# Patient Record
Sex: Male | Born: 1960
Health system: Southern US, Community
[De-identification: ages and names within clinical notes are randomized; demographics above are authoritative.]

## PROBLEM LIST (undated history)

## (undated) DIAGNOSIS — M160 Bilateral primary osteoarthritis of hip: Secondary | ICD-10-CM

## (undated) DIAGNOSIS — D709 Neutropenia, unspecified: Secondary | ICD-10-CM

## (undated) DIAGNOSIS — T7840XA Allergy, unspecified, initial encounter: Secondary | ICD-10-CM

## (undated) DIAGNOSIS — M7061 Trochanteric bursitis, right hip: Secondary | ICD-10-CM

## (undated) DIAGNOSIS — K219 Gastro-esophageal reflux disease without esophagitis: Secondary | ICD-10-CM

## (undated) DIAGNOSIS — M722 Plantar fascial fibromatosis: Secondary | ICD-10-CM

## (undated) DIAGNOSIS — G43909 Migraine, unspecified, not intractable, without status migrainosus: Secondary | ICD-10-CM

## (undated) DIAGNOSIS — M87052 Idiopathic aseptic necrosis of left femur: Secondary | ICD-10-CM

## (undated) DIAGNOSIS — R569 Unspecified convulsions: Secondary | ICD-10-CM

## (undated) DIAGNOSIS — M87051 Idiopathic aseptic necrosis of right femur: Secondary | ICD-10-CM

## (undated) DIAGNOSIS — I639 Cerebral infarction, unspecified: Secondary | ICD-10-CM

## (undated) HISTORY — PX: HERNIA REPAIR: SHX51

## (undated) HISTORY — DX: Plantar fascial fibromatosis: M72.2

## (undated) HISTORY — DX: Unspecified convulsions: R56.9

## (undated) HISTORY — DX: Trochanteric bursitis, right hip: M70.61

## (undated) HISTORY — DX: Idiopathic aseptic necrosis of right femur: M87.052

## (undated) HISTORY — DX: Idiopathic aseptic necrosis of left femur: M87.052

## (undated) HISTORY — DX: Bilateral primary osteoarthritis of hip: M16.0

## (undated) HISTORY — DX: Idiopathic aseptic necrosis of left femur: M87.051

## (undated) HISTORY — DX: Neutropenia, unspecified: D70.9

## (undated) HISTORY — DX: Allergy, unspecified, initial encounter: T78.40XA

## (undated) HISTORY — PX: HIP SURGERY: SHX245

---

## 1998-09-11 ENCOUNTER — Encounter: Admission: RE | Admit: 1998-09-11 | Discharge: 1998-11-22 | Payer: Self-pay

## 1998-12-18 ENCOUNTER — Encounter: Admission: RE | Admit: 1998-12-18 | Discharge: 1999-01-17 | Payer: Self-pay

## 2000-02-18 ENCOUNTER — Encounter: Payer: Self-pay | Admitting: Emergency Medicine

## 2000-02-18 ENCOUNTER — Emergency Department (HOSPITAL_COMMUNITY): Admission: EM | Admit: 2000-02-18 | Discharge: 2000-02-18 | Payer: Self-pay | Admitting: Emergency Medicine

## 2000-03-03 ENCOUNTER — Encounter: Admission: RE | Admit: 2000-03-03 | Discharge: 2000-04-30 | Payer: Self-pay | Admitting: Specialist

## 2006-02-11 ENCOUNTER — Emergency Department (HOSPITAL_COMMUNITY): Admission: EM | Admit: 2006-02-11 | Discharge: 2006-02-11 | Payer: Self-pay | Admitting: Family Medicine

## 2006-02-11 ENCOUNTER — Ambulatory Visit (HOSPITAL_COMMUNITY): Admission: RE | Admit: 2006-02-11 | Discharge: 2006-02-11 | Payer: Self-pay | Admitting: Family Medicine

## 2006-02-11 IMAGING — CR DG ABDOMEN 1V
1 series · 1 of 1 positions shown · non-contrast
Comparison: Report of pelvic film from [DATE].
 ABDOMEN - 1 VIEW:

CLINICAL DATA: Muscle strain and abdominal pain for 8 weeks.

[t abdomen supine]
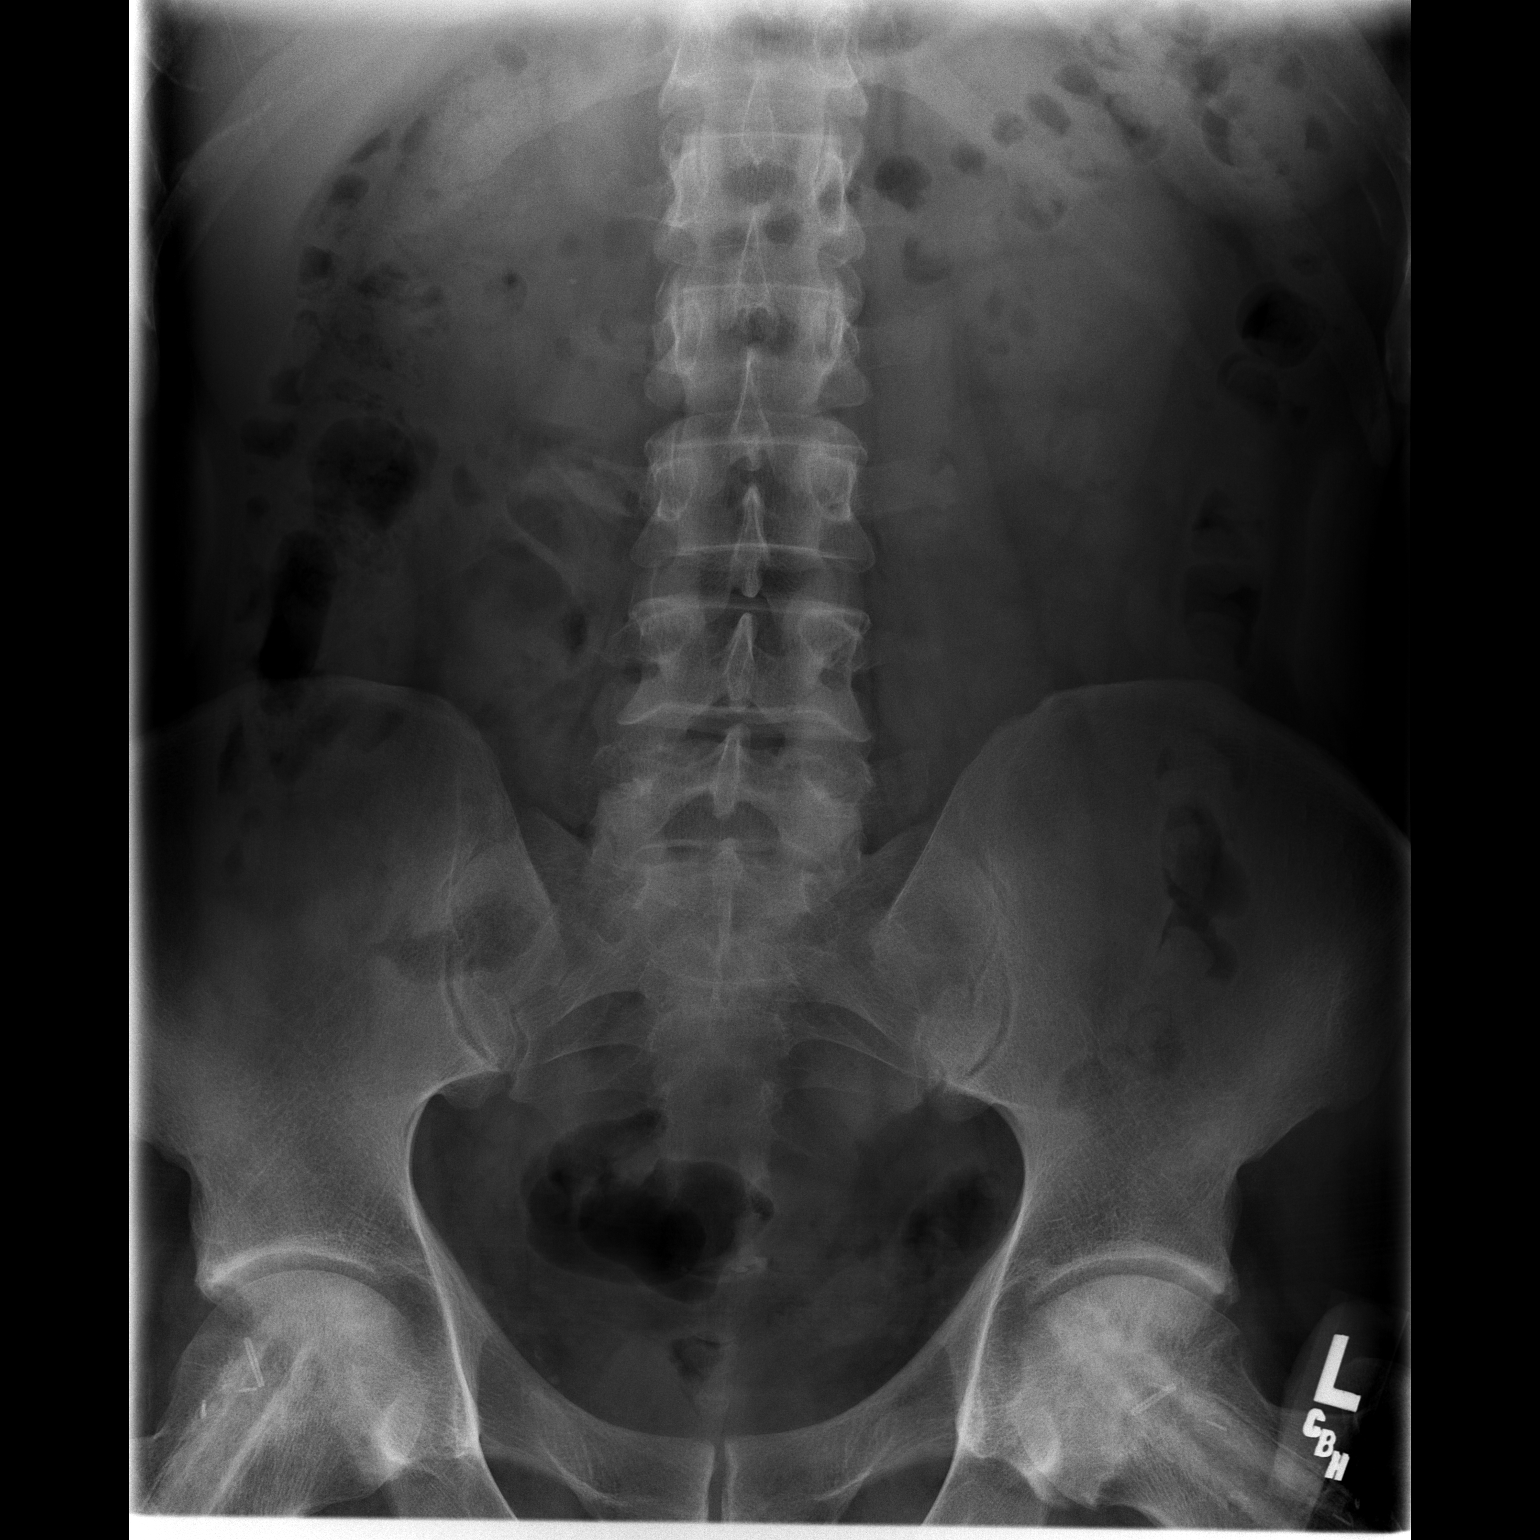

[1 of 1 positions shown; findings below may reference images not displayed]

FINDINGS: Prior surgical changes in the femoral heads bilaterally.  There is increased density in the femoral heads, greater on the left than right, suspicious for avascular necrosis.  No significant collapse identified.  Remainder of osseous structures intact.  Faint calcific density projects over the interpolar right kidney medially, adjacent to the second lumbar interspace.  No other calcific densities over the kidneys or expected course of the ureters.   Bowel gas pattern is within normal limits.
IMPRESSION: 1.  No acute findings.
 2.  Possible punctate stone over the interpolar right kidney.  
 3.  Post surgical changes in the hips with findings suspicious for left greater than right avascular necrosis.

## 2006-02-15 ENCOUNTER — Emergency Department (HOSPITAL_COMMUNITY): Admission: EM | Admit: 2006-02-15 | Discharge: 2006-02-15 | Payer: Self-pay | Admitting: Emergency Medicine

## 2006-03-05 ENCOUNTER — Encounter: Admission: RE | Admit: 2006-03-05 | Discharge: 2006-03-05 | Payer: Self-pay | Admitting: General Surgery

## 2006-03-05 IMAGING — CT CT PELVIS W/ CM
3 of 5 series · 14 of 32 positions shown, 19 images · IV contrast (REDICAT/H2O & OMNIPAQUE [ID])
Comparison: None.

CLINICAL DATA: Abdominal swelling for 12 weeks. 
ABDOMEN CT WITH CONTRAST:
TECHNIQUE: Multidetector CT imaging of the abdomen was performed following the standard protocol during bolus administration of intravenous contrast.
Contrast:  100 cc Omnipaque 300.  Oral contrast was given.
TECHNIQUE: Multidetector CT imaging of the pelvis was performed following the standard protocol during bolus administration of intravenous contrast.

[Series 2: — · axial · 0.70mm/px · z∈[-398,-63]mm · 7 of 91 slices shown, 12 images (1 of 2)]
[im 12/91  soft-tissue]
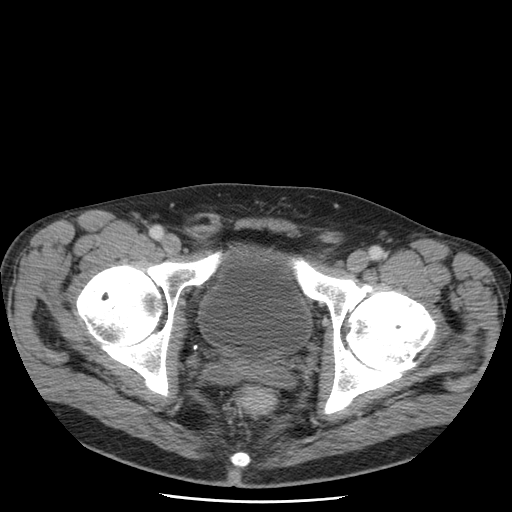
[im 12/91  bone]
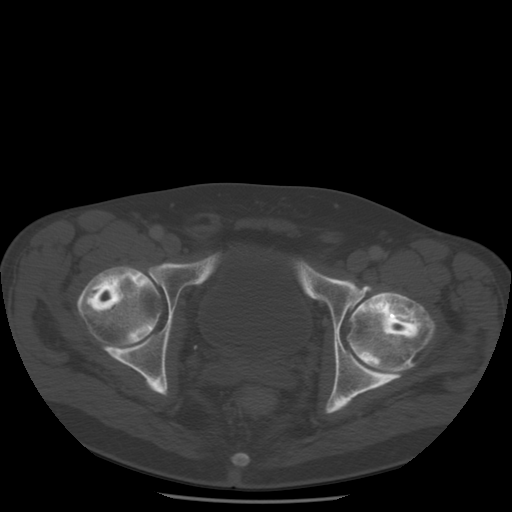
[im 23/91  soft-tissue]
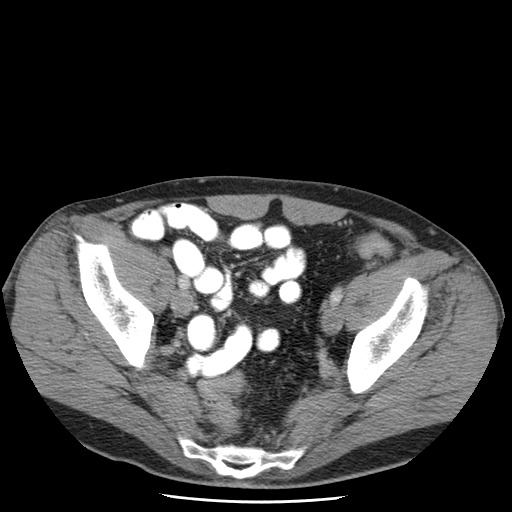
[im 34/91  soft-tissue]
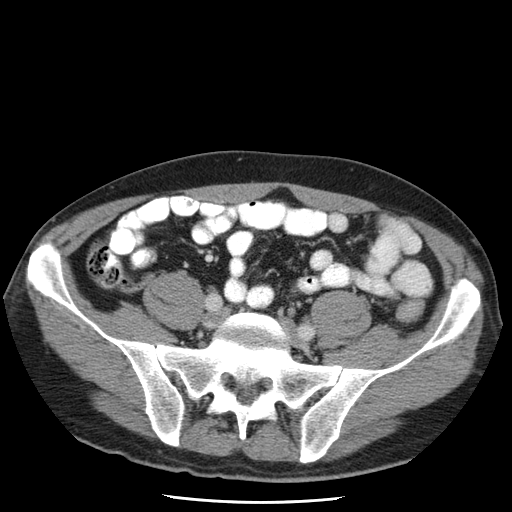
[im 46/91  soft-tissue]
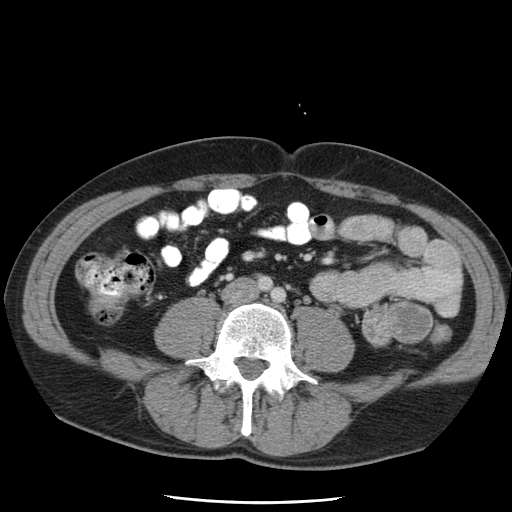
[im 46/91  lung]
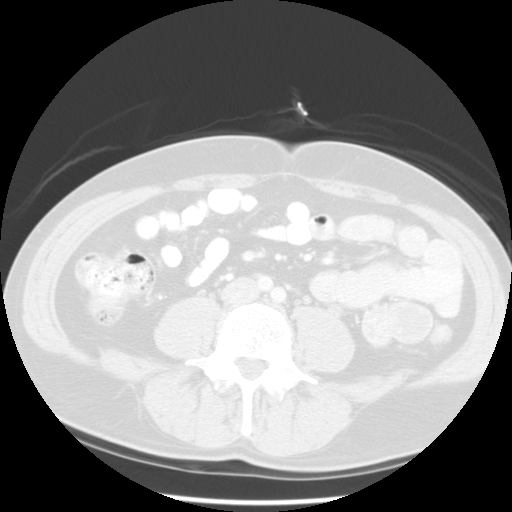
[im 57/91  soft-tissue]
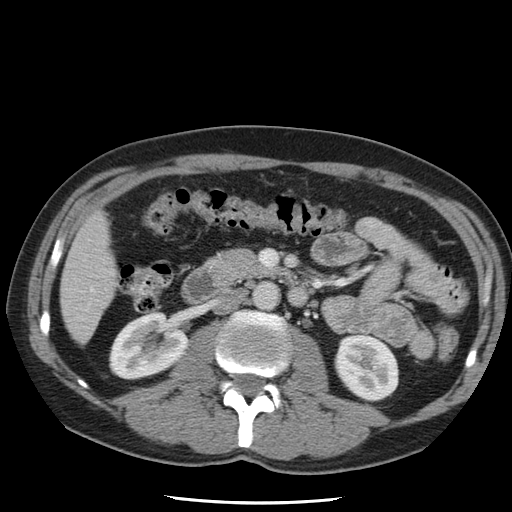
[im 57/91  lung]
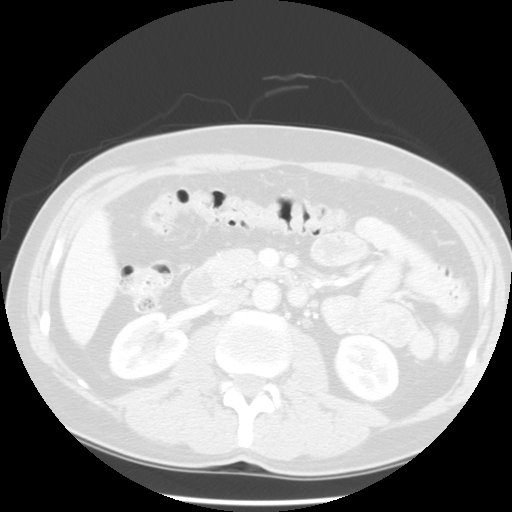
[im 68/91  soft-tissue]
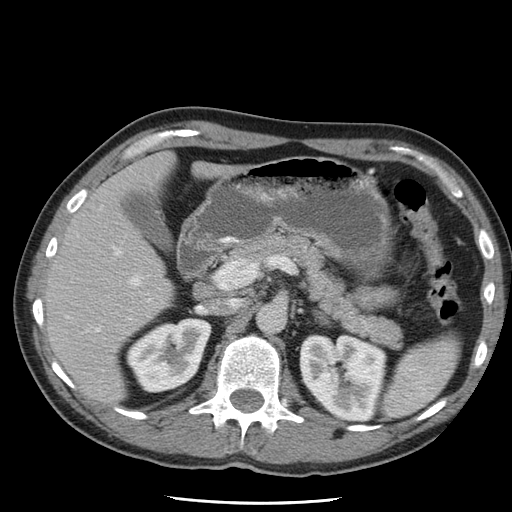
[im 68/91  lung]
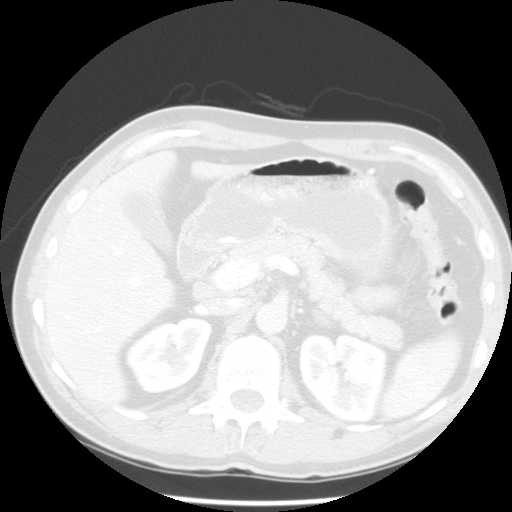
[im 79/91  soft-tissue]
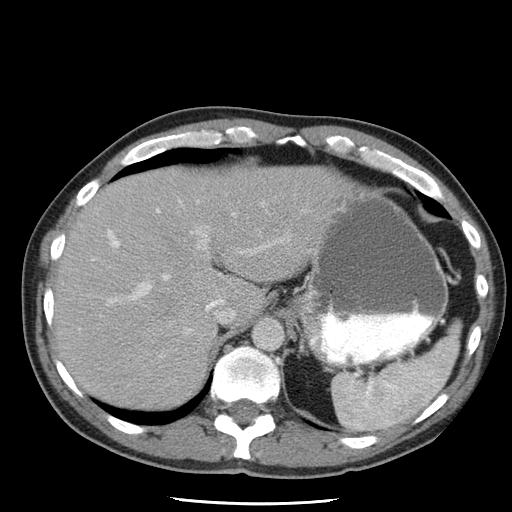
[im 79/91  lung]
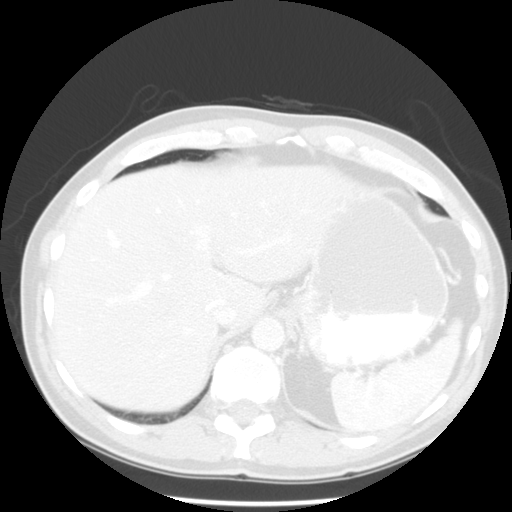

[Series 4: — · axial · 0.70mm/px · z∈[-150,-75]mm · 2 of 46 slices shown (2 of 2)]
[im 16/46  soft-tissue]
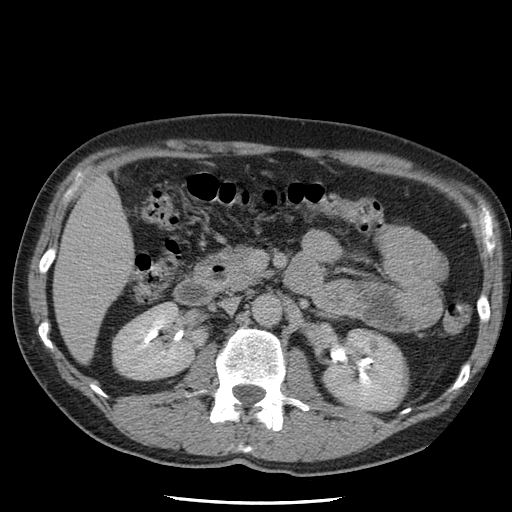
[im 31/46  soft-tissue]
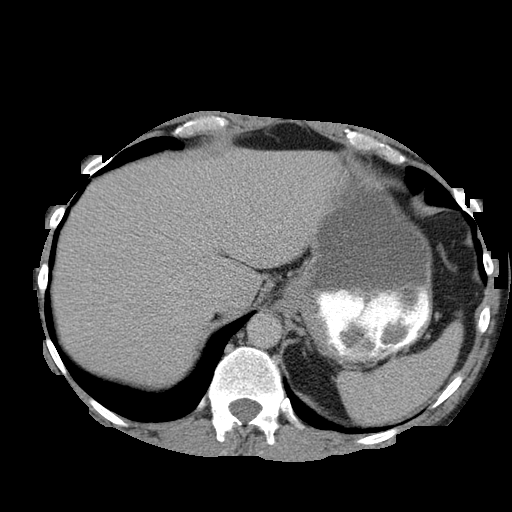

[Series 193: reformatted · sagittal · 0.70mm/px · 5 of 128 slices shown]
[im 12/128  soft-tissue]
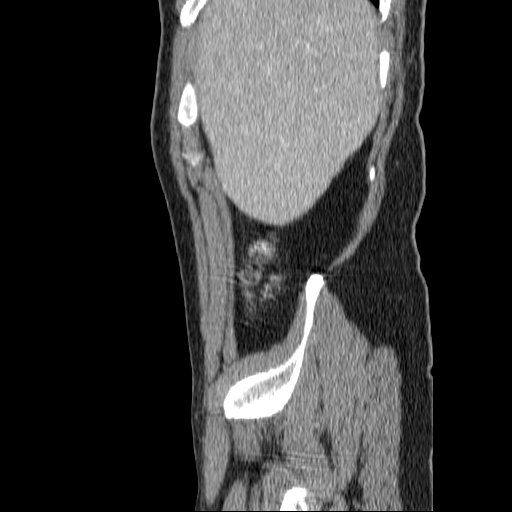
[im 24/128  soft-tissue]
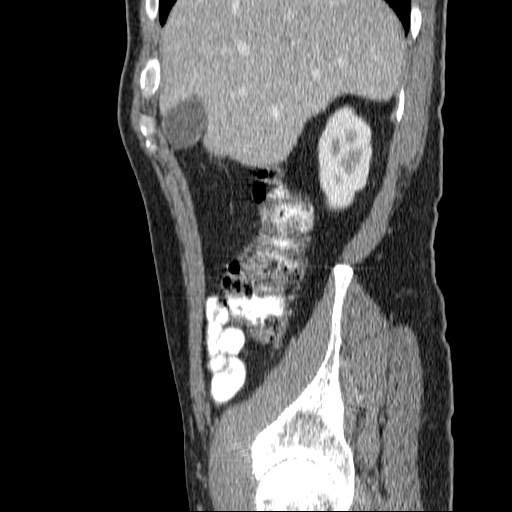
[im 47/128  soft-tissue]
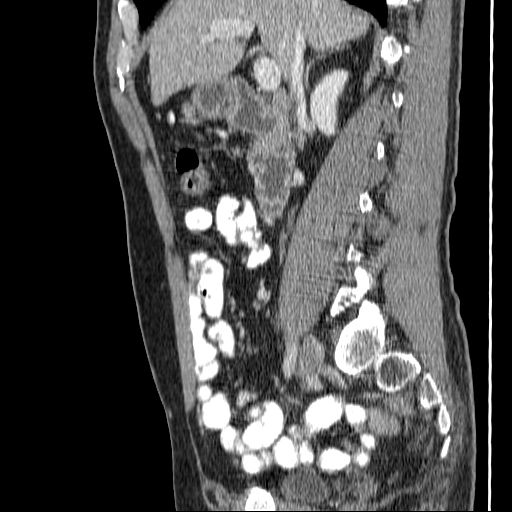
[im 58/128  soft-tissue]
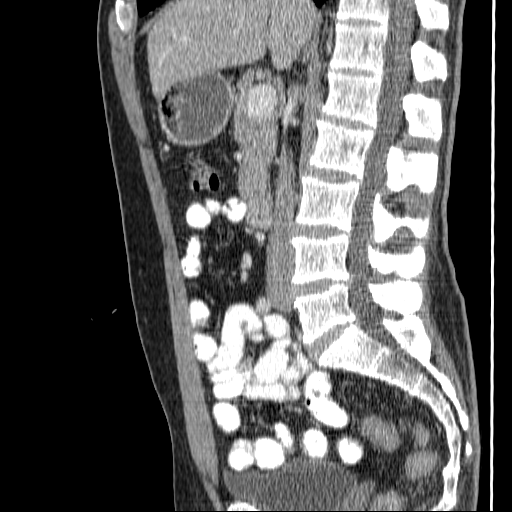
[im 70/128  soft-tissue]
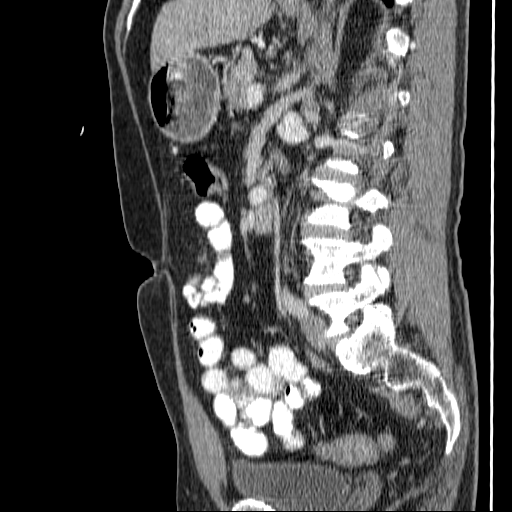

[14 of 32 positions shown; findings below may reference images not displayed]

FINDINGS: The lung bases are clear.  There is no pleural effusion.  The liver appears normal.  The spleen is normal in size without focal abnormality.  The gallbladder, pancreas, adrenal glands, and kidneys appear normal.  No ascites or adenopathy is demonstrated.  The colon is decompressed without evidence of wall thickening and surrounding inflammatory change.  There is no small bowel abnormality.
IMPRESSION: 1.  No explanation for abdominal distention is seen.  There is no bowel dilatation or ascites.  No masses are identified. 
2.  No acute findings. 
PELVIS CT WITH CONTRAST:
FINDINGS: There is no pelvic mass, fluid collection, or inflammatory process.  The prostate gland is mildly enlarged.  There has been apparent free fibular vascular grafting of both femoral heads for osteonecrosis.  No acute osseous findings are seen.
IMPRESSION: No pelvic mass, fluid collection, or inflammatory process.

## 2006-04-09 ENCOUNTER — Ambulatory Visit (HOSPITAL_BASED_OUTPATIENT_CLINIC_OR_DEPARTMENT_OTHER): Admission: RE | Admit: 2006-04-09 | Discharge: 2006-04-09 | Payer: Self-pay | Admitting: General Surgery

## 2008-05-08 ENCOUNTER — Emergency Department (HOSPITAL_COMMUNITY): Admission: EM | Admit: 2008-05-08 | Discharge: 2008-05-08 | Payer: Self-pay | Admitting: Emergency Medicine

## 2008-05-08 IMAGING — CR DG HAND COMPLETE 3+V*L*
3 series · 3 of 3 positions shown · non-contrast
Comparison: None.

CLINICAL DATA: 47-year-old male foreign body palm of the hand.

LEFT HAND - COMPLETE 3+ VIEW

[x hand pa left]
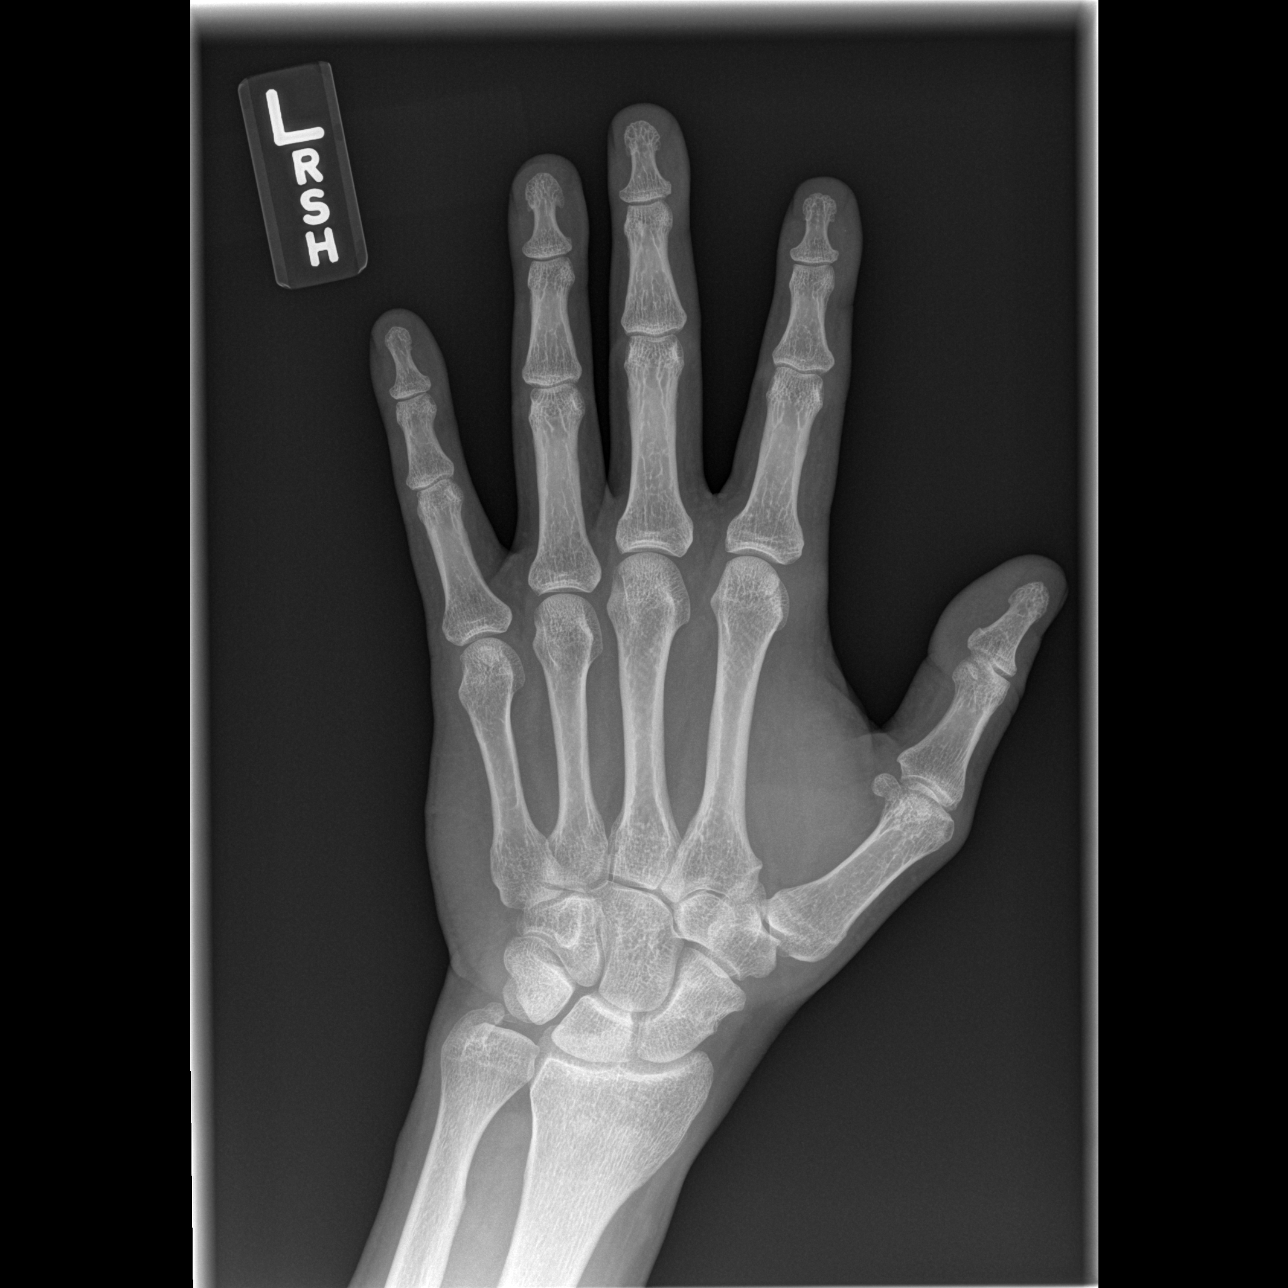

[x hand oblique left]
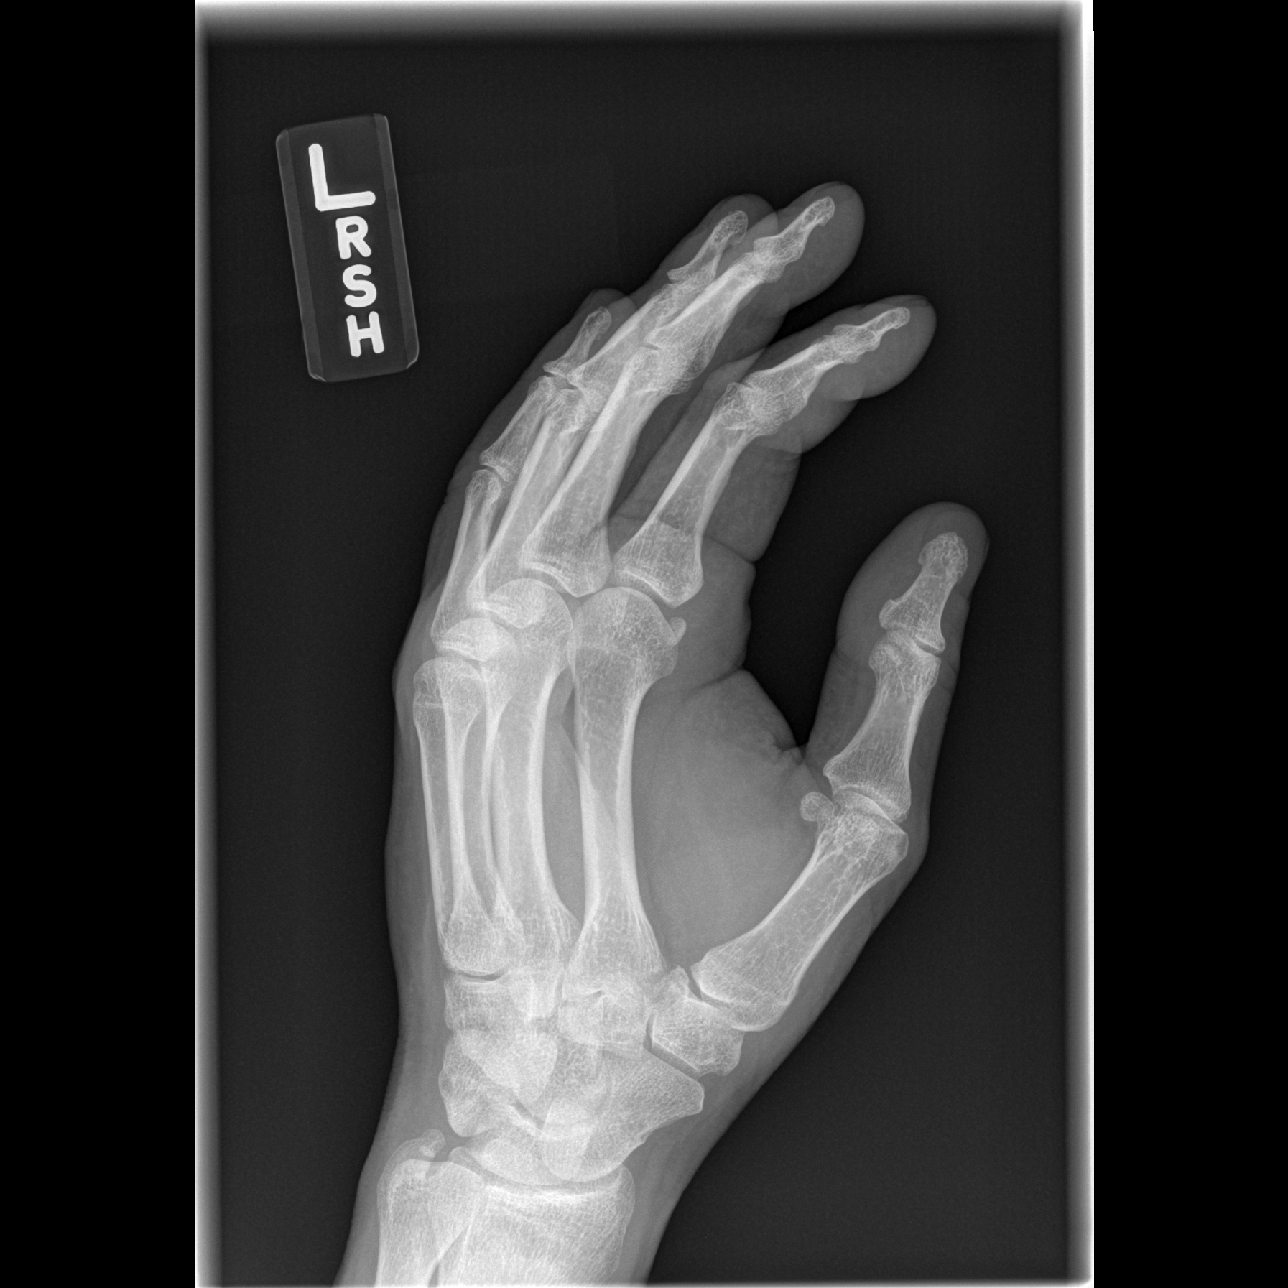

[x hand lat left]
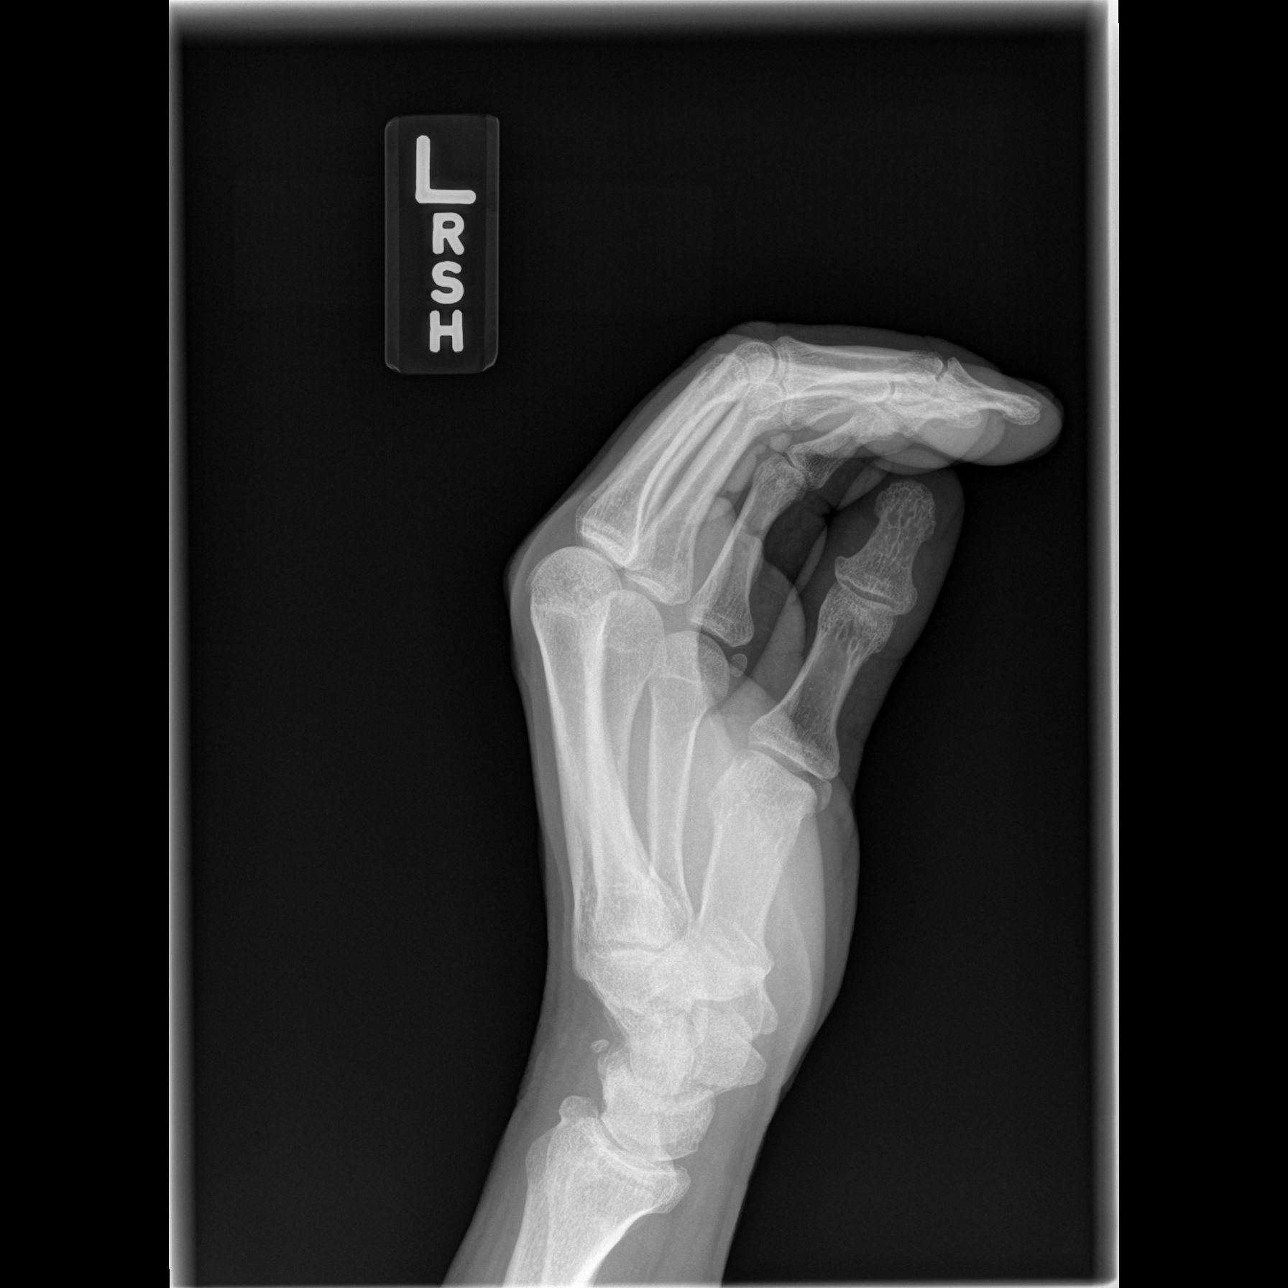

[3 of 3 positions shown; findings below may reference images not displayed]

FINDINGS: Normal alignment without fracture.  No significant
arthritic change.  No definite radiodense foreign body identified
by plain radiography.
IMPRESSION: No acute finding or definite radiodense foreign body by plain
radiography

## 2010-08-26 ENCOUNTER — Encounter: Admission: RE | Admit: 2010-08-26 | Discharge: 2010-08-26 | Payer: Self-pay | Admitting: Emergency Medicine

## 2010-08-26 IMAGING — CT CT TEMPORAL BONES W/O CM
2 of 5 series · 15 of 30 positions shown, 17 images · non-contrast
Comparison: None.

CLINICAL DATA: 49-year-old male with right hearing loss, drainage,
and pain.  Question cholesteatoma, mastoiditis.

CT TEMPORAL BONES WITHOUT CONTRAST
TECHNIQUE: Axial and coronal plane CT imaging of the petrous
temporal bones was performed with thin-collimation image
reconstruction.  No intravenous contrast was administered.
Multiplanar CT image reconstructions were also generated.

[Series 3: ax mag right · axial · 0.19mm/px · z∈[-7,+42]mm · 8 of 203 slices shown, 10 images]
[im 23/203  brain]
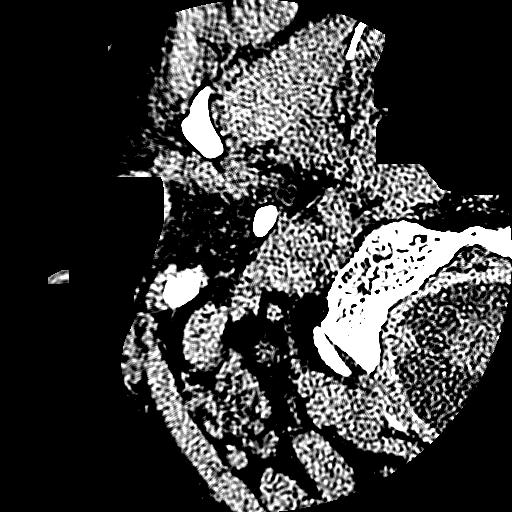
[im 23/203  bone]
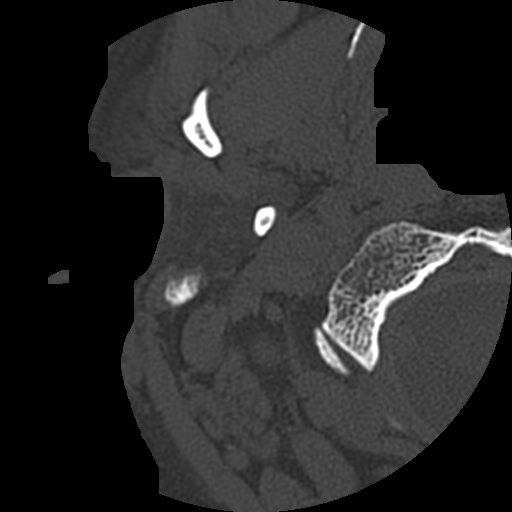
[im 45/203  bone]
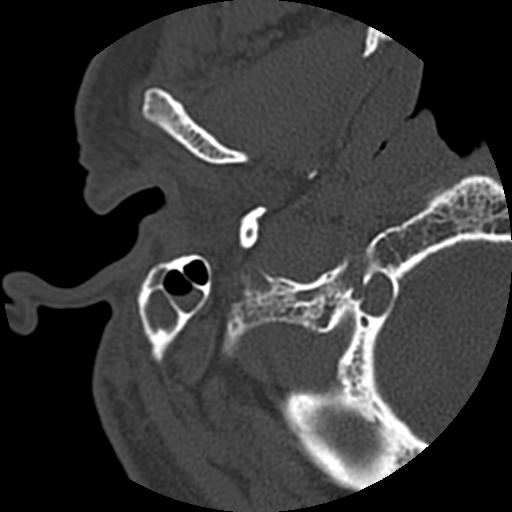
[im 68/203  bone]
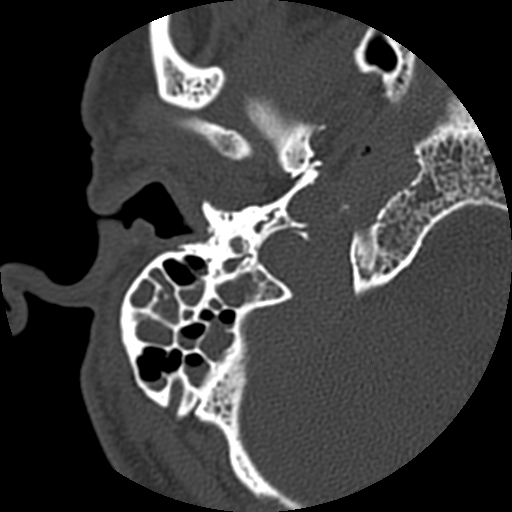
[im 90/203  bone]
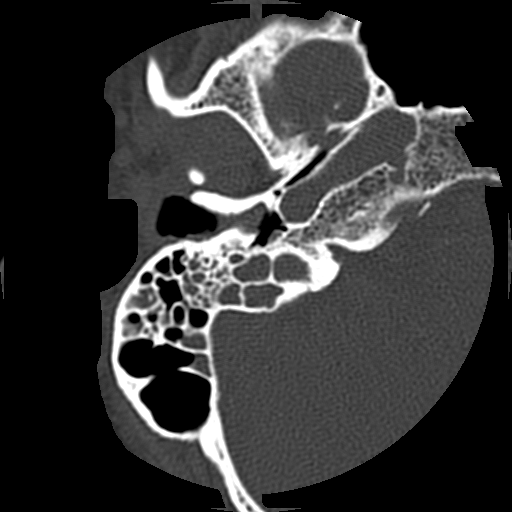
[im 113/203  brain]
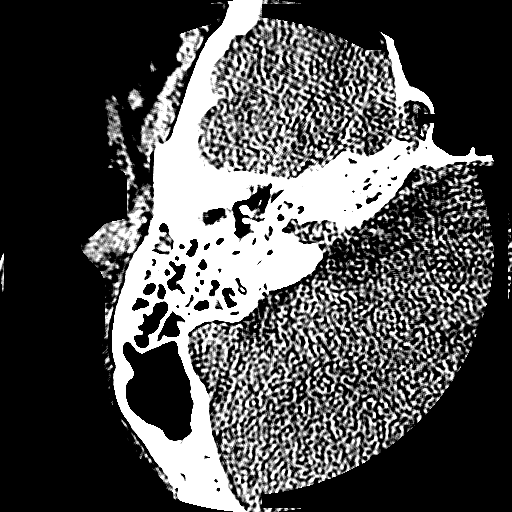
[im 113/203  bone]
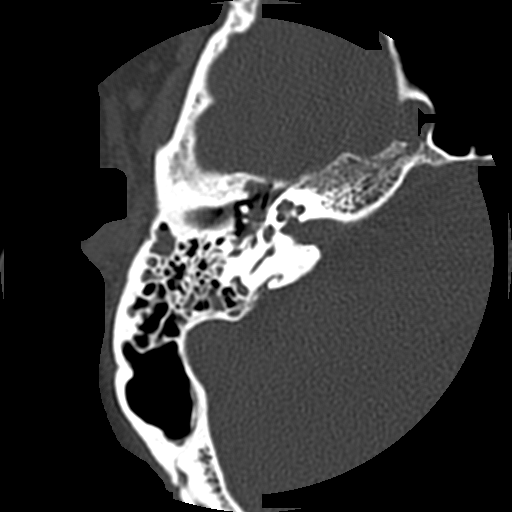
[im 135/203  bone]
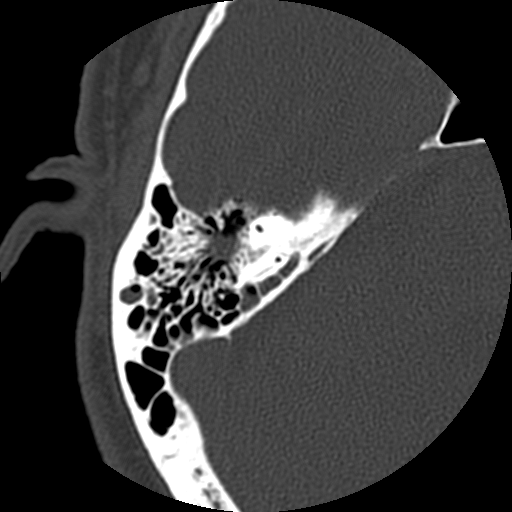
[im 158/203  bone]
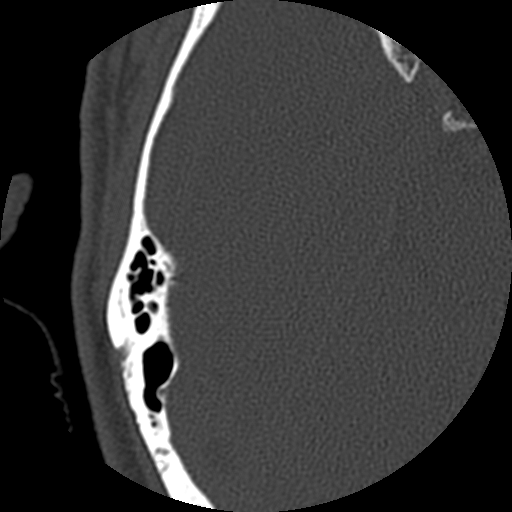
[im 180/203  bone]
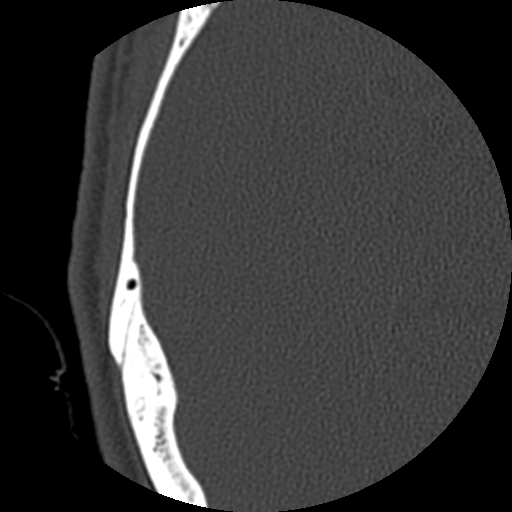

[Series 4: ax mag left · axial · 0.19mm/px · z∈[-6,+42]mm · 7 of 203 slices shown]
[im 26/203  bone]
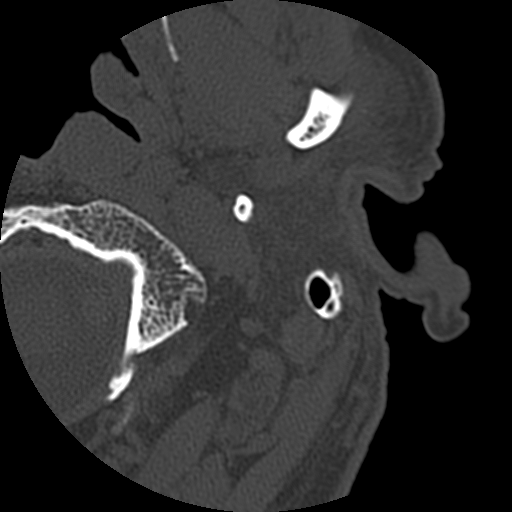
[im 51/203  bone]
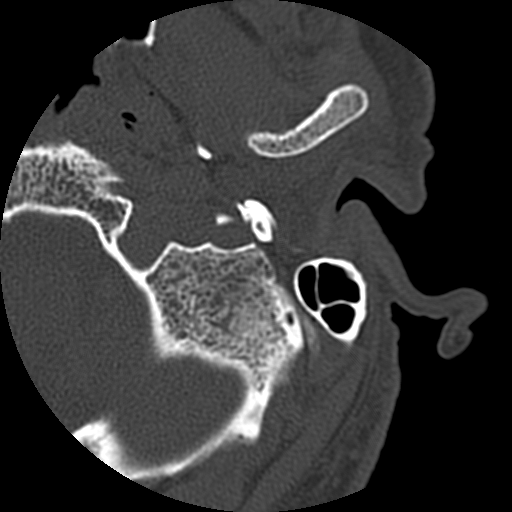
[im 76/203  bone]
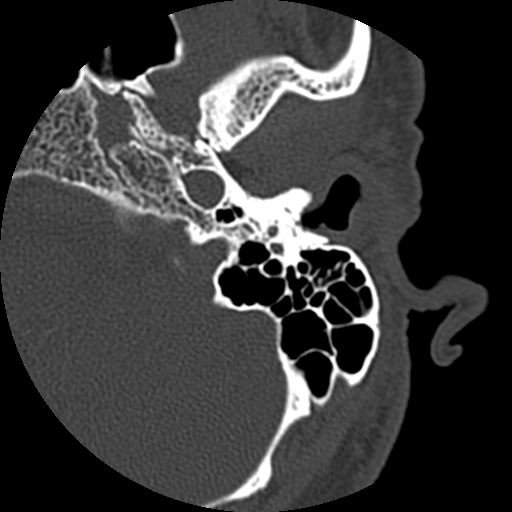
[im 102/203  bone]
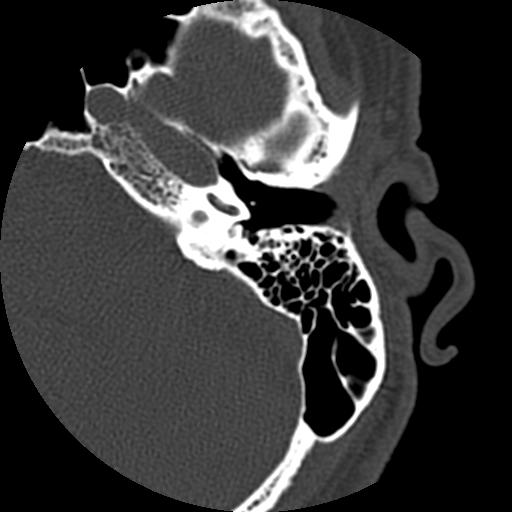
[im 127/203  bone]
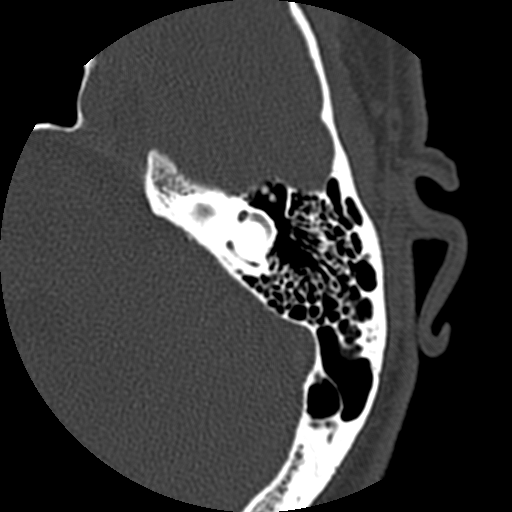
[im 152/203  bone]
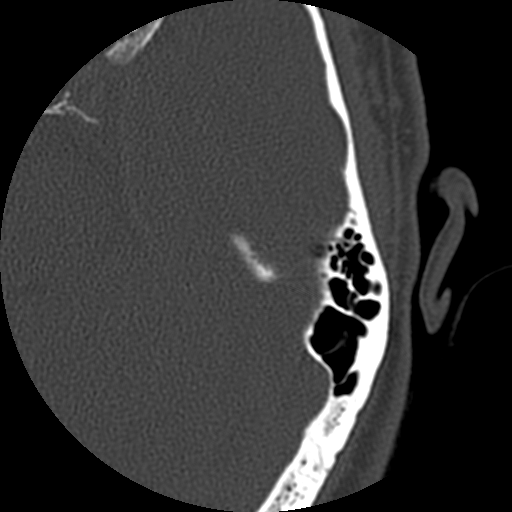
[im 177/203  bone]
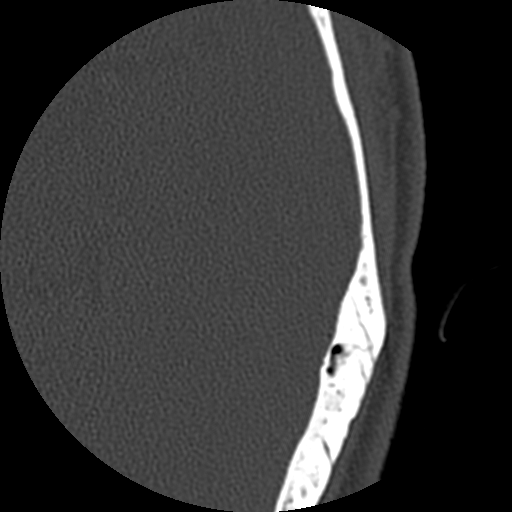

[15 of 30 positions shown; findings below may reference images not displayed]

FINDINGS: Incidental arachnoid granulation associated with the
right transverse sinus.  Visualized noncontrast brain parenchyma
within normal limits.  Visualized deep soft tissue spaces of the
face and orbits are within normal limits.  Visualized paranasal
sinuses are clear.

Left temporal bone:  Left external auditory canal, tympanic
membrane, tympanic cavity, and mastoid air cells are within normal
limits.  The ossicles appear intact, but there is an unusual
lucency at the head of the malleus (series 4 image 122).  The left
internal auditory canal appears bulbous, but similar in
configuration to the opposite side.  The course of the left seventh
nerve, cochlea, vestibule, semicircular canals and vestibular
aqueduct are within normal limits.

Right temporal bone:  There is opacification of the central aspect
of the right external auditory canal.  The right tympanic membrane
is obscured but may be bulging and word.  There is extensive
opacification of the right epitympanum and fluid or soft tissue
tracking medially along the ossicles.  The ossicles appear remain
intact and normally located.  The hypotympanum shows a better
degree of pneumatization.  There is opacification of the aditus ad
antrum and scattered opacification throughout the right mastoids.
Some mastoid air fluid levels are present.  No bony resorption is
identified.  The right sigmoid plate is intact.  The right IAC is
bulbous similar appearance to the left.  The right cochlea,
vestibule, semicircular canals, and vestibular aqueduct are within
normal limits.
IMPRESSION: 1.  Subtotal opacification of the central right external auditory
canal and superior aspect of the right tympanic cavity, obscuring
the right tympanic membrane and involving the right ossicles.
2.  Scattered opacification and fluid levels throughout the right
mastoids.
3.  The findings in #1 and #2 are nonspecific but could reflect
cholesteatoma with associated inflammatory changes or inflammatory
changes alone. Recommend ENT follow-up.
4.  Somewhat bulbous appearance of the bilateral IACs.
5.  Unusual lucency in the head of the left malleus, but otherwise
normal left ossicles and tympanic cavity.

## 2011-04-04 NOTE — Op Note (Signed)
NAMETOSHIRO, HANKEN                ACCOUNT NO.:  [id number]   MEDICAL RECORD NO.:  [id number]          PATIENT TYPE:  AMB   LOCATION:  [facility]                         FACILITY:  [facility]   PHYSICIAN:  Anselm Pancoast. Weatherly, M.D.DATE OF BIRTH:  [birth year]   DATE OF PROCEDURE:  04/09/2006  DATE OF DISCHARGE:                                 OPERATIVE REPORT   PREOPERATIVE DIAGNOSIS:  Right inguinal hernia, probably indirect.   POSTOPERATIVE DIAGNOSIS:  Right inguinal hernia, indirect, with kind of a  weak floor.   OPERATION:  Right inguinal herniorrhaphy with mesh reinforcement.   HISTORY:  Ezekeil Bethel is a 50 year old male with past problems with  arthritis of his hips and he works as a Horticulturist, commercial, walks with a cane  and started having pain in the right groin.  Noticed a bulge.  I saw him  after he had seen in an urgent care and could not see any evidence or feel  any evidence of a hernia at that time.  He had had a CT that showed no  weakness in the inguinal floor area and they had questioned whether or not  he had a kidney stone.  I suggested that we reevaluate him in approximately  4-5 weeks if he is not having any definite further symptoms.  He saw his  regular medical doctor and had no blood in his urine and then he developed a  bulge that would definitely protrude intermittently and I saw him back in  the office this week and could definitely feel an indirect hernia.  It was  fairly easy to reduce and I recommended we repair it with anesthesia at this  time.  The patient is here.  His CBC and EKG and electrolytes were normal.   The patient's groin area was prepped after being clipped with clippers and  previously had been marked.  He had been given a gram of Kefzol and then the  area was prepped with Betadine solution.  The inguinal area was infiltrated  with a mixture of 0.5% plain Xylocaine and 0.25 Marcaine with adrenaline and  then the ilioinguinal nerve area was  infiltrated with about 10 mL of the  same solution with a blunted 22 gauge needle.  A small incision was made.  Sharp dissection through the skin and thin layer of subcutaneous tissue with  two superficial veins were identified, separated or clamped, divided and  ligated with 4-0 Vicryl.  The external oblique aponeurosis was opened  through the external ring and the cord structures were elevated on a  Penrose.  You could see whether there was a little bulge coming down with  the inguinal cord structures.  The floor itself is fairly weak but not  really an obvious symptomatic right indirect inguinal hernia.  I separated  the cremaster and the fibers from the cord structures, identified the hernia  sac, separated it from the cord structures and took it up high, opened the  sac and then a high sac ligation with 0 Surgilon and a second tie just  distally.  Care was taken that  the vessels were not included and then the  sac removed and the high sac ligation retracted up nicely.  The floor itself  I elected to chose this with kind of a modified __________ type starting at  symphysis pubis, reinforcing the shelving edge of the inguinal ligament and  the conjoined tendon area, reinforcing the internal ring and then going  back, suturing the ends together with 2-0 Prolene.  I then took a piece of  mesh.  I made a sail slit laterally and reinforced the floor, sutured it to  the inguinal canal inferior with a running 2-0 Prolene.  The two tails were  sutured together distal to the internal ring area and then the superior flap  was sutured down with interrupted sutures of either 2-0 Prolene or some 2-0  Vicryl.  The ilioinguinal nerve, there appeared to be two, a very small one  with the cord structures that we tried to separate and then a second one  that is quite high.  The mesh is lying just a little bit on top of this  superiorly but I don't think it is causing any irritation to and I was  making  sure that any place we were close to the nerve that the little  tacking suture was a Vicryl instead of a Prolene.  The external oblique was  then closed over the cord structures with a 3-0 running Vicryl.  The mesh  was lying flat, not on excessive tension and I used about 10 mL of the  Marcaine and Xylocaine solution, kind of anesthetizing the floor.  Next,  Scarpa's fascia was closed with some interrupted 3-0 Vicryl, 4-0 Dexon  subcuticular and benzoin and Steri-Strips on the skin.  Testicles were in  normal position.  There was minimal  bleeding and a sterile occlusive dressing was applied after benzoin and  Steri-Strips.  The patient will be released after a short stay and if he can  do light duty at work, he can go back in about three weeks.  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           ______________________________  Anselm Pancoast. Zachery Dakins, M.D.     WJW/MEDQ  D:  04/09/2006  T:  04/09/2006  Job:  [id number]   cc:   Markham Jordan L. Effie Shy, M.D.  Fax: [phone]

## 2012-02-12 DIAGNOSIS — M87052 Idiopathic aseptic necrosis of left femur: Secondary | ICD-10-CM | POA: Insufficient documentation

## 2012-02-12 DIAGNOSIS — M8700 Idiopathic aseptic necrosis of unspecified bone: Secondary | ICD-10-CM | POA: Insufficient documentation

## 2015-11-11 ENCOUNTER — Emergency Department (HOSPITAL_COMMUNITY)
Admission: EM | Admit: 2015-11-11 | Discharge: 2015-11-11 | Disposition: A | Discharge status: Home / Self Care | Payer: Medicare Other | Attending: Emergency Medicine | Admitting: Emergency Medicine

## 2015-11-11 ENCOUNTER — Encounter (HOSPITAL_COMMUNITY): Payer: Self-pay | Admitting: *Deleted

## 2015-11-11 DIAGNOSIS — K219 Gastro-esophageal reflux disease without esophagitis: Secondary | ICD-10-CM | POA: Diagnosis not present

## 2015-11-11 DIAGNOSIS — R42 Dizziness and giddiness: Secondary | ICD-10-CM | POA: Diagnosis present

## 2015-11-11 DIAGNOSIS — Z8679 Personal history of other diseases of the circulatory system: Secondary | ICD-10-CM | POA: Insufficient documentation

## 2015-11-11 DIAGNOSIS — Z79899 Other long term (current) drug therapy: Secondary | ICD-10-CM | POA: Insufficient documentation

## 2015-11-11 HISTORY — DX: Gastro-esophageal reflux disease without esophagitis: K21.9

## 2015-11-11 HISTORY — DX: Migraine, unspecified, not intractable, without status migrainosus: G43.909

## 2015-11-11 LAB — CBC WITH DIFFERENTIAL/PLATELET
Basophils Absolute: 0 10*3/uL (ref 0.0–0.1)
Basophils Relative: 1 %
Eosinophils Absolute: 0 10*3/uL (ref 0.0–0.7)
Eosinophils Relative: 1 %
HCT: 44.7 % (ref 39.0–52.0)
Hemoglobin: 14.9 g/dL (ref 13.0–17.0)
Lymphocytes Relative: 33 %
Lymphs Abs: 1.3 10*3/uL (ref 0.7–4.0)
MCH: 28.4 pg (ref 26.0–34.0)
MCHC: 33.3 g/dL (ref 30.0–36.0)
MCV: 85.3 fL (ref 78.0–100.0)
Monocytes Absolute: 0.5 10*3/uL (ref 0.1–1.0)
Monocytes Relative: 13 %
Neutro Abs: 2.1 10*3/uL (ref 1.7–7.7)
Neutrophils Relative %: 52 %
Platelets: 189 10*3/uL (ref 150–400)
RBC: 5.24 MIL/uL (ref 4.22–5.81)
RDW: 12.8 % (ref 11.5–15.5)
WBC: 3.9 10*3/uL — ABNORMAL LOW (ref 4.0–10.5)

## 2015-11-11 LAB — BASIC METABOLIC PANEL
Anion gap: 4 — ABNORMAL LOW (ref 5–15)
BUN: 11 mg/dL (ref 6–20)
CO2: 31 mmol/L (ref 22–32)
Calcium: 9.7 mg/dL (ref 8.9–10.3)
Chloride: 105 mmol/L (ref 101–111)
Creatinine, Ser: 0.92 mg/dL (ref 0.61–1.24)
GFR calc Af Amer: >60 mL/min (ref >60)
GFR calc non Af Amer: >60 mL/min (ref >60)
Glucose, Bld: 108 mg/dL — ABNORMAL HIGH (ref 65–99)
Potassium: 4.8 mmol/L (ref 3.5–5.1)
Sodium: 140 mmol/L (ref 135–145)

## 2015-11-11 LAB — CBG MONITORING, ED: Glucose-Capillary: 86 mg/dL (ref 65–99)

## 2015-11-11 MED ORDER — MECLIZINE HCL 25 MG PO TABS
25.0000 mg | ORAL_TABLET | Freq: Once | ORAL | Status: AC
Start: 2015-11-11 — End: 2015-11-11
  Administered 2015-11-11: 25 mg via ORAL
  Filled 2015-11-11: qty 1

## 2015-11-11 MED ORDER — MECLIZINE HCL 12.5 MG PO TABS: 12.5000 mg | ORAL_TABLET | Freq: Three times a day (TID) | ORAL | Status: DC | PRN

## 2015-11-11 NOTE — ED Notes (Signed)
Pt verbalized understanding of d/c instructions, prescriptions, and follow-up care. No further questions/concerns, VSS, ambulatory w/ steady gait (refused wheelchair) 

## 2015-11-11 NOTE — ED Provider Notes (Signed)
CSN: [id number]     Arrival date & time 11/11/15  1039 History   First MD Initiated Contact with Patient 11/11/15 1221     Chief Complaint  Patient presents with  . Dizziness     (Consider location/radiation/quality/duration/timing/severity/associated sxs/prior Treatment) HPI.... Sense of room spinning after bending over earlier this morning. No gross neurological deficits. No history of high blood pressure or diabetes. His wife game him dramamine which seemed to help. Note fever, sweats, chills, neuro deficits.  Past Medical History  Diagnosis Date  . GERD (gastroesophageal reflux disease)   . Migraine    Past Surgical History  Procedure Laterality Date  . Hip surgery     No family history on file. Social History  Substance Use Topics  . Smoking status: Never Smoker   . Smokeless tobacco: None  . Alcohol Use: No    Review of Systems  All other systems reviewed and are negative.     Allergies  Review of patient's allergies indicates no known allergies.  Home Medications   Prior to Admission medications   Medication Sig Start Date End Date Taking? Authorizing Provider  ibuprofen (ADVIL,MOTRIN) 100 MG tablet Take 100 mg by mouth every 6 (six) hours as needed for pain or fever.   Yes Historical Provider, MD  ranitidine (ZANTAC) 150 MG tablet Take 150 mg by mouth daily.   Yes Historical Provider, MD  meclizine (ANTIVERT) 12.5 MG tablet Take 1 tablet (12.5 mg total) by mouth 3 (three) times daily as needed for dizziness. 11/11/15   Nat Christen, MD   BP 113/78 mmHg  Pulse 68  Temp(Src) 97.4 F (36.3 C) (Oral)  Resp 15  Ht 6' 3.5" (1.918 m)  Wt 230 lb (104.327 kg)  BMI 28.36 kg/m2  SpO2 96% Physical Exam  Constitutional: He is oriented to person, place, and time. He appears well-developed and well-nourished.  HENT:  Head: Normocephalic and atraumatic.  Eyes: Conjunctivae and EOM are normal. Pupils are equal, round, and reactive to light.  Neck: Normal range of  motion. Neck supple.  Cardiovascular: Normal rate and regular rhythm.   Pulmonary/Chest: Effort normal and breath sounds normal.  Abdominal: Soft. Bowel sounds are normal.  Musculoskeletal: Normal range of motion.  Neurological: He is alert and oriented to person, place, and time.  Skin: Skin is warm and dry.  Psychiatric: He has a normal mood and affect. His behavior is normal.  Nursing note and vitals reviewed.   ED Course  Procedures (including critical care time) Labs Review Labs Reviewed  BASIC METABOLIC PANEL - Abnormal; Notable for the following:    Glucose, Bld 108 (*)    Anion gap 4 (*)    All other components within normal limits  CBC WITH DIFFERENTIAL/PLATELET - Abnormal; Notable for the following:    WBC 3.9 (*)    All other components within normal limits  CBG MONITORING, ED    Imaging Review No results found. I have personally reviewed and evaluated these images and lab results as part of my medical decision-making.   EKG Interpretation None      MDM   Final diagnoses:  Vertigo    No gross neuro deficits. Meclizine seemed to help. Screening labs normal. Discharge medications meclizine 25 mg    Nat Christen, MD 11/11/15 [phone]

## 2015-11-11 NOTE — ED Notes (Signed)
Pt sat up in bed this am, leaned forward and the room began spinning.  States has migraines that make him dizzy, but this is more intense and he does not have a headache.  Took a dramamine and symptoms are some better.

## 2015-11-11 NOTE — Discharge Instructions (Signed)
Benign Positional Vertigo °Vertigo is the feeling that you or your surroundings are moving when they are not. Benign positional vertigo is the most common form of vertigo. The cause of this condition is not serious (is benign). This condition is triggered by certain movements and positions (is positional). This condition can be dangerous if it occurs while you are doing something that could endanger you or others, such as driving.  °CAUSES °In many cases, the cause of this condition is not known. It may be caused by a disturbance in an area of the inner ear that helps your brain to sense movement and balance. This disturbance can be caused by a viral infection (labyrinthitis), head injury, or repetitive motion. °RISK FACTORS °This condition is more likely to develop in: °· Women. °· People who are 50 years of age or older. °SYMPTOMS °Symptoms of this condition usually happen when you move your head or your eyes in different directions. Symptoms may start suddenly, and they usually last for less than a minute. Symptoms may include: °· Loss of balance and falling. °· Feeling like you are spinning or moving. °· Feeling like your surroundings are spinning or moving. °· Nausea and vomiting. °· Blurred vision. °· Dizziness. °· Involuntary eye movement (nystagmus). °Symptoms can be mild and cause only slight annoyance, or they can be severe and interfere with daily life. Episodes of benign positional vertigo may return (recur) over time, and they may be triggered by certain movements. Symptoms may improve over time. °DIAGNOSIS °This condition is usually diagnosed by medical history and a physical exam of the head, neck, and ears. You may be referred to a health care provider who specializes in ear, nose, and throat (ENT) problems (otolaryngologist) or a provider who specializes in disorders of the nervous system (neurologist). You may have additional testing, including: °· MRI. °· A CT scan. °· Eye movement tests. Your  health care provider may ask you to change positions quickly while he or she watches you for symptoms of benign positional vertigo, such as nystagmus. Eye movement may be tested with an electronystagmogram (ENG), caloric stimulation, the Dix-Hallpike test, or the roll test. °· An electroencephalogram (EEG). This records electrical activity in your brain. °· Hearing tests. °TREATMENT °Usually, your health care provider will treat this by moving your head in specific positions to adjust your inner ear back to normal. Surgery may be needed in severe cases, but this is rare. In some cases, benign positional vertigo may resolve on its own in 2-4 weeks. °HOME CARE INSTRUCTIONS °Safety °· Move slowly. Avoid sudden body or head movements. °· Avoid driving. °· Avoid operating heavy machinery. °· Avoid doing any tasks that would be dangerous to you or others if a vertigo episode would occur. °· If you have trouble walking or keeping your balance, try using a cane for stability. If you feel dizzy or unstable, sit down right away. °· Return to your normal activities as told by your health care provider. Ask your health care provider what activities are safe for you. °General Instructions °· Take over-the-counter and prescription medicines only as told by your health care provider. °· Avoid certain positions or movements as told by your health care provider. °· Drink enough fluid to keep your urine clear or pale yellow. °· Keep all follow-up visits as told by your health care provider. This is important. °SEEK MEDICAL CARE IF: °· You have a fever. °· Your condition gets worse or you develop new symptoms. °· Your family or friends   notice any behavioral changes.  Your nausea or vomiting gets worse.  You have numbness or a "pins and needles" sensation. SEEK IMMEDIATE MEDICAL CARE IF:  You have difficulty speaking or moving.  You are always dizzy.  You faint.  You develop severe headaches.  You have weakness in your  legs or arms.  You have changes in your hearing or vision.  You develop a stiff neck.  You develop sensitivity to light.   This information is not intended to replace advice given to you by your health care provider. Make sure you discuss any questions you have with your health care provider.   Document Released: 08/11/2006 Document Revised: 07/25/2015 Document Reviewed: 02/26/2015 Elsevier Interactive Patient Education 2016 [employer]  Description for dizziness medication. Rest. Labs were normal.

## 2017-01-23 ENCOUNTER — Ambulatory Visit (INDEPENDENT_AMBULATORY_CARE_PROVIDER_SITE_OTHER): Payer: Medicare Other | Admitting: Physician Assistant

## 2017-01-23 VITALS — BP 128/80 | HR 91 | Temp 98.0°F | Resp 18 | Ht 75.5 in | Wt 211.0 lb

## 2017-01-23 DIAGNOSIS — H6122 Impacted cerumen, left ear: Secondary | ICD-10-CM | POA: Diagnosis not present

## 2017-01-23 DIAGNOSIS — J069 Acute upper respiratory infection, unspecified: Secondary | ICD-10-CM

## 2017-01-23 DIAGNOSIS — R42 Dizziness and giddiness: Secondary | ICD-10-CM

## 2017-01-23 NOTE — Patient Instructions (Addendum)
Make sure you are drinking 64oz water a day. Follow up with your ENT to get your ear cleaned out. Discontinue the Mucniex-Dm. Follow up with [place] if you aren't feeling better. You can also send [place] a message on my chat with the link below.   [email or web address]   IF you received an x-ray today, you will receive an invoice from [facility]. Please contact [facility] at [phone] with questions or concerns regarding your invoice.   IF you received labwork today, you will receive an invoice from [facility]. Please contact [facility] at [phone] with questions or concerns regarding your invoice.   Our billing staff will not be able to assist you with questions regarding bills from these companies.  You will be contacted with the lab results as soon as they are available. The fastest way to get your results is to activate your My Chart account. Instructions are located on the last page of this paperwork. If you have not heard from [place] regarding the results in 2 weeks, please contact this office.

## 2017-01-23 NOTE — Progress Notes (Signed)
Patient ID: Ralph Simpson, male     DOB: [birth year], 58 y.o.    MRN: [id number]  PCP: No primary care provider on file.  Chief Complaint  Patient presents with  . Dizziness    x5 days  . Cough    x5 days been taking OTC medication if no relief    Subjective:   Presents for evaluation of cough and dizziness for the last week. Thursday (01/15/2017) he developed a cough, chills and began to feel unwell. Sunday (01/18/2017) he visited, [facility]. The prescribed him an inhaler, cough medicine and something else (he can't remember) and was told he had a virus. Today his cough is better and he no longer feels chills.he "probabaly" had a fever but didn't have a thermometer at the start of his illiness. He has since bought one but not measured a fever. He does state he is dizzy and feeling "light headed" This lightheaded feeling has been there for a while and he's just feeling "not well" He has had a syncope episode 20 years ago but doesn't remember much from the event.   Denies nausea, vomiting or diarrhea.  Review of Systems   Prior to Admission medications   Medication Sig Start Date End Date Taking? Authorizing Provider  dextromethorphan-guaiFENesin (MUCINEX DM) 30-600 MG 12hr tablet Take 1 tablet by mouth 2 (two) times daily.   Yes Historical Provider, MD  ranitidine (ZANTAC) 150 MG tablet Take 150 mg by mouth daily.    Historical Provider, MD     No Known Allergies   There are no active problems to display for this patient.    History reviewed. No pertinent family history.   Social History   Social History  . Marital status: Single    Spouse name: N/A  . Number of children: N/A  . Years of education: N/A   Occupational History  . Not on file.   Social History Main Topics  . Smoking status: Never Smoker  . Smokeless tobacco: Never Used  . Alcohol use No  . Drug use: No  . Sexual activity: Not on file   Other Topics Concern  . Not on  file   Social History Narrative  . No narrative on file         Objective:  Physical Exam  Constitutional: He is oriented to person, place, and time. He appears well-developed and well-nourished.  Blood pressure 128/80, pulse 91, temperature 98 F (36.7 C), temperature source Oral, resp. rate 18, height 6' 3.5" (1.918 m), weight 211 lb (95.7 kg), SpO2 96 %.  HENT:  Head: Normocephalic.  Right Ear: External ear normal.  Left Ear: External ear normal.  Ears:  Nose: Rhinorrhea present.  Mouth/Throat: Uvula is midline, oropharynx is clear and moist and mucous membranes are normal.  Eyes: Pupils are equal, round, and reactive to light.  Neck: Normal range of motion.  Cardiovascular: Normal rate, regular rhythm and normal pulses.  Exam reveals distant heart sounds. Exam reveals no gallop and no friction rub.   Pulmonary/Chest: Effort normal and breath sounds normal.  Neurological: He is alert and oriented to person, place, and time.  Skin: Skin is warm and dry.  Psychiatric: He has a normal mood and affect. His behavior is normal. Judgment and thought content normal.       Assessment & Plan:  1. Influenza with respiratory manifestation Supportive care and follow up  2. Dizzy ENT follow-up to have the cerumen removed

## 2017-01-29 NOTE — Progress Notes (Signed)
Ralph Simpson  MRN: [id number] DOB: 11/05/1961  PCP: No primary care provider on file.  Chief Complaint  Patient presents with  . Dizziness    x5 days  . Cough    x5 days been taking OTC medication if no relief    Subjective:  Pt presents to clinic for cold symptoms with additional dizziness and dry cough.  He started with symptoms a week aho and was seen on 3/4 at [facility] in [place] - he was told he had a virus and given cough medication an albuterol inhaler and atrovent nasal spray.  The cough is improved but he still does not feel back to normal.  The chills have resolved he is unsure but he still has a subjective fever.  He has had vertigo in the past and this is not that - but he feels unsteady esp with change in positions.  He has had decrease fluid and food intake since the illness started - his urine is darker in color.  He is still more tried that normal.     Review of Systems  Constitutional: Positive for fatigue and fever (subbjective). Negative for chills.  HENT: Positive for congestion. Negative for postnasal drip.   Respiratory: Positive for cough (dry). Negative for shortness of breath and wheezing.   Gastrointestinal: Negative.  Negative for abdominal pain, diarrhea, nausea and vomiting.  Musculoskeletal: Positive for myalgias.  Neurological: Positive for light-headedness. Negative for dizziness (no vertigo) and weakness.    Patient Active Problem List   Diagnosis Date Noted  . Avascular necrosis of bones of both hips ([place]) 02/12/2012    Current Outpatient Prescriptions on File Prior to Visit  Medication Sig Dispense Refill  . ranitidine (ZANTAC) 150 MG tablet Take 150 mg by mouth daily.     No current facility-administered medications on file prior to visit.     No Known Allergies  Pt patients past, family and social history were reviewed and updated.   Objective:  BP 128/80   Pulse 91   Temp 98 F (36.7 C) (Oral)   Resp 18   Ht 6' 3.5" (1.918  m)   Wt 211 lb (95.7 kg)   SpO2 96%   BMI 26.03 kg/m   Physical Exam  Constitutional: He is oriented to person, place, and time and well-developed, well-nourished, and in no distress.  HENT:  Head: Normocephalic and atraumatic.  Right Ear: Hearing, tympanic membrane, external ear and ear canal normal.  Left Ear: Hearing, tympanic membrane and external ear normal. A foreign body (cerumen impaction) is present.  Nose: Mucosal edema (pale) present.  Mouth/Throat: Uvula is midline, oropharynx is clear and moist and mucous membranes are normal.  Eyes: Conjunctivae and EOM are normal. Right eye exhibits no nystagmus. Left eye exhibits no nystagmus.  Neck: Normal range of motion.  Cardiovascular: Normal rate, regular rhythm and normal heart sounds.   Pulmonary/Chest: Effort normal and breath sounds normal. He has no wheezes.  Lymphadenopathy:       Head (right side): No tonsillar adenopathy present.       Head (left side): No tonsillar adenopathy present.    He has no cervical adenopathy.       Right: No supraclavicular adenopathy present.       Left: No supraclavicular adenopathy present.  Neurological: He is alert and oriented to person, place, and time.  Walks with a cane.  Skin: Skin is warm and dry.  Psychiatric: Mood, memory, affect and judgment normal.  Assessment and Plan :  URI with cough and congestion - continue symptomatic care - likely the patient had the flu and is still in the recovery window for that - he will work on increased oral hydration and continue the medication given at his appt 4 days ago.  Expect his energy to continue to improve as the days past.  Dizzy - likely multifactorial - decrease oral hydration due to URI, cerumen impaction - pt to watch and wait and RTC if symptoms worsen.  Impacted cerumen of left ear - pt has had before and sees an ENT for suction removal.  Windell Hummingbird PA-C  [facility] 01/29/2017 11:34 AM

## 2017-06-22 DIAGNOSIS — H6123 Impacted cerumen, bilateral: Secondary | ICD-10-CM | POA: Insufficient documentation

## 2018-01-06 DIAGNOSIS — M7061 Trochanteric bursitis, right hip: Secondary | ICD-10-CM | POA: Insufficient documentation

## 2020-01-27 ENCOUNTER — Ambulatory Visit (INDEPENDENT_AMBULATORY_CARE_PROVIDER_SITE_OTHER): Payer: Medicare Other | Admitting: Internal Medicine

## 2020-01-27 ENCOUNTER — Encounter: Payer: Self-pay | Admitting: Internal Medicine

## 2020-01-27 ENCOUNTER — Other Ambulatory Visit: Payer: Self-pay

## 2020-01-27 VITALS — BP 124/76 | HR 78 | Temp 98.2°F | Wt 185.0 lb

## 2020-01-27 DIAGNOSIS — M8700 Idiopathic aseptic necrosis of unspecified bone: Secondary | ICD-10-CM

## 2020-01-27 DIAGNOSIS — G43C1 Periodic headache syndromes in child or adult, intractable: Secondary | ICD-10-CM

## 2020-01-27 DIAGNOSIS — R7303 Prediabetes: Secondary | ICD-10-CM

## 2020-01-27 DIAGNOSIS — K219 Gastro-esophageal reflux disease without esophagitis: Secondary | ICD-10-CM

## 2020-01-27 DIAGNOSIS — G43909 Migraine, unspecified, not intractable, without status migrainosus: Secondary | ICD-10-CM | POA: Insufficient documentation

## 2020-01-27 LAB — POCT GLYCOSYLATED HEMOGLOBIN (HGB A1C): Hemoglobin A1C: 5.7 % — AB (ref 4.0–5.6)

## 2020-01-27 NOTE — Patient Instructions (Signed)

## 2020-01-27 NOTE — Progress Notes (Signed)
HPI  Pt presents to the clinic today to establish care and for management of the conditions listed below. He has not had a PCP in many years.  AVN, Bilateral Hips: s/p bone grafting. He follows with [facility]. Currently on disability due to this.  GERD: He is not sure what triggers this. He takes Copywriter, advertising with good relief. He has never had an upper endoscopy.  Migraines: These occur during the change of the seasons. He takes Excedrin Migraine and lays in a dark room with some relief.  Prediabetes: He checks his sugars intermittently. He is not taking any oral hypoglycemic agents at this time. He would like this rechecked today.  Flu: never Tetanus: 01/2020 Shingrix: never Covid: never PSA Screening: never Colon Screening: never Vision Screening: as needed Dentist: biannually  Past Medical History:  Diagnosis Date  . Allergy   . GERD (gastroesophageal reflux disease)   . Migraine     Current Outpatient Medications  Medication Sig Dispense Refill  . Misc Natural Products (BLOOD SUGAR BALANCE) TABS Take by mouth.    . Multiple Vitamins-Minerals (VITEYES AREDS FORMULA/LUTEIN PO) Take by mouth.    Marland Kitchen OVER THE COUNTER MEDICATION Reishi Mushroom    . PANCREATIN PO Take by mouth.     No current facility-administered medications for this visit.    No Known Allergies  Family History  Problem Relation Age of Onset  . Arthritis Mother   . Stroke Mother   . Diabetes Father   . Heart disease Father   . Hyperlipidemia Father   . Hypertension Father   . Kidney disease Maternal Grandfather     Social History   Socioeconomic History  . Marital status: Single    Spouse name: Not on file  . Number of children: Not on file  . Years of education: Not on file  . Highest education level: Not on file  Occupational History  . Not on file  Tobacco Use  . Smoking status: Never Smoker  . Smokeless tobacco: Never Used  Substance and Sexual Activity  . Alcohol use: Yes   Comment: occasional  . Drug use: No  . Sexual activity: Not on file  Other Topics Concern  . Not on file  Social History Narrative  . Not on file   Social Determinants of Health   Financial Resource Strain:   . Difficulty of Paying Living Expenses:   Food Insecurity:   . Worried About Charity fundraiser in the Last Year:   . Arboriculturist in the Last Year:   Transportation Needs:   . Film/video editor (Medical):   Marland Kitchen Lack of Transportation (Non-Medical):   Physical Activity:   . Days of Exercise per Week:   . Minutes of Exercise per Session:   Stress:   . Feeling of Stress :   Social Connections:   . Frequency of Communication with Friends and Family:   . Frequency of Social Gatherings with Friends and Family:   . Attends Religious Services:   . Active Member of Clubs or Organizations:   . Attends Archivist Meetings:   Marland Kitchen Marital Status:   Intimate Partner Violence:   . Fear of Current or Ex-Partner:   . Emotionally Abused:   Marland Kitchen Physically Abused:   . Sexually Abused:     ROS:  Constitutional: Pt reports intermittent headaches, weight loss. Denies fever, malaise, fatigue.  HEENT: Denies eye pain, eye redness, ear pain, ringing in the ears, wax buildup, runny  nose, nasal congestion, bloody nose, or sore throat. Respiratory: Denies difficulty breathing, shortness of breath, cough or sputum production.   Cardiovascular: Denies chest pain, chest tightness, palpitations or swelling in the hands or feet.  Gastrointestinal: Pt reports intermittent reflux. Denies abdominal pain, bloating, constipation, diarrhea or blood in the stool.  GU: Denies frequency, urgency, pain with urination, blood in urine, odor or discharge. Musculoskeletal: Pt reports bilateral hip pain, difficulty with gait. Denies difficulty with gait, muscle pain or joint swelling.  Skin: Denies redness, rashes, lesions or ulcercations.  Neurological: Denies dizziness, difficulty with memory,  difficulty with speech or problems with balance and coordination.  Psych: Denies anxiety, depression, SI/HI.  No other specific complaints in a complete review of systems (except as listed in HPI above).  PE:  BP 124/76   Pulse 78   Temp 98.2 F (36.8 C) (Temporal)   Wt 185 lb (83.9 kg)   SpO2 98%   BMI 22.82 kg/m   Wt Readings from Last 3 Encounters:  01/23/17 211 lb (95.7 kg)  11/11/15 230 lb (104.3 kg)    General: Appears his stated age, well developed, well nourished in NAD. HEENT: Head: normal shape and size; Eyes: sclera white, no icterus, conjunctiva pink, PERRLA and EOMs intact;  Cardiovascular: Normal rate and rhythm. S1,S2 noted.  No murmur, rubs or gallops noted.  Pulmonary/Chest: Normal effort and positive vesicular breath sounds. No respiratory distress. No wheezes, rales or ronchi noted.  Abdomen: Soft and nontender. Normal bowel sounds. Musculoskeletal: Gait slow and steady with use of cane. Neurological: Alert and oriented. Psychiatric: Mood and affect normal. Behavior is normal. Judgment and thought content normal.     BMET    Component Value Date/Time   NA 140 11/11/2015 1340   K 4.8 11/11/2015 1340   CL 105 11/11/2015 1340   CO2 31 11/11/2015 1340   GLUCOSE 108 (H) 11/11/2015 1340   BUN 11 11/11/2015 1340   CREATININE 0.92 11/11/2015 1340   CALCIUM 9.7 11/11/2015 1340   GFRNONAA >60 11/11/2015 1340   GFRAA >60 11/11/2015 1340    Lipid Panel  No results found for: CHOL, TRIG, HDL, CHOLHDL, VLDL, LDLCALC  CBC    Component Value Date/Time   WBC 3.9 (L) 11/11/2015 1340   RBC 5.24 11/11/2015 1340   HGB 14.9 11/11/2015 1340   HCT 44.7 11/11/2015 1340   PLT 189 11/11/2015 1340   MCV 85.3 11/11/2015 1340   MCH 28.4 11/11/2015 1340   MCHC 33.3 11/11/2015 1340   RDW 12.8 11/11/2015 1340   LYMPHSABS 1.3 11/11/2015 1340   MONOABS 0.5 11/11/2015 1340   EOSABS 0.0 11/11/2015 1340   BASOSABS 0.0 11/11/2015 1340    Hgb A1C No results found for:  HGBA1C   Assessment and Plan:

## 2020-01-27 NOTE — Assessment & Plan Note (Signed)
Follows with [facility] Considering surgery

## 2020-01-27 NOTE — Assessment & Plan Note (Signed)
POCT A1C today

## 2020-01-27 NOTE — Assessment & Plan Note (Signed)
Continue Excedrin Migraine Will monitor

## 2020-01-27 NOTE — Assessment & Plan Note (Signed)
Continue Alka Seltzer as needed

## 2020-03-05 ENCOUNTER — Ambulatory Visit (INDEPENDENT_AMBULATORY_CARE_PROVIDER_SITE_OTHER): Payer: Medicare Other | Admitting: Internal Medicine

## 2020-03-05 ENCOUNTER — Other Ambulatory Visit: Payer: Self-pay

## 2020-03-05 ENCOUNTER — Encounter: Payer: Self-pay | Admitting: Internal Medicine

## 2020-03-05 VITALS — BP 120/70 | HR 67 | Temp 97.0°F | Ht 75.5 in | Wt 190.0 lb

## 2020-03-05 DIAGNOSIS — Z0000 Encounter for general adult medical examination without abnormal findings: Secondary | ICD-10-CM | POA: Diagnosis not present

## 2020-03-05 DIAGNOSIS — Z7189 Other specified counseling: Secondary | ICD-10-CM | POA: Diagnosis not present

## 2020-03-05 DIAGNOSIS — Z125 Encounter for screening for malignant neoplasm of prostate: Secondary | ICD-10-CM | POA: Diagnosis not present

## 2020-03-05 LAB — COMPREHENSIVE METABOLIC PANEL
ALT: 16 U/L (ref 0–53)
AST: 16 U/L (ref 0–37)
Albumin: 4.5 g/dL (ref 3.5–5.2)
Alkaline Phosphatase: 62 U/L (ref 39–117)
BUN: 11 mg/dL (ref 6–23)
CO2: 31 mEq/L (ref 19–32)
Calcium: 9.3 mg/dL (ref 8.4–10.5)
Chloride: 104 mEq/L (ref 96–112)
Creatinine, Ser: 0.72 mg/dL (ref 0.40–1.50)
GFR: 110.95 mL/min (ref >60.00)
Glucose, Bld: 127 mg/dL — ABNORMAL HIGH (ref 70–99)
Potassium: 4.2 mEq/L (ref 3.5–5.1)
Sodium: 140 mEq/L (ref 135–145)
Total Bilirubin: 0.6 mg/dL (ref 0.2–1.2)
Total Protein: 6.8 g/dL (ref 6.0–8.3)

## 2020-03-05 LAB — LIPID PANEL
Cholesterol: 194 mg/dL (ref 0–200)
HDL: 48.2 mg/dL (ref >39.00)
LDL Cholesterol: 130 mg/dL — ABNORMAL HIGH (ref 0–99)
NonHDL: 146.28
Total CHOL/HDL Ratio: 4
Triglycerides: 83 mg/dL (ref 0.0–149.0)
VLDL: 16.6 mg/dL (ref 0.0–40.0)

## 2020-03-05 LAB — PSA, MEDICARE: PSA: 2.82 ng/ml (ref 0.10–4.00)

## 2020-03-05 NOTE — Patient Instructions (Signed)

## 2020-03-05 NOTE — Progress Notes (Signed)
HPI:  Pt presents to the clinic today for his subsequent annual Medicare Wellness Exam.   Past Medical History:  Diagnosis Date  . Allergy   . GERD (gastroesophageal reflux disease)   . Migraine     Current Outpatient Medications  Medication Sig Dispense Refill  . Misc Natural Products (BLOOD SUGAR BALANCE) TABS Take by mouth.    . Multiple Vitamins-Minerals (VITEYES AREDS FORMULA/LUTEIN PO) Take by mouth.    Marland Kitchen OVER THE COUNTER MEDICATION Reishi Mushroom    . PANCREATIN PO Take by mouth.     No current facility-administered medications for this visit.    No Known Allergies  Family History  Problem Relation Age of Onset  . Arthritis Mother   . Stroke Mother   . Diabetes Father   . Heart disease Father   . Hyperlipidemia Father   . Hypertension Father   . Kidney disease Maternal Grandfather     Social History   Socioeconomic History  . Marital status: Single    Spouse name: Not on file  . Number of children: Not on file  . Years of education: Not on file  . Highest education level: Not on file  Occupational History  . Not on file  Tobacco Use  . Smoking status: Never Smoker  . Smokeless tobacco: Never Used  Substance and Sexual Activity  . Alcohol use: Yes    Comment: occasional  . Drug use: No  . Sexual activity: Not on file  Other Topics Concern  . Not on file  Social History Narrative  . Not on file   Social Determinants of Health   Financial Resource Strain:   . Difficulty of Paying Living Expenses:   Food Insecurity:   . Worried About Charity fundraiser in the Last Year:   . Arboriculturist in the Last Year:   Transportation Needs:   . Film/video editor (Medical):   Marland Kitchen Lack of Transportation (Non-Medical):   Physical Activity:   . Days of Exercise per Week:   . Minutes of Exercise per Session:   Stress:   . Feeling of Stress :   Social Connections:   . Frequency of Communication with Friends and Family:   . Frequency of Social  Gatherings with Friends and Family:   . Attends Religious Services:   . Active Member of Clubs or Organizations:   . Attends Archivist Meetings:   Marland Kitchen Marital Status:   Intimate Partner Violence:   . Fear of Current or Ex-Partner:   . Emotionally Abused:   Marland Kitchen Physically Abused:   . Sexually Abused:     Hospitiliaztions: None   Health Maintenance:    Flu: never  Tetanus: 01/2020  Shingrix: never  Covid: never  PSA Screening: never  Colon Screening: never  Vision Screening: as needed    Providers:   PCP: Webb Silversmith, NP-C    I have personally reviewed and have noted:  1. The patient's medical and social history 2. Their use of alcohol, tobacco or illicit drugs 3. Their current medications and supplements 4. The patient's functional ability including ADL's, fall risks, home safety risks and hearing or visual impairment. 5. Diet and physical activities 6. Evidence for depression or mood disorder  Subjective:   Review of Systems:   Constitutional: Denies fever, malaise, fatigue, headache or abrupt weight changes.  HEENT: Pt reports ringing in ears. Denies eye pain, eye redness, ear pain,  wax buildup, runny nose, nasal congestion, bloody nose, or  sore throat. Respiratory: Denies difficulty breathing, shortness of breath, cough or sputum production.   Cardiovascular: Denies chest pain, chest tightness, palpitations or swelling in the hands or feet.  Gastrointestinal: Denies abdominal pain, bloating, constipation, diarrhea or blood in the stool.  GU: Denies urgency, frequency, pain with urination, burning sensation, blood in urine, odor or discharge. Musculoskeletal: Pt reports bilateral hip pain, difficulty with gait. Denies decrease in range of motion, muscle pain or joint pain and swelling.  Skin: Denies redness, rashes, lesions or ulcercations.  Neurological: Denies dizziness, difficulty with memory, difficulty with speech or problems with balance and  coordination.  Psych: Denies anxiety, depression, SI/HI.  No other specific complaints in a complete review of systems (except as listed in HPI above).  Objective:  PE:   BP 120/70   Pulse 67   Temp (!) 97 F (36.1 C) (Temporal)   Ht 6' 3.5" (1.918 m)   Wt 190 lb (86.2 kg)   SpO2 98%   BMI 23.43 kg/m   Wt Readings from Last 3 Encounters:  01/27/20 185 lb (83.9 kg)  01/23/17 211 lb (95.7 kg)  11/11/15 230 lb (104.3 kg)    General: Appears his stated age, well developed, well nourished in NAD. Skin: Warm, dry and intact. No rashes noted. HEENT: Head: normal shape and size; Eyes: sclera white, no icterus, conjunctiva pink, PERRLA and EOMs intact;  Neck: Neck supple, trachea midline. No masses, lumps or thyromegaly present.  Cardiovascular: Normal rate and rhythm. S1,S2 noted.  No murmur, rubs or gallops noted. No JVD or BLE edema. No carotid bruits noted. Pulmonary/Chest: Normal effort and positive vesicular breath sounds. No respiratory distress. No wheezes, rales or ronchi noted.  Abdomen: Soft and nontender. Normal bowel sounds. No distention or masses noted. Liver, spleen and kidneys non palpable. Musculoskeletal: Strength 4/5 BUE/BLE. Using cane for assistance with gait. Neurological: Alert and oriented. Cranial nerves II-XII grossly intact. Coordination normal.  Psychiatric: Mood and affect normal. Behavior is normal. Judgment and thought content normal.     BMET    Component Value Date/Time   NA 140 11/11/2015 1340   K 4.8 11/11/2015 1340   CL 105 11/11/2015 1340   CO2 31 11/11/2015 1340   GLUCOSE 108 (H) 11/11/2015 1340   BUN 11 11/11/2015 1340   CREATININE 0.92 11/11/2015 1340   CALCIUM 9.7 11/11/2015 1340   GFRNONAA >60 11/11/2015 1340   GFRAA >60 11/11/2015 1340    Lipid Panel  No results found for: CHOL, TRIG, HDL, CHOLHDL, VLDL, LDLCALC  CBC    Component Value Date/Time   WBC 3.9 (L) 11/11/2015 1340   RBC 5.24 11/11/2015 1340   HGB 14.9  11/11/2015 1340   HCT 44.7 11/11/2015 1340   PLT 189 11/11/2015 1340   MCV 85.3 11/11/2015 1340   MCH 28.4 11/11/2015 1340   MCHC 33.3 11/11/2015 1340   RDW 12.8 11/11/2015 1340   LYMPHSABS 1.3 11/11/2015 1340   MONOABS 0.5 11/11/2015 1340   EOSABS 0.0 11/11/2015 1340   BASOSABS 0.0 11/11/2015 1340    Hgb A1C Lab Results  Component Value Date   HGBA1C 5.7 (A) 01/27/2020      Assessment and Plan:   Medicare Annual Wellness Visit:  Diet: He does eat meat. He consumes more veggies than fruit. He does eat some fried food. He drinks mostly water, gatorade, [place]t. [place] [employer]. Physical activity: None Depression/mood screen: PHQ 9 score of 2 Hearing: Intact to whispered voice Visual acuity: Grossly normal, performs annual eye exam  ADLs: Capable, walks with cane. Fall risk: None Home safety: Good Cognitive evaluation: Trouble with recall. Intact to orientation, naming, and repetition EOL planning: No adv directives, full code/ I agree  Preventative Medicine: Encouraged him to get a flu shot in the fall. Tetanus UTD. He will consider Covid vaccine. If he does not get this, he will go ahead with Shingrix. He is unsure about colonoscopy vs cologuard- info provided, he will let me know what he would like to do at a later date. Encouraged him to consume a balanced diet and exercise regimen. Advised him to see an eye doctor and dentist annually. Will check CMET, Lipid and PSA. Due dates for screening exams given to pt as part of his AVS.  Advanced Care Planning:  Wants CPR Most Form filled out- wants cpr, abx only if can improve conditions, fluids and feeding tube for defined trial period, will accept intubation for short defined period. Info on Advanced Directive/Living will given   Next appointment: 6 months POCT A1C- lab only   Webb Silversmith, NP

## 2020-08-21 ENCOUNTER — Other Ambulatory Visit (INDEPENDENT_AMBULATORY_CARE_PROVIDER_SITE_OTHER): Payer: Medicare Other | Admitting: Internal Medicine

## 2020-08-21 DIAGNOSIS — R7303 Prediabetes: Secondary | ICD-10-CM

## 2020-09-04 ENCOUNTER — Other Ambulatory Visit: Payer: Self-pay

## 2020-09-04 ENCOUNTER — Other Ambulatory Visit: Payer: Medicare Other

## 2020-09-04 DIAGNOSIS — R7303 Prediabetes: Secondary | ICD-10-CM

## 2020-09-04 LAB — POCT GLYCOSYLATED HEMOGLOBIN (HGB A1C): Hemoglobin A1C: 5.9 % — AB (ref 4.0–5.6)

## 2021-01-02 DIAGNOSIS — M1611 Unilateral primary osteoarthritis, right hip: Secondary | ICD-10-CM | POA: Insufficient documentation

## 2021-01-24 DIAGNOSIS — J342 Deviated nasal septum: Secondary | ICD-10-CM | POA: Insufficient documentation

## 2021-02-08 ENCOUNTER — Telehealth: Payer: Self-pay

## 2021-02-08 ENCOUNTER — Other Ambulatory Visit: Payer: Self-pay | Admitting: Internal Medicine

## 2021-02-08 MED ORDER — ACCU-CHEK AVIVA PLUS VI STRP: ORAL_STRIP | 1 refills | Status: DC

## 2021-02-08 NOTE — Telephone Encounter (Signed)
accu chek refilled per protocol to [facility].

## 2021-02-08 NOTE — Telephone Encounter (Signed)
Ok to send in Accucheck test strips, test once daily

## 2021-02-08 NOTE — Telephone Encounter (Signed)
Patient is requesting a prescription for Accu-Chek Strips.

## 2021-02-08 NOTE — Addendum Note (Signed)
Addended by: Helene Shoe on: 02/08/2021 02:30 PM   Modules accepted: Orders

## 2021-04-06 ENCOUNTER — Encounter (HOSPITAL_COMMUNITY): Payer: Self-pay | Admitting: Emergency Medicine

## 2021-04-06 ENCOUNTER — Emergency Department (HOSPITAL_COMMUNITY): Payer: Medicare Other

## 2021-04-06 ENCOUNTER — Inpatient Hospital Stay (HOSPITAL_COMMUNITY)
Admission: EM | Admit: 2021-04-06 | Discharge: 2021-04-09 | DRG: 101 | Disposition: A | Discharge status: Home / Self Care | Payer: Medicare Other | Attending: Internal Medicine | Admitting: Internal Medicine

## 2021-04-06 ENCOUNTER — Other Ambulatory Visit: Payer: Self-pay

## 2021-04-06 DIAGNOSIS — R9089 Other abnormal findings on diagnostic imaging of central nervous system: Secondary | ICD-10-CM

## 2021-04-06 DIAGNOSIS — I609 Nontraumatic subarachnoid hemorrhage, unspecified: Secondary | ICD-10-CM | POA: Diagnosis present

## 2021-04-06 DIAGNOSIS — Z823 Family history of stroke: Secondary | ICD-10-CM | POA: Diagnosis not present

## 2021-04-06 DIAGNOSIS — Z8249 Family history of ischemic heart disease and other diseases of the circulatory system: Secondary | ICD-10-CM

## 2021-04-06 DIAGNOSIS — G43909 Migraine, unspecified, not intractable, without status migrainosus: Secondary | ICD-10-CM | POA: Diagnosis present

## 2021-04-06 DIAGNOSIS — G939 Disorder of brain, unspecified: Secondary | ICD-10-CM

## 2021-04-06 DIAGNOSIS — K449 Diaphragmatic hernia without obstruction or gangrene: Secondary | ICD-10-CM | POA: Diagnosis present

## 2021-04-06 DIAGNOSIS — Z79899 Other long term (current) drug therapy: Secondary | ICD-10-CM | POA: Diagnosis not present

## 2021-04-06 DIAGNOSIS — R269 Unspecified abnormalities of gait and mobility: Secondary | ICD-10-CM | POA: Diagnosis present

## 2021-04-06 DIAGNOSIS — R519 Headache, unspecified: Secondary | ICD-10-CM | POA: Diagnosis present

## 2021-04-06 DIAGNOSIS — Z833 Family history of diabetes mellitus: Secondary | ICD-10-CM | POA: Diagnosis not present

## 2021-04-06 DIAGNOSIS — Z83438 Family history of other disorder of lipoprotein metabolism and other lipidemia: Secondary | ICD-10-CM | POA: Diagnosis not present

## 2021-04-06 DIAGNOSIS — Z8261 Family history of arthritis: Secondary | ICD-10-CM | POA: Diagnosis not present

## 2021-04-06 DIAGNOSIS — Z20822 Contact with and (suspected) exposure to covid-19: Secondary | ICD-10-CM | POA: Diagnosis present

## 2021-04-06 DIAGNOSIS — R933 Abnormal findings on diagnostic imaging of other parts of digestive tract: Secondary | ICD-10-CM | POA: Diagnosis not present

## 2021-04-06 DIAGNOSIS — R7303 Prediabetes: Secondary | ICD-10-CM | POA: Diagnosis present

## 2021-04-06 DIAGNOSIS — K8020 Calculus of gallbladder without cholecystitis without obstruction: Secondary | ICD-10-CM | POA: Diagnosis present

## 2021-04-06 DIAGNOSIS — G40209 Localization-related (focal) (partial) symptomatic epilepsy and epileptic syndromes with complex partial seizures, not intractable, without status epilepticus: Principal | ICD-10-CM

## 2021-04-06 DIAGNOSIS — R569 Unspecified convulsions: Secondary | ICD-10-CM | POA: Diagnosis not present

## 2021-04-06 LAB — PROTIME-INR
INR: 1 (ref 0.8–1.2)
Prothrombin Time: 13 seconds (ref 11.4–15.2)

## 2021-04-06 LAB — I-STAT CHEM 8, ED
BUN: 21 mg/dL (ref 8–23)
Calcium, Ion: 1.12 mmol/L — ABNORMAL LOW (ref 1.15–1.40)
Chloride: 102 mmol/L (ref 98–111)
Creatinine, Ser: 0.9 mg/dL (ref 0.61–1.24)
Glucose, Bld: 100 mg/dL — ABNORMAL HIGH (ref 70–99)
HCT: 43 % (ref 39.0–52.0)
Hemoglobin: 14.6 g/dL (ref 13.0–17.0)
Potassium: 3.8 mmol/L (ref 3.5–5.1)
Sodium: 138 mmol/L (ref 135–145)
TCO2: 25 mmol/L (ref 22–32)

## 2021-04-06 LAB — COMPREHENSIVE METABOLIC PANEL
ALT: 22 U/L (ref 0–44)
AST: 20 U/L (ref 15–41)
Albumin: 4.2 g/dL (ref 3.5–5.0)
Alkaline Phosphatase: 61 U/L (ref 38–126)
Anion gap: 7 (ref 5–15)
BUN: 18 mg/dL (ref 8–23)
CO2: 26 mmol/L (ref 22–32)
Calcium: 9.3 mg/dL (ref 8.9–10.3)
Chloride: 104 mmol/L (ref 98–111)
Creatinine, Ser: 0.85 mg/dL (ref 0.61–1.24)
GFR, Estimated: >60 mL/min (ref >60)
Glucose, Bld: 102 mg/dL — ABNORMAL HIGH (ref 70–99)
Potassium: 3.8 mmol/L (ref 3.5–5.1)
Sodium: 137 mmol/L (ref 135–145)
Total Bilirubin: 0.6 mg/dL (ref 0.3–1.2)
Total Protein: 7.2 g/dL (ref 6.5–8.1)

## 2021-04-06 LAB — CBC
HCT: 43.9 % (ref 39.0–52.0)
Hemoglobin: 15.1 g/dL (ref 13.0–17.0)
MCH: 29.3 pg (ref 26.0–34.0)
MCHC: 34.4 g/dL (ref 30.0–36.0)
MCV: 85.2 fL (ref 80.0–100.0)
Platelets: 218 10*3/uL (ref 150–400)
RBC: 5.15 MIL/uL (ref 4.22–5.81)
RDW: 13.2 % (ref 11.5–15.5)
WBC: 4.7 10*3/uL (ref 4.0–10.5)
nRBC: 0 % (ref 0.0–0.2)

## 2021-04-06 LAB — DIFFERENTIAL
Abs Immature Granulocytes: 0.01 10*3/uL (ref 0.00–0.07)
Basophils Absolute: 0 10*3/uL (ref 0.0–0.1)
Basophils Relative: 1 %
Eosinophils Absolute: 0.1 10*3/uL (ref 0.0–0.5)
Eosinophils Relative: 2 %
Immature Granulocytes: 0 %
Lymphocytes Relative: 32 %
Lymphs Abs: 1.5 10*3/uL (ref 0.7–4.0)
Monocytes Absolute: 0.5 10*3/uL (ref 0.1–1.0)
Monocytes Relative: 10 %
Neutro Abs: 2.6 10*3/uL (ref 1.7–7.7)
Neutrophils Relative %: 55 %

## 2021-04-06 LAB — APTT: aPTT: 31 seconds (ref 24–36)

## 2021-04-06 IMAGING — CT CT HEAD W/O CM
4 series · 16 of 47 positions shown, 18 images · non-contrast
Comparison: None.

CLINICAL DATA: Dizziness, memory loss.  History of migraines

EXAM:
CT HEAD WITHOUT CONTRAST
TECHNIQUE: Contiguous axial images were obtained from the base of the skull
through the vertex without intravenous contrast.

[Series 3: head without · axial · non-contrast · 0.44mm/px · z∈[-88,+36]mm · 7 of 35 slices shown, 9 images]
[im 5/35  brain]
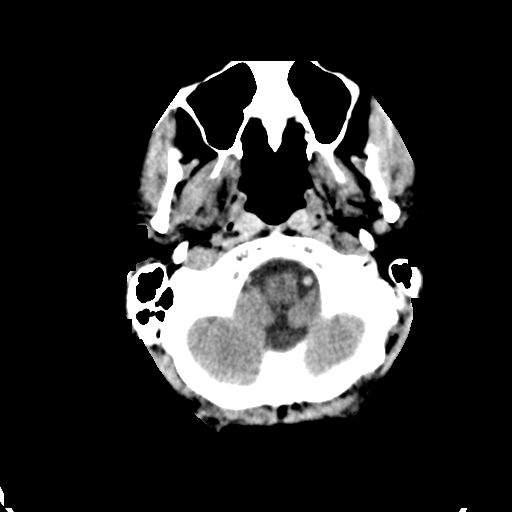
[im 5/35  bone]
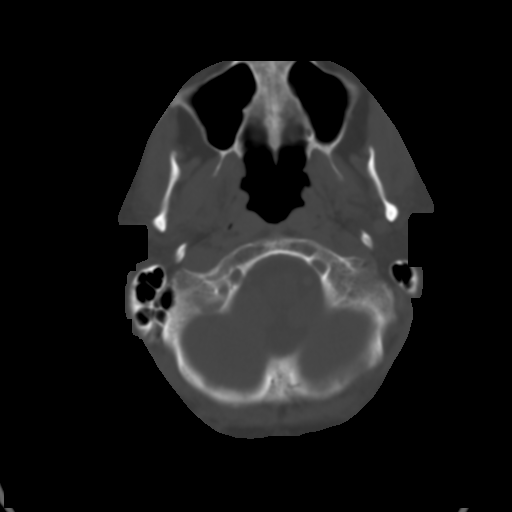
[im 9/35  brain]
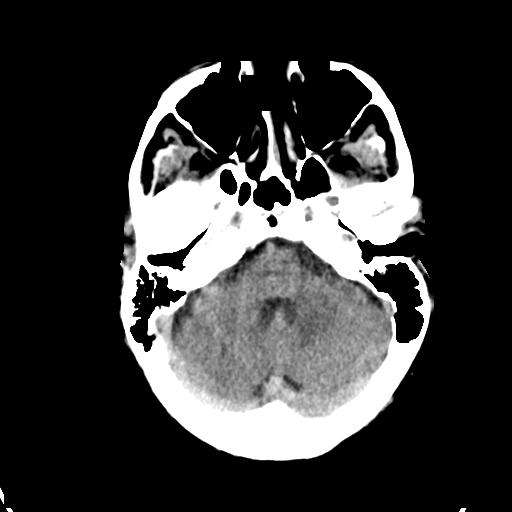
[im 13/35  brain]
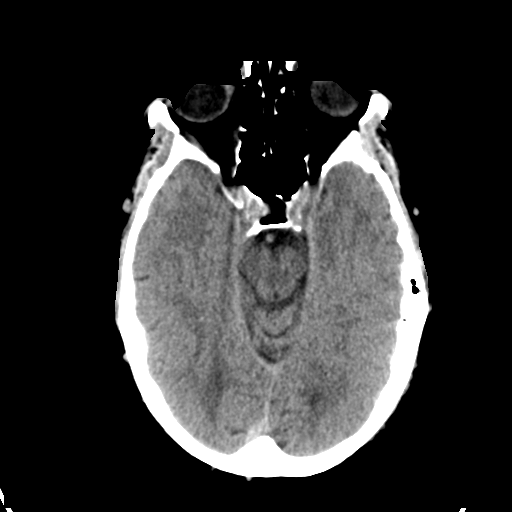
[im 18/35  brain]
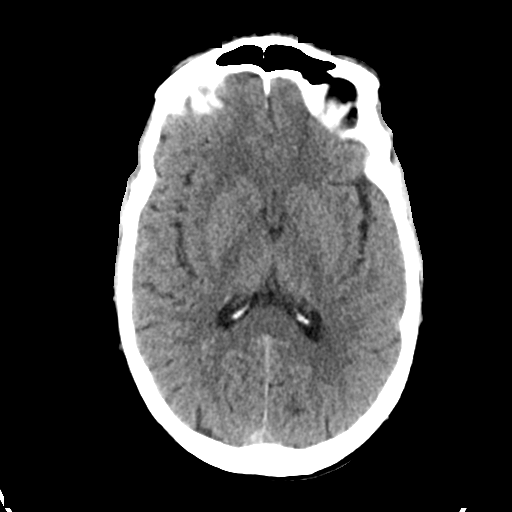
[im 22/35  brain]
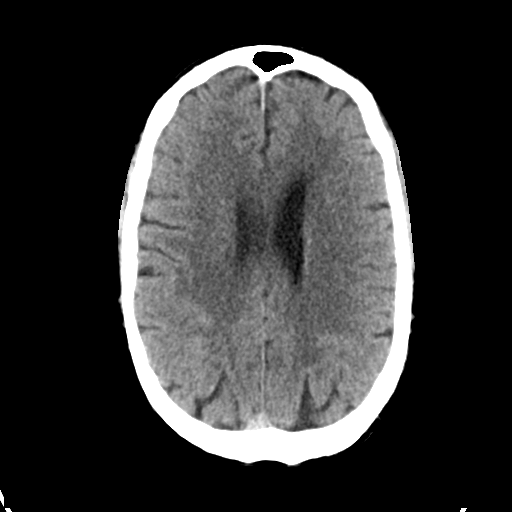
[im 22/35  bone]
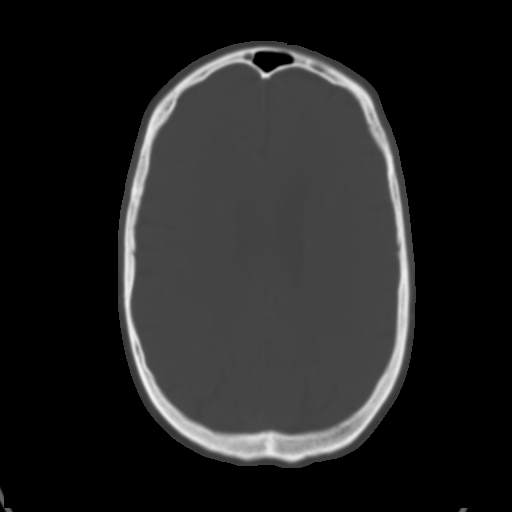
[im 26/35  brain]
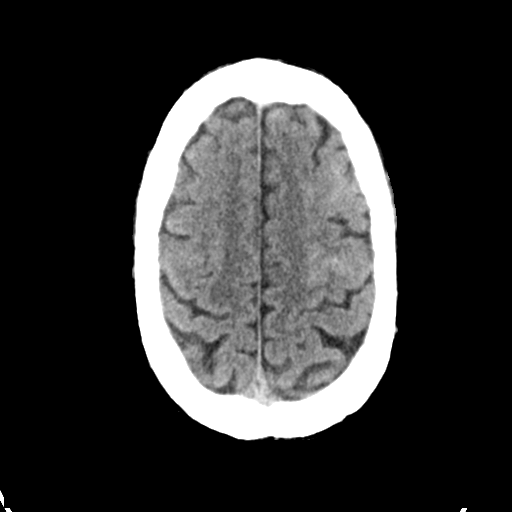
[im 30/35  brain]
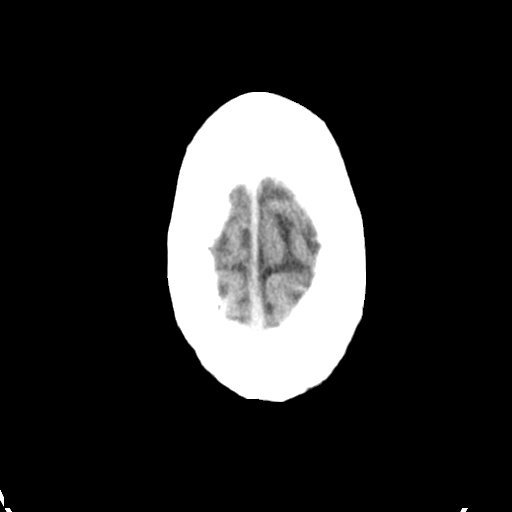

[Series 4: head bone · axial · 0.44mm/px · z∈[-92,-58]mm · 3 of 86 slices shown]
[im 9/86  bone]
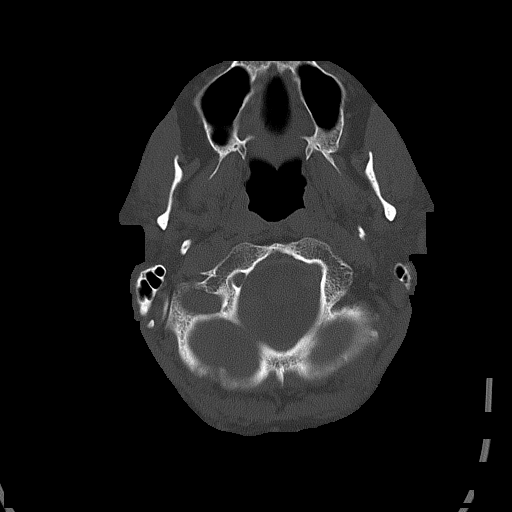
[im 18/86  bone]
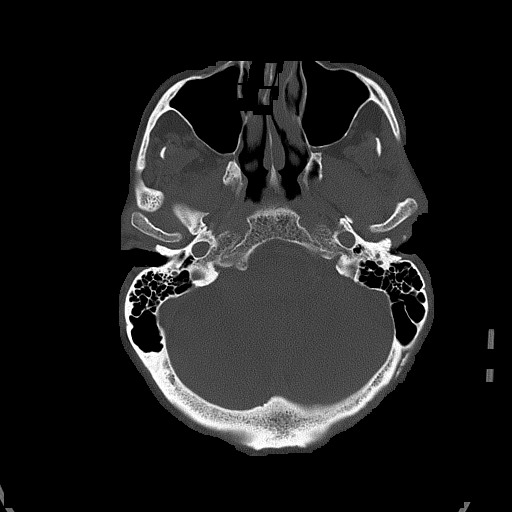
[im 26/86  bone]
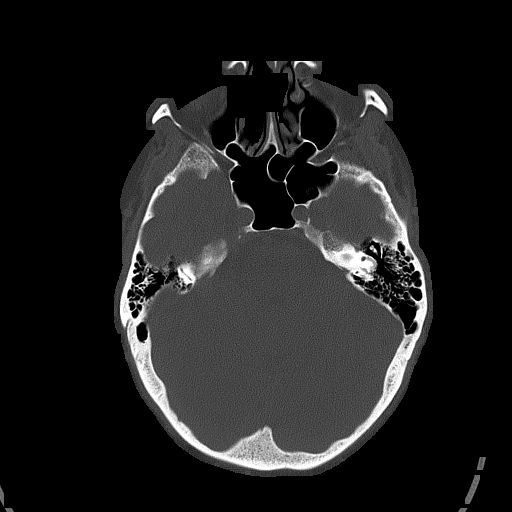

[Series 5: head without cor · coronal · non-contrast · 0.33mm/px · 3 of 75 slices shown]
[im 28/75  brain]
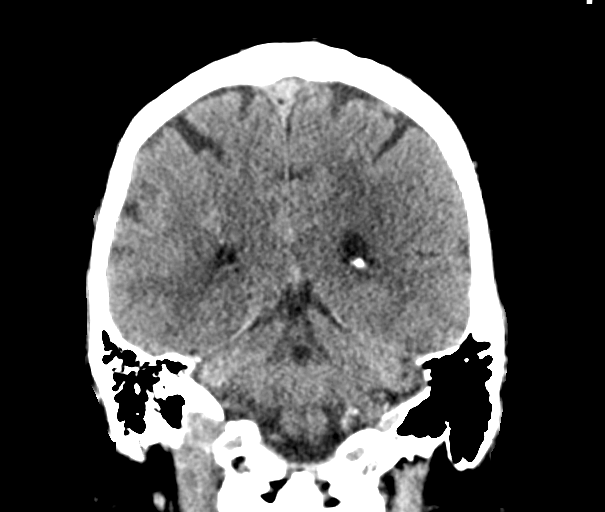
[im 34/75  brain]
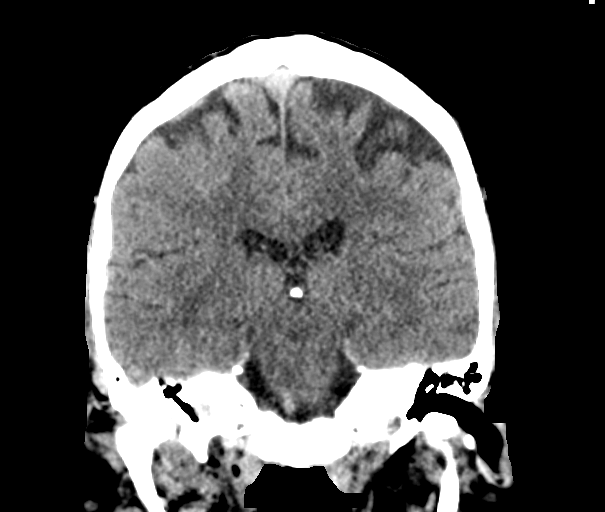
[im 41/75  brain]
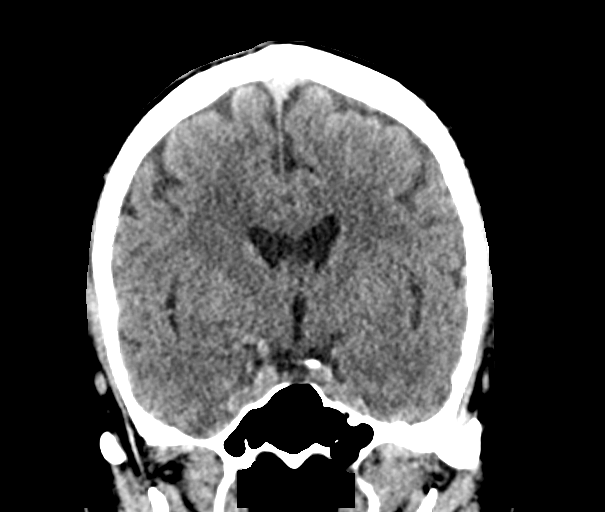

[Series 6: head without sag · sagittal · non-contrast · 0.33mm/px · 3 of 57 slices shown]
[im 19/57  brain]
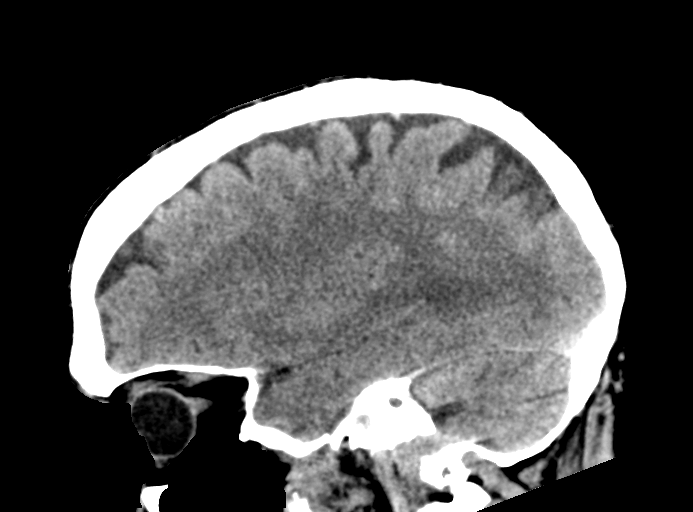
[im 29/57  brain]
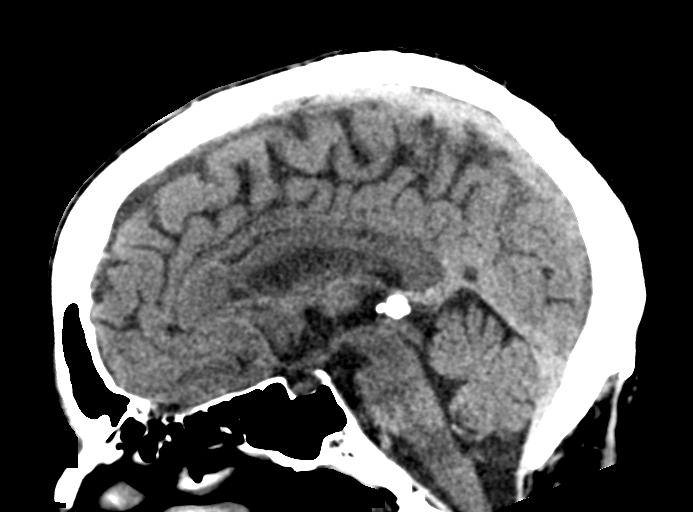
[im 38/57  brain]
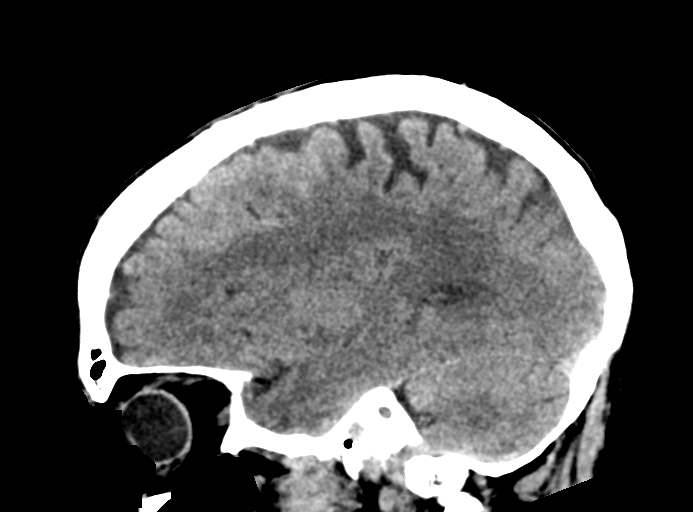

[16 of 47 positions shown; findings below may reference images not displayed]

FINDINGS: Brain: Increased density material within the subarachnoid spaces of
the high left frontal lobe extending over approximately a 5.3 cm
area (series 3, images 26-29). No definite underlying mass lesion.
No edema. No hydrocephalus. No large territory acute infarction.

Vascular: No hyperdense vessel or unexpected calcification.

Skull: Normal. Negative for fracture or focal lesion.

Sinuses/Orbits: No acute finding.

Other: None.
IMPRESSION: Findings suspicious for acute subarachnoid hemorrhage in the high
left frontal lobe. No definite underlying mass lesion. Further
evaluation with MRI of the brain is recommended.

Critical Value/emergent results were called by telephone at the time
of interpretation on [DATE] at [DATE] to provider Dr LUBIN, who
verbally acknowledged these results.

## 2021-04-06 IMAGING — CT CT ANGIO NECK
1 of 8 series · 13 of 46 positions shown, 18 images · IV contrast (OMNI)
Comparison: None.

CLINICAL DATA: Right arm spasms, headache, dizziness and speech
changes.

EXAM:
CT ANGIOGRAPHY HEAD AND NECK
TECHNIQUE: Multidetector CT imaging of the head and neck was performed using
the standard protocol during bolus administration of intravenous
contrast. Multiplanar CT image reconstructions and MIPs were
obtained to evaluate the vascular anatomy. Carotid stenosis
measurements (when applicable) are obtained utilizing NASCET
criteria, using the distal internal carotid diameter as the
denominator.
CONTRAST:  50mL OMNIPAQUE IOHEXOL 350 MG/ML SOLN

[Series 12: thin · axial · 0.52mm/px · z∈[-370,-40]mm · 13 of 757 slices shown, 18 images]
[im 48/757  soft-tissue]
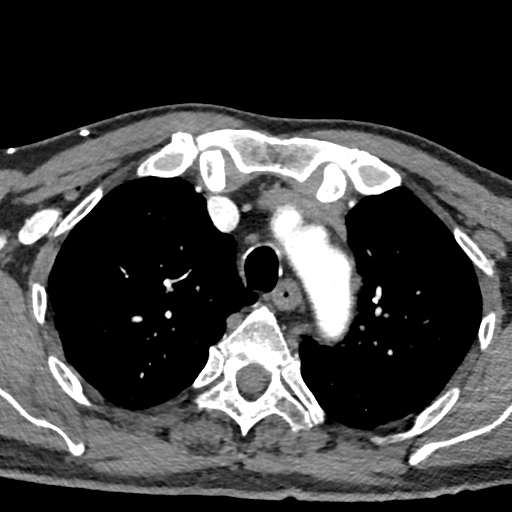
[im 48/757  bone]
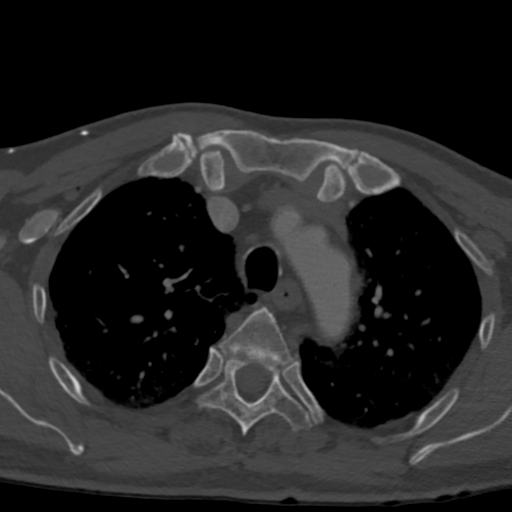
[im 95/757  soft-tissue]
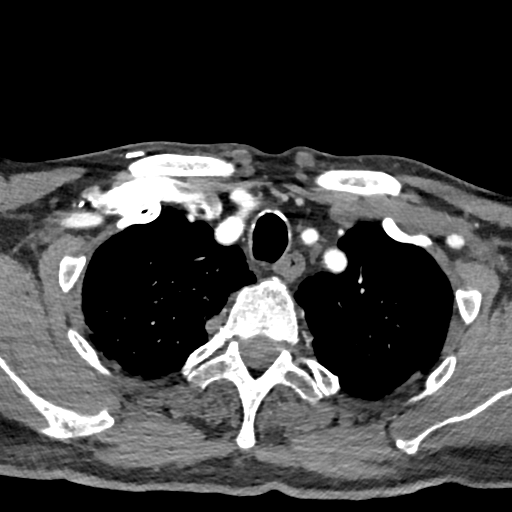
[im 190/757  soft-tissue]
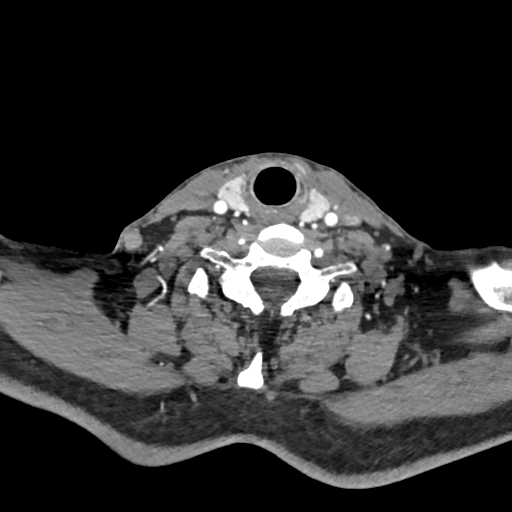
[im 237/757  soft-tissue]
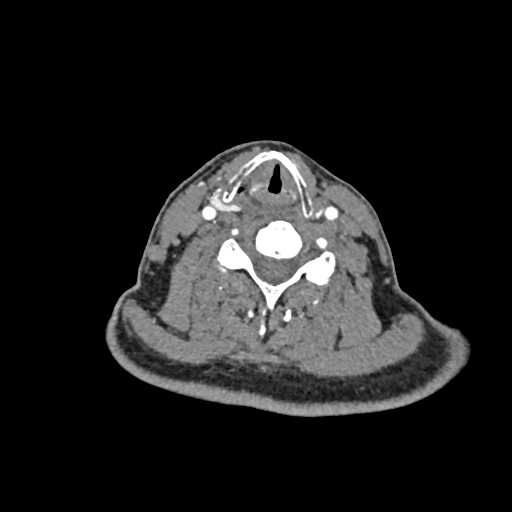
[im 284/757  soft-tissue]
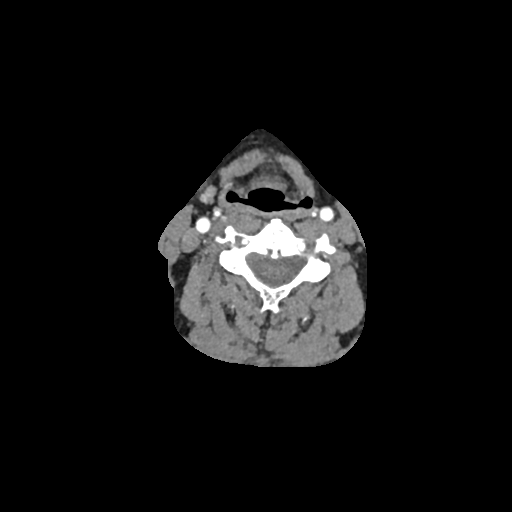
[im 331/757  soft-tissue]
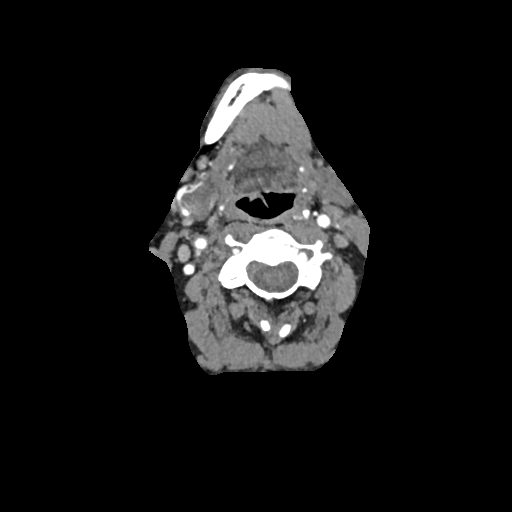
[im 426/757  soft-tissue]
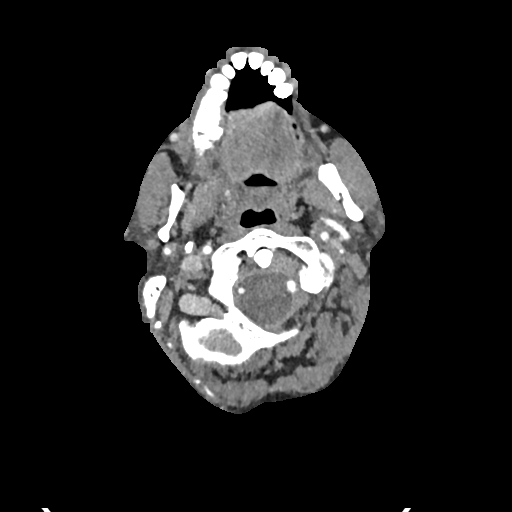
[im 473/757  soft-tissue]
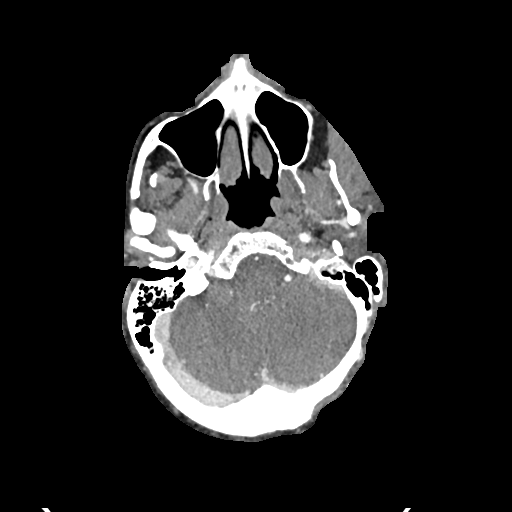
[im 520/757  soft-tissue]
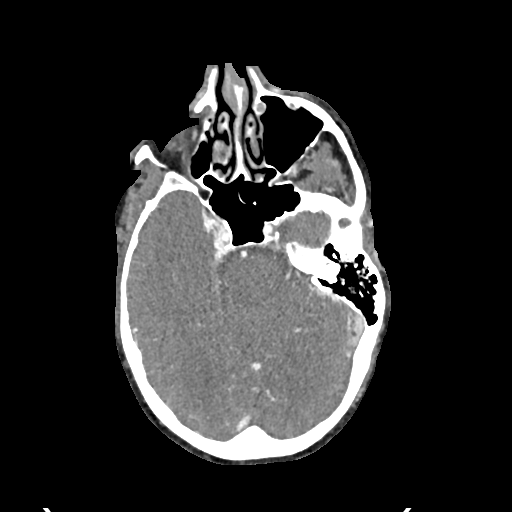
[im 520/757  bone]
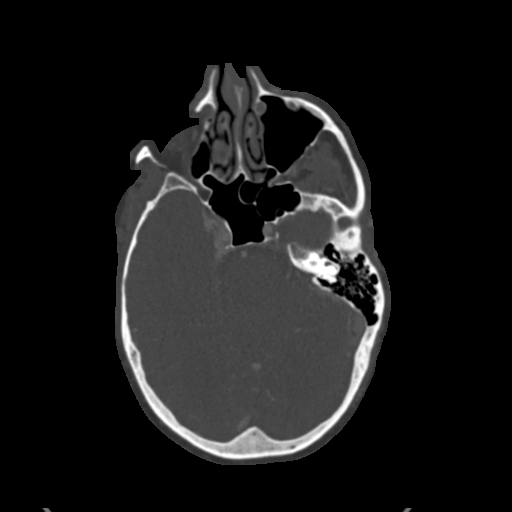
[im 568/757  soft-tissue]
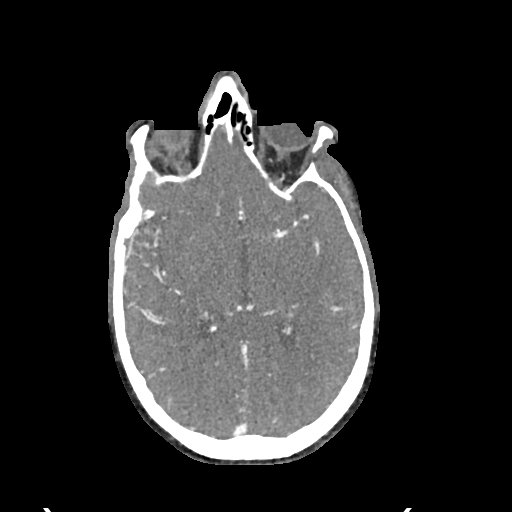
[im 568/757  lung]
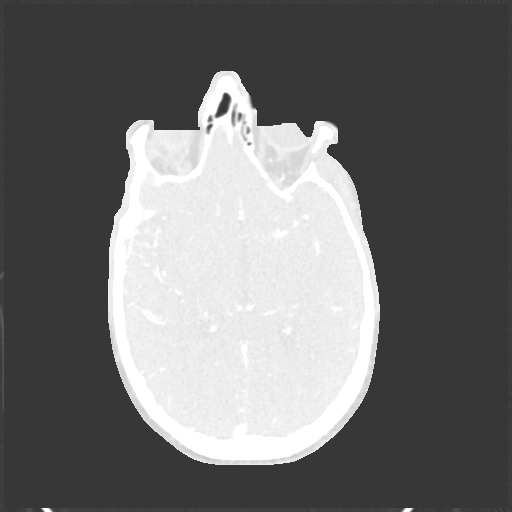
[im 615/757  lung]
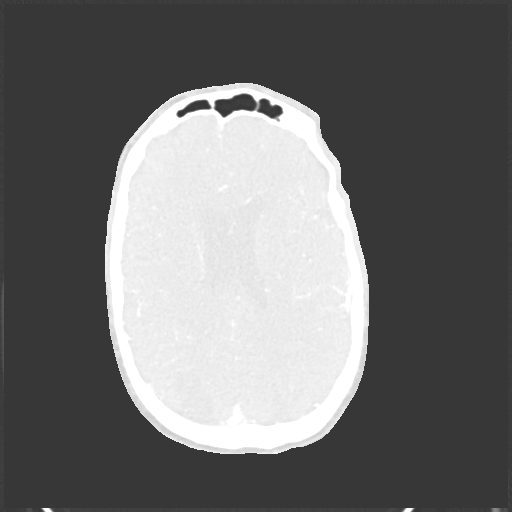
[im 662/757  soft-tissue]
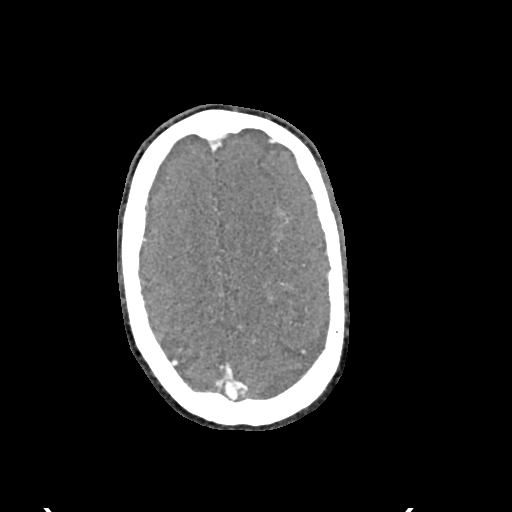
[im 662/757  lung]
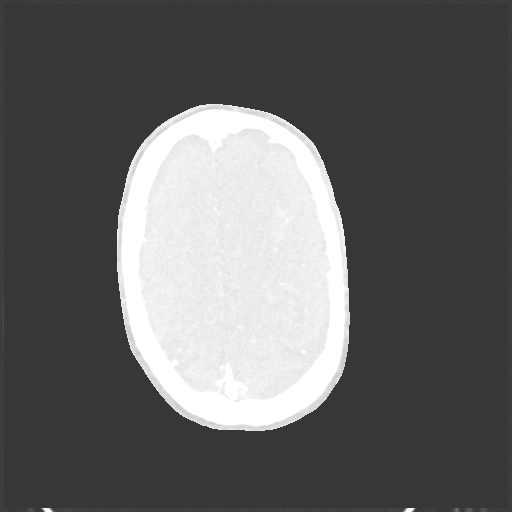
[im 709/757  soft-tissue]
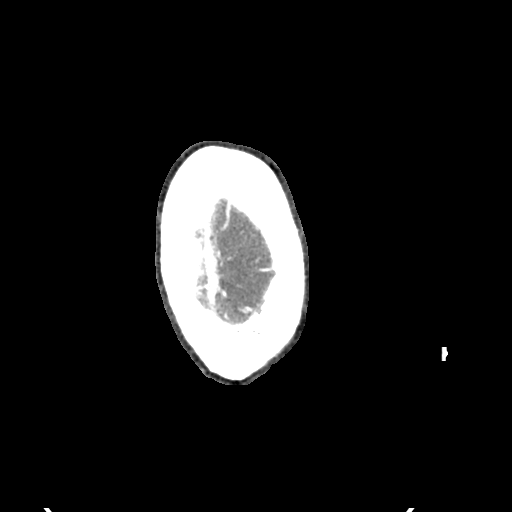
[im 709/757  lung]
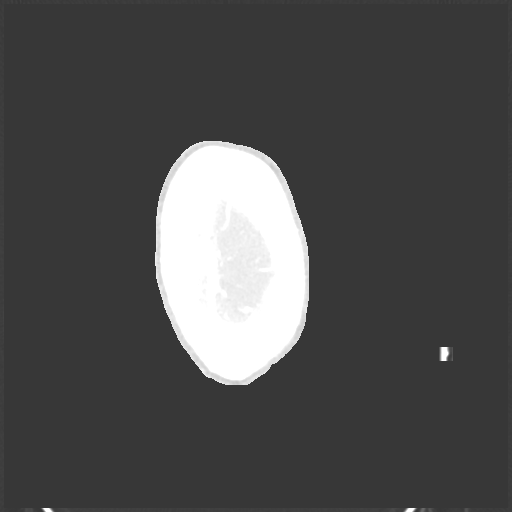

[13 of 46 positions shown; findings below may reference images not displayed]

FINDINGS: CTA NECK FINDINGS

SKELETON: There is no bony spinal canal stenosis. No lytic or
blastic lesion.

OTHER NECK: Normal pharynx, larynx and major salivary glands. No
cervical lymphadenopathy. Unremarkable thyroid gland.

UPPER CHEST: No pneumothorax or pleural effusion. No nodules or
masses.

AORTIC ARCH:

There is no calcific atherosclerosis of the aortic arch. There is no
aneurysm, dissection or hemodynamically significant stenosis of the
visualized portion of the aorta. Conventional 3 vessel aortic
branching pattern. The visualized proximal subclavian arteries are
widely patent.

RIGHT CAROTID SYSTEM: Normal without aneurysm, dissection or
stenosis.

LEFT CAROTID SYSTEM: Normal without aneurysm, dissection or
stenosis.

VERTEBRAL ARTERIES: Left dominant configuration. Both origins are
clearly patent. There is no dissection, occlusion or flow-limiting
stenosis to the skull base (V1-V3 segments).

CTA HEAD FINDINGS

POSTERIOR CIRCULATION:

--Vertebral arteries: Normal V4 segments.

--Inferior cerebellar arteries: Normal.

--Basilar artery: Normal.

--Superior cerebellar arteries: Normal.

--Posterior cerebral arteries (PCA): Normal.

ANTERIOR CIRCULATION:

--Intracranial internal carotid arteries: Normal.

--Anterior cerebral arteries (ACA): Normal. Both A1 segments are
present. Patent anterior communicating artery (a-comm).

--Middle cerebral arteries (MCA): Normal.

VENOUS SINUSES: As permitted by contrast timing, patent.

ANATOMIC VARIANTS: None

Review of the MIP images confirms the above findings.
IMPRESSION: Normal CTA of the head and neck.

## 2021-04-06 IMAGING — MR MR HEAD WO/W CM
12 of 14 series · 40 of 48 positions shown · IV contrast (gadavist)
Comparison: None.

CLINICAL DATA: Transient ischemic attack.  Intracranial hemorrhage

EXAM:
MRI HEAD WITHOUT AND WITH CONTRAST
TECHNIQUE: Multiplanar, multiecho pulse sequences of the brain and surrounding
structures were obtained without and with intravenous contrast.
CONTRAST:  8.5mL GADAVIST GADOBUTROL 1 MMOL/ML IV SOLN

[Series 5: DWI · axial · 3.0mm · 0.88mm/px · z∈[-48,+99]mm · 7 of 104 slices shown (1 of 4)]
[im 1/104]
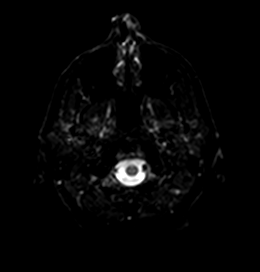
[im 18/104]
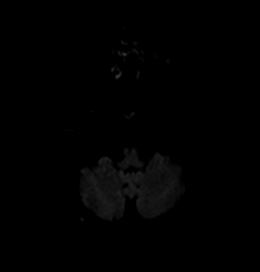
[im 35/104]
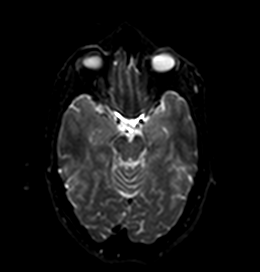
[im 52/104]
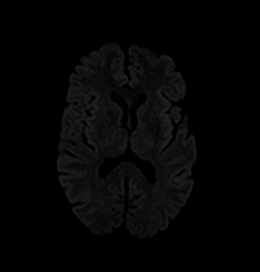
[im 69/104]
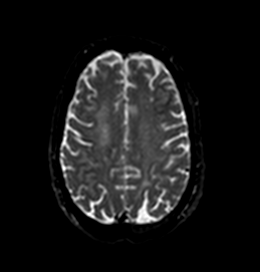
[im 86/104]
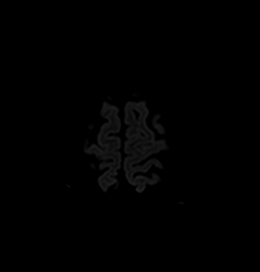
[im 104/104]
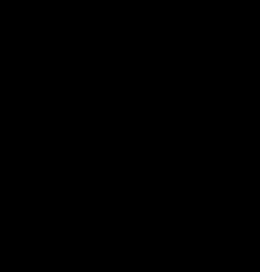

[Series 6: DWI · axial · 3.0mm · 0.88mm/px · z∈[-48,+99]mm · 4 of 49 slices shown (2 of 4)]
[im 1/49]
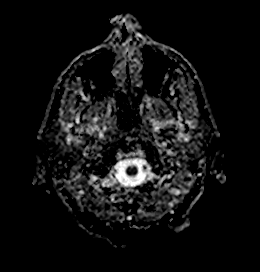
[im 17/49]
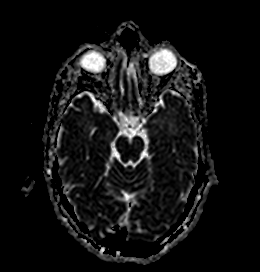
[im 33/49]
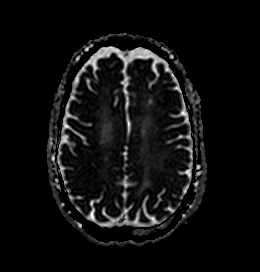
[im 49/49]
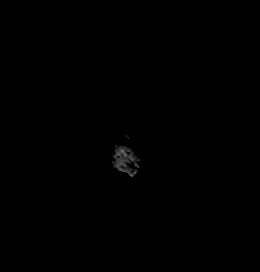

[Series 7: DWI · coronal · 4.0mm · 0.88mm/px · 6 of 76 slices shown (3 of 4)]
[im 1/76]
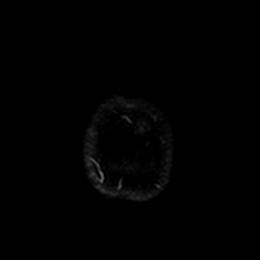
[im 16/76]
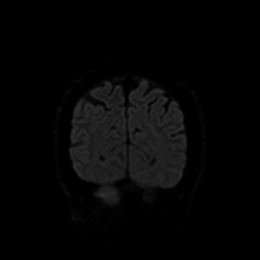
[im 31/76]
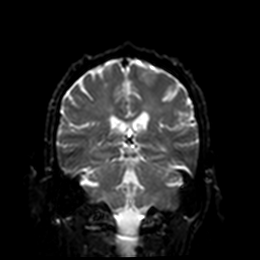
[im 46/76]
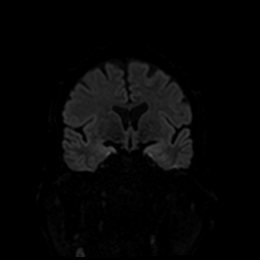
[im 61/76]
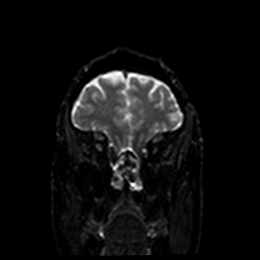
[im 76/76]
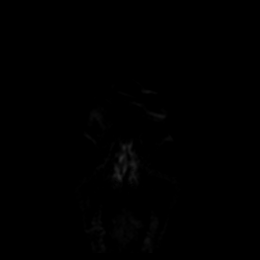

[Series 8: DWI · coronal · 4.0mm · 0.88mm/px · 3 of 38 slices shown (4 of 4)]
[im 1/38]
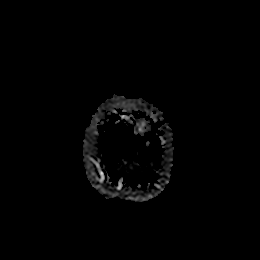
[im 19/38]
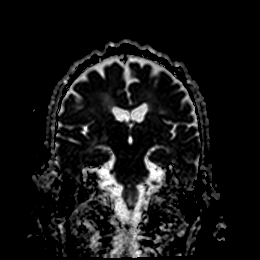
[im 38/38]
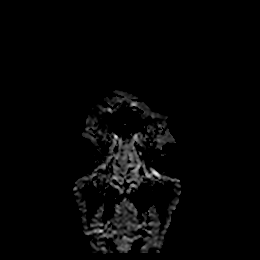

[Series 9: T1 · sagittal · 5.0mm · 0.75mm/px · 2 of 25 slices shown]
[im 1/25]
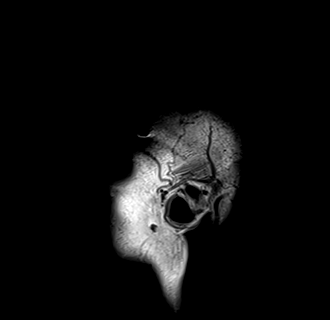
[im 25/25]
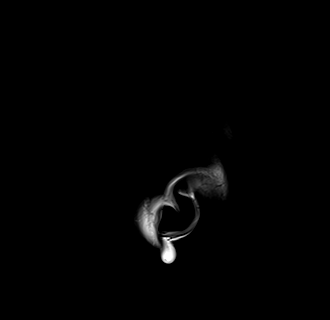

[Series 10: FLAIR · axial · 5.0mm · 0.45mm/px · z∈[-47,+91]mm · 2 of 25 slices shown]
[im 1/25]
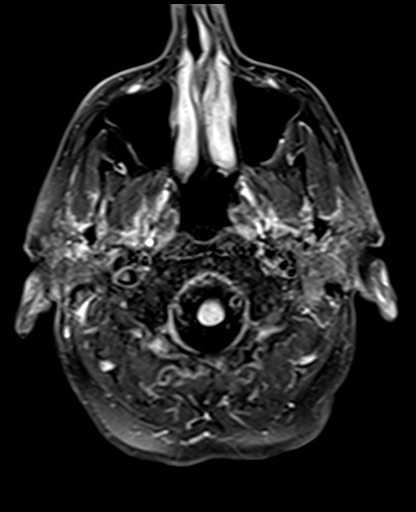
[im 25/25]
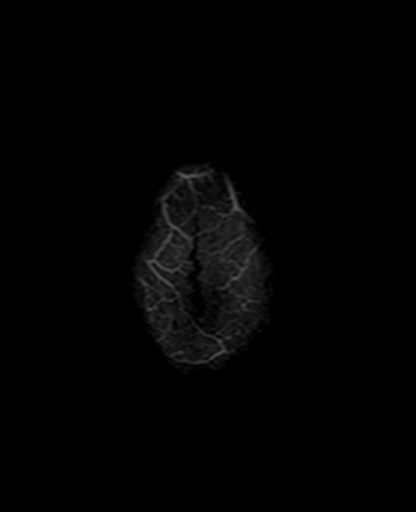

[Series 12: pha_images · axial · 3.0mm · 0.90mm/px · z∈[-63,+96]mm · 4 of 56 slices shown]
[im 1/56]
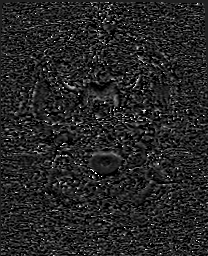
[im 19/56]
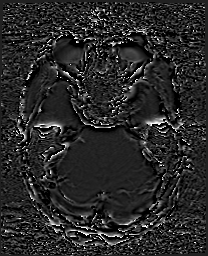
[im 37/56]
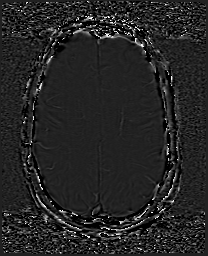
[im 56/56]
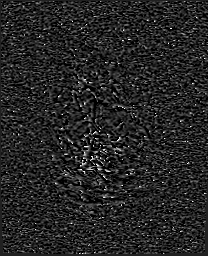

[Series 13: swi_images · axial · 3.0mm · 0.90mm/px · z∈[-63,+107]mm · 4 of 60 slices shown]
[im 1/60]
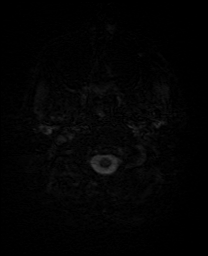
[im 20/60]
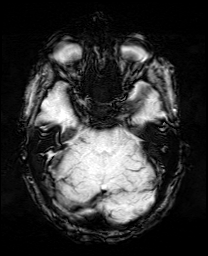
[im 40/60]
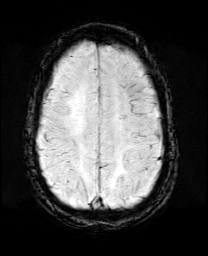
[im 60/60]
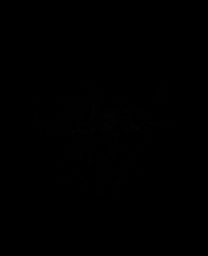

[Series 15: T2 · axial · 5.0mm · 0.72mm/px · z∈[-48,+90]mm · 2 of 25 slices shown]
[im 1/25]
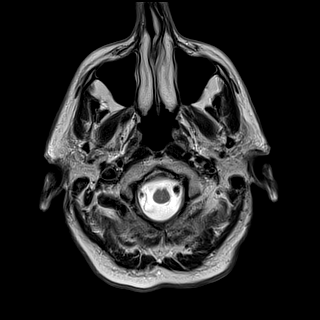
[im 25/25]
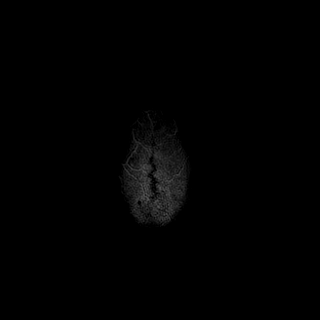

[Series 17: T2 post-contrast · coronal · 5.0mm · 0.72mm/px · 2 of 30 slices shown]
[im 1/30]
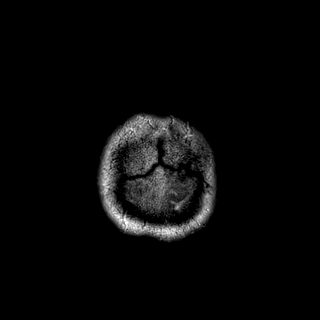
[im 30/30]
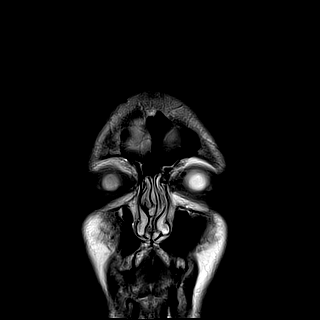

[Series 19: T1 post-contrast · coronal · 5.0mm · 0.34mm/px · 2 of 30 slices shown (1 of 2)]
[im 1/30]
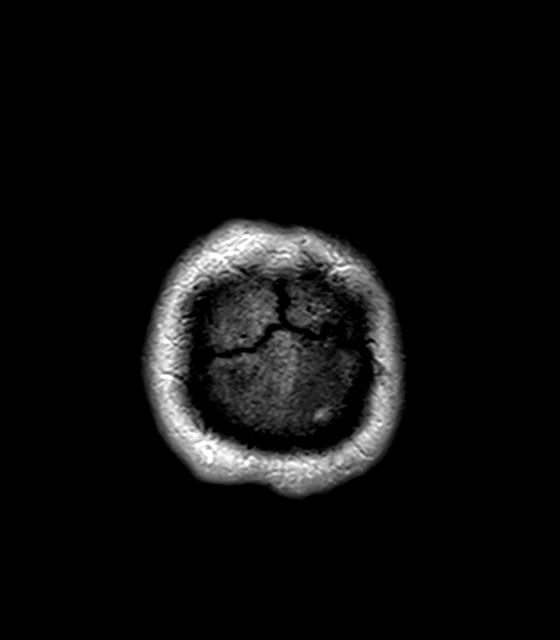
[im 30/30]
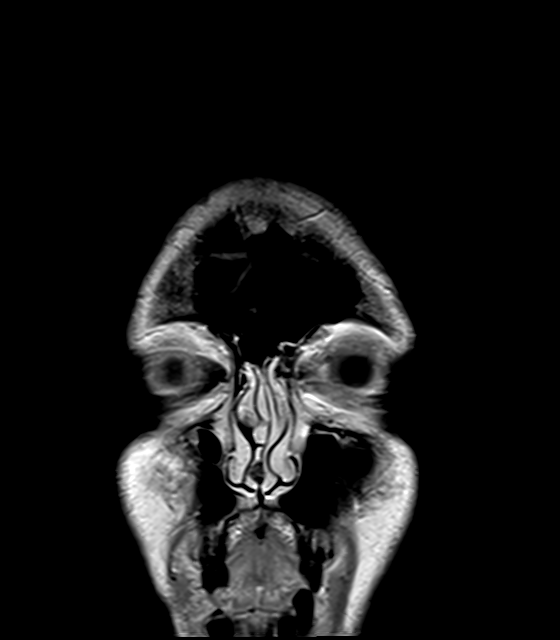

[Series 20: T1 post-contrast · sagittal · 5.0mm · 0.75mm/px · 2 of 25 slices shown (2 of 2)]
[im 1/25]
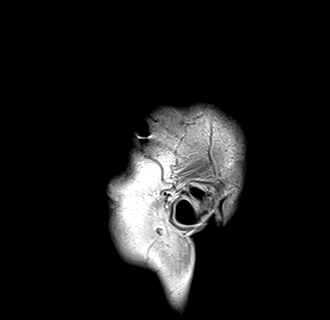
[im 25/25]
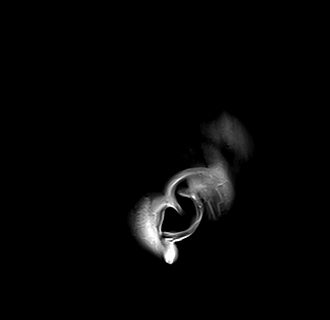

[40 of 48 positions shown; findings below may reference images not displayed]

FINDINGS: Brain: There is no acute infarct. There is hyperintensity on the
FLAIR sequence at the anterior left frontal lobe and anterior right
parietal lobe. These areas show mild contrast enhancement,
particularly the left frontal areas. No midline shift or other mass
effect. There is multifocal periventricular white matter
hyperintensity, most often a result of chronic microvascular
ischemia.

Vascular: Major flow voids are preserved.

Skull and upper cervical spine: Normal calvarium and skull base.
Visualized upper cervical spine and soft tissues are normal.

Sinuses/Orbits:No paranasal sinus fluid levels or advanced mucosal
thickening. No mastoid or middle ear effusion. Normal orbits.
IMPRESSION: 1. Areas of subarachnoid/pial contrast enhancement over both
convexities, left more than right. Reactive enhancement in the
setting of subarachnoid hemorrhage is favored. Other possibilities
include meningitis and subacute ischemia.
2. Findings of chronic microvascular ischemia.

## 2021-04-06 MED ORDER — GADOBUTROL 1 MMOL/ML IV SOLN
8.5000 mL | Freq: Once | INTRAVENOUS | Status: AC | PRN
  Administered 2021-04-06: 8.5 mL via INTRAVENOUS

## 2021-04-06 MED ORDER — LEVETIRACETAM IN NACL 500 MG/100ML IV SOLN
500.0000 mg | Freq: Once | INTRAVENOUS | Status: DC
  Filled 2021-04-06: qty 100

## 2021-04-06 MED ORDER — IOHEXOL 350 MG/ML SOLN
50.0000 mL | Freq: Once | INTRAVENOUS | Status: AC | PRN
  Administered 2021-04-06: 50 mL via INTRAVENOUS

## 2021-04-06 MED ORDER — SODIUM CHLORIDE 0.9% FLUSH: 3.0000 mL | Freq: Once | INTRAVENOUS | Status: DC

## 2021-04-06 MED ORDER — SODIUM CHLORIDE 0.9 % IV BOLUS
1000.0000 mL | Freq: Once | INTRAVENOUS | Status: AC
  Administered 2021-04-06: 1000 mL via INTRAVENOUS

## 2021-04-06 MED ORDER — LIDOCAINE HCL 2 % IJ SOLN
10.0000 mL | Freq: Once | INTRAMUSCULAR | Status: AC
  Administered 2021-04-06: 200 mg
  Filled 2021-04-06: qty 20

## 2021-04-06 MED ORDER — LEVETIRACETAM IN NACL 500 MG/100ML IV SOLN
500.0000 mg | Freq: Two times a day (BID) | INTRAVENOUS | Status: DC
  Administered 2021-04-06 – 2021-04-07 (×2): 500 mg via INTRAVENOUS
  Filled 2021-04-06 (×3): qty 100

## 2021-04-06 MED ORDER — DIPHENHYDRAMINE HCL 50 MG/ML IJ SOLN: 50.0000 mg | Freq: Once | INTRAMUSCULAR | Status: DC

## 2021-04-06 MED ORDER — METOCLOPRAMIDE HCL 5 MG/ML IJ SOLN: 10.0000 mg | Freq: Once | INTRAMUSCULAR | Status: DC

## 2021-04-06 MED ORDER — DEXAMETHASONE SODIUM PHOSPHATE 4 MG/ML IJ SOLN: 4.0000 mg | Freq: Once | INTRAMUSCULAR | Status: DC

## 2021-04-06 MED ORDER — MORPHINE SULFATE (PF) 4 MG/ML IV SOLN
4.0000 mg | Freq: Once | INTRAVENOUS | Status: AC
  Administered 2021-04-06: 4 mg via INTRAVENOUS
  Filled 2021-04-06: qty 1

## 2021-04-06 MED ORDER — ENOXAPARIN SODIUM 40 MG/0.4ML IJ SOSY
40.0000 mg | PREFILLED_SYRINGE | Freq: Every day | INTRAMUSCULAR | Status: DC
  Administered 2021-04-07 – 2021-04-08 (×3): 40 mg via SUBCUTANEOUS
  Filled 2021-04-06 (×3): qty 0.4

## 2021-04-06 NOTE — ED Triage Notes (Signed)
Pt c/o migraine that started 2hrs pta. A&O x 4, moves all extremities well. Also c/o dizziness x 2 days. Hx migraines.

## 2021-04-06 NOTE — Consult Note (Addendum)
Neurology Consultation Reason for Consult: Atypical CNS imaging Requesting Physician: Shirlyn Goltz  CC: "Uncontrollable punching"  History is obtained from: Patient, partner at bedside and chart review  HPI: Ralph Simpson is a 60 y.o. male with a past medical history significant for prediabetes, migraine headaches, avascular necrosis of bilateral hips (unknown etiology per patient) s/p free vascularized fibular grafting (1999).  He reports he has a longstanding history of migraine headaches since age 56.  About a year ago his headaches tended to be approximately once a month and have always been associated with visual disturbances (inability to see), inability to speak with retained ability to understand speech, as well as headache.  In the last 8 months he has started to have more frequent headaches (now currently twice a week).  He is also been having stiffening of the right upper extremity that he thought were muscle spasms.  However today he had a 5-minute episode of the right upper extremity uncontrollably punching and he was unable to stop this movement.  It was associated with his same migraine symptoms.  He denies any positional component to his headache and it is not worse any particular time of day.  He additionally has had some nonspecific dizziness for the past 2 days with mild improvement with Dramamine.  He notes that the dizziness is worse when he bends over.  Otherwise he denies any diplopia or blurred vision on lateral gaze.  He has not had any exotic travel, having gone only to [facility] recently.  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  ROS: All other review of systems was negative except as noted in the HPI.   Past Medical History:  Diagnosis Date  . Allergy   . GERD (gastroesophageal reflux disease)   . Migraine   He denies any personal history of autoimmune disease   Past Surgical History:  Procedure  Laterality Date  . HERNIA REPAIR    . HIP SURGERY     Current Outpatient Medications  Medication Instructions  . glucose blood (ACCU-CHEK AVIVA PLUS) test strip BLOOD SUGAR ONCE DAILY OR AS DIRECTED  . Misc Natural Products (BLOOD SUGAR BALANCE) TABS Oral  . Multiple Vitamins-Minerals (VITEYES AREDS FORMULA/LUTEIN PO) Oral  . OVER THE COUNTER MEDICATION Reishi Mushroom   . PANCREATIN PO Oral     Family History  Problem Relation Age of Onset  . Arthritis Mother   . Stroke Mother   . Diabetes Father   . Heart disease Father   . Hyperlipidemia Father   . Hypertension Father   . Kidney disease Maternal Grandfather   He denies any known family history of autoimmune diseases such as lupus or sarcoidosis.   Social History:  reports that he has never smoked. He has never used smokeless tobacco. He reports current alcohol use. He reports that he does not use drugs.  Exam: Current vital signs: BP 132/89 (BP Location: Right Arm)   Pulse 84   Temp 98.2 F (36.8 C) (Oral)   Resp 18   SpO2 98%  Vital signs in last 24 hours: Temp:  [98.2 F (36.8 C)] 98.2 F (36.8 C) (05/21 1714) Pulse Rate:  [84] 84 (05/21 1714) Resp:  [18] 18 (05/21 1714) BP: (132)/(89) 132/89 (05/21 1714) SpO2:  [98 %] 98 % (05/21 1714)   Physical Exam  Constitutional: Appears well-developed and well-nourished.  Psych: Affect appropriate to situation, calm and cooperative Eyes: No scleral  injection HENT: No oropharyngeal obstruction.  MSK: no joint deformities.  Cardiovascular: Normal rate and regular rhythm.  Respiratory: Effort normal, non-labored breathing GI: Soft.  No distension. There is no tenderness.  Skin: Warm dry and, scattered bug bites on bilateral lower extremities he attributes to mosquitoes  Neuro: Mental Status: Patient is awake, alert, oriented to person, place, month, year, and situation. Patient is able to give a clear and coherent history. No signs of aphasia or neglect  --occasional mild word finding difficulties in casual speech, but on formal testing he performs well.  He does have some mild issues with attention (names days of the week forwards instead of backwards, does not sequence multistep commands correctly) Cranial Nerves: II: Visual Fields are full. Pupils are equal, round, and reactive to light. 3 -> 1  III,IV, VI: EOMI without ptosis or diploplia.  V: Facial sensation is symmetric to temperature VII: Facial movement is symmetric.  VIII: hearing is intact to voice, bilaterally hard of hearing, chronic X: Uvula elevates symmetrically XI: Shoulder shrug is symmetric. XII: tongue is midline without atrophy or fasciculations.  Motor: Tone is normal. Bulk is normal. 5/5 strength was present throughout the upper extremities.  The lower extremities are significantly pain limited and 4+ throughout Sensory: Sensation is symmetric to light touch and temperature in the arms and legs.  No length dependent loss of temperature.  He does have reduced sensation of vibration in the left foot Deep Tendon Reflexes: 2+ and symmetric in the biceps and patellae.  Plantars: Toes are downgoing bilaterally.  Cerebellar: FNF and HKS are intact bilaterally  I have reviewed labs in epic and the results pertinent to this consultation are: CMP notable for mildly elevated glucose at 102, creatinine normal at 0.85 CBC within normal limits, PT/PTT/INR all within normal limits  I have reviewed the images obtained: Head CT personally reviewed, there is hypodensity in the high left frontal lobe concerning for potential SAH versus inflammatory/infectious process CTA personally reviewed, redemonstrated left frontal hypodensity without evidence of underlying vascular malformations or aneurysm MRI brain with leptomeningeal enhancement  Impression: Although patient presented with an acute event today, the history he provides this is a subacute progression with arm stiffening  starting first 8 months ago.  Although he has had migraine headaches since childhood, I think these events are triggering additional migraine headaches but these events are not primarily related to migraines.  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  Additionally the description of his right arm shaking activity is highly concerning for focal seizure.  Recommendations: - LP w/ opening pressure, cell counts tube 1&4, protein, glucose, OCB, IgG index, VZV IgG, HSV 1/2 PCR, VDRL, Fungal cultures, cryptococcal Ag, bacterial culture, cytology - CT chest/ab/pelvis for malignancy screening, w/o contrast given current shortage - Keppra 500 mg BID - Routine EEG  Lesleigh Noe MD-PhD [facility] [phone] Available 7 PM to 7 AM, outside of these hours please call Neurologist on call as listed on Amion.

## 2021-04-06 NOTE — Consult Note (Signed)
Neurosurgery Consultation  Reason for Consult: [facility] Referring Physician: Darl Householder  CC: Severe HA  HPI: This is a 60 y.o. man that presents with severe headache and RUE tonic clonic movement. The headache is better today, but was severe at onset, felt consistent with one of his migraines, not WHOL. The clonic movements were restricted to the RUE, said he was "shadow boxing" with that arm involuntarily. They were short duration, paroxysmal, no clear trigger, and have resolved since starting AEDs. No new weakness, numbness, or parasthesias, no recent change in bowel or bladder function. No recent use of anti-platelet or anti-coagulant medications.   ROS: A 14 point ROS was performed and is negative except as noted in the HPI.   PMHx:  Past Medical History:  Diagnosis Date  . Allergy   . GERD (gastroesophageal reflux disease)   . Migraine    FamHx:  Family History  Problem Relation Age of Onset  . Arthritis Mother   . Stroke Mother   . Diabetes Father   . Heart disease Father   . Hyperlipidemia Father   . Hypertension Father   . Kidney disease Maternal Grandfather    SocHx:  reports that he has never smoked. He has never used smokeless tobacco. He reports current alcohol use. He reports that he does not use drugs.  Exam: Vital signs in last 24 hours: Temp:  [97.5 F (36.4 C)-98.5 F (36.9 C)] 97.5 F (36.4 C) (05/22 1531) Pulse Rate:  [60-83] 68 (05/22 1531) Resp:  [11-20] 17 (05/22 1531) BP: (94-136)/(55-84) 107/68 (05/22 1531) SpO2:  [93 %-98 %] 94 % (05/22 1531) General: Awake, alert, cooperative, lying in bed in NAD Head: Normocephalic and atruamatic HEENT: Neck supple Pulmonary: breathing room air comfortably, no evidence of increased work of breathing Cardiac: RRR Abdomen: S NT ND Extremities: Warm and well perfused x4 Neuro: AOx3, PERRL, EOMI, FS Strength 5/5 x4, SILTx4, no drift   Assessment and Plan: 60 y.o. man w/ headache and focal RUE clonic movements. [facility]  personally reviewed, which shows left frontal convexity SAH, pt strongly denies h/o trauma. More concerning for RCVS, CVST, vasculitis, etc. CTA negative for vascular malformation / aneurysm.  -no acute neurosurgical intervention indicated at this time, likely nonsurgical pathology, defer further workup to neurology -please call with any concerns or questions  Judith Part, MD 04/07/21 5:17 PM [facility]

## 2021-04-06 NOTE — ED Provider Notes (Addendum)
[facility] Provider Note   CSN: [id number] Arrival date & time: 04/06/21  1711     History Chief Complaint  Patient presents with  . Migraine    [facility] is a 60 y.o. male history of migraines here presenting with worsening headaches and right-sided tremors.  Patient states that he woke up at 2 AM with sudden onset of headache.  He states that this morning he noticed tremors in the right leg.  He also has dizziness for the last 2 days.  Patient has history of migraines and took 2 Excedrin with no relief so came here for evaluation.  The history is provided by the patient.       Past Medical History:  Diagnosis Date  . Allergy   . GERD (gastroesophageal reflux disease)   . Migraine     Patient Active Problem List   Diagnosis Date Noted  . Localization-related focal epilepsy with complex partial seizures ([place]) 04/06/2021  . Headache 04/06/2021  . 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 GERD (gastroesophageal reflux disease) 01/27/2020  . Migraines 01/27/2020  . Prediabetes 01/27/2020  . AVN (avascular necrosis of bone) ([place]) 02/12/2012    Past Surgical History:  Procedure Laterality Date  . HERNIA REPAIR    . HIP SURGERY         Family History  Problem Relation Age of Onset  . Arthritis Mother   . Stroke Mother   . Diabetes Father   . Heart disease Father   . Hyperlipidemia Father   . Hypertension Father   . Kidney disease Maternal Grandfather     Social History   Tobacco Use  . Smoking status: Never Smoker  . Smokeless tobacco: Never Used  Substance Use Topics  . Alcohol use: Yes    Comment: occasional  . Drug use: No    Home Medications Prior to Admission medications   Medication Sig Start Date End Date Taking? Authorizing Provider  aspirin-acetaminophen-caffeine (EXCEDRIN MIGRAINE) [id number] MG tablet Take 2 tablets by mouth every 6 (six) hours as needed for headache or migraine.    Yes [provider]  glucose blood (ACCU-CHEK AVIVA PLUS) test strip BLOOD SUGAR ONCE DAILY OR AS DIRECTED 02/08/21  Yes Baity, Coralie Keens, NP  Multiple Vitamins-Minerals (PRESERVISION AREDS PO) Take 1 tablet by mouth in the morning and at bedtime.   Yes [provider]  OVER THE COUNTER MEDICATION Take 1 capsule by mouth with breakfast, with lunch, and with evening meal. Reishi Mushroom   Yes [provider]  OVER THE COUNTER MEDICATION Take 2 capsules by mouth with breakfast, with lunch, and with evening meal. Natural Glucocil- total blood sugar   Yes [provider]  OVER THE COUNTER MEDICATION Take 1 tablet by mouth daily. bosweilla   Yes [provider]    Allergies    Patient has no known allergies.  Review of Systems   Review of Systems  Neurological: Positive for headaches.  All other systems reviewed and are negative.   Physical Exam Updated Vital Signs BP 118/75   Pulse 78   Temp 98.2 F (36.8 C) (Oral)   Resp 14   SpO2 94%   Physical Exam Vitals and nursing note reviewed.  Constitutional:      Appearance: Normal appearance.     Comments: Uncomfortable   HENT:     Head: Normocephalic.     Nose: Nose normal.     Mouth/Throat:  Mouth: Mucous membranes are dry.  Eyes:     Extraocular Movements: Extraocular movements intact.     Pupils: Pupils are equal, round, and reactive to light.  Cardiovascular:     Rate and Rhythm: Normal rate and regular rhythm.     Pulses: Normal pulses.     Heart sounds: Normal heart sounds.  Abdominal:     General: Abdomen is flat.     Palpations: Abdomen is soft.  Musculoskeletal:        General: Normal range of motion.     Cervical back: Normal range of motion and neck supple.  Skin:    General: Skin is warm.     Capillary Refill: Capillary refill takes less than 2 seconds.  Neurological:     Mental Status: He is alert.     Comments: Patient strength is 4 out of 5 on the right arm and  leg.  Patient is unable to stand due to dizziness and baseline hip pain.  Patient has normal reflexes throughout.  Psychiatric:        Mood and Affect: Mood normal.        Behavior: Behavior normal.     ED Results / Procedures / Treatments   Labs (all labs ordered are listed, but only abnormal results are displayed) Labs Reviewed  COMPREHENSIVE METABOLIC PANEL - Abnormal; Notable for the following components:      Result Value   Glucose, Bld 102 (*)    All other components within normal limits  I-STAT CHEM 8, ED - Abnormal; Notable for the following components:   Glucose, Bld 100 (*)    Calcium, Ion 1.12 (*)    All other components within normal limits  CSF CULTURE W GRAM STAIN  CULTURE, FUNGUS WITHOUT SMEAR  PROTIME-INR  APTT  CBC  DIFFERENTIAL  CSF CELL COUNT WITH DIFFERENTIAL  CSF CELL COUNT WITH DIFFERENTIAL  PROTEIN AND GLUCOSE, CSF  HIV ANTIBODY (ROUTINE TESTING W REFLEX)  VDRL, CSF  OLIGOCLONAL BANDS, CSF + SERM  DRAW EXTRA CLOT TUBE  CRYPTOCOCCAL ANTIGEN, CSF  HSV 1/2 PCR, CSF  RPR  MISC [facility] TEST (SEND OUT)  IGG CSF INDEX  DRAW EXTRA CLOT TUBE  BASIC METABOLIC PANEL  CBG MONITORING, ED  CYTOLOGY - NON PAP    EKG EKG Interpretation  Date/Time:  Saturday Apr 06 2021 17:18:24 EDT Ventricular Rate:  83 PR Interval:  162 QRS Duration: 88 QT Interval:  358 QTC Calculation: 420 R Axis:   20 Text Interpretation: Normal sinus rhythm Normal ECG No significant change since last tracing Confirmed by Wandra Arthurs [phone]) on 04/06/2021 6:04:53 PM   Radiology CT HEAD WO CONTRAST  Result Date: 04/06/2021 CLINICAL DATA:  Dizziness, memory loss.  History of migraines EXAM: CT HEAD WITHOUT CONTRAST TECHNIQUE: Contiguous axial images were obtained from the base of the skull through the vertex without intravenous contrast. COMPARISON:  None. FINDINGS: Brain: Increased density material within the subarachnoid spaces of the high left frontal lobe extending over  approximately a 5.3 cm area (series 3, images 26-29). No definite underlying mass lesion. No edema. No hydrocephalus. No large territory acute infarction. Vascular: No hyperdense vessel or unexpected calcification. Skull: Normal. Negative for fracture or focal lesion. Sinuses/Orbits: No acute finding. Other: None. IMPRESSION: Findings suspicious for acute subarachnoid hemorrhage in the high left frontal lobe. No definite underlying mass lesion. Further evaluation with MRI of the brain is recommended. Critical Value/emergent results were called by telephone at the time of interpretation on 04/06/2021 at  6:13 pm to provider Dr Darl Householder, who verbally acknowledged these results. Electronically Signed   By: Davina Poke D.O.   On: 04/06/2021 18:14    Procedures .Lumbar Puncture  Date/Time: 04/06/2021 11:46 PM Performed by: Drenda Freeze, MD Authorized by: Drenda Freeze, MD   Consent:    Consent obtained:  Verbal   Consent given by:  Patient   Risks discussed:  Bleeding and infection   Alternatives discussed:  No treatment Universal protocol:    Procedure explained and questions answered to patient or proxy's satisfaction: yes     Relevant documents present and verified: yes     Test results available: yes     Imaging studies available: yes     Required blood products, implants, devices, and special equipment available: yes     Patient identity confirmed:  Verbally with patient Pre-procedure details:    Procedure purpose:  Diagnostic   Preparation: Patient was prepped and draped in usual sterile fashion   Anesthesia:    Anesthesia method:  None Procedure details:    Lumbar space:  L3-L4 interspace   Patient position:  L lateral decubitus   Needle gauge:  22   Needle type:  Diamond point   Needle length (in):  3.5   Ultrasound guidance: no     Number of attempts:  1   Opening pressure (cm H2O):  11   Fluid appearance:  Clear   Tubes of fluid:  4   Total volume (ml):   24 Post-procedure details:    Puncture site:  Adhesive bandage applied   Procedure completion:  Tolerated    CRITICAL CARE Performed by: Wandra Arthurs   Total critical care time:30 minutes  Critical care time was exclusive of separately billable procedures and treating other patients.  Critical care was necessary to treat or prevent imminent or life-threatening deterioration.  Critical care was time spent personally by me on the following activities: development of treatment plan with patient and/or surrogate as well as nursing, discussions with consultants, evaluation of patient's response to treatment, examination of patient, obtaining history from patient or surrogate, ordering and performing treatments and interventions, ordering and review of laboratory studies, ordering and review of radiographic studies, pulse oximetry and re-evaluation of patient's condition.    Medications Ordered in ED Medications  sodium chloride flush (NS) 0.9 % injection 3 mL (3 mLs Intravenous Not Given 04/06/21 1812)  levETIRAcetam (KEPPRA) IVPB 500 mg/100 mL premix (500 mg Intravenous New Bag/Given 04/06/21 2252)  enoxaparin (LOVENOX) injection 40 mg (has no administration in time range)  sodium chloride 0.9 % bolus 1,000 mL (1,000 mLs Intravenous New Bag/Given 04/06/21 1833)  morphine 4 MG/ML injection 4 mg (4 mg Intravenous Given 04/06/21 1835)  iohexol (OMNIPAQUE) 350 MG/ML injection 50 mL (50 mLs Intravenous Contrast Given 04/06/21 1915)  gadobutrol (GADAVIST) 1 MMOL/ML injection 8.5 mL (8.5 mLs Intravenous Contrast Given 04/06/21 2008)  lidocaine (XYLOCAINE) 2 % (with pres) injection 200 mg (200 mg Other Given by Other 04/06/21 2254)    ED Course  I have reviewed the triage vital signs and the nursing notes.  Pertinent labs & imaging results that were available during my care of the patient were reviewed by me and considered in my medical decision making (see chart for details).    MDM  Rules/Calculators/A&P                         [facility] is a 60 y.o.  male here presenting with headache.  Patient has sudden onset of headache started at 2 AM. Concern for possible subarachnoid versus migraines. Plan to get CT head and give migraine cocktail   6:30 pm CT showed possible subarachnoid in the left parietal area.  I talked to Dr. Venetia Constable from neurosurgery.  He states that he will see the patient and recommend neurology evaluation as well.  He also recommended CTA head and neck.  I discussed case with Dr. Malen Gauze from neurology.  He recommended MRI with and without contrast.   11:47 PM CTA was unremarkable.  MRI showed multiple subarachnoid enhancement.  Consideration was for subacute ischemia versus meningitis.  Patient has no meningeal signs.  Patient was seen by Dr. Curly Shores from neurology.  She actually gets more of a history of intermittent left-sided tremors and was concerned for partial seizures.  She is also concerned for possible fungal infection of the brain versus herpes.  She is not concerned for bacterial meningitis.  She requests CSF studies with fungal cultures and HSV cultures.  She states that neurology will follow along.  She does recommend admission for further evaluation. I was able to perform LP and obtained 24 cc of clear CSF. Hospitalist to admit    Final Clinical Impression(s) / ED Diagnoses Final diagnoses:  Subarachnoid bleed [facility])    Rx / DC Orders ED Discharge Orders    None       Drenda Freeze, MD 04/06/21 2309    Drenda Freeze, MD 04/06/21 [phone]

## 2021-04-06 NOTE — ED Notes (Signed)
Pt in CT at this time.

## 2021-04-06 NOTE — ED Notes (Signed)
CT to bring pt to H20 when finished

## 2021-04-06 NOTE — ED Notes (Signed)
Pt back from CT

## 2021-04-07 ENCOUNTER — Inpatient Hospital Stay (HOSPITAL_COMMUNITY): Payer: Medicare Other

## 2021-04-07 DIAGNOSIS — R9089 Other abnormal findings on diagnostic imaging of central nervous system: Secondary | ICD-10-CM | POA: Diagnosis not present

## 2021-04-07 DIAGNOSIS — R519 Headache, unspecified: Secondary | ICD-10-CM | POA: Diagnosis not present

## 2021-04-07 LAB — CSF CELL COUNT WITH DIFFERENTIAL
RBC Count, CSF: 203 /mm3 — ABNORMAL HIGH
RBC Count, CSF: 2225 /mm3 — ABNORMAL HIGH
Tube #: 1
Tube #: 4
WBC, CSF: 2 /mm3 (ref 0–5)
WBC, CSF: 2 /mm3 (ref 0–5)

## 2021-04-07 LAB — BASIC METABOLIC PANEL
Anion gap: 6 (ref 5–15)
BUN: 14 mg/dL (ref 8–23)
CO2: 25 mmol/L (ref 22–32)
Calcium: 8.7 mg/dL — ABNORMAL LOW (ref 8.9–10.3)
Chloride: 106 mmol/L (ref 98–111)
Creatinine, Ser: 0.86 mg/dL (ref 0.61–1.24)
GFR, Estimated: >60 mL/min (ref >60)
Glucose, Bld: 124 mg/dL — ABNORMAL HIGH (ref 70–99)
Potassium: 3.6 mmol/L (ref 3.5–5.1)
Sodium: 137 mmol/L (ref 135–145)

## 2021-04-07 LAB — CRYPTOCOCCAL ANTIGEN, CSF: Crypto Ag: NEGATIVE

## 2021-04-07 LAB — PROTEIN AND GLUCOSE, CSF
Glucose, CSF: 64 mg/dL (ref 40–70)
Total  Protein, CSF: 51 mg/dL — ABNORMAL HIGH (ref 15–45)

## 2021-04-07 LAB — MRSA PCR SCREENING: MRSA by PCR: NEGATIVE

## 2021-04-07 LAB — RPR: RPR Ser Ql: NONREACTIVE

## 2021-04-07 LAB — HIV ANTIBODY (ROUTINE TESTING W REFLEX): HIV Screen 4th Generation wRfx: NONREACTIVE

## 2021-04-07 LAB — SARS CORONAVIRUS 2 (TAT 6-24 HRS): SARS Coronavirus 2: NEGATIVE

## 2021-04-07 IMAGING — CT CT CHEST-ABD-PELV W/O CM
2 of 4 series · 13 of 36 positions shown, 15 images · non-contrast
Comparison: Remote abdominopelvic CT [DATE]

CLINICAL DATA: Cancer of unknown primary, staging

Brain lesion.
EXAM:
CT CHEST, ABDOMEN AND PELVIS WITHOUT CONTRAST
TECHNIQUE: Multidetector CT imaging of the chest, abdomen and pelvis was
performed following the standard protocol without IV contrast.

[Series 3: cap wo 5.0 i31f 2 · axial · 0.93mm/px · z∈[+1086,+1726]mm · 10 of 158 slices shown, 12 images]
[im 15/158  mediastinal]
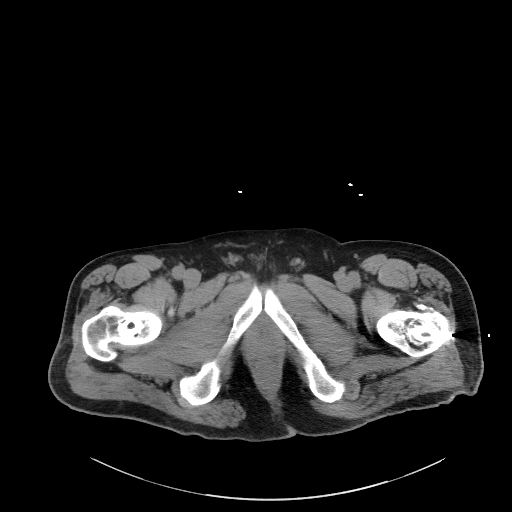
[im 15/158  bone]
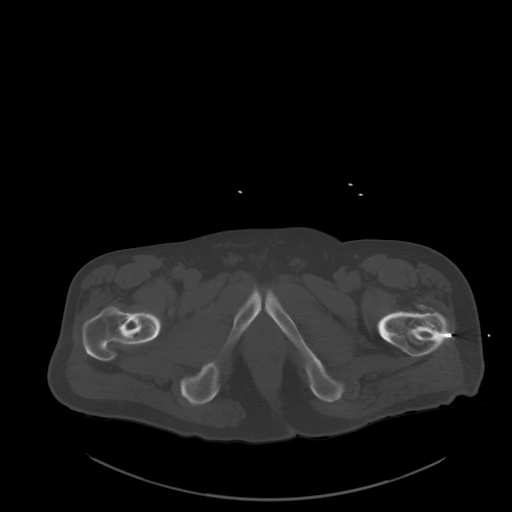
[im 29/158  mediastinal]
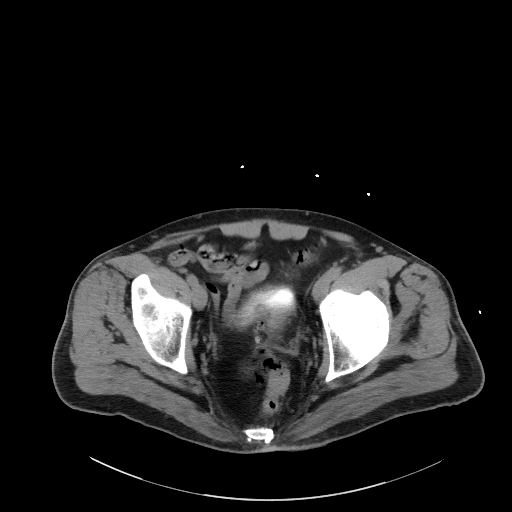
[im 43/158  mediastinal]
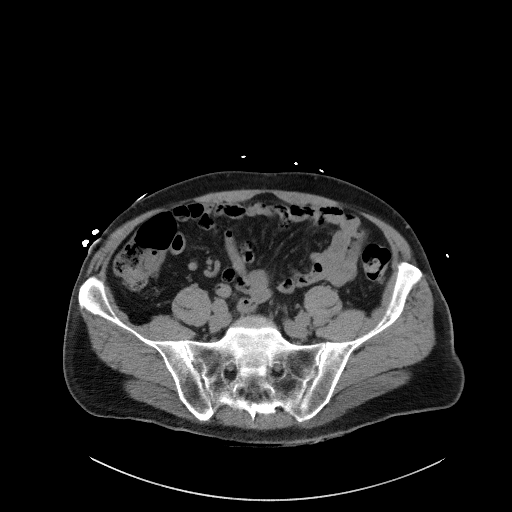
[im 58/158  mediastinal]
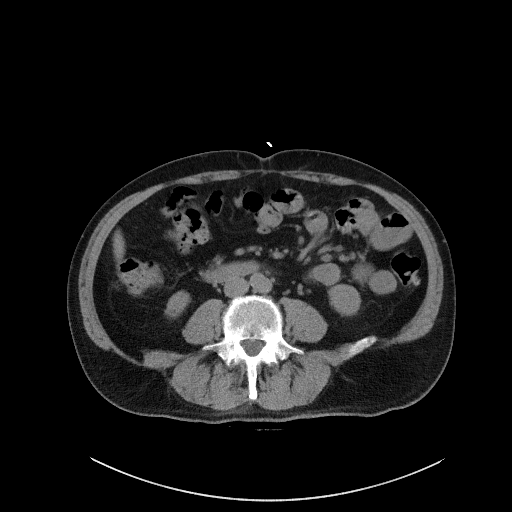
[im 72/158  mediastinal]
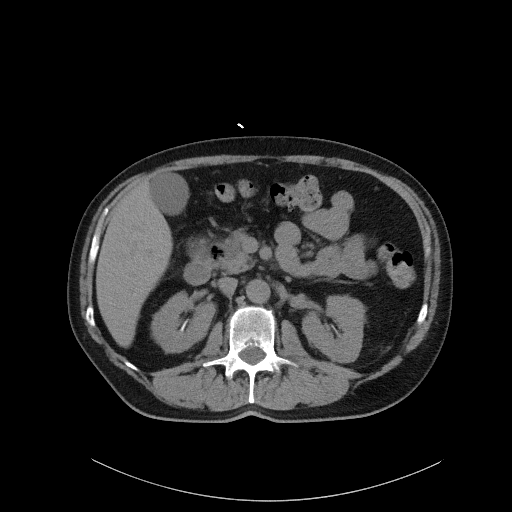
[im 86/158  mediastinal]
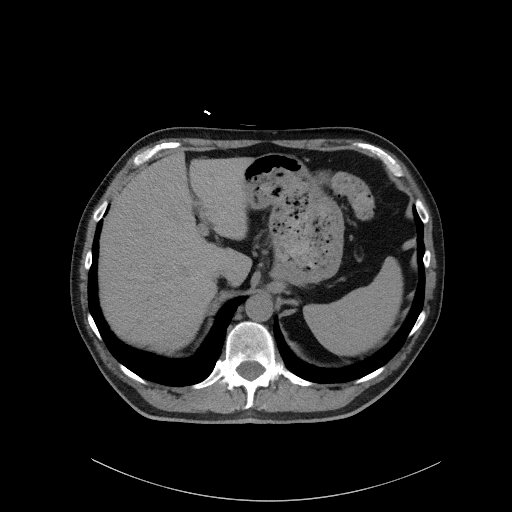
[im 100/158  mediastinal]
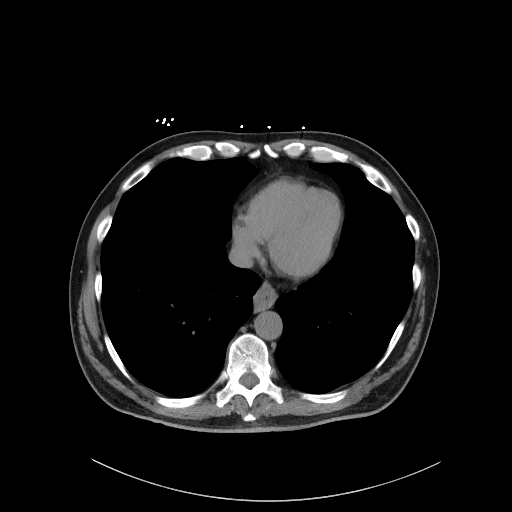
[im 115/158  mediastinal]
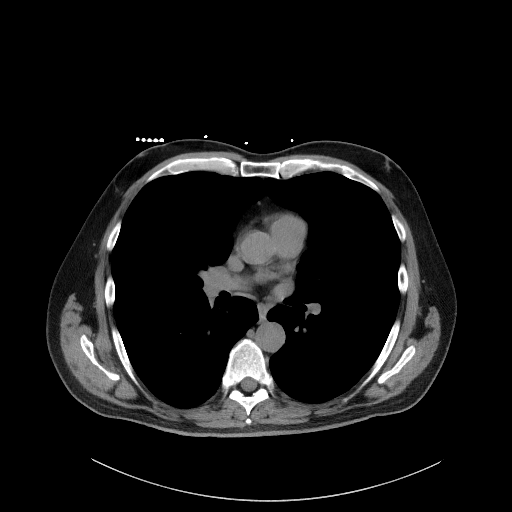
[im 129/158  mediastinal]
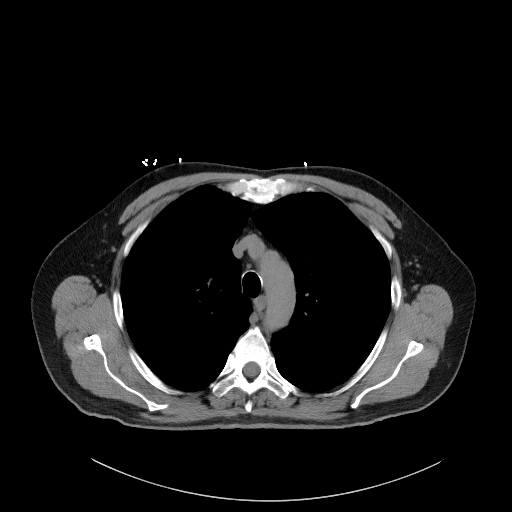
[im 129/158  bone]
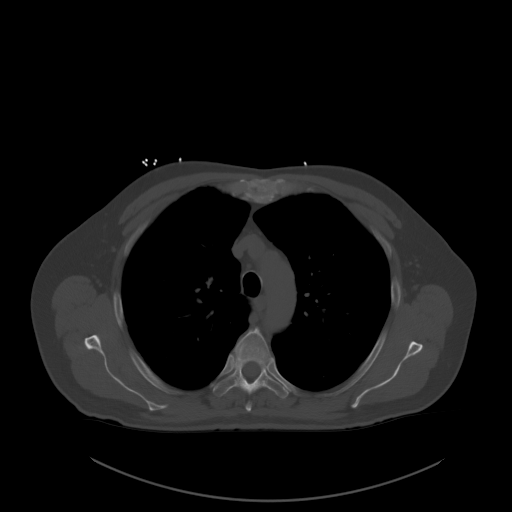
[im 143/158  mediastinal]
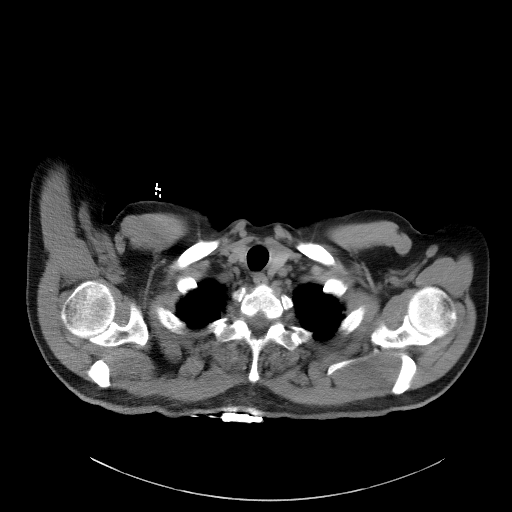

[Series 6: coronal · coronal · 0.85mm/px · 3 of 142 slices shown]
[im 29/142  mediastinal]
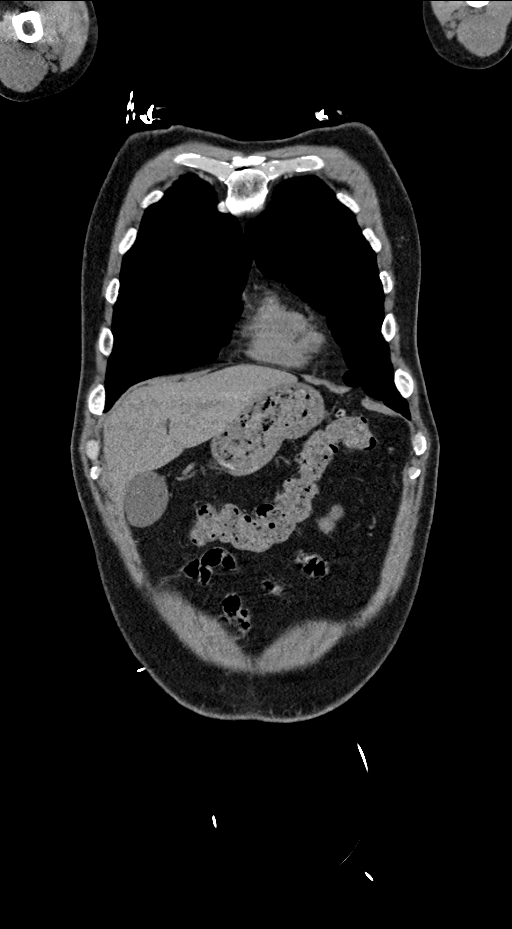
[im 57/142  mediastinal]
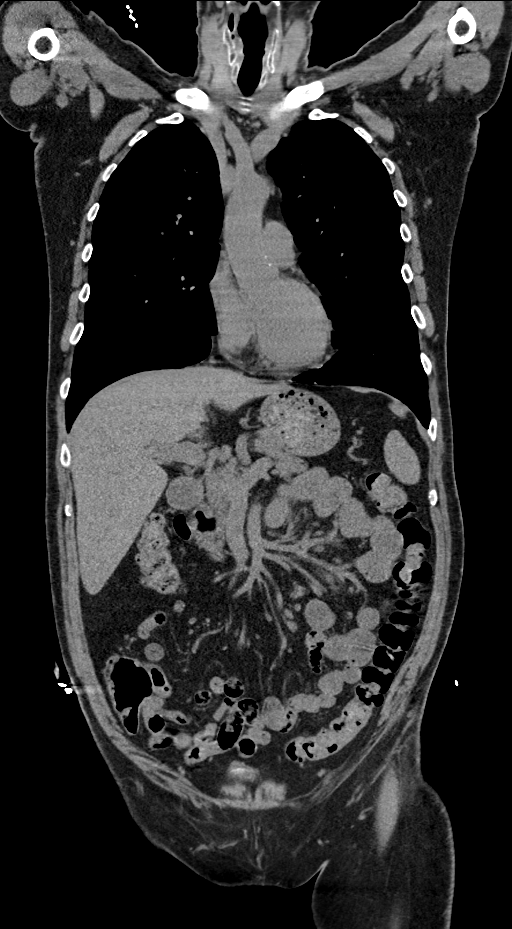
[im 85/142  mediastinal]
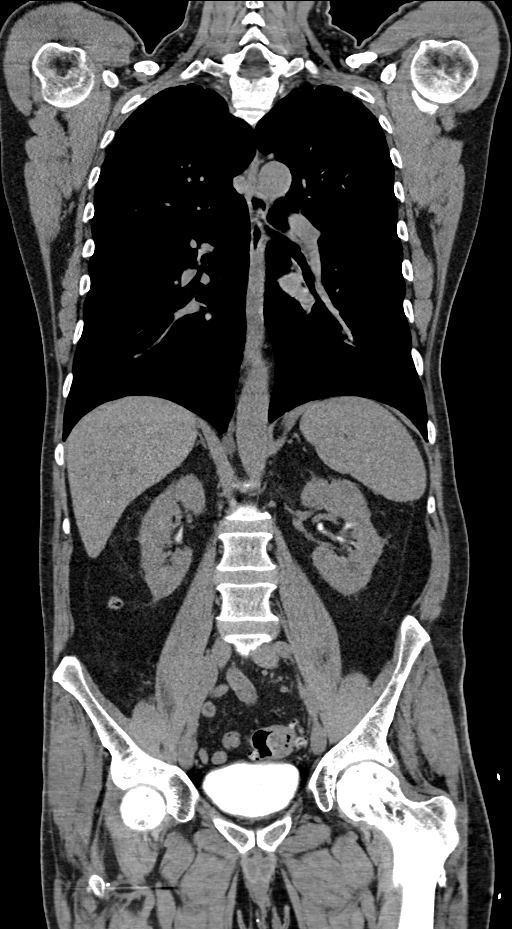

[13 of 36 positions shown; findings below may reference images not displayed]

FINDINGS: CT CHEST FINDINGS

Cardiovascular: Mild aortic atherosclerosis without aneurysm. Normal
heart size. No pericardial effusion.

Mediastinum/Nodes: Moderate-sized hiatal hernia with irregular wall
thickening of the herniated stomach. No proximal esophageal
dilatation. No visualized thyroid nodule. No enlarged mediastinal
lymph nodes. Limited hilar assessment on this noncontrast exam.

Lungs/Pleura: Mild biapical pleuroparenchymal scarring. No acute or
focal airspace disease. Occasional areas of fissural thickening of
the left inter lobar fissure without discrete pulmonary nodule.
There is no pulmonary mass. Trachea and central bronchi are patent.
No pleural fluid.

Musculoskeletal: No focal bone lesion or acute osseous abnormality.
No chest wall soft tissue abnormality.

CT ABDOMEN PELVIS FINDINGS

Hepatobiliary: Assessment for liver lesions is limited on this
noncontrast exam. Allowing for this, no focal hepatic abnormality is
seen. Small gallstone within the gallbladder without pericholecystic
inflammation or biliary dilatation.

Pancreas: No ductal dilatation or inflammation. No evidence of
pancreatic mass on this noncontrast exam.

Spleen: Normal in size without focal abnormality.

Adrenals/Urinary Tract: Normal adrenal glands. There is excreted IV
contrast in both renal collecting systems from prior head and neck
CTA. 3.8 cm simple cyst in the upper left kidney. 2.8 cm simple cyst
in the anterior mid left kidney. No evidence of solid renal lesion.
Excreted IV contrast in the urinary bladder, no bladder wall
thickening.

Stomach/Bowel: Lack of enteric contrast limits detailed assessment.
Small to moderate hiatal hernia with wall thickening of the
herniated stomach. Ingested material within the stomach. There is a
duodenal diverticulum without acute inflammation. No small bowel
obstruction or inflammation. No obvious small bowel lesion. Normal
appendix. Multifocal colonic diverticulosis. No evidence of
diverticulitis. Moderate colonic stool burden. No obvious colonic
mass on this unenhanced exam. There is mild mural hypertrophy of the
sigmoid.

Vascular/Lymphatic: Aortic and branch atherosclerosis. No aortic
aneurysm. Small periportal and retroperitoneal lymph nodes are not
enlarged by size criteria.

Reproductive: Enlarged prostate gland spanning 5.8 cm.

Other: No ascites. No free air. No evidence of omental disease. Tiny
fat containing umbilical hernia. No subcutaneous soft tissue
abnormality.

Musculoskeletal: Postsurgical change in both proximal femoral with
ghost tracks from decompression for femoral head avascular necrosis.
K-wires remain in place. No focal bone lesion.
IMPRESSION: 1. Moderate-sized hiatal hernia with irregular wall thickening of
the herniated stomach. Endoscopy recommended to exclude neoplasm.
2. No other evidence of primary malignancy or metastatic disease in
the chest, abdomen, or pelvis on this noncontrast exam.
3. Colonic diverticulosis without diverticulitis.
4. Enlarged prostate gland.
5. Cholelithiasis without gallbladder inflammation.

Aortic Atherosclerosis ([QE]-[QE]).

## 2021-04-07 MED ORDER — PROCHLORPERAZINE EDISYLATE 10 MG/2ML IJ SOLN: 10.0000 mg | Freq: Four times a day (QID) | INTRAMUSCULAR | Status: DC | PRN

## 2021-04-07 MED ORDER — IBUPROFEN 200 MG PO TABS
400.0000 mg | ORAL_TABLET | Freq: Once | ORAL | Status: AC
  Administered 2021-04-07: 400 mg via ORAL
  Filled 2021-04-07: qty 2

## 2021-04-07 MED ORDER — LEVETIRACETAM 500 MG PO TABS
500.0000 mg | ORAL_TABLET | Freq: Two times a day (BID) | ORAL | Status: AC
  Administered 2021-04-07 – 2021-04-09 (×3): 500 mg via ORAL
  Filled 2021-04-07 (×3): qty 1

## 2021-04-07 NOTE — ED Notes (Signed)
Pt ambulated self to bathroom

## 2021-04-07 NOTE — ED Notes (Signed)
RN attempted to call report x1 

## 2021-04-07 NOTE — H&P (Addendum)
History and Physical    Ralph Simpson IOE:[id number] DOB: 1961-08-03 DOA: 04/06/2021  PCP: Pcp, No  Patient coming from: Home, significant other at bedside  I have personally briefly reviewed patient's old medical records in [place]  Chief Complaint: headache, right UE jerking  HPI: Ralph Simpson is a 60 y.o. male with medical history significant for migraine, lateral avascular necrosis of the hips s/p free vascularized fibular grafting, prediabetes and GERD who presents with worsening headache and right upper extremity jerking. Exam patient has history of longstanding migraines since childhood.  He states that about 2 AM this morning he awoke with severe frontal throbbing headache that is somewhat worse than usual.  Also has been dizzy for the past 2 days.  Notes his headache has been more frequent in the past few months.  Usually takes Excedrin or sometimes Motrin.  His migraine comes with vision changes of lightning and flashing lights.  He also is unable to speak during his migraine headaches but able to understand speech.  For the past 8 months he has also noted involuntary jerking of his right hand.  Today he had another episode for about 15 minutes prompting him to present to the ED.  He does not take any prescription medication.  States he only recently went to [employer] for a wedding.  He went [place] hunting about 2 weeks ago and had several bug bites but denies any new rashes afterwards.   ED Course: He was afebrile normotensive.  CBC and CMP unremarkable.CT Head initially showed concerns for acute subarachnoid hemorrhage in the high left frontal lobe.  CTA head and neck was recommended by neurosurgery and redemonstrated left frontal hypodensity without evidence of vascular malformation or aneurysm.  MRI brain with leptomeningeal enhancement.  Neurology Dr.Bhagat evaluated patient at bedside and was concerned for infectious/inflammatory process including occult malignancy,  fungal infection.  LP with fluid studies was performed by ED physician Dr. Darl Householder.  He was started on Keppra 500 BID and routine EEG was ordered for concerns of focal seizures with new involuntary right UE jerkings.  Review of Systems:  Constitutional: No Weight Change, No Fever ENT/Mouth: No sore throat, No Rhinorrhea Eyes: No Eye Pain,+ Vision Changes Cardiovascular: No Chest Pain, no SOB Respiratory: No Cough, No Sputum Gastrointestinal: + Nausea, No Vomiting, No Diarrhea, No Constipation, No Pain Genitourinary: no Urinary Incontinence, No Urgency, No Flank Pain Musculoskeletal: No Arthralgias, No Myalgias Skin: No Skin Lesions, No Pruritus, Neuro: no Weakness, No Numbness,  No Loss of Consciousness, No Syncope Psych: No Anxiety/Panic, No Depression, no decrease appetite Heme/Lymph: No Bruising, No Bleeding  Past Medical History:  Diagnosis Date  . Allergy   . GERD (gastroesophageal reflux disease)   . Migraine     Past Surgical History:  Procedure Laterality Date  . HERNIA REPAIR    . HIP SURGERY       reports that he has never smoked. He has never used smokeless tobacco. He reports current alcohol use. He reports that he does not use drugs. Social History  No Known Allergies  Family History  Problem Relation Age of Onset  . Arthritis Mother   . Stroke Mother   . Diabetes Father   . Heart disease Father   . Hyperlipidemia Father   . Hypertension Father   . Kidney disease Maternal Grandfather      Prior to Admission medications   Medication Sig Start Date End Date Taking? Authorizing Provider  aspirin-acetaminophen-caffeine (EXCEDRIN MIGRAINE) [phone] MG tablet  Take 2 tablets by mouth every 6 (six) hours as needed for headache or migraine.   Yes [provider]  glucose blood (ACCU-CHEK AVIVA PLUS) test strip BLOOD SUGAR ONCE DAILY OR AS DIRECTED 02/08/21  Yes Baity, Coralie Keens, NP  Multiple Vitamins-Minerals (PRESERVISION AREDS PO) Take 1 tablet by mouth in  the morning and at bedtime.   Yes [provider]  OVER THE COUNTER MEDICATION Take 1 capsule by mouth with breakfast, with lunch, and with evening meal. Reishi Mushroom   Yes [provider]  OVER THE COUNTER MEDICATION Take 2 capsules by mouth with breakfast, with lunch, and with evening meal. Natural Glucocil- total blood sugar   Yes [provider]  OVER THE COUNTER MEDICATION Take 1 tablet by mouth daily. bosweilla   Yes [provider]    Physical Exam: Vitals:   04/06/21 1714 04/06/21 2035 04/06/21 2230 04/06/21 2245  BP: 132/89 136/84 119/80 118/75  Pulse: 84 75 83 78  Resp: 18 14 19 14   Temp: 98.2 F (36.8 C)     TempSrc: Oral     SpO2: 98% 98% 96% 94%    Constitutional: NAD, calm, comfortable, male with long white beard sitting upright in bed eating a sandwich Vitals:   04/06/21 1714 04/06/21 2035 04/06/21 2230 04/06/21 2245  BP: 132/89 136/84 119/80 118/75  Pulse: 84 75 83 78  Resp: 18 14 19 14   Temp: 98.2 F (36.8 C)     TempSrc: Oral     SpO2: 98% 98% 96% 94%   Eyes: PERRL, lids and conjunctivae normal ENMT: Mucous membranes are moist.  Neck: normal, supple Respiratory: clear to auscultation bilaterally, no wheezing, no crackles. Normal respiratory effort. No accessory muscle use.  Cardiovascular: Regular rate and rhythm, no murmurs / rubs / gallops. No extremity edema.   Abdomen: no tenderness, no masses palpated.  Bowel sounds positive.  Musculoskeletal: no clubbing / cyanosis. No joint deformity upper and lower extremities. Good ROM, no contractures. Normal muscle tone.  Skin: no rashes, lesions, ulcers. No induration Neurologic: CN 2-12 grossly intact. Sensation intact,  Strength 5/5 in all 4.  Intact heel-to-shin.  Intact finger-nose. Psychiatric: Normal judgment and insight. Alert and oriented x 3. Normal mood.     Labs on Admission: I have personally reviewed following labs and imaging studies  CBC: Recent Labs  Lab  04/06/21 1810 04/06/21 1829  WBC 4.7  --   NEUTROABS 2.6  --   HGB 15.1 14.6  HCT 43.9 43.0  MCV 85.2  --   PLT 218  --    Basic Metabolic Panel: Recent Labs  Lab 04/06/21 1810 04/06/21 1829  NA 137 138  K 3.8 3.8  CL 104 102  CO2 26  --   GLUCOSE 102* 100*  BUN 18 21  CREATININE 0.85 0.90  CALCIUM 9.3  --    GFR: CrCl cannot be calculated (Unknown ideal weight.). Liver Function Tests: Recent Labs  Lab 04/06/21 1810  AST 20  ALT 22  ALKPHOS 61  BILITOT 0.6  PROT 7.2  ALBUMIN 4.2   No results for input(s): LIPASE, AMYLASE in the last 168 hours. No results for input(s): AMMONIA in the last 168 hours. Coagulation Profile: Recent Labs  Lab 04/06/21 1810  INR 1.0   Cardiac Enzymes: No results for input(s): CKTOTAL, CKMB, CKMBINDEX, TROPONINI in the last 168 hours. BNP (last 3 results) No results for input(s): PROBNP in the last 8760 hours. HbA1C: No results for input(s): HGBA1C in  the last 72 hours. CBG: No results for input(s): GLUCAP in the last 168 hours. Lipid Profile: No results for input(s): CHOL, HDL, LDLCALC, TRIG, CHOLHDL, LDLDIRECT in the last 72 hours. Thyroid Function Tests: No results for input(s): TSH, T4TOTAL, FREET4, T3FREE, THYROIDAB in the last 72 hours. Anemia Panel: No results for input(s): VITAMINB12, FOLATE, FERRITIN, TIBC, IRON, RETICCTPCT in the last 72 hours. Urine analysis: No results found for: COLORURINE, APPEARANCEUR, LABSPEC, [place], GLUCOSEU, [place], BILIRUBINUR, KETONESUR, PROTEINUR, UROBILINOGEN, NITRITE, LEUKOCYTESUR  Radiological Exams on Admission: CT HEAD WO CONTRAST  Result Date: 04/06/2021 CLINICAL DATA:  Dizziness, memory loss.  History of migraines EXAM: CT HEAD WITHOUT CONTRAST TECHNIQUE: Contiguous axial images were obtained from the base of the skull through the vertex without intravenous contrast. COMPARISON:  None. FINDINGS: Brain: Increased density material within the subarachnoid spaces of the high left  frontal lobe extending over approximately a 5.3 cm area (series 3, images 26-29). No definite underlying mass lesion. No edema. No hydrocephalus. No large territory acute infarction. Vascular: No hyperdense vessel or unexpected calcification. Skull: Normal. Negative for fracture or focal lesion. Sinuses/Orbits: No acute finding. Other: None. IMPRESSION: Findings suspicious for acute subarachnoid hemorrhage in the high left frontal lobe. No definite underlying mass lesion. Further evaluation with MRI of the brain is recommended. Critical Value/emergent results were called by telephone at the time of interpretation on 04/06/2021 at 6:13 pm to provider Dr Darl Householder, who verbally acknowledged these results. Electronically Signed   By: Davina Poke D.O.   On: 04/06/2021 18:14      Assessment/Plan  Headache with hx of Migraine -pt had CT head, CTA head and neck with findings of left frontal hypodensity without any vascular malformation.  MRI brain with leptomeningeal enhancement. -Neurology has evaluated patient and is concerned about infectious/inflammatory process including occult malignancy and fungal infection -LP has been obtainedw/ opening pressure, cell counts tube 1&4, protein, glucose, OCB, IgG index, VZV IgG, HSV 1/2 PCR, VDRL, Fungal cultures, cryptococcal Ag, bacterial culture, cytology - CT chest/ab/pelvis ordered for maligancy screening  Complex partial seizure -Right upper extremity involuntary rhythmic movements concerning for focal seizure -Keppra 500 mg BID has been started by neurology -Routine EEG pending  DVT prophylaxis:.Lovenox Code Status: Full Family Communication: Plan discussed with patient at bedside  disposition Plan: Home with at least 2 midnight stays  Consults called:  Admission status: inpatient  Level of care: Progressive  Status is: Inpatient  Remains inpatient appropriate because:Inpatient level of care appropriate due to severity of illness   Dispo: The  patient is from: Home              Anticipated d/c is to: Home              Patient currently is not medically stable to d/c.   Difficult to place patient No         Orene Desanctis DO [facility]   If 7PM-7AM, please contact night-coverage [email or web address]   04/07/2021, 12:16 AM

## 2021-04-07 NOTE — Progress Notes (Signed)
PROGRESS NOTE    Ralph Simpson  [id number] DOB: July 24, 1961 DOA: 04/06/2021 PCP: Pcp, No    Brief Narrative:  60 year old gentleman with history of migraine headache, avascular necrosis of the bilateral hips status post fibular grafting, ambulatory dysfunction, prediabetes and GERD presented to the ER with worsening headache and right upper extremity jerking.  Patient has longstanding history of migraines but no history of seizures.  At the emergency room he was afebrile.  Normotensive.  Unremarkable chemistry.  CT head was initially showing concern about acute subarachnoid hemorrhage in the high left frontal lobe.  CTA of the head and neck demonstrated left frontal hypodensity without evidence of vascular malformation or aneurysm.  MRI brain with leptomeningeal enhancement.  Started on Keppra, seen by neurology and admitted for neurological work-up.   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  Probably responsible for focal seizure.  Remains on Keppra 500 mg twice a day, will change to oral today. Multiple investigations in process Spinal tap and lumbar puncture, normal examination so far. DD includes neurosarcoid, rheumatoid meningitis, lupus cerebritis and other autoimmune meningitis or cerebritis Less likely stroke or metastatic disease.  Plan: Multiple serological test including lupus panel, rheumatoid factor, anti-SSA and SSB, serum and CSF ACE levels, viral studies, fungal culture pending. EEG, scheduled for today. CT scan of the chest abdomen pelvis essentially normal, with some irregular wall thickening of the herniated stomach, mostly consistent with history of GERD.  If no other diagnostic yield, will discuss with  gastroenterology for upper GI endoscopy to rule out malignancy.  Patient never had any screening colonoscopy, however no radiological evidence of any abnormal colon.  Focal seizures: As above.  Changed to oral Keppra.  Start mobilizing.  Patient has remained stable.  Discontinued telemetry monitor and continuous pulse ox to facilitate mobility.  All-time fall precautions and seizure precautions. Case discussed with neurology team.   DVT prophylaxis: enoxaparin (LOVENOX) injection 40 mg Start: 04/06/21 2330   Code Status: Full code Family Communication: Friend at the bedside Disposition Plan: Status is: Inpatient  Remains inpatient appropriate because:Inpatient level of care appropriate due to severity of illness   Dispo: The patient is from: Home              Anticipated d/c is to: Home              Patient currently is not medically stable to d/c.   Difficult to place patient No         Consultants:   Neurology  Procedures:   EEG, pending  Antimicrobials:   None   Subjective: Patient seen and examined.  His good friend was at the bedside.  Patient has some headache which is chronic otherwise denies any overnight events.  Denies any nausea or vomiting.  No more jerking movements of the right hand. Wants to walk around with his cane. Discussed in detail about findings on the scan and further testing planned.  Objective: Vitals:   04/07/21 0520 04/07/21 0703 04/07/21 0718 04/07/21 1220  BP: 100/64 111/67 100/68 101/69  Pulse: 69 63 60 68  Resp: 20 11 14 13   Temp: 97.7 F (36.5 C) 97.9 F (36.6 C) 98 F (36.7 C) 98 F (36.7 C)  TempSrc: Oral Oral Oral Oral  SpO2: 96% 94% 96% 94%    Intake/Output Summary (Last 24 hours) at 04/07/2021 1323 Last data filed at 04/06/2021 2356 Gross per 24 hour  Intake 1099 ml  Output --  Net 1099 ml   There were no vitals filed for this visit.  Examination:  General exam: Appears calm and comfortable  He was able to  walk around with his cane and go to the bathroom. Respiratory system: Clear to auscultation. Respiratory effort normal. Cardiovascular system: S1 & S2 heard, RRR. No JVD, murmurs, rubs, gallops or clicks. No pedal edema. Gastrointestinal system: Abdomen is nondistended, soft and nontender. No organomegaly or masses felt. Normal bowel sounds heard. Central nervous system: Alert and oriented. No focal neurological deficits. Extremities: Symmetric 5 x 5 power. Patient has some stiffness of both hip resulting in weakness of the legs, however no obvious neurological deficits on all extremities.    Data Reviewed: I have personally reviewed following labs and imaging studies  CBC: Recent Labs  Lab 04/06/21 1810 04/06/21 1829  WBC 4.7  --   NEUTROABS 2.6  --   HGB 15.1 14.6  HCT 43.9 43.0  MCV 85.2  --   PLT 218  --    Basic Metabolic Panel: Recent Labs  Lab 04/06/21 1810 04/06/21 1829 04/07/21 0435  NA 137 138 137  K 3.8 3.8 3.6  CL 104 102 106  CO2 26  --  25  GLUCOSE 102* 100* 124*  BUN 18 21 14   CREATININE 0.85 0.90 0.86  CALCIUM 9.3  --  8.7*   GFR: CrCl cannot be calculated (Unknown ideal weight.). Liver Function Tests: Recent Labs  Lab 04/06/21 1810  AST 20  ALT 22  ALKPHOS 61  BILITOT 0.6  PROT 7.2  ALBUMIN 4.2   No results for input(s): LIPASE, AMYLASE in the last 168 hours. No results for input(s): AMMONIA in the last 168 hours. Coagulation Profile: Recent Labs  Lab 04/06/21 1810  INR 1.0   Cardiac Enzymes: No results for input(s): CKTOTAL, CKMB, CKMBINDEX, TROPONINI in the last 168 hours. BNP (last 3 results) No results for input(s): PROBNP in the last 8760 hours. HbA1C: No results for input(s): HGBA1C in the last 72 hours. CBG: No results for input(s): GLUCAP in the last 168 hours. Lipid Profile: No results for input(s): CHOL, HDL, LDLCALC, TRIG, CHOLHDL, LDLDIRECT in the last 72 hours. Thyroid Function Tests: No results for input(s): TSH,  T4TOTAL, FREET4, T3FREE, THYROIDAB in the last 72 hours. Anemia Panel: No results for input(s): VITAMINB12, FOLATE, FERRITIN, TIBC, IRON, RETICCTPCT in the last 72 hours. Sepsis Labs: No results for input(s): PROCALCITON, LATICACIDVEN in the last 168 hours.  Recent Results (from the past 240 hour(s))  CSF culture w Gram Stain     Status: None (Preliminary result)   Collection Time: 04/06/21 11:11 PM   Specimen: CSF; Cerebrospinal Fluid  Result Value Ref Range Status   Specimen Description CSF  Final   Special Requests TUBE 2  Final   Gram Stain   Final    WBC PRESENT, PREDOMINANTLY MONONUCLEAR NO ORGANISMS SEEN CYTOSPIN SMEAR    Culture   Final    NO GROWTH < 12 HOURS Performed at [facility], [place] [address], [place], [place] [address]    Report Status PENDING  Incomplete  SARS CORONAVIRUS 2 (TAT 6-24 HRS) Nasopharyngeal Nasopharyngeal Swab     Status: None   Collection Time: 04/07/21  3:32 AM   Specimen: Nasopharyngeal Swab  Result Value Ref Range  Status   SARS Coronavirus 2 NEGATIVE NEGATIVE Final    Comment: (NOTE) SARS-CoV-2 target nucleic acids are NOT DETECTED.  The SARS-CoV-2 RNA is generally detectable in upper and lower respiratory specimens during the acute phase of infection. Negative results do not preclude SARS-CoV-2 infection, do not rule out co-infections with other pathogens, and should not be used as the sole basis for treatment or other patient management decisions. Negative results must be combined with clinical observations, patient history, and epidemiological information. The expected result is Negative.  Fact Sheet for Patients: SugarRoll.be  Fact Sheet for Healthcare Providers: https://www.woods-mathews.com/  This test is not yet approved or cleared by the [place] FDA and  has been authorized for detection and/or diagnosis of SARS-CoV-2 by FDA under an Emergency Use Authorization (EUA). This  EUA will remain  in effect (meaning this test can be used) for the duration of the COVID-19 declaration under Se ction 564(b)(1) of the Act, 21 U.S.C. section 360bbb-3(b)(1), unless the authorization is terminated or revoked sooner.  Performed at [facility], [place] [address] [place], [place] [address]   MRSA PCR Screening     Status: None   Collection Time: 04/07/21  5:27 AM   Specimen: Nasal Mucosa; Nasopharyngeal  Result Value Ref Range Status   MRSA by PCR NEGATIVE NEGATIVE Final    Comment:        The GeneXpert MRSA Assay (FDA approved for NASAL specimens only), is one component of a comprehensive MRSA colonization surveillance program. It is not intended to diagnose MRSA infection nor to guide or monitor treatment for MRSA infections. Performed at [facility], [place] [address] [place], [place] [address]          Radiology Studies: CT Angio Head W or Wo Contrast  Result Date: 04/06/2021 CLINICAL DATA:  Right arm spasms, headache, dizziness and speech changes. EXAM: CT ANGIOGRAPHY HEAD AND NECK TECHNIQUE: Multidetector CT imaging of the head and neck was performed using the standard protocol during bolus administration of intravenous contrast. Multiplanar CT image reconstructions and MIPs were obtained to evaluate the vascular anatomy. Carotid stenosis measurements (when applicable) are obtained utilizing NASCET criteria, using the distal internal carotid diameter as the denominator. CONTRAST:  96mL OMNIPAQUE IOHEXOL 350 MG/ML SOLN COMPARISON:  None. FINDINGS: CTA NECK FINDINGS SKELETON: There is no bony spinal canal stenosis. No lytic or blastic lesion. OTHER NECK: Normal pharynx, larynx and major salivary glands. No cervical lymphadenopathy. Unremarkable thyroid gland. UPPER CHEST: No pneumothorax or pleural effusion. No nodules or masses. AORTIC ARCH: There is no calcific atherosclerosis of the aortic arch. There is no aneurysm, dissection or hemodynamically  significant stenosis of the visualized portion of the aorta. Conventional 3 vessel aortic branching pattern. The visualized proximal subclavian arteries are widely patent. RIGHT CAROTID SYSTEM: Normal without aneurysm, dissection or stenosis. LEFT CAROTID SYSTEM: Normal without aneurysm, dissection or stenosis. VERTEBRAL ARTERIES: Left dominant configuration. Both origins are clearly patent. There is no dissection, occlusion or flow-limiting stenosis to the skull base (V1-V3 segments). CTA HEAD FINDINGS POSTERIOR CIRCULATION: --Vertebral arteries: Normal V4 segments. --Inferior cerebellar arteries: Normal. --Basilar artery: Normal. --Superior cerebellar arteries: Normal. --Posterior cerebral arteries (PCA): Normal. ANTERIOR CIRCULATION: --Intracranial internal carotid arteries: Normal. --Anterior cerebral arteries (ACA): Normal. Both A1 segments are present. Patent anterior communicating artery (a-comm). --Middle cerebral arteries (MCA): Normal. VENOUS SINUSES: As permitted by contrast timing, patent. ANATOMIC VARIANTS: None Review of the MIP images confirms the above findings. IMPRESSION: Normal CTA of the head and neck.  Electronically Signed   By: Ulyses Jarred M.D.   On: 04/06/2021 19:28   CT HEAD WO CONTRAST  Result Date: 04/06/2021 CLINICAL DATA:  Dizziness, memory loss.  History of migraines EXAM: CT HEAD WITHOUT CONTRAST TECHNIQUE: Contiguous axial images were obtained from the base of the skull through the vertex without intravenous contrast. COMPARISON:  None. FINDINGS: Brain: Increased density material within the subarachnoid spaces of the high left frontal lobe extending over approximately a 5.3 cm area (series 3, images 26-29). No definite underlying mass lesion. No edema. No hydrocephalus. No large territory acute infarction. Vascular: No hyperdense vessel or unexpected calcification. Skull: Normal. Negative for fracture or focal lesion. Sinuses/Orbits: No acute finding. Other: None. IMPRESSION:  Findings suspicious for acute subarachnoid hemorrhage in the high left frontal lobe. No definite underlying mass lesion. Further evaluation with MRI of the brain is recommended. Critical Value/emergent results were called by telephone at the time of interpretation on 04/06/2021 at 6:13 pm to provider Dr Darl Householder, who verbally acknowledged these results. Electronically Signed   By: Davina Poke D.O.   On: 04/06/2021 18:14   CT Angio Neck W and/or Wo Contrast  Result Date: 04/06/2021 CLINICAL DATA:  Right arm spasms, headache, dizziness and speech changes. EXAM: CT ANGIOGRAPHY HEAD AND NECK TECHNIQUE: Multidetector CT imaging of the head and neck was performed using the standard protocol during bolus administration of intravenous contrast. Multiplanar CT image reconstructions and MIPs were obtained to evaluate the vascular anatomy. Carotid stenosis measurements (when applicable) are obtained utilizing NASCET criteria, using the distal internal carotid diameter as the denominator. CONTRAST:  53mL OMNIPAQUE IOHEXOL 350 MG/ML SOLN COMPARISON:  None. FINDINGS: CTA NECK FINDINGS SKELETON: There is no bony spinal canal stenosis. No lytic or blastic lesion. OTHER NECK: Normal pharynx, larynx and major salivary glands. No cervical lymphadenopathy. Unremarkable thyroid gland. UPPER CHEST: No pneumothorax or pleural effusion. No nodules or masses. AORTIC ARCH: There is no calcific atherosclerosis of the aortic arch. There is no aneurysm, dissection or hemodynamically significant stenosis of the visualized portion of the aorta. Conventional 3 vessel aortic branching pattern. The visualized proximal subclavian arteries are widely patent. RIGHT CAROTID SYSTEM: Normal without aneurysm, dissection or stenosis. LEFT CAROTID SYSTEM: Normal without aneurysm, dissection or stenosis. VERTEBRAL ARTERIES: Left dominant configuration. Both origins are clearly patent. There is no dissection, occlusion or flow-limiting stenosis to the skull  base (V1-V3 segments). CTA HEAD FINDINGS POSTERIOR CIRCULATION: --Vertebral arteries: Normal V4 segments. --Inferior cerebellar arteries: Normal. --Basilar artery: Normal. --Superior cerebellar arteries: Normal. --Posterior cerebral arteries (PCA): Normal. ANTERIOR CIRCULATION: --Intracranial internal carotid arteries: Normal. --Anterior cerebral arteries (ACA): Normal. Both A1 segments are present. Patent anterior communicating artery (a-comm). --Middle cerebral arteries (MCA): Normal. VENOUS SINUSES: As permitted by contrast timing, patent. ANATOMIC VARIANTS: None Review of the MIP images confirms the above findings. IMPRESSION: Normal CTA of the head and neck. Electronically Signed   By: Ulyses Jarred M.D.   On: 04/06/2021 19:28   MR Brain W and Wo Contrast  Result Date: 04/06/2021 CLINICAL DATA:  Transient ischemic attack.  Intracranial hemorrhage EXAM: MRI HEAD WITHOUT AND WITH CONTRAST TECHNIQUE: Multiplanar, multiecho pulse sequences of the brain and surrounding structures were obtained without and with intravenous contrast. CONTRAST:  8.11mL GADAVIST GADOBUTROL 1 MMOL/ML IV SOLN COMPARISON:  None. FINDINGS: Brain: There is no acute infarct. There is hyperintensity on the FLAIR sequence at the anterior left frontal lobe and anterior right parietal lobe. These areas show mild contrast enhancement, particularly the left frontal areas. No  midline shift or other mass effect. There is multifocal periventricular white matter hyperintensity, most often a result of chronic microvascular ischemia. Vascular: Major flow voids are preserved. Skull and upper cervical spine: Normal calvarium and skull base. Visualized upper cervical spine and soft tissues are normal. Sinuses/Orbits:No paranasal sinus fluid levels or advanced mucosal thickening. No mastoid or middle ear effusion. Normal orbits. IMPRESSION: 1. Areas of subarachnoid/pial contrast enhancement over both convexities, left more than right. Reactive enhancement  in the setting of subarachnoid hemorrhage is favored. Other possibilities include meningitis and subacute ischemia. 2. Findings of chronic microvascular ischemia. Electronically Signed   By: Ulyses Jarred M.D.   On: 04/06/2021 21:11   CT CHEST ABDOMEN PELVIS WO CONTRAST  Result Date: 04/07/2021 CLINICAL DATA:  Cancer of unknown primary, staging Brain lesion. EXAM: CT CHEST, ABDOMEN AND PELVIS WITHOUT CONTRAST TECHNIQUE: Multidetector CT imaging of the chest, abdomen and pelvis was performed following the standard protocol without IV contrast. COMPARISON:  Remote abdominopelvic CT 03/05/2006 FINDINGS: CT CHEST FINDINGS Cardiovascular: Mild aortic atherosclerosis without aneurysm. Normal heart size. No pericardial effusion. Mediastinum/Nodes: Moderate-sized hiatal hernia with irregular wall thickening of the herniated stomach. No proximal esophageal dilatation. No visualized thyroid nodule. No enlarged mediastinal lymph nodes. Limited hilar assessment on this noncontrast exam. Lungs/Pleura: Mild biapical pleuroparenchymal scarring. No acute or focal airspace disease. Occasional areas of fissural thickening of the left inter lobar fissure without discrete pulmonary nodule. There is no pulmonary mass. Trachea and central bronchi are patent. No pleural fluid. Musculoskeletal: No focal bone lesion or acute osseous abnormality. No chest wall soft tissue abnormality. CT ABDOMEN PELVIS FINDINGS Hepatobiliary: Assessment for liver lesions is limited on this noncontrast exam. Allowing for this, no focal hepatic abnormality is seen. Small gallstone within the gallbladder without pericholecystic inflammation or biliary dilatation. Pancreas: No ductal dilatation or inflammation. No evidence of pancreatic mass on this noncontrast exam. Spleen: Normal in size without focal abnormality. Adrenals/Urinary Tract: Normal adrenal glands. There is excreted IV contrast in both renal collecting systems from prior head and neck CTA. 3.8  cm simple cyst in the upper left kidney. 2.8 cm simple cyst in the anterior mid left kidney. No evidence of solid renal lesion. Excreted IV contrast in the urinary bladder, no bladder wall thickening. Stomach/Bowel: Lack of enteric contrast limits detailed assessment. Small to moderate hiatal hernia with wall thickening of the herniated stomach. Ingested material within the stomach. There is a duodenal diverticulum without acute inflammation. No small bowel obstruction or inflammation. No obvious small bowel lesion. Normal appendix. Multifocal colonic diverticulosis. No evidence of diverticulitis. Moderate colonic stool burden. No obvious colonic mass on this unenhanced exam. There is mild mural hypertrophy of the sigmoid. Vascular/Lymphatic: Aortic and branch atherosclerosis. No aortic aneurysm. Small periportal and retroperitoneal lymph nodes are not enlarged by size criteria. Reproductive: Enlarged prostate gland spanning 5.8 cm. Other: No ascites. No free air. No evidence of omental disease. Tiny fat containing umbilical hernia. No subcutaneous soft tissue abnormality. Musculoskeletal: Postsurgical change in both proximal femoral with ghost tracks from decompression for femoral head avascular necrosis. K-wires remain in place. No focal bone lesion. IMPRESSION: 1. Moderate-sized hiatal hernia with irregular wall thickening of the herniated stomach. Endoscopy recommended to exclude neoplasm. 2. No other evidence of primary malignancy or metastatic disease in the chest, abdomen, or pelvis on this noncontrast exam. 3. Colonic diverticulosis without diverticulitis. 4. Enlarged prostate gland. 5. Cholelithiasis without gallbladder inflammation. Aortic Atherosclerosis (ICD10-I70.0). Electronically Signed   By: Aurther Loft.D.  On: 04/07/2021 01:55        Scheduled Meds: . enoxaparin (LOVENOX) injection  40 mg Subcutaneous QHS  . levETIRAcetam  500 mg Oral BID  . sodium chloride flush  3 mL Intravenous  Once   Continuous Infusions:    LOS: 1 day    Time spent: 35 minutes    Barb Merino, MD [facility] Pager [phone]

## 2021-04-07 NOTE — Progress Notes (Signed)
Received patient from ED, a/o x4 and ambulatory w/cane. Vital signs WDL. CHG bath given and skin assessed and intact. Belongings bag w/ pair of shoes, tshirt, and jeans placed in room closet, pt's own cane at bedside. Oriented to room, bed controls, and call light/remote. Call light within reached, bed alarm turned on.

## 2021-04-07 NOTE — Progress Notes (Signed)
Neurology Progress Note   S:// Seen and examined Reports of ongoing headache.  No other complaints.  O:// Current vital signs: BP 100/68 (BP Location: Right Arm)   Pulse 60   Temp 98 F (36.7 C) (Oral)   Resp 14   SpO2 96%  Vital signs in last 24 hours: Temp:  [97.7 F (36.5 C)-98.5 F (36.9 C)] 98 F (36.7 C) (05/22 0718) Pulse Rate:  [60-84] 60 (05/22 0718) Resp:  [11-20] 14 (05/22 0718) BP: (94-136)/(55-89) 100/68 (05/22 0718) SpO2:  [93 %-98 %] 96 % (05/22 0718) General: Awake alert in no distress HEENT normocephalic atraumatic CVS regular rate rhythm Respiratory: Breathing well saturating normally on room air Abdomen nondistended nontender Extremities no edema Neurological Awake alert oriented x3 No aphasia No neglect No dysarthria Cranial nerves: 2-12 intact Motor exam: Bilateral upper extremities 5/5.  Bilateral lower extremities bilateral exam with 4/5 strength at the hip due to prior surgeries. Sensory exam: Intact to light touch all over without extinction DTRs symmetric   Medications  Current Facility-Administered Medications:  .  enoxaparin (LOVENOX) injection 40 mg, 40 mg, Subcutaneous, QHS, Tu, Ching T, DO, 40 mg at 04/07/21 0004 .  levETIRAcetam (KEPPRA) IVPB 500 mg/100 mL premix, 500 mg, Intravenous, Q12H, Bhagat, Srishti L, MD, Stopped at 04/06/21 2356 .  prochlorperazine (COMPAZINE) injection 10 mg, 10 mg, Intravenous, Q6H PRN, Tu, Ching T, DO .  sodium chloride flush (NS) 0.9 % injection 3 mL, 3 mL, Intravenous, Once, Drenda Freeze, MD Labs CBC    Component Value Date/Time   WBC 4.7 04/06/2021 1810   RBC 5.15 04/06/2021 1810   HGB 14.6 04/06/2021 1829   HCT 43.0 04/06/2021 1829   PLT 218 04/06/2021 1810   MCV 85.2 04/06/2021 1810   MCH 29.3 04/06/2021 1810   MCHC 34.4 04/06/2021 1810   RDW 13.2 04/06/2021 1810   LYMPHSABS 1.5 04/06/2021 1810   MONOABS 0.5 04/06/2021 1810   EOSABS 0.1 04/06/2021 1810   BASOSABS 0.0 04/06/2021 1810     CMP     Component Value Date/Time   NA 137 04/07/2021 0435   K 3.6 04/07/2021 0435   CL 106 04/07/2021 0435   CO2 25 04/07/2021 0435   GLUCOSE 124 (H) 04/07/2021 0435   BUN 14 04/07/2021 0435   CREATININE 0.86 04/07/2021 0435   CALCIUM 8.7 (L) 04/07/2021 0435   PROT 7.2 04/06/2021 1810   ALBUMIN 4.2 04/06/2021 1810   AST 20 04/06/2021 1810   ALT 22 04/06/2021 1810   ALKPHOS 61 04/06/2021 1810   BILITOT 0.6 04/06/2021 1810   GFRNONAA >60 04/07/2021 0435   GFRAA >60 11/11/2015 1340   CSF shows tube 1 with 200 red cells and tube 02/1999 red cells, 2 white cells,  protein of 51 and normal glucose.   Imaging I have reviewed images in epic and the results pertinent to this consultation are: CT head with subtle hyper density in the high left frontal lobe-question subarachnoid hemorrhage versus inflammatory/infectious process. CTA head and neck with minimal subtle enhancement of the abnormal area seen on CT head.  No evidence of underlying vascular malformation.  No aneurysm. MRI brain with left frontal leptomeningeal enhancement and subtle right convexity leptomeningeal enhancement.  There is also asymmetric FLAIR hyper intensities on the left hemisphere in comparison to the right-unclear clinical significance.  CT of the chest abdomen pelvis done as a part of malignancy rule out work-up revealed a moderate size hiatal hernia with irregular wall thickening of the herniated stomach  with recommendations to follow-up with endoscopy to exclude neoplasm.  No other evidence of primary malignancy or metastatic disease in the chest abdomen pelvis on the noncontrasted exam.  Assessment:  60 year old presenting with complaints of headache for which he was evaluated with a CT head with concern for a subtle hyper density in the high left frontal lobe concerning for a subarachnoid hemorrhage versus infectious/inflammatory process. Vessel imaging unremarkable for any underlying vascular  malformation.  MRI of the brain shows leptomeningeal enhancement in the left frontal lobe as well as right cerebral convexity minimally. CSF studies did reveal increasing red cell count in tube 4 in comparison to right 1 leaving a subarachnoid bleed and reactive inflammation as suggested by radiology to be the differential but there are multiple other differentials to be considered here.  Neurosarcoidosis, rheumatoid meningitis (has history of arthritis of the joints and avascular necrosis of the hip), lupus cerebritis- essentially autoimmune meningitis/cerebritis are also likely given the appearance. This is less likely to be a stroke with hemorrhagic transformation Given the convexity subarachnoid's, R CVS (reversible cerebral vasoconstriction syndrome) also remains in the picture  The broad differentials in this case include: Subarachnoid hemorrhage from RCVS, vasculitis, autoimmune meningitis/cerebritis including neurosarcoid/rheumatoid/lupus, intravascular lymphoma, metastatic disease.  Further work-up underway to narrow the etiology.  Of note, also reports of right arm stiffening and jerking very suspicious for focal seizure for which she has been started on Keppra  Impression: Leptomeningeal enhancement with a broad differential as above Focal seizure likely emanating from the left frontal area of abnormal enhancement    Recommendations:  I would recommend a diagnostic cerebral angiogram tomorrow to evaluate for vasculitis versus underlying vascular malformation that might have been missed on CT angiogram  Check lupus panel, rheumatoid factor, anti-SSA, and SSB.  Check serum and CSF angiotensin-converting enzyme  CSF has cytology and flow cytometry pending-will probably be done Monday.  Consider EGD given recommendations of radiology from the CT of the chest abdomen pelvis for the irregular wall thickening of the herniated stomach to look for a primary, which would support his  current imaging abnormalities to be metastatic in nature  CSF VZV, HSV, fungal cultures, IgG index, will from bands pending  Continue Keppra 500 twice daily  Routine EEG  Seizure precautions  Discussed my plan in detail with the patient and his family at bedside. Discussed my plan in person with Dr. Sloan Leiter  Neurology will follow.  -- Amie Portland, MD Neurologist [facility] Pager: [phone]

## 2021-04-08 ENCOUNTER — Inpatient Hospital Stay (HOSPITAL_COMMUNITY): Payer: Medicare Other

## 2021-04-08 DIAGNOSIS — K449 Diaphragmatic hernia without obstruction or gangrene: Secondary | ICD-10-CM | POA: Diagnosis not present

## 2021-04-08 DIAGNOSIS — R519 Headache, unspecified: Secondary | ICD-10-CM | POA: Diagnosis not present

## 2021-04-08 DIAGNOSIS — R933 Abnormal findings on diagnostic imaging of other parts of digestive tract: Secondary | ICD-10-CM | POA: Diagnosis not present

## 2021-04-08 DIAGNOSIS — R9089 Other abnormal findings on diagnostic imaging of central nervous system: Secondary | ICD-10-CM | POA: Diagnosis not present

## 2021-04-08 HISTORY — PX: IR ANGIO INTRA EXTRACRAN SEL COM CAROTID INNOMINATE BILAT MOD SED: IMG5360

## 2021-04-08 HISTORY — PX: IR ANGIO VERTEBRAL SEL SUBCLAVIAN INNOMINATE BILAT MOD SED: IMG5366

## 2021-04-08 HISTORY — PX: IR US GUIDE VASC ACCESS RIGHT: IMG2390

## 2021-04-08 LAB — VDRL, CSF: VDRL Quant, CSF: NONREACTIVE

## 2021-04-08 LAB — GLUCOSE, CAPILLARY: Glucose-Capillary: 126 mg/dL — ABNORMAL HIGH (ref 70–99)

## 2021-04-08 LAB — MISC LABCORP TEST (SEND OUT): Labcorp test code: 9985

## 2021-04-08 IMAGING — XA IR ANGIO INTRA EXTRACRAN SEL COM CAROTID INNOMINATE BILAT MOD SE
2 series · 13 of 24 positions shown · IV contrast (IODINE)
Comparison: MRI of the brain and CT angiogram of the brain [DATE].

CLINICAL DATA: Severe headaches. Abnormal cortical and subarachnoid
enhancement in the left anterior frontal and anterior parietal
regions on MRI of the brain and CT of the brain.

EXAM:
BILATERAL COMMON CAROTID AND INNOMINATE ANGIOGRAPHY
TECHNIQUE: Informed written consent was obtained from the patient after a
thorough discussion of the procedural risks, benefits and
alternatives. All questions were addressed. Maximal Sterile Barrier
Technique was utilized including caps, mask, sterile gowns, sterile
gloves, sterile drape, hand hygiene and skin antiseptic. A timeout
was performed prior to the initiation of the procedure.

[Series 1: ir (id) (id) · 1 of 1 slices shown]
[im 1/1]
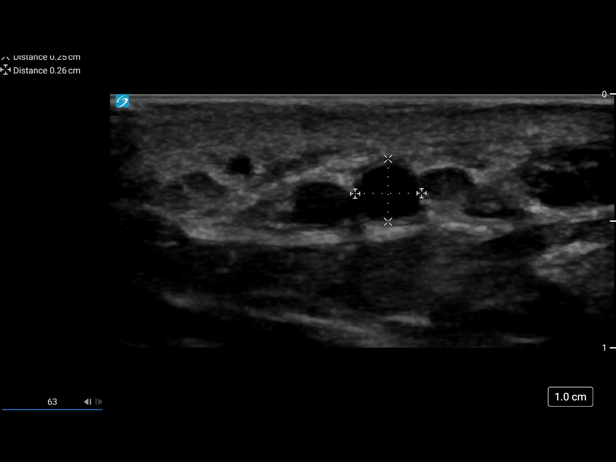

[Series 300: dr. (person_name) · 12 of 215 slices shown]
[im 10/215]
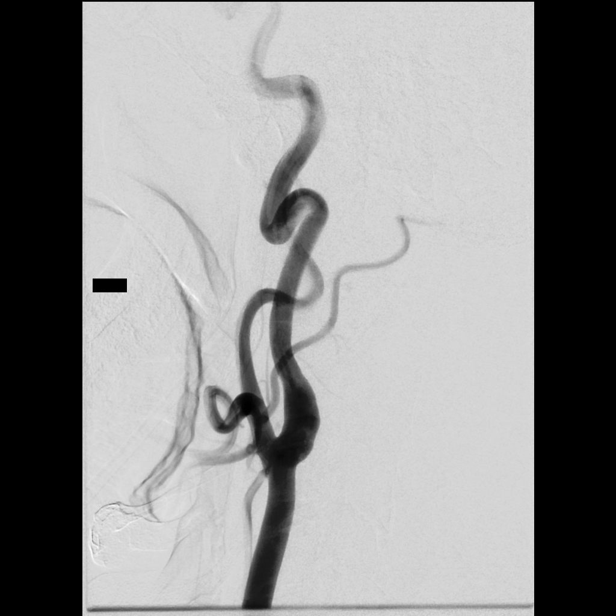
[im 30/215]
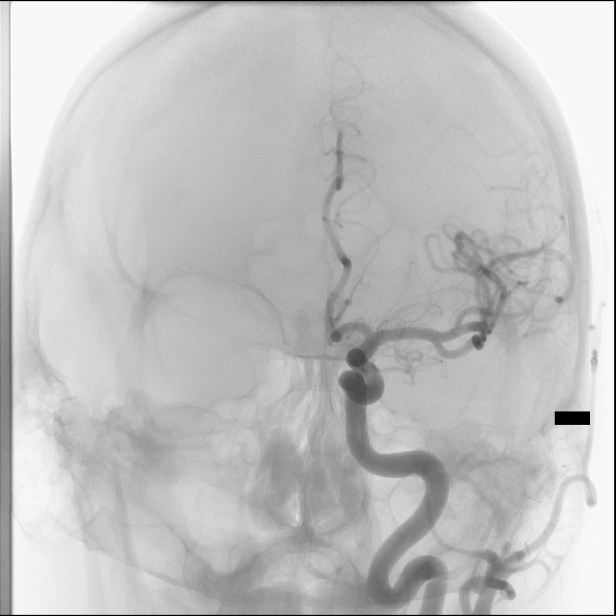
[im 49/215]
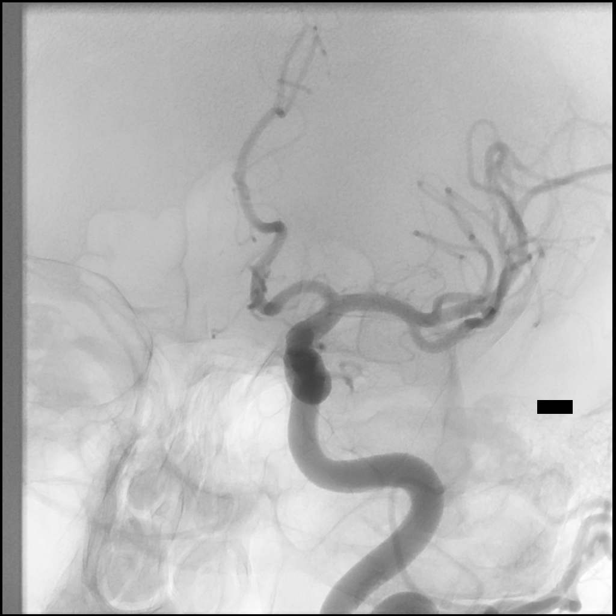
[im 69/215]
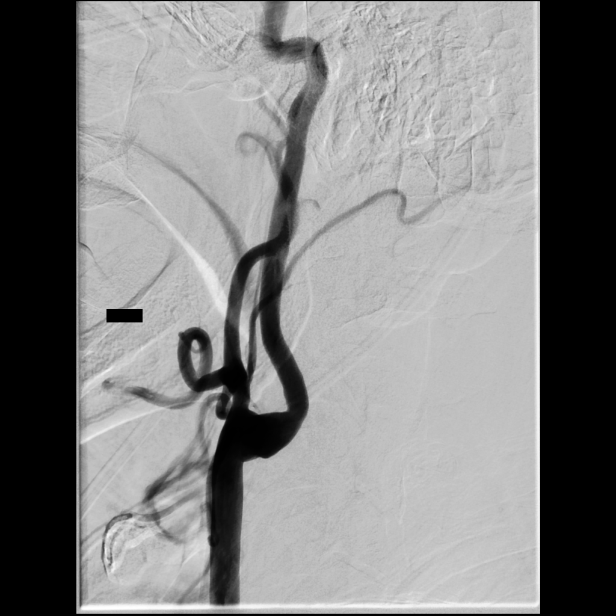
[im 88/215]
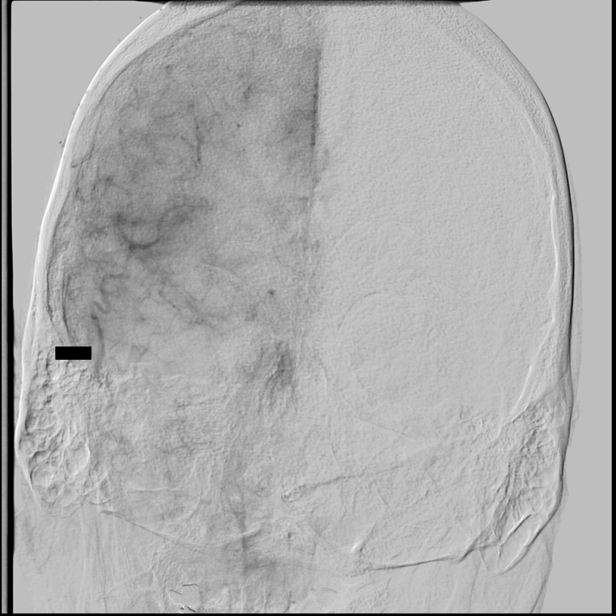
[im 108/215]
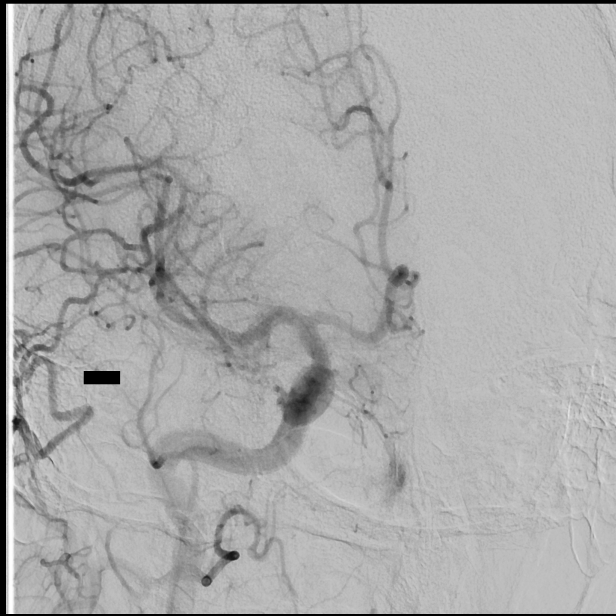
[im 117/215]
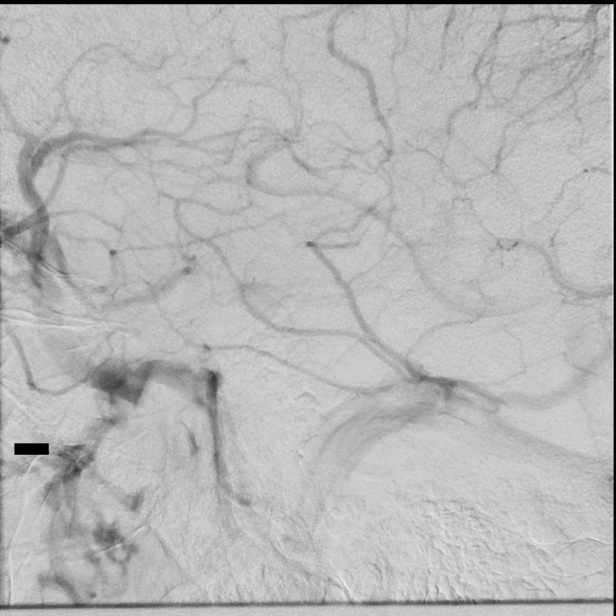
[im 137/215]
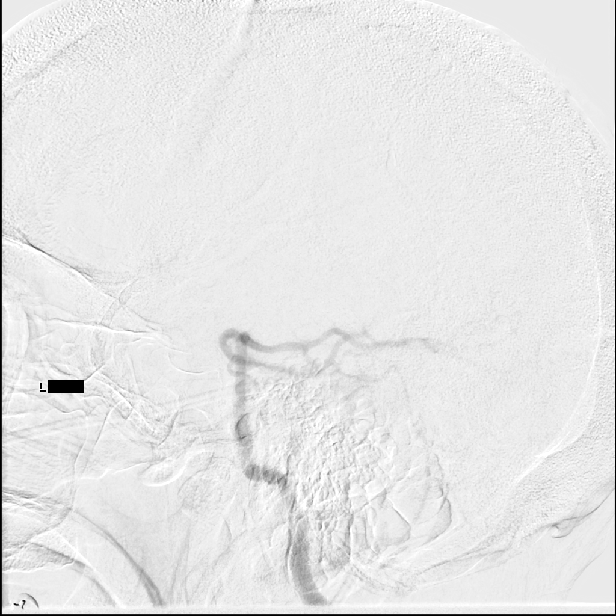
[im 156/215]
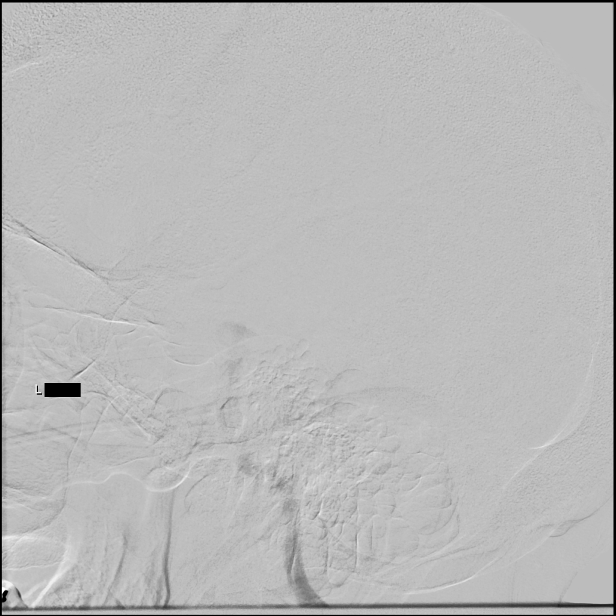
[im 176/215]
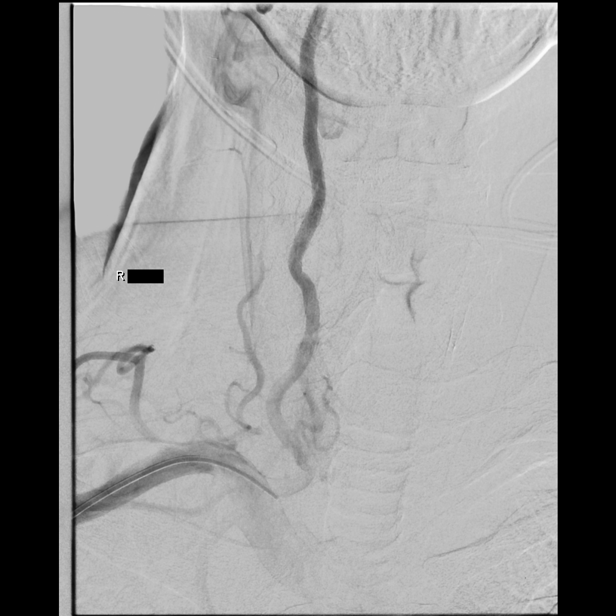
[im 195/215]
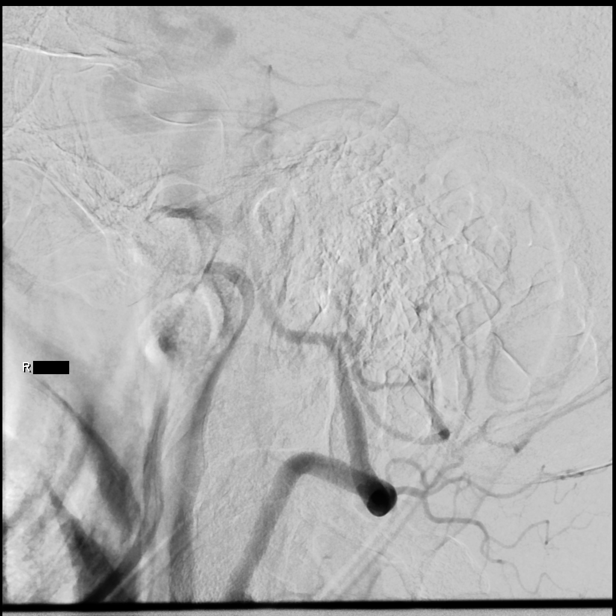
[im 215/215]
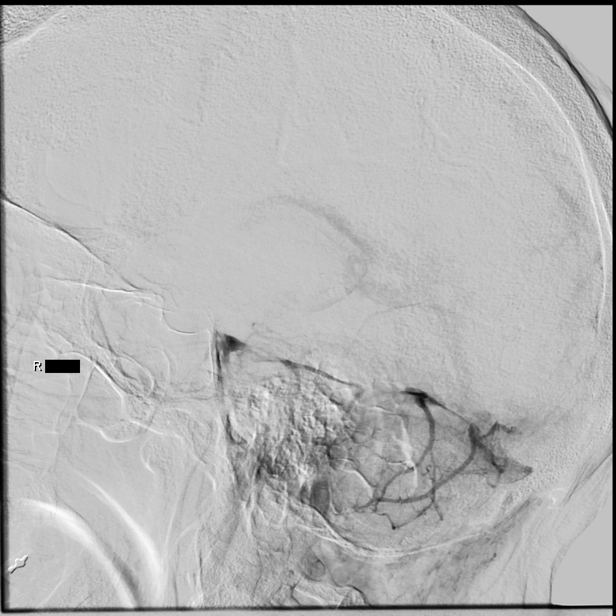

[13 of 24 positions shown; findings below may reference images not displayed]

MEDICATIONS:
Heparin [9C] units IV. No antibiotic was administered within 1 hour
of the procedure.

ANESTHESIA/SEDATION:
Versed 1 mg IV; Fentanyl 25 mcg IV

Moderate Sedation Time:  42 minutes

The patient was continuously monitored during the procedure by the
interventional radiology nurse under my direct supervision.

CONTRAST:  Isovue 300 approximately 100 mL.

FLUOROSCOPY TIME:  Fluoroscopy Time: 11 minutes 48 seconds ([9C]
mGy).

COMPLICATIONS:
None immediate.
The right forearm to the wrist was prepped and draped in the usual
sterile manner. The right radial artery was then identified with
ultrasound and its morphology documented.

A dorsal palmar anastomosis was verified to be present. Using
ultrasound guidance radial access was obtained of the right radial
artery over an 018 inch micro guidewire.

A [DATE] French radial sheath was then inserted without event. The
micro guidewire, and the obturator were removed. Good aspiration
obtained from the side port of the radial sheath. A cocktail of [9C]
units of heparin, 2.5 mg of verapamil, and 200 mcg of nitroglycerin
was then infused in diluted form through the radial sheath without
event.

A right radial arteriogram was performed. Over a 0.035 inch
Roadrunner guidewire, COAL 2 diagnostic catheter was then
advanced to the aortic arch region, and cannulation was performed of
the right vertebral artery, the right common carotid artery, the
left common carotid artery and the left vertebral artery. Following
the procedure, hemostasis at the right radial puncture site was
achieved with a wrist band. Distal right radial pulse was verified
to be present.
FINDINGS: The left common carotid arteriogram demonstrates the left external
carotid artery and its major branches to be widely patent. The right
internal carotid artery at the bulb to the cranial skull base is
widely patent with moderate tortuosity at the junction of the distal
[DATE] and the middle [DATE].

The petrous, cavernous and supraclinoid segments demonstrate wide
patency.

The left middle cerebral artery and the left anterior cerebral
artery opacify into the capillary and venous phases.

The right common carotid arteriogram demonstrates the right external
carotid artery and its major branches to be widely patent.

The right internal carotid artery at the bulb to the cranial skull
base is widely patent.

The petrous, the cavernous and the supraclinoid segments are widely
patent as well.

The right middle cerebral artery and the right anterior cerebral
artery opacify into the capillary and venous phases.

The left vertebral artery origin is widely patent. The vessel is
seen to opacify to the cranial skull base. Wide patency is seen of
the left vertebrobasilar junction and the left posterior-inferior
cerebellar artery.

The basilar artery, the posterior cerebral arteries, the superior
cerebellar arteries and the anterior-inferior cerebellar arteries
are opacified and demonstrate patency into the capillary and venous
phases.

The right vertebral artery origin is widely patent.

The vessel is seen to opacify normally to the cranial skull base.

More distally wide patency is seen of the right vertebrobasilar
junction and the right posterior-inferior cerebellar artery.

The opacified portion of the basilar artery, the posterior cerebral
arteries, the superior cerebellar arteries and the anterior-inferior
cerebellar arteries demonstrate patency into the capillary and
venous phases.
IMPRESSION: Angiographically no evidence of arteriovenous malformation, dural AV
fistula, aneurysms, dissections, or of abnormal filling defects.

Venous outflow grossly intact. Predominant outflow of both cerebral
hemispheres is via the right transverse sinus and the right sigmoid
sinus.

PLAN:
Angiographic findings reviewed with the patient and the referring
neurologist.

## 2021-04-08 MED ORDER — SODIUM CHLORIDE 0.9 % IV SOLN
INTRAVENOUS | Status: AC | PRN
  Administered 2021-04-08: 250 mL via INTRAVENOUS
  Administered 2021-04-08: 75 mL/h via INTRAVENOUS

## 2021-04-08 MED ORDER — MIDAZOLAM HCL 2 MG/2ML IJ SOLN
INTRAMUSCULAR | Status: AC
  Filled 2021-04-08: qty 2

## 2021-04-08 MED ORDER — DIVALPROEX SODIUM 250 MG PO DR TAB
500.0000 mg | DELAYED_RELEASE_TABLET | Freq: Two times a day (BID) | ORAL | Status: DC
  Administered 2021-04-09: 500 mg via ORAL
  Filled 2021-04-08: qty 2

## 2021-04-08 MED ORDER — SODIUM CHLORIDE 0.9 % IV SOLN: INTRAVENOUS | Status: AC

## 2021-04-08 MED ORDER — VERAPAMIL HCL 2.5 MG/ML IV SOLN
INTRAVENOUS | Status: AC
  Filled 2021-04-08: qty 2

## 2021-04-08 MED ORDER — IOHEXOL 300 MG/ML  SOLN: 100.0000 mL | Freq: Once | INTRAMUSCULAR | Status: DC | PRN

## 2021-04-08 MED ORDER — DIVALPROEX SODIUM 250 MG PO DR TAB
1000.0000 mg | DELAYED_RELEASE_TABLET | Freq: Once | ORAL | Status: AC
  Administered 2021-04-08: 1000 mg via ORAL
  Filled 2021-04-08: qty 4

## 2021-04-08 MED ORDER — FENTANYL CITRATE (PF) 100 MCG/2ML IJ SOLN
INTRAMUSCULAR | Status: AC
  Filled 2021-04-08: qty 2

## 2021-04-08 MED ORDER — MIDAZOLAM HCL 2 MG/2ML IJ SOLN
INTRAMUSCULAR | Status: AC | PRN
  Administered 2021-04-08: 1 mg via INTRAVENOUS

## 2021-04-08 MED ORDER — HEPARIN SODIUM (PORCINE) 1000 UNIT/ML IJ SOLN
INTRAMUSCULAR | Status: AC | PRN
  Administered 2021-04-08: 2000 [IU] via INTRA_ARTERIAL

## 2021-04-08 MED ORDER — SODIUM CHLORIDE (PF) 0.9 % IJ SOLN
INTRAVENOUS | Status: AC | PRN
  Administered 2021-04-08 (×2): 200 ug via INTRA_ARTERIAL

## 2021-04-08 MED ORDER — HEPARIN SODIUM (PORCINE) 1000 UNIT/ML IJ SOLN
INTRAMUSCULAR | Status: AC
  Filled 2021-04-08: qty 1

## 2021-04-08 MED ORDER — NITROGLYCERIN 1 MG/10 ML FOR IR/CATH LAB
INTRA_ARTERIAL | Status: AC
  Filled 2021-04-08: qty 10

## 2021-04-08 MED ORDER — VERAPAMIL HCL 2.5 MG/ML IV SOLN
INTRAVENOUS | Status: AC | PRN
  Administered 2021-04-08: 2.5 mg via INTRAVENOUS

## 2021-04-08 MED ORDER — FENTANYL CITRATE (PF) 100 MCG/2ML IJ SOLN
INTRAMUSCULAR | Status: AC | PRN
  Administered 2021-04-08: 25 ug via INTRAVENOUS

## 2021-04-08 MED ORDER — LIDOCAINE HCL (PF) 1 % IJ SOLN
INTRAMUSCULAR | Status: AC
  Filled 2021-04-08: qty 30

## 2021-04-08 NOTE — Progress Notes (Signed)
Pt admitted to 3W AxOx4, VS wnL and as per flow. Pt oriented to 3W processes.(R) radial band C/D/I, no adverse effects. Sequence vitals started. Pulses palpable. All questions and concerns addressed. Call bell placed within reach, will continue to monitor and maintain safety.

## 2021-04-08 NOTE — Consult Note (Signed)
Chief Complaint: Patient was seen in consultation today for headache/diagnostic cerebral arteriogram.  Referring Physician(s): Greta Doom (neurology)  Supervising Physician: Luanne Bras  Patient Status: [facility] - In-pt  History of Present Illness: Ralph Simpson is a 60 y.o. male with a past medical history of migraines, GERD, pre-diabetes, and avascular necrosis of bilateral hips s/p free vascularized fibular grafting in 1999. He presented to [facility] ED secondary to worsening headache and RUE "jerking". In ED, imaging concerning for [facility] (leptomeningeal enhancement). He was admitted for further management. LP preformed in ED. He was started on Keppra as EEG concerning for focal seizures. Neurology was consulted (currently broad differential diagnosis including subarachnoid hemorrhage from RCVS, vasculitis, autoimmune meningitis/cerebritis including neurosarcoid/rheumatoid/lupus, intravascular lymphoma, metastatic disease) who recommended NIR consult for possible diagnostic cerebral arteriogram for further evaluation.  MR brain 04/06/2021: 1. Areas of subarachnoid/pial contrast enhancement over both convexities, left more than right. Reactive enhancement in the setting of subarachnoid hemorrhage is favored. Other possibilities include meningitis and subacute ischemia. 2. Findings of chronic microvascular ischemia.  CT head 04/06/2021: 1. Findings suspicious for acute subarachnoid hemorrhage in the high left frontal lobe. No definite underlying mass lesion. Further evaluation with MRI of the brain is recommended.  NIR consulted by Dr. Leonel Ramsay for possible image-guided diagnostic cerebral arteriogram. Patient awake and alert standing in bathroom brushing teeth with no complaints at this time. States headache and RUE jerking from admission have subsided at this time. Denies fever, chills, chest pain, dyspnea, abdominal pain, or headache.  LD SQ Lovenox 04/07/2021 at  2133.   Past Medical History:  Diagnosis Date  . Allergy   . GERD (gastroesophageal reflux disease)   . Migraine     Past Surgical History:  Procedure Laterality Date  . HERNIA REPAIR    . HIP SURGERY      Allergies: Patient has no known allergies.  Medications: Prior to Admission medications   Medication Sig Start Date End Date Taking? Authorizing Provider  aspirin-acetaminophen-caffeine (EXCEDRIN MIGRAINE) [phone] MG tablet Take 2 tablets by mouth every 6 (six) hours as needed for headache or migraine.   Yes [provider]  glucose blood (ACCU-CHEK AVIVA PLUS) test strip BLOOD SUGAR ONCE DAILY OR AS DIRECTED 02/08/21  Yes Baity, Coralie Keens, NP  Multiple Vitamins-Minerals (PRESERVISION AREDS PO) Take 1 tablet by mouth in the morning and at bedtime.   Yes [provider]  OVER THE COUNTER MEDICATION Take 1 capsule by mouth with breakfast, with lunch, and with evening meal. Reishi Mushroom   Yes [provider]  OVER THE COUNTER MEDICATION Take 2 capsules by mouth with breakfast, with lunch, and with evening meal. Natural Glucocil- total blood sugar   Yes [provider]  OVER THE COUNTER MEDICATION Take 1 tablet by mouth daily. bosweilla   Yes [provider]     Family History  Problem Relation Age of Onset  . Arthritis Mother   . Stroke Mother   . Diabetes Father   . Heart disease Father   . Hyperlipidemia Father   . Hypertension Father   . Kidney disease Maternal Grandfather     Social History   Socioeconomic History  . Marital status: Single    Spouse name: Not on file  . Number of children: Not on file  . Years of education: Not on file  . Highest education level: Not on file  Occupational History  . Not on file  Tobacco Use  . Smoking status: Never Smoker  .  Smokeless tobacco: Never Used  Substance and Sexual Activity  . Alcohol use: Yes    Comment: occasional  . Drug use: No  . Sexual activity: Not on file   Other Topics Concern  . Not on file  Social History Narrative  . Not on file   Social Determinants of Health   Financial Resource Strain: Not on file  Food Insecurity: Not on file  Transportation Needs: Not on file  Physical Activity: Not on file  Stress: Not on file  Social Connections: Not on file     Review of Systems: A 12 point ROS discussed and pertinent positives are indicated in the HPI above.  All other systems are negative.  Review of Systems  Constitutional: Negative for chills and fever.  Respiratory: Negative for shortness of breath and wheezing.   Cardiovascular: Negative for chest pain and palpitations.  Gastrointestinal: Negative for abdominal pain.  Neurological: Negative for headaches.  Psychiatric/Behavioral: Negative for behavioral problems and confusion.    Vital Signs: BP 96/66 (BP Location: Left Arm)   Pulse 67   Temp 97.7 F (36.5 C)   Resp 16   SpO2 96%   Physical Exam Vitals and nursing note reviewed.  Constitutional:      General: He is not in acute distress. Cardiovascular:     Rate and Rhythm: Normal rate and regular rhythm.     Heart sounds: Normal heart sounds. No murmur heard.   Pulmonary:     Effort: Pulmonary effort is normal. No respiratory distress.     Breath sounds: Normal breath sounds. No wheezing.  Skin:    General: Skin is warm and dry.  Neurological:     Mental Status: He is alert and oriented to person, place, and time.     Comments: Moving all extremities.      MD Evaluation Airway: WNL Heart: WNL Abdomen: WNL Chest/ Lungs: WNL ASA  Classification: 3 Mallampati/Airway Score: Two   Imaging: CT Angio Head W or Wo Contrast  Result Date: 04/06/2021 CLINICAL DATA:  Right arm spasms, headache, dizziness and speech changes. EXAM: CT ANGIOGRAPHY HEAD AND NECK TECHNIQUE: Multidetector CT imaging of the head and neck was performed using the standard protocol during bolus administration of intravenous contrast.  Multiplanar CT image reconstructions and MIPs were obtained to evaluate the vascular anatomy. Carotid stenosis measurements (when applicable) are obtained utilizing NASCET criteria, using the distal internal carotid diameter as the denominator. CONTRAST:  86mL OMNIPAQUE IOHEXOL 350 MG/ML SOLN COMPARISON:  None. FINDINGS: CTA NECK FINDINGS SKELETON: There is no bony spinal canal stenosis. No lytic or blastic lesion. OTHER NECK: Normal pharynx, larynx and major salivary glands. No cervical lymphadenopathy. Unremarkable thyroid gland. UPPER CHEST: No pneumothorax or pleural effusion. No nodules or masses. AORTIC ARCH: There is no calcific atherosclerosis of the aortic arch. There is no aneurysm, dissection or hemodynamically significant stenosis of the visualized portion of the aorta. Conventional 3 vessel aortic branching pattern. The visualized proximal subclavian arteries are widely patent. RIGHT CAROTID SYSTEM: Normal without aneurysm, dissection or stenosis. LEFT CAROTID SYSTEM: Normal without aneurysm, dissection or stenosis. VERTEBRAL ARTERIES: Left dominant configuration. Both origins are clearly patent. There is no dissection, occlusion or flow-limiting stenosis to the skull base (V1-V3 segments). CTA HEAD FINDINGS POSTERIOR CIRCULATION: --Vertebral arteries: Normal V4 segments. --Inferior cerebellar arteries: Normal. --Basilar artery: Normal. --Superior cerebellar arteries: Normal. --Posterior cerebral arteries (PCA): Normal. ANTERIOR CIRCULATION: --Intracranial internal carotid arteries: Normal. --Anterior cerebral arteries (ACA): Normal. Both A1 segments are present. Patent anterior communicating  artery (a-comm). --Middle cerebral arteries (MCA): Normal. VENOUS SINUSES: As permitted by contrast timing, patent. ANATOMIC VARIANTS: None Review of the MIP images confirms the above findings. IMPRESSION: Normal CTA of the head and neck. Electronically Signed   By: Ulyses Jarred M.D.   On: 04/06/2021 19:28   CT  HEAD WO CONTRAST  Result Date: 04/06/2021 CLINICAL DATA:  Dizziness, memory loss.  History of migraines EXAM: CT HEAD WITHOUT CONTRAST TECHNIQUE: Contiguous axial images were obtained from the base of the skull through the vertex without intravenous contrast. COMPARISON:  None. FINDINGS: Brain: Increased density material within the subarachnoid spaces of the high left frontal lobe extending over approximately a 5.3 cm area (series 3, images 26-29). No definite underlying mass lesion. No edema. No hydrocephalus. No large territory acute infarction. Vascular: No hyperdense vessel or unexpected calcification. Skull: Normal. Negative for fracture or focal lesion. Sinuses/Orbits: No acute finding. Other: None. IMPRESSION: Findings suspicious for acute subarachnoid hemorrhage in the high left frontal lobe. No definite underlying mass lesion. Further evaluation with MRI of the brain is recommended. Critical Value/emergent results were called by telephone at the time of interpretation on 04/06/2021 at 6:13 pm to provider Dr Darl Householder, who verbally acknowledged these results. Electronically Signed   By: Davina Poke D.O.   On: 04/06/2021 18:14   CT Angio Neck W and/or Wo Contrast  Result Date: 04/06/2021 CLINICAL DATA:  Right arm spasms, headache, dizziness and speech changes. EXAM: CT ANGIOGRAPHY HEAD AND NECK TECHNIQUE: Multidetector CT imaging of the head and neck was performed using the standard protocol during bolus administration of intravenous contrast. Multiplanar CT image reconstructions and MIPs were obtained to evaluate the vascular anatomy. Carotid stenosis measurements (when applicable) are obtained utilizing NASCET criteria, using the distal internal carotid diameter as the denominator. CONTRAST:  52mL OMNIPAQUE IOHEXOL 350 MG/ML SOLN COMPARISON:  None. FINDINGS: CTA NECK FINDINGS SKELETON: There is no bony spinal canal stenosis. No lytic or blastic lesion. OTHER NECK: Normal pharynx, larynx and major  salivary glands. No cervical lymphadenopathy. Unremarkable thyroid gland. UPPER CHEST: No pneumothorax or pleural effusion. No nodules or masses. AORTIC ARCH: There is no calcific atherosclerosis of the aortic arch. There is no aneurysm, dissection or hemodynamically significant stenosis of the visualized portion of the aorta. Conventional 3 vessel aortic branching pattern. The visualized proximal subclavian arteries are widely patent. RIGHT CAROTID SYSTEM: Normal without aneurysm, dissection or stenosis. LEFT CAROTID SYSTEM: Normal without aneurysm, dissection or stenosis. VERTEBRAL ARTERIES: Left dominant configuration. Both origins are clearly patent. There is no dissection, occlusion or flow-limiting stenosis to the skull base (V1-V3 segments). CTA HEAD FINDINGS POSTERIOR CIRCULATION: --Vertebral arteries: Normal V4 segments. --Inferior cerebellar arteries: Normal. --Basilar artery: Normal. --Superior cerebellar arteries: Normal. --Posterior cerebral arteries (PCA): Normal. ANTERIOR CIRCULATION: --Intracranial internal carotid arteries: Normal. --Anterior cerebral arteries (ACA): Normal. Both A1 segments are present. Patent anterior communicating artery (a-comm). --Middle cerebral arteries (MCA): Normal. VENOUS SINUSES: As permitted by contrast timing, patent. ANATOMIC VARIANTS: None Review of the MIP images confirms the above findings. IMPRESSION: Normal CTA of the head and neck. Electronically Signed   By: Ulyses Jarred M.D.   On: 04/06/2021 19:28   MR Brain W and Wo Contrast  Result Date: 04/06/2021 CLINICAL DATA:  Transient ischemic attack.  Intracranial hemorrhage EXAM: MRI HEAD WITHOUT AND WITH CONTRAST TECHNIQUE: Multiplanar, multiecho pulse sequences of the brain and surrounding structures were obtained without and with intravenous contrast. CONTRAST:  8.60mL GADAVIST GADOBUTROL 1 MMOL/ML IV SOLN COMPARISON:  None. FINDINGS: Brain:  There is no acute infarct. There is hyperintensity on the FLAIR  sequence at the anterior left frontal lobe and anterior right parietal lobe. These areas show mild contrast enhancement, particularly the left frontal areas. No midline shift or other mass effect. There is multifocal periventricular white matter hyperintensity, most often a result of chronic microvascular ischemia. Vascular: Major flow voids are preserved. Skull and upper cervical spine: Normal calvarium and skull base. Visualized upper cervical spine and soft tissues are normal. Sinuses/Orbits:No paranasal sinus fluid levels or advanced mucosal thickening. No mastoid or middle ear effusion. Normal orbits. IMPRESSION: 1. Areas of subarachnoid/pial contrast enhancement over both convexities, left more than right. Reactive enhancement in the setting of subarachnoid hemorrhage is favored. Other possibilities include meningitis and subacute ischemia. 2. Findings of chronic microvascular ischemia. Electronically Signed   By: Ulyses Jarred M.D.   On: 04/06/2021 21:11   CT CHEST ABDOMEN PELVIS WO CONTRAST  Result Date: 04/07/2021 CLINICAL DATA:  Cancer of unknown primary, staging Brain lesion. EXAM: CT CHEST, ABDOMEN AND PELVIS WITHOUT CONTRAST TECHNIQUE: Multidetector CT imaging of the chest, abdomen and pelvis was performed following the standard protocol without IV contrast. COMPARISON:  Remote abdominopelvic CT 03/05/2006 FINDINGS: CT CHEST FINDINGS Cardiovascular: Mild aortic atherosclerosis without aneurysm. Normal heart size. No pericardial effusion. Mediastinum/Nodes: Moderate-sized hiatal hernia with irregular wall thickening of the herniated stomach. No proximal esophageal dilatation. No visualized thyroid nodule. No enlarged mediastinal lymph nodes. Limited hilar assessment on this noncontrast exam. Lungs/Pleura: Mild biapical pleuroparenchymal scarring. No acute or focal airspace disease. Occasional areas of fissural thickening of the left inter lobar fissure without discrete pulmonary nodule. There is no  pulmonary mass. Trachea and central bronchi are patent. No pleural fluid. Musculoskeletal: No focal bone lesion or acute osseous abnormality. No chest wall soft tissue abnormality. CT ABDOMEN PELVIS FINDINGS Hepatobiliary: Assessment for liver lesions is limited on this noncontrast exam. Allowing for this, no focal hepatic abnormality is seen. Small gallstone within the gallbladder without pericholecystic inflammation or biliary dilatation. Pancreas: No ductal dilatation or inflammation. No evidence of pancreatic mass on this noncontrast exam. Spleen: Normal in size without focal abnormality. Adrenals/Urinary Tract: Normal adrenal glands. There is excreted IV contrast in both renal collecting systems from prior head and neck CTA. 3.8 cm simple cyst in the upper left kidney. 2.8 cm simple cyst in the anterior mid left kidney. No evidence of solid renal lesion. Excreted IV contrast in the urinary bladder, no bladder wall thickening. Stomach/Bowel: Lack of enteric contrast limits detailed assessment. Small to moderate hiatal hernia with wall thickening of the herniated stomach. Ingested material within the stomach. There is a duodenal diverticulum without acute inflammation. No small bowel obstruction or inflammation. No obvious small bowel lesion. Normal appendix. Multifocal colonic diverticulosis. No evidence of diverticulitis. Moderate colonic stool burden. No obvious colonic mass on this unenhanced exam. There is mild mural hypertrophy of the sigmoid. Vascular/Lymphatic: Aortic and branch atherosclerosis. No aortic aneurysm. Small periportal and retroperitoneal lymph nodes are not enlarged by size criteria. Reproductive: Enlarged prostate gland spanning 5.8 cm. Other: No ascites. No free air. No evidence of omental disease. Tiny fat containing umbilical hernia. No subcutaneous soft tissue abnormality. Musculoskeletal: Postsurgical change in both proximal femoral with ghost tracks from decompression for femoral head  avascular necrosis. K-wires remain in place. No focal bone lesion. IMPRESSION: 1. Moderate-sized hiatal hernia with irregular wall thickening of the herniated stomach. Endoscopy recommended to exclude neoplasm. 2. No other evidence of primary malignancy or metastatic disease in the  chest, abdomen, or pelvis on this noncontrast exam. 3. Colonic diverticulosis without diverticulitis. 4. Enlarged prostate gland. 5. Cholelithiasis without gallbladder inflammation. Aortic Atherosclerosis (ICD10-I70.0). Electronically Signed   By: Keith Rake M.D.   On: 04/07/2021 01:55    Labs:  CBC: Recent Labs    04/06/21 1810 04/06/21 1829  WBC 4.7  --   HGB 15.1 14.6  HCT 43.9 43.0  PLT 218  --     COAGS: Recent Labs    04/06/21 1810  INR 1.0  APTT 31    BMP: Recent Labs    04/06/21 1810 04/06/21 1829 04/07/21 0435  NA 137 138 137  K 3.8 3.8 3.6  CL 104 102 106  CO2 26  --  25  GLUCOSE 102* 100* 124*  BUN 18 21 14   CALCIUM 9.3  --  8.7*  CREATININE 0.85 0.90 0.86  GFRNONAA >60  --  >60    LIVER FUNCTION TESTS: Recent Labs    04/06/21 1810  BILITOT 0.6  AST 20  ALT 22  ALKPHOS 61  PROT 7.2  ALBUMIN 4.2     Assessment and Plan:  Worsening headache and RUE jerking (presumed focal seizure) in setting of leptomeningeal enhancement (seen on MR brain) without known cause (broad differential diagnosis including subarachnoid hemorrhage from RCVS, vasculitis, autoimmune meningitis/cerebritis including neurosarcoid/rheumatoid/lupus, intravascular lymphoma, metastatic disease). Plan for image-guided diagnostic cerebral arteriogram in IR with Dr. Estanislado Pandy tentatively for today pending IR schedule. Patient is NPO. Afebrile and WBCs WNL. Ok to proceed with Lovenox use per Dr. Estanislado Pandy. INR 1.0 04/06/2021.  Risks and benefits of diagnostic cerebral angiogram were discussed with the patient including, but not limited to bleeding, infection, vascular injury, stroke, or contrast  induced renal failure. This interventional procedure involves the use of X-rays and because of the nature of the planned procedure, it is possible that we will have prolonged use of X-ray fluoroscopy. Potential radiation risks to you include (but are not limited to) the following: - A slightly elevated risk for cancer  several years later in life. This risk is typically less than 0.5% percent. This risk is low in comparison to the normal incidence of human cancer, which is 33% for women and 50% for men according to the [employer]. - Radiation induced injury can include skin redness, resembling a rash, tissue breakdown / ulcers and hair loss (which can be temporary or permanent).  The likelihood of either of these occurring depends on the difficulty of the procedure and whether you are sensitive to radiation due to previous procedures, disease, or genetic conditions.  IF your procedure requires a prolonged use of radiation, you will be notified and given written instructions for further action.  It is your responsibility to monitor the irradiated area for the 2 weeks following the procedure and to notify your physician if you are concerned that you have suffered a radiation induced injury.   All of the patient's questions were answered, patient is agreeable to proceed. Consent signed and in IR control room.   Thank you for this interesting consult.  I greatly enjoyed Sac and look forward to participating in their care.  A copy of this report was sent to the requesting provider on this date.  Electronically Signed: Earley Abide, PA-C 04/08/2021, 9:44 AM   I spent a total of 20 Minutes in face to face in clinical consultation, greater than 50% of which was counseling/coordinating care for headache/diagnostic cerebral arteriogram.

## 2021-04-08 NOTE — Consult Note (Addendum)
Referring Provider:  [facility]         Primary Care Physician:  Pcp, No Primary Gastroenterologist:  Althia Forts We were asked to see this patient for:    Abnormal CT scan               ASSESSMENT / PLAN:   # 60 yo male admitted with abnormal RUE movements ( ? Focal seizure), migraine and dizziness. CT scan of head is abnormal. Undergoing evalution for possible subarachnoid hemorrhage of left frontal lobe vrs inflammatory / infectious / malignant process. For diagnostic angiogram with IR today.  Several labs and CSF studies are still pending.   # Gastric wall thickening. Non-contrast CT scan remarkable for a moderate sized hiatal hernia with irregular wall thickening of the herniated stomach. Patient asymptomatic from GI standpoint . We have been asked to evaluate findings as part of ongoing effort to exclude a metastatic lesion. .  --We briefly discussed EGD, especially if pending studies are unrevealing. He is open to endoscopic evaluation   # Asymptomatic cholelithiasis    HPI:                                                                                                                             Chief Complaint:  None from patient. Abnormal stomach on CT scan    Ralph Simpson is a 60 y.o. male with a past medical history significant for avascular necrosis of both hips s/p fibular grafting in 1999,   Patient presented to ED 521 with complaints of dizziness, abnormal RUE movement and migraine. CT head showed left subarachnoid hemorrhage.  Neurosurgery was consulted, no acute neuro surgical intervention required. Neurology evaluated and has concern for [facility] vrs infectious / inflammatory / occult process. Workup is progress. CSF did reveal increasing RBC count so SAH still being considered along with other potential etiologies not limited to rheumatoid meningitis, lupus cerebritis, lymphovascular lymphoma metastatic disease. He is for a diagnostic cerebral angiogram in IR today.     As part of malignancy evaluation patient had a noncontrast CT chest / abd / pelvis.  Lack of enteric contrast limited details but there was a moderate sized hiatal hernia with irregular wall thickening of the herniated stomach.  Small gallstone within the gallbladder.  Duodenal diverticulum without inflammation.  Normal appendix.  Multifocal colonic diverticulosis.  No obvious colonic mass.  Mild mural hypertrophy of the sigmoid. In ongoing effort to rule out malignancy we were asked to address gastric wall thickening on CT scan   Patient has no abdominal pain. He has chronic heartburn but doesn't treat the occasional symptoms because he doesn't like to take medication. He has no chest pain. No dysphagia. Grandmother had "throat cancer". No known [place] of gastric cancer. He does report weight loss over the last year. Though he cannot quantify amount, his pant size has gotten smaller, He attributes weight loss to dietary changes after being diagnosed with diabetes a year ago.  Patient has no lower GI complaints such as bowel changes or blood in stool. He has never had a screening colonoscopy. No known [place] of colon cancer.     PREVIOUS ENDOSCOPIC EVALUATIONS / PERTINENT STUDIES   None    Past Medical History:  Diagnosis Date  . Allergy   . GERD (gastroesophageal reflux disease)   . Migraine     Past Surgical History:  Procedure Laterality Date  . HERNIA REPAIR    . HIP SURGERY      Prior to Admission medications   Medication Sig Start Date End Date Taking? Authorizing Provider  aspirin-acetaminophen-caffeine (EXCEDRIN MIGRAINE) [phone] MG tablet Take 2 tablets by mouth every 6 (six) hours as needed for headache or migraine.   Yes [provider]  glucose blood (ACCU-CHEK AVIVA PLUS) test strip BLOOD SUGAR ONCE DAILY OR AS DIRECTED 02/08/21  Yes Baity, Coralie Keens, NP  Multiple Vitamins-Minerals (PRESERVISION AREDS PO) Take 1 tablet by mouth in the morning and at bedtime.   Yes  [provider]  OVER THE COUNTER MEDICATION Take 1 capsule by mouth with breakfast, with lunch, and with evening meal. Reishi Mushroom   Yes [provider]  OVER THE COUNTER MEDICATION Take 2 capsules by mouth with breakfast, with lunch, and with evening meal. Natural Glucocil- total blood sugar   Yes [provider]  OVER THE COUNTER MEDICATION Take 1 tablet by mouth daily. bosweilla   Yes [provider]    Current Facility-Administered Medications  Medication Dose Route Frequency Provider Last Rate Last Admin  . enoxaparin (LOVENOX) injection 40 mg  40 mg Subcutaneous QHS Tu, Ching T, DO   40 mg at 04/07/21 2133  . levETIRAcetam (KEPPRA) tablet 500 mg  500 mg Oral BID Barb Merino, MD   500 mg at 04/08/21 1254  . prochlorperazine (COMPAZINE) injection 10 mg  10 mg Intravenous Q6H PRN Tu, Ching T, DO      . sodium chloride flush (NS) 0.9 % injection 3 mL  3 mL Intravenous Once Drenda Freeze, MD        Allergies as of 04/06/2021  . (No Known Allergies)    Family History  Problem Relation Age of Onset  . Arthritis Mother   . Stroke Mother   . Diabetes Father   . Heart disease Father   . Hyperlipidemia Father   . Hypertension Father   . Kidney disease Maternal Grandfather     Social History   Socioeconomic History  . Marital status: Single    Spouse name: Not on file  . Number of children: Not on file  . Years of education: Not on file  . Highest education level: Not on file  Occupational History  . Not on file  Tobacco Use  . Smoking status: Never Smoker  . Smokeless tobacco: Never Used  Substance and Sexual Activity  . Alcohol use: Yes    Comment: occasional  . Drug use: No  . Sexual activity: Not on file  Other Topics Concern  . Not on file  Social History Narrative  . Not on file   Social Determinants of Health   Financial Resource Strain: Not on file  Food Insecurity: Not on file  Transportation Needs: Not on  file  Physical Activity: Not on file  Stress: Not on file  Social Connections: Not on file  Intimate Partner Violence: Not on file    Review of Systems: All systems reviewed and negative except where noted in HPI.  OBJECTIVE:    Physical Exam: Vital signs in last 24 hours: Temp:  [97.5 F (36.4 C)-97.9 F (36.6 C)] 97.9 F (36.6 C) (05/23 1101) Pulse Rate:  [61-86] 67 (05/23 0437) Resp:  [16-18] 16 (05/23 0437) BP: (96-119)/(66-83) 114/83 (05/23 1101) SpO2:  [94 %-100 %] 100 % (05/23 1101) Last BM Date:  (PTA) General:   Alert  male in NAD Psych:  Pleasant, cooperative. Normal mood and affect. Eyes:  Pupils equal, sclera clear, no icterus.   Conjunctiva pink. Ears:  Normal auditory acuity. Nose:  No deformity, discharge,  or lesions. Neck:  Supple; no masses Lungs:  Clear throughout to auscultation.   No wheezes, crackles, or rhonchi.  Heart:  Regular rate and rhythm; no murmurs, no lower extremity edema Abdomen:  Soft, non-distended, nontender, BS active, no palp mass   Rectal:  Deferred  Msk:  Symmetrical without gross deformities. . Neurologic:  Alert and  oriented x4;  grossly normal neurologically. Skin:  Intact without significant lesions or rashes.  Scheduled inpatient medications . enoxaparin (LOVENOX) injection  40 mg Subcutaneous QHS  . levETIRAcetam  500 mg Oral BID  . sodium chloride flush  3 mL Intravenous Once    Intake/Output from previous day: No intake/output data recorded. Intake/Output this shift: Total I/O In: 360 [P.O.:360] Out: 851 [Urine:850; Stool:1]   Lab Results: Recent Labs    04/06/21 1810 04/06/21 1829  WBC 4.7  --   HGB 15.1 14.6  HCT 43.9 43.0  PLT 218  --    BMET Recent Labs    04/06/21 1810 04/06/21 1829 04/07/21 0435  NA 137 138 137  K 3.8 3.8 3.6  CL 104 102 106  CO2 26  --  25  GLUCOSE 102* 100* 124*  BUN 18 21 14   CREATININE 0.85 0.90 0.86  CALCIUM 9.3  --  8.7*   LFT Recent Labs    04/06/21 1810   PROT 7.2  ALBUMIN 4.2  AST 20  ALT 22  ALKPHOS 61  BILITOT 0.6   PT/INR Recent Labs    04/06/21 1810  LABPROT 13.0  INR 1.0   Hepatitis Panel No results for input(s): HEPBSAG, HCVAB, HEPAIGM, HEPBIGM in the last 72 hours.   . CBC Latest Ref Rng & Units 04/06/2021 04/06/2021 11/11/2015  WBC 4.0 - 10.5 K/uL - 4.7 3.9(L)  Hemoglobin 13.0 - 17.0 g/dL 14.6 15.1 14.9  Hematocrit 39.0 - 52.0 % 43.0 43.9 44.7  Platelets 150 - 400 K/uL - 218 189    . CMP Latest Ref Rng & Units 04/07/2021 04/06/2021 04/06/2021  Glucose 70 - 99 mg/dL 124(H) 100(H) 102(H)  BUN 8 - 23 mg/dL 14 21 18   Creatinine 0.61 - 1.24 mg/dL 0.86 0.90 0.85  Sodium 135 - 145 mmol/L 137 138 137  Potassium 3.5 - 5.1 mmol/L 3.6 3.8 3.8  Chloride 98 - 111 mmol/L 106 102 104  CO2 22 - 32 mmol/L 25 - 26  Calcium 8.9 - 10.3 mg/dL 8.7(L) - 9.3  Total Protein 6.5 - 8.1 g/dL - - 7.2  Total Bilirubin 0.3 - 1.2 mg/dL - - 0.6  Alkaline Phos 38 - 126 U/L - - 61  AST 15 - 41 U/L - - 20  ALT 0 - 44 U/L - - 22   Studies/Results: CT Angio Head W or Wo Contrast  Result Date: 04/06/2021 CLINICAL DATA:  Right arm spasms, headache, dizziness and speech changes. EXAM: CT ANGIOGRAPHY HEAD AND NECK TECHNIQUE: Multidetector CT imaging of the head and  neck was performed using the standard protocol during bolus administration of intravenous contrast. Multiplanar CT image reconstructions and MIPs were obtained to evaluate the vascular anatomy. Carotid stenosis measurements (when applicable) are obtained utilizing NASCET criteria, using the distal internal carotid diameter as the denominator. CONTRAST:  2mL OMNIPAQUE IOHEXOL 350 MG/ML SOLN COMPARISON:  None. FINDINGS: CTA NECK FINDINGS SKELETON: There is no bony spinal canal stenosis. No lytic or blastic lesion. OTHER NECK: Normal pharynx, larynx and major salivary glands. No cervical lymphadenopathy. Unremarkable thyroid gland. UPPER CHEST: No pneumothorax or pleural effusion. No nodules or  masses. AORTIC ARCH: There is no calcific atherosclerosis of the aortic arch. There is no aneurysm, dissection or hemodynamically significant stenosis of the visualized portion of the aorta. Conventional 3 vessel aortic branching pattern. The visualized proximal subclavian arteries are widely patent. RIGHT CAROTID SYSTEM: Normal without aneurysm, dissection or stenosis. LEFT CAROTID SYSTEM: Normal without aneurysm, dissection or stenosis. VERTEBRAL ARTERIES: Left dominant configuration. Both origins are clearly patent. There is no dissection, occlusion or flow-limiting stenosis to the skull base (V1-V3 segments). CTA HEAD FINDINGS POSTERIOR CIRCULATION: --Vertebral arteries: Normal V4 segments. --Inferior cerebellar arteries: Normal. --Basilar artery: Normal. --Superior cerebellar arteries: Normal. --Posterior cerebral arteries (PCA): Normal. ANTERIOR CIRCULATION: --Intracranial internal carotid arteries: Normal. --Anterior cerebral arteries (ACA): Normal. Both A1 segments are present. Patent anterior communicating artery (a-comm). --Middle cerebral arteries (MCA): Normal. VENOUS SINUSES: As permitted by contrast timing, patent. ANATOMIC VARIANTS: None Review of the MIP images confirms the above findings. IMPRESSION: Normal CTA of the head and neck. Electronically Signed   By: Ulyses Jarred M.D.   On: 04/06/2021 19:28   CT HEAD WO CONTRAST  Result Date: 04/06/2021 CLINICAL DATA:  Dizziness, memory loss.  History of migraines EXAM: CT HEAD WITHOUT CONTRAST TECHNIQUE: Contiguous axial images were obtained from the base of the skull through the vertex without intravenous contrast. COMPARISON:  None. FINDINGS: Brain: Increased density material within the subarachnoid spaces of the high left frontal lobe extending over approximately a 5.3 cm area (series 3, images 26-29). No definite underlying mass lesion. No edema. No hydrocephalus. No large territory acute infarction. Vascular: No hyperdense vessel or unexpected  calcification. Skull: Normal. Negative for fracture or focal lesion. Sinuses/Orbits: No acute finding. Other: None. IMPRESSION: Findings suspicious for acute subarachnoid hemorrhage in the high left frontal lobe. No definite underlying mass lesion. Further evaluation with MRI of the brain is recommended. Critical Value/emergent results were called by telephone at the time of interpretation on 04/06/2021 at 6:13 pm to provider Dr Darl Householder, who verbally acknowledged these results. Electronically Signed   By: Davina Poke D.O.   On: 04/06/2021 18:14   CT Angio Neck W and/or Wo Contrast  Result Date: 04/06/2021 CLINICAL DATA:  Right arm spasms, headache, dizziness and speech changes. EXAM: CT ANGIOGRAPHY HEAD AND NECK TECHNIQUE: Multidetector CT imaging of the head and neck was performed using the standard protocol during bolus administration of intravenous contrast. Multiplanar CT image reconstructions and MIPs were obtained to evaluate the vascular anatomy. Carotid stenosis measurements (when applicable) are obtained utilizing NASCET criteria, using the distal internal carotid diameter as the denominator. CONTRAST:  56mL OMNIPAQUE IOHEXOL 350 MG/ML SOLN COMPARISON:  None. FINDINGS: CTA NECK FINDINGS SKELETON: There is no bony spinal canal stenosis. No lytic or blastic lesion. OTHER NECK: Normal pharynx, larynx and major salivary glands. No cervical lymphadenopathy. Unremarkable thyroid gland. UPPER CHEST: No pneumothorax or pleural effusion. No nodules or masses. AORTIC ARCH: There is no calcific atherosclerosis of the aortic  arch. There is no aneurysm, dissection or hemodynamically significant stenosis of the visualized portion of the aorta. Conventional 3 vessel aortic branching pattern. The visualized proximal subclavian arteries are widely patent. RIGHT CAROTID SYSTEM: Normal without aneurysm, dissection or stenosis. LEFT CAROTID SYSTEM: Normal without aneurysm, dissection or stenosis. VERTEBRAL ARTERIES: Left  dominant configuration. Both origins are clearly patent. There is no dissection, occlusion or flow-limiting stenosis to the skull base (V1-V3 segments). CTA HEAD FINDINGS POSTERIOR CIRCULATION: --Vertebral arteries: Normal V4 segments. --Inferior cerebellar arteries: Normal. --Basilar artery: Normal. --Superior cerebellar arteries: Normal. --Posterior cerebral arteries (PCA): Normal. ANTERIOR CIRCULATION: --Intracranial internal carotid arteries: Normal. --Anterior cerebral arteries (ACA): Normal. Both A1 segments are present. Patent anterior communicating artery (a-comm). --Middle cerebral arteries (MCA): Normal. VENOUS SINUSES: As permitted by contrast timing, patent. ANATOMIC VARIANTS: None Review of the MIP images confirms the above findings. IMPRESSION: Normal CTA of the head and neck. Electronically Signed   By: Ulyses Jarred M.D.   On: 04/06/2021 19:28   MR Brain W and Wo Contrast  Result Date: 04/06/2021 CLINICAL DATA:  Transient ischemic attack.  Intracranial hemorrhage EXAM: MRI HEAD WITHOUT AND WITH CONTRAST TECHNIQUE: Multiplanar, multiecho pulse sequences of the brain and surrounding structures were obtained without and with intravenous contrast. CONTRAST:  8.7mL GADAVIST GADOBUTROL 1 MMOL/ML IV SOLN COMPARISON:  None. FINDINGS: Brain: There is no acute infarct. There is hyperintensity on the FLAIR sequence at the anterior left frontal lobe and anterior right parietal lobe. These areas show mild contrast enhancement, particularly the left frontal areas. No midline shift or other mass effect. There is multifocal periventricular white matter hyperintensity, most often a result of chronic microvascular ischemia. Vascular: Major flow voids are preserved. Skull and upper cervical spine: Normal calvarium and skull base. Visualized upper cervical spine and soft tissues are normal. Sinuses/Orbits:No paranasal sinus fluid levels or advanced mucosal thickening. No mastoid or middle ear effusion. Normal  orbits. IMPRESSION: 1. Areas of subarachnoid/pial contrast enhancement over both convexities, left more than right. Reactive enhancement in the setting of subarachnoid hemorrhage is favored. Other possibilities include meningitis and subacute ischemia. 2. Findings of chronic microvascular ischemia. Electronically Signed   By: Ulyses Jarred M.D.   On: 04/06/2021 21:11   CT CHEST ABDOMEN PELVIS WO CONTRAST  Result Date: 04/07/2021 CLINICAL DATA:  Cancer of unknown primary, staging Brain lesion. EXAM: CT CHEST, ABDOMEN AND PELVIS WITHOUT CONTRAST TECHNIQUE: Multidetector CT imaging of the chest, abdomen and pelvis was performed following the standard protocol without IV contrast. COMPARISON:  Remote abdominopelvic CT 03/05/2006 FINDINGS: CT CHEST FINDINGS Cardiovascular: Mild aortic atherosclerosis without aneurysm. Normal heart size. No pericardial effusion. Mediastinum/Nodes: Moderate-sized hiatal hernia with irregular wall thickening of the herniated stomach. No proximal esophageal dilatation. No visualized thyroid nodule. No enlarged mediastinal lymph nodes. Limited hilar assessment on this noncontrast exam. Lungs/Pleura: Mild biapical pleuroparenchymal scarring. No acute or focal airspace disease. Occasional areas of fissural thickening of the left inter lobar fissure without discrete pulmonary nodule. There is no pulmonary mass. Trachea and central bronchi are patent. No pleural fluid. Musculoskeletal: No focal bone lesion or acute osseous abnormality. No chest wall soft tissue abnormality. CT ABDOMEN PELVIS FINDINGS Hepatobiliary: Assessment for liver lesions is limited on this noncontrast exam. Allowing for this, no focal hepatic abnormality is seen. Small gallstone within the gallbladder without pericholecystic inflammation or biliary dilatation. Pancreas: No ductal dilatation or inflammation. No evidence of pancreatic mass on this noncontrast exam. Spleen: Normal in size without focal abnormality.  Adrenals/Urinary Tract: Normal adrenal glands.  There is excreted IV contrast in both renal collecting systems from prior head and neck CTA. 3.8 cm simple cyst in the upper left kidney. 2.8 cm simple cyst in the anterior mid left kidney. No evidence of solid renal lesion. Excreted IV contrast in the urinary bladder, no bladder wall thickening. Stomach/Bowel: Lack of enteric contrast limits detailed assessment. Small to moderate hiatal hernia with wall thickening of the herniated stomach. Ingested material within the stomach. There is a duodenal diverticulum without acute inflammation. No small bowel obstruction or inflammation. No obvious small bowel lesion. Normal appendix. Multifocal colonic diverticulosis. No evidence of diverticulitis. Moderate colonic stool burden. No obvious colonic mass on this unenhanced exam. There is mild mural hypertrophy of the sigmoid. Vascular/Lymphatic: Aortic and branch atherosclerosis. No aortic aneurysm. Small periportal and retroperitoneal lymph nodes are not enlarged by size criteria. Reproductive: Enlarged prostate gland spanning 5.8 cm. Other: No ascites. No free air. No evidence of omental disease. Tiny fat containing umbilical hernia. No subcutaneous soft tissue abnormality. Musculoskeletal: Postsurgical change in both proximal femoral with ghost tracks from decompression for femoral head avascular necrosis. K-wires remain in place. No focal bone lesion. IMPRESSION: 1. Moderate-sized hiatal hernia with irregular wall thickening of the herniated stomach. Endoscopy recommended to exclude neoplasm. 2. No other evidence of primary malignancy or metastatic disease in the chest, abdomen, or pelvis on this noncontrast exam. 3. Colonic diverticulosis without diverticulitis. 4. Enlarged prostate gland. 5. Cholelithiasis without gallbladder inflammation. Aortic Atherosclerosis (ICD10-I70.0). Electronically Signed   By: Keith Rake M.D.   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  Therefore, I agree with the diagnoses recorded above. In addition,  I have personally interviewed and examined the patient and have personally reviewed any abdominal/pelvic CT scan images.  My additional thoughts are as follows:  My partner Dr. Ardis Hughs came by late yesterday afternoon to see this patient, but he was off the medical floor for testing.  This patient has incidentally discovered hiatal hernia with thickening of the herniated gastric wall, common finding on imaging.  He has no chronic upper digestive symptoms.  Neurologic testing has determined that he has a small subarachnoid hemorrhage and he is to be discharged today for outpatient neurologic follow-up.  He has some questionable seizure activity and will be on antiepileptic therapy for the time being.  He can be discharged today from a GI perspective.  We will arrange outpatient GI clinic follow-up with him to discuss optimal timing of screening colonoscopy and upper endoscopy to evaluate the hiatal hernia.   Nelida Meuse III Office:[phone]

## 2021-04-08 NOTE — Progress Notes (Signed)
Subjective: Patient feels like his headache is improving significantly.  He does describe a previous incident where he bumped his head a few weeks prior to presentation.  Exam: Vitals:   04/08/21 1715 04/08/21 1741  BP: 120/79 127/84  Pulse: 78 78  Resp:    Temp:    SpO2:     Gen: In bed, NAD Resp: non-labored breathing, no acute distress Abd: soft, nt  Neuro: MS: Awake, alert, interactive and appropriate CN: Visual field full, pupils equal round reactive Motor: He has poor effort in bilateral hips, but otherwise 5/5 Sensory: Intact to light touch  Pertinent Labs: CSF WBC 2 CSF RBC 2225 CSF protein 51 CSF glucose 64 RPR negative HIV negative CSF VDRL negative OC bands-pending IgG index test pending Cytology-pending Rheumatoid factor-pending ACE-pending ENA- pending Jo one-pending LFTs are normal  Cerebral angiogram-no evidence of vasculitis or underlying vascular abnormality   Impression: 60 year old with abnormal hyperdense areas on CT most consistent with small subarachnoid as well as left parietal.  Location could be consistent with a coup-contrecoup, but no evidence of parenchymal involvement.  I think that most likely this represents a small subarachnoid from his previous head bump.  I think it would be reasonable to repeat imaging to ensure resolution.  This would also demonstrate if this actually represents malignancy if cytology is negative.   He does describe a previous episode of arm jerking and therefore it is unclear if this was related to his subarachnoid or not.  Either way, with his history of migraines, I would favor using Depakote to Keppra as this could be helpful for both.  Recommendations: 1) start Depakote, load with 1 g orally to this evening and then 500 twice daily 2) one more dose of Keppra to overlap slightly with the Depakote, then stop 3) follow-up above pending labs 4) he can follow-up with outpatient neurology for repeat imaging in 1  to 2 months. 5) work-up of esophageal findings per internal medicine  Roland Rack, MD [facility] [phone]  If 7pm- 7am, please page neurology on call as listed in [place].

## 2021-04-08 NOTE — Progress Notes (Signed)
EEG complete - results pending 

## 2021-04-08 NOTE — Progress Notes (Signed)
Patient is having another procedure done soon and wanted that to be done first. I explained that I cannot guarantee we will be able to come back later today but we would make our best efforts to prioritize his EEG in the morning.

## 2021-04-08 NOTE — Progress Notes (Signed)
PROGRESS NOTE    Ralph Simpson  TFT:[id number] DOB: [birth year] DOA: 04/06/2021 PCP: Pcp, No    Brief Narrative:  60 year old gentleman with history of migraine headache, avascular necrosis of the bilateral hips status post fibular grafting, ambulatory dysfunction, prediabetes and GERD presented to the ER with worsening headache and right upper extremity jerking.  Patient has longstanding history of migraines but no history of seizures.  At the emergency room he was afebrile.  Normotensive.  Unremarkable chemistry.  CT head was initially showing concern about acute subarachnoid hemorrhage in the high left frontal lobe.  CTA of the head and neck demonstrated left frontal hypodensity without evidence of vascular malformation or aneurysm.  MRI brain with leptomeningeal enhancement.  Started on Keppra, seen by neurology and admitted for neurological work-up.   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  Probably responsible for focal seizure.  Remains on Keppra 500 mg twice a day, will change to oral today. Multiple investigations in process Spinal tap and lumbar puncture, normal examination so far. DD includes neurosarcoid, rheumatoid meningitis, lupus cerebritis and other autoimmune meningitis or cerebritis Less likely stroke or metastatic disease.  Plan: Multiple serological test including lupus panel, rheumatoid factor, anti-SSA and SSB, serum and CSF ACE levels, viral studies, fungal culture pending. EEG, scheduled for today. CT scan of the chest abdomen pelvis essentially normal, with some irregular wall thickening of the herniated stomach, mostly consistent with history of GERD.   Discussed with gastroenterology for EGD and  biopsy. Patient for cerebral angiogram today with IR.  Focal seizures: As above.  Improved on oral Keppra.     DVT prophylaxis: enoxaparin (LOVENOX) injection 40 mg Start: 04/06/21 2330   Code Status: Full code Family Communication: Friend at the bedside 5/22. Disposition Plan: Status is: Inpatient  Remains inpatient appropriate because:Inpatient level of care appropriate due to severity of illness   Dispo: The patient is from: Home              Anticipated d/c is to: Home              Patient currently is not medically stable to d/c.   Difficult to place patient No         Consultants:   Neurology  Procedures:   EEG, pending  Antimicrobials:   None   Subjective: Patient seen and examined.  No overnight events.  Denies any complaints.  He was anxious about the procedure.  Objective: Vitals:   04/07/21 1944 04/07/21 2335 04/08/21 0437 04/08/21 1101  BP: 119/72 108/76 96/66 114/83  Pulse: 86 61 67   Resp: 18 17 16    Temp: 97.6 F (36.4 C) (!) 97.5 F (36.4 C) 97.7 F (36.5 C) 97.9 F (36.6 C)  TempSrc: Oral Oral    SpO2: 95% 96% 96% 100%    Intake/Output Summary (Last 24 hours) at 04/08/2021 1227 Last data filed at 04/08/2021 0800 Gross per 24 hour  Intake 360 ml  Output 851 ml  Net -491 ml   There were no vitals filed for this visit.  Examination:  General exam: Appears calm and comfortable  He was able to walk around with his cane and go to the bathroom. Respiratory system: Clear to auscultation. Respiratory effort normal. Cardiovascular system: S1 &  S2 heard, RRR. No JVD, murmurs, rubs, gallops or clicks. No pedal edema. Gastrointestinal system: Abdomen is nondistended, soft and nontender. No organomegaly or masses felt. Normal bowel sounds heard. Central nervous system: Alert and oriented. No focal neurological deficits. Extremities: Symmetric 5 x 5 power. Patient has some stiffness of both hip resulting in weakness of the legs, however no  obvious neurological deficits on all extremities.    Data Reviewed: I have personally reviewed following labs and imaging studies  CBC: Recent Labs  Lab 04/06/21 1810 04/06/21 1829  WBC 4.7  --   NEUTROABS 2.6  --   HGB 15.1 14.6  HCT 43.9 43.0  MCV 85.2  --   PLT 218  --    Basic Metabolic Panel: Recent Labs  Lab 04/06/21 1810 04/06/21 1829 04/07/21 0435  NA 137 138 137  K 3.8 3.8 3.6  CL 104 102 106  CO2 26  --  25  GLUCOSE 102* 100* 124*  BUN 18 21 14   CREATININE 0.85 0.90 0.86  CALCIUM 9.3  --  8.7*   GFR: CrCl cannot be calculated (Unknown ideal weight.). Liver Function Tests: Recent Labs  Lab 04/06/21 1810  AST 20  ALT 22  ALKPHOS 61  BILITOT 0.6  PROT 7.2  ALBUMIN 4.2   No results for input(s): LIPASE, AMYLASE in the last 168 hours. No results for input(s): AMMONIA in the last 168 hours. Coagulation Profile: Recent Labs  Lab 04/06/21 1810  INR 1.0   Cardiac Enzymes: No results for input(s): CKTOTAL, CKMB, CKMBINDEX, TROPONINI in the last 168 hours. BNP (last 3 results) No results for input(s): PROBNP in the last 8760 hours. HbA1C: No results for input(s): HGBA1C in the last 72 hours. CBG: Recent Labs  Lab 04/08/21 1058  GLUCAP 126*   Lipid Profile: No results for input(s): CHOL, HDL, LDLCALC, TRIG, CHOLHDL, LDLDIRECT in the last 72 hours. Thyroid Function Tests: No results for input(s): TSH, T4TOTAL, FREET4, T3FREE, THYROIDAB in the last 72 hours. Anemia Panel: No results for input(s): VITAMINB12, FOLATE, FERRITIN, TIBC, IRON, RETICCTPCT in the last 72 hours. Sepsis Labs: No results for input(s): PROCALCITON, LATICACIDVEN in the last 168 hours.  Recent Results (from the past 240 hour(s))  CSF culture w Gram Stain     Status: None (Preliminary result)   Collection Time: 04/06/21 11:11 PM   Specimen: CSF; Cerebrospinal Fluid  Result Value Ref Range Status   Specimen Description CSF  Final   Special Requests TUBE 2  Final   Gram  Stain   Final    WBC PRESENT, PREDOMINANTLY MONONUCLEAR NO ORGANISMS SEEN CYTOSPIN SMEAR    Culture   Final    NO GROWTH 2 DAYS Performed at [facility], [place] [address], [place], [place] [address]    Report Status PENDING  Incomplete  Culture, fungus without smear     Status: None (Preliminary result)   Collection Time: 04/06/21 11:11 PM   Specimen: CSF; Cerebrospinal Fluid  Result Value Ref Range Status   Specimen Description CSF  Final   Special Requests TUBE 2  Final   Culture   Final    NO FUNGUS ISOLATED AFTER 2 DAYS Performed at [facility] [address] [place], [place] [address]    Report Status PENDING  Incomplete  SARS CORONAVIRUS 2 (TAT 6-24 HRS) Nasopharyngeal Nasopharyngeal Swab     Status: None   Collection Time: 04/07/21  3:32 AM   Specimen: Nasopharyngeal Swab  Result Value Ref Range  Status   SARS Coronavirus 2 NEGATIVE NEGATIVE Final    Comment: (NOTE) SARS-CoV-2 target nucleic acids are NOT DETECTED.  The SARS-CoV-2 RNA is generally detectable in upper and lower respiratory specimens during the acute phase of infection. Negative results do not preclude SARS-CoV-2 infection, do not rule out co-infections with other pathogens, and should not be used as the sole basis for treatment or other patient management decisions. Negative results must be combined with clinical observations, patient history, and epidemiological information. The expected result is Negative.  Fact Sheet for Patients: SugarRoll.be  Fact Sheet for Healthcare Providers: https://www.woods-mathews.com/  This test is not yet approved or cleared by the [place] FDA and  has been authorized for detection and/or diagnosis of SARS-CoV-2 by FDA under an Emergency Use Authorization (EUA). This EUA will remain  in effect (meaning this test can be used) for the duration of the COVID-19 declaration under Se ction 564(b)(1) of the Act, 21  U.S.C. section 360bbb-3(b)(1), unless the authorization is terminated or revoked sooner.  Performed at [facility], [place] [address] [place], [place] [address]   MRSA PCR Screening     Status: None   Collection Time: 04/07/21  5:27 AM   Specimen: Nasal Mucosa; Nasopharyngeal  Result Value Ref Range Status   MRSA by PCR NEGATIVE NEGATIVE Final    Comment:        The GeneXpert MRSA Assay (FDA approved for NASAL specimens only), is one component of a comprehensive MRSA colonization surveillance program. It is not intended to diagnose MRSA infection nor to guide or monitor treatment for MRSA infections. Performed at [facility], [place] [address] [place], [place] [address]          Radiology Studies: CT Angio Head W or Wo Contrast  Result Date: 04/06/2021 CLINICAL DATA:  Right arm spasms, headache, dizziness and speech changes. EXAM: CT ANGIOGRAPHY HEAD AND NECK TECHNIQUE: Multidetector CT imaging of the head and neck was performed using the standard protocol during bolus administration of intravenous contrast. Multiplanar CT image reconstructions and MIPs were obtained to evaluate the vascular anatomy. Carotid stenosis measurements (when applicable) are obtained utilizing NASCET criteria, using the distal internal carotid diameter as the denominator. CONTRAST:  70mL OMNIPAQUE IOHEXOL 350 MG/ML SOLN COMPARISON:  None. FINDINGS: CTA NECK FINDINGS SKELETON: There is no bony spinal canal stenosis. No lytic or blastic lesion. OTHER NECK: Normal pharynx, larynx and major salivary glands. No cervical lymphadenopathy. Unremarkable thyroid gland. UPPER CHEST: No pneumothorax or pleural effusion. No nodules or masses. AORTIC ARCH: There is no calcific atherosclerosis of the aortic arch. There is no aneurysm, dissection or hemodynamically significant stenosis of the visualized portion of the aorta. Conventional 3 vessel aortic branching pattern. The visualized proximal subclavian  arteries are widely patent. RIGHT CAROTID SYSTEM: Normal without aneurysm, dissection or stenosis. LEFT CAROTID SYSTEM: Normal without aneurysm, dissection or stenosis. VERTEBRAL ARTERIES: Left dominant configuration. Both origins are clearly patent. There is no dissection, occlusion or flow-limiting stenosis to the skull base (V1-V3 segments). CTA HEAD FINDINGS POSTERIOR CIRCULATION: --Vertebral arteries: Normal V4 segments. --Inferior cerebellar arteries: Normal. --Basilar artery: Normal. --Superior cerebellar arteries: Normal. --Posterior cerebral arteries (PCA): Normal. ANTERIOR CIRCULATION: --Intracranial internal carotid arteries: Normal. --Anterior cerebral arteries (ACA): Normal. Both A1 segments are present. Patent anterior communicating artery (a-comm). --Middle cerebral arteries (MCA): Normal. VENOUS SINUSES: As permitted by contrast timing, patent. ANATOMIC VARIANTS: None Review of the MIP images confirms the above findings. IMPRESSION: Normal CTA of the head and neck.  Electronically Signed   By: Ulyses Jarred M.D.   On: 04/06/2021 19:28   CT HEAD WO CONTRAST  Result Date: 04/06/2021 CLINICAL DATA:  Dizziness, memory loss.  History of migraines EXAM: CT HEAD WITHOUT CONTRAST TECHNIQUE: Contiguous axial images were obtained from the base of the skull through the vertex without intravenous contrast. COMPARISON:  None. FINDINGS: Brain: Increased density material within the subarachnoid spaces of the high left frontal lobe extending over approximately a 5.3 cm area (series 3, images 26-29). No definite underlying mass lesion. No edema. No hydrocephalus. No large territory acute infarction. Vascular: No hyperdense vessel or unexpected calcification. Skull: Normal. Negative for fracture or focal lesion. Sinuses/Orbits: No acute finding. Other: None. IMPRESSION: Findings suspicious for acute subarachnoid hemorrhage in the high left frontal lobe. No definite underlying mass lesion. Further evaluation with  MRI of the brain is recommended. Critical Value/emergent results were called by telephone at the time of interpretation on 04/06/2021 at 6:13 pm to provider Dr Darl Householder, who verbally acknowledged these results. Electronically Signed   By: Davina Poke D.O.   On: 04/06/2021 18:14   CT Angio Neck W and/or Wo Contrast  Result Date: 04/06/2021 CLINICAL DATA:  Right arm spasms, headache, dizziness and speech changes. EXAM: CT ANGIOGRAPHY HEAD AND NECK TECHNIQUE: Multidetector CT imaging of the head and neck was performed using the standard protocol during bolus administration of intravenous contrast. Multiplanar CT image reconstructions and MIPs were obtained to evaluate the vascular anatomy. Carotid stenosis measurements (when applicable) are obtained utilizing NASCET criteria, using the distal internal carotid diameter as the denominator. CONTRAST:  31mL OMNIPAQUE IOHEXOL 350 MG/ML SOLN COMPARISON:  None. FINDINGS: CTA NECK FINDINGS SKELETON: There is no bony spinal canal stenosis. No lytic or blastic lesion. OTHER NECK: Normal pharynx, larynx and major salivary glands. No cervical lymphadenopathy. Unremarkable thyroid gland. UPPER CHEST: No pneumothorax or pleural effusion. No nodules or masses. AORTIC ARCH: There is no calcific atherosclerosis of the aortic arch. There is no aneurysm, dissection or hemodynamically significant stenosis of the visualized portion of the aorta. Conventional 3 vessel aortic branching pattern. The visualized proximal subclavian arteries are widely patent. RIGHT CAROTID SYSTEM: Normal without aneurysm, dissection or stenosis. LEFT CAROTID SYSTEM: Normal without aneurysm, dissection or stenosis. VERTEBRAL ARTERIES: Left dominant configuration. Both origins are clearly patent. There is no dissection, occlusion or flow-limiting stenosis to the skull base (V1-V3 segments). CTA HEAD FINDINGS POSTERIOR CIRCULATION: --Vertebral arteries: Normal V4 segments. --Inferior cerebellar arteries:  Normal. --Basilar artery: Normal. --Superior cerebellar arteries: Normal. --Posterior cerebral arteries (PCA): Normal. ANTERIOR CIRCULATION: --Intracranial internal carotid arteries: Normal. --Anterior cerebral arteries (ACA): Normal. Both A1 segments are present. Patent anterior communicating artery (a-comm). --Middle cerebral arteries (MCA): Normal. VENOUS SINUSES: As permitted by contrast timing, patent. ANATOMIC VARIANTS: None Review of the MIP images confirms the above findings. IMPRESSION: Normal CTA of the head and neck. Electronically Signed   By: Ulyses Jarred M.D.   On: 04/06/2021 19:28   MR Brain W and Wo Contrast  Result Date: 04/06/2021 CLINICAL DATA:  Transient ischemic attack.  Intracranial hemorrhage EXAM: MRI HEAD WITHOUT AND WITH CONTRAST TECHNIQUE: Multiplanar, multiecho pulse sequences of the brain and surrounding structures were obtained without and with intravenous contrast. CONTRAST:  8.66mL GADAVIST GADOBUTROL 1 MMOL/ML IV SOLN COMPARISON:  None. FINDINGS: Brain: There is no acute infarct. There is hyperintensity on the FLAIR sequence at the anterior left frontal lobe and anterior right parietal lobe. These areas show mild contrast enhancement, particularly the left frontal areas. No  midline shift or other mass effect. There is multifocal periventricular white matter hyperintensity, most often a result of chronic microvascular ischemia. Vascular: Major flow voids are preserved. Skull and upper cervical spine: Normal calvarium and skull base. Visualized upper cervical spine and soft tissues are normal. Sinuses/Orbits:No paranasal sinus fluid levels or advanced mucosal thickening. No mastoid or middle ear effusion. Normal orbits. IMPRESSION: 1. Areas of subarachnoid/pial contrast enhancement over both convexities, left more than right. Reactive enhancement in the setting of subarachnoid hemorrhage is favored. Other possibilities include meningitis and subacute ischemia. 2. Findings of  chronic microvascular ischemia. Electronically Signed   By: Deatra Robinson M.D.   On: 04/06/2021 21:11   CT CHEST ABDOMEN PELVIS WO CONTRAST  Result Date: 04/07/2021 CLINICAL DATA:  Cancer of unknown primary, staging Brain lesion. EXAM: CT CHEST, ABDOMEN AND PELVIS WITHOUT CONTRAST TECHNIQUE: Multidetector CT imaging of the chest, abdomen and pelvis was performed following the standard protocol without IV contrast. COMPARISON:  Remote abdominopelvic CT 03/05/2006 FINDINGS: CT CHEST FINDINGS Cardiovascular: Mild aortic atherosclerosis without aneurysm. Normal heart size. No pericardial effusion. Mediastinum/Nodes: Moderate-sized hiatal hernia with irregular wall thickening of the herniated stomach. No proximal esophageal dilatation. No visualized thyroid nodule. No enlarged mediastinal lymph nodes. Limited hilar assessment on this noncontrast exam. Lungs/Pleura: Mild biapical pleuroparenchymal scarring. No acute or focal airspace disease. Occasional areas of fissural thickening of the left inter lobar fissure without discrete pulmonary nodule. There is no pulmonary mass. Trachea and central bronchi are patent. No pleural fluid. Musculoskeletal: No focal bone lesion or acute osseous abnormality. No chest wall soft tissue abnormality. CT ABDOMEN PELVIS FINDINGS Hepatobiliary: Assessment for liver lesions is limited on this noncontrast exam. Allowing for this, no focal hepatic abnormality is seen. Small gallstone within the gallbladder without pericholecystic inflammation or biliary dilatation. Pancreas: No ductal dilatation or inflammation. No evidence of pancreatic mass on this noncontrast exam. Spleen: Normal in size without focal abnormality. Adrenals/Urinary Tract: Normal adrenal glands. There is excreted IV contrast in both renal collecting systems from prior head and neck CTA. 3.8 cm simple cyst in the upper left kidney. 2.8 cm simple cyst in the anterior mid left kidney. No evidence of solid renal lesion.  Excreted IV contrast in the urinary bladder, no bladder wall thickening. Stomach/Bowel: Lack of enteric contrast limits detailed assessment. Small to moderate hiatal hernia with wall thickening of the herniated stomach. Ingested material within the stomach. There is a duodenal diverticulum without acute inflammation. No small bowel obstruction or inflammation. No obvious small bowel lesion. Normal appendix. Multifocal colonic diverticulosis. No evidence of diverticulitis. Moderate colonic stool burden. No obvious colonic mass on this unenhanced exam. There is mild mural hypertrophy of the sigmoid. Vascular/Lymphatic: Aortic and branch atherosclerosis. No aortic aneurysm. Small periportal and retroperitoneal lymph nodes are not enlarged by size criteria. Reproductive: Enlarged prostate gland spanning 5.8 cm. Other: No ascites. No free air. No evidence of omental disease. Tiny fat containing umbilical hernia. No subcutaneous soft tissue abnormality. Musculoskeletal: Postsurgical change in both proximal femoral with ghost tracks from decompression for femoral head avascular necrosis. K-wires remain in place. No focal bone lesion. IMPRESSION: 1. Moderate-sized hiatal hernia with irregular wall thickening of the herniated stomach. Endoscopy recommended to exclude neoplasm. 2. No other evidence of primary malignancy or metastatic disease in the chest, abdomen, or pelvis on this noncontrast exam. 3. Colonic diverticulosis without diverticulitis. 4. Enlarged prostate gland. 5. Cholelithiasis without gallbladder inflammation. Aortic Atherosclerosis (ICD10-I70.0). Electronically Signed   By: Ivette Loyal.D.  On: 04/07/2021 01:55        Scheduled Meds: . enoxaparin (LOVENOX) injection  40 mg Subcutaneous QHS  . levETIRAcetam  500 mg Oral BID  . sodium chloride flush  3 mL Intravenous Once   Continuous Infusions:    LOS: 2 days    Time spent: 28 minutes    Barb Merino, MD [facility] Pager [phone]

## 2021-04-08 NOTE — Procedures (Signed)
S/P 4 vessel cerebral arteriogram RTT CFA approach. Findings. 1.No angiographic evidence of aneurysm,DAVF,AV shunting ,dissections. 2.Venous outflow WNLs. S.Tamim Skog MD

## 2021-04-09 DIAGNOSIS — R9089 Other abnormal findings on diagnostic imaging of central nervous system: Secondary | ICD-10-CM

## 2021-04-09 DIAGNOSIS — R569 Unspecified convulsions: Secondary | ICD-10-CM | POA: Diagnosis not present

## 2021-04-09 DIAGNOSIS — R519 Headache, unspecified: Secondary | ICD-10-CM | POA: Diagnosis not present

## 2021-04-09 LAB — EXTRACTABLE NUCLEAR ANTIGEN ANTIBODY
ENA SM Ab Ser-aCnc: <0.2 AI (ref 0.0–0.9)
Ribonucleic Protein: <0.2 AI (ref 0.0–0.9)
SSA (Ro) (ENA) Antibody, IgG: <0.2 AI (ref 0.0–0.9)
SSB (La) (ENA) Antibody, IgG: <0.2 AI (ref 0.0–0.9)
Scleroderma (Scl-70) (ENA) Antibody, IgG: 1.3 AI — ABNORMAL HIGH (ref 0.0–0.9)
ds DNA Ab: <1 IU/mL (ref 0–9)

## 2021-04-09 LAB — IGG CSF INDEX
Albumin CSF-mCnc: 34 mg/dL (ref 15–55)
Albumin: 4.4 g/dL (ref 3.8–4.8)
CSF IgG Index: 0.5 (ref 0.0–0.7)
IgG (Immunoglobin G), Serum: 910 mg/dL (ref 603–1613)
IgG, CSF: 3.6 mg/dL (ref 0.0–10.3)
IgG/Alb Ratio, CSF: 0.11 (ref 0.00–0.25)

## 2021-04-09 LAB — RHEUMATOID FACTOR: Rheumatoid fact SerPl-aCnc: <10 IU/mL (ref <14.0)

## 2021-04-09 LAB — HSV 1/2 PCR, CSF
HSV-1 DNA: NEGATIVE
HSV-2 DNA: NEGATIVE

## 2021-04-09 LAB — ANTI-JO 1 ANTIBODY, IGG: Anti JO-1: <0.2 AI (ref 0.0–0.9)

## 2021-04-09 LAB — ANGIOTENSIN CONVERTING ENZYME: Angiotensin-Converting Enzyme: 40 U/L (ref 14–82)

## 2021-04-09 LAB — CYTOLOGY - NON PAP

## 2021-04-09 LAB — GLUCOSE, CAPILLARY: Glucose-Capillary: 115 mg/dL — ABNORMAL HIGH (ref 70–99)

## 2021-04-09 MED ORDER — PANTOPRAZOLE SODIUM 40 MG PO TBEC: 40.0000 mg | DELAYED_RELEASE_TABLET | Freq: Every day | ORAL | 11 refills | Status: DC

## 2021-04-09 MED ORDER — DIVALPROEX SODIUM 500 MG PO DR TAB: 500.0000 mg | DELAYED_RELEASE_TABLET | Freq: Two times a day (BID) | ORAL | 2 refills | Status: DC

## 2021-04-09 NOTE — Discharge Summary (Signed)
Physician Discharge Summary  Ralph Simpson DJT:[id number] DOB: 1961-04-16 DOA: 04/06/2021  PCP: Pcp, No  Admit date: 04/06/2021 Discharge date: 04/09/2021  Admitted From: Home Disposition: Home  Recommendations for Outpatient Follow-up:  1. Follow up with PCP in 1-2 weeks 2. Follow-up with gastroenterology as a scheduled 3. Follow-up with neurology, office will call for appointment.  Home Health: Not applicable Equipment/Devices: Not applicable  Discharge Condition: Stable CODE STATUS: Full code Diet recommendation: Low-salt diet  Discharge summary: 60 year old gentleman with history of migraine headache, avascular necrosis of the bilateral hips status post fibular grafting, ambulatory dysfunction, prediabetes and GERD presented to the ER with worsening headache and right upper extremity jerking.  Patient has longstanding history of migraines but no history of seizures.  At the emergency room he was afebrile.  Normotensive.  Unremarkable chemistry.  CT head was initially showing concern about acute subarachnoid hemorrhage in the high left frontal lobe.  CTA of the head and neck demonstrated left frontal hypodensity without evidence of vascular malformation or aneurysm.  MRI brain with leptomeningeal enhancement.  Started on Keppra, seen by neurology and admitted for neurological work-up.   # Leptomeningeal enhancement of the left frontal lobe more than the right frontal lobe: Primary cause unknown.  Probably responsible for focal seizure.  Started on Keppra 500 mg twice a day, neurology recommended Depakote 500 mg twice a day.  Symptoms improved.  Most of the symptoms normalized today. Multiple investigations in process Spinal fluid, normal examination so far. DD includes neurosarcoid, rheumatoid meningitis, lupus cerebritis and other autoimmune meningitis or cerebritis. Less likely stroke or metastatic disease.  Plan: Multiple serological test including lupus panel, rheumatoid  factor, anti-SSA and SSB, serum and CSF ACE levels, viral studies, fungal culture pending.  So far negative. EEG,  no evidence of seizure. CT scan of the chest abdomen pelvis essentially normal, with some irregular wall thickening of the herniated stomach, mostly consistent with history of GERD.   Underwent cerebral angiogram with IR 5/23, widely patent and normal cerebral arteries. GI consulted, outpatient EGD and screening colonoscopy planned. Patient will be discharged home on Depakote, Protonix 40 mg daily. He was seen by outpatient neurology office, he should have most of his serological test back by the time of follow-up appointment.   Discharge Diagnoses:  Principal Problem:   Leptomeningeal enhancement on MRI of brain Active Problems:   Localization-related focal epilepsy with complex partial seizures [facility])   Headache    Discharge Instructions  Discharge Instructions    Ambulatory referral to Neurology   Complete by: As directed    An appointment is requested in approximately: 3 weeks   Call MD for:   Complete by: As directed    Limb stiffness or weakness.   Call MD for:  severe uncontrolled pain   Complete by: As directed    Diet - low sodium heart healthy   Complete by: As directed    Discharge instructions   Complete by: As directed    Avoid taking aspirin or related products.  You can take Tylenol and caffeine.   Increase activity slowly   Complete by: As directed    No wound care   Complete by: As directed      Allergies as of 04/09/2021   No Known Allergies     Medication List    STOP taking these medications   aspirin-acetaminophen-caffeine 250-250-65 MG tablet Commonly known as: EXCEDRIN MIGRAINE     TAKE these medications   Accu-Chek Aviva Plus test strip  Generic drug: glucose blood BLOOD SUGAR ONCE DAILY OR AS DIRECTED   divalproex 500 MG DR tablet Commonly known as: DEPAKOTE Take 1 tablet (500 mg total) by mouth every 12 (twelve) hours.    OVER THE COUNTER MEDICATION Take 1 capsule by mouth with breakfast, with lunch, and with evening meal. Reishi Mushroom   OVER THE COUNTER MEDICATION Take 2 capsules by mouth with breakfast, with lunch, and with evening meal. Natural Glucocil- total blood sugar   OVER THE COUNTER MEDICATION Take 1 tablet by mouth daily. bosweilla   pantoprazole 40 MG tablet Commonly known as: Protonix Take 1 tablet (40 mg total) by mouth daily.   PRESERVISION AREDS PO Take 1 tablet by mouth in the morning and at bedtime.       No Known Allergies  Consultations:  Neurology  Gastroenterology   Procedures/Studies: CT Angio Head W or Wo Contrast  Result Date: 04/06/2021 CLINICAL DATA:  Right arm spasms, headache, dizziness and speech changes. EXAM: CT ANGIOGRAPHY HEAD AND NECK TECHNIQUE: Multidetector CT imaging of the head and neck was performed using the standard protocol during bolus administration of intravenous contrast. Multiplanar CT image reconstructions and MIPs were obtained to evaluate the vascular anatomy. Carotid stenosis measurements (when applicable) are obtained utilizing NASCET criteria, using the distal internal carotid diameter as the denominator. CONTRAST:  31mL OMNIPAQUE IOHEXOL 350 MG/ML SOLN COMPARISON:  None. FINDINGS: CTA NECK FINDINGS SKELETON: There is no bony spinal canal stenosis. No lytic or blastic lesion. OTHER NECK: Normal pharynx, larynx and major salivary glands. No cervical lymphadenopathy. Unremarkable thyroid gland. UPPER CHEST: No pneumothorax or pleural effusion. No nodules or masses. AORTIC ARCH: There is no calcific atherosclerosis of the aortic arch. There is no aneurysm, dissection or hemodynamically significant stenosis of the visualized portion of the aorta. Conventional 3 vessel aortic branching pattern. The visualized proximal subclavian arteries are widely patent. RIGHT CAROTID SYSTEM: Normal without aneurysm, dissection or stenosis. LEFT CAROTID SYSTEM:  Normal without aneurysm, dissection or stenosis. VERTEBRAL ARTERIES: Left dominant configuration. Both origins are clearly patent. There is no dissection, occlusion or flow-limiting stenosis to the skull base (V1-V3 segments). CTA HEAD FINDINGS POSTERIOR CIRCULATION: --Vertebral arteries: Normal V4 segments. --Inferior cerebellar arteries: Normal. --Basilar artery: Normal. --Superior cerebellar arteries: Normal. --Posterior cerebral arteries (PCA): Normal. ANTERIOR CIRCULATION: --Intracranial internal carotid arteries: Normal. --Anterior cerebral arteries (ACA): Normal. Both A1 segments are present. Patent anterior communicating artery (a-comm). --Middle cerebral arteries (MCA): Normal. VENOUS SINUSES: As permitted by contrast timing, patent. ANATOMIC VARIANTS: None Review of the MIP images confirms the above findings. IMPRESSION: Normal CTA of the head and neck. Electronically Signed   By: Ulyses Jarred M.D.   On: 04/06/2021 19:28   CT HEAD WO CONTRAST  Result Date: 04/06/2021 CLINICAL DATA:  Dizziness, memory loss.  History of migraines EXAM: CT HEAD WITHOUT CONTRAST TECHNIQUE: Contiguous axial images were obtained from the base of the skull through the vertex without intravenous contrast. COMPARISON:  None. FINDINGS: Brain: Increased density material within the subarachnoid spaces of the high left frontal lobe extending over approximately a 5.3 cm area (series 3, images 26-29). No definite underlying mass lesion. No edema. No hydrocephalus. No large territory acute infarction. Vascular: No hyperdense vessel or unexpected calcification. Skull: Normal. Negative for fracture or focal lesion. Sinuses/Orbits: No acute finding. Other: None. IMPRESSION: Findings suspicious for acute subarachnoid hemorrhage in the high left frontal lobe. No definite underlying mass lesion. Further evaluation with MRI of the brain is recommended. Critical Value/emergent results were called by  telephone at the time of interpretation on  04/06/2021 at 6:13 pm to provider Dr Darl Householder, who verbally acknowledged these results. Electronically Signed   By: Davina Poke D.O.   On: 04/06/2021 18:14   CT Angio Neck W and/or Wo Contrast  Result Date: 04/06/2021 CLINICAL DATA:  Right arm spasms, headache, dizziness and speech changes. EXAM: CT ANGIOGRAPHY HEAD AND NECK TECHNIQUE: Multidetector CT imaging of the head and neck was performed using the standard protocol during bolus administration of intravenous contrast. Multiplanar CT image reconstructions and MIPs were obtained to evaluate the vascular anatomy. Carotid stenosis measurements (when applicable) are obtained utilizing NASCET criteria, using the distal internal carotid diameter as the denominator. CONTRAST:  64mL OMNIPAQUE IOHEXOL 350 MG/ML SOLN COMPARISON:  None. FINDINGS: CTA NECK FINDINGS SKELETON: There is no bony spinal canal stenosis. No lytic or blastic lesion. OTHER NECK: Normal pharynx, larynx and major salivary glands. No cervical lymphadenopathy. Unremarkable thyroid gland. UPPER CHEST: No pneumothorax or pleural effusion. No nodules or masses. AORTIC ARCH: There is no calcific atherosclerosis of the aortic arch. There is no aneurysm, dissection or hemodynamically significant stenosis of the visualized portion of the aorta. Conventional 3 vessel aortic branching pattern. The visualized proximal subclavian arteries are widely patent. RIGHT CAROTID SYSTEM: Normal without aneurysm, dissection or stenosis. LEFT CAROTID SYSTEM: Normal without aneurysm, dissection or stenosis. VERTEBRAL ARTERIES: Left dominant configuration. Both origins are clearly patent. There is no dissection, occlusion or flow-limiting stenosis to the skull base (V1-V3 segments). CTA HEAD FINDINGS POSTERIOR CIRCULATION: --Vertebral arteries: Normal V4 segments. --Inferior cerebellar arteries: Normal. --Basilar artery: Normal. --Superior cerebellar arteries: Normal. --Posterior cerebral arteries (PCA): Normal. ANTERIOR  CIRCULATION: --Intracranial internal carotid arteries: Normal. --Anterior cerebral arteries (ACA): Normal. Both A1 segments are present. Patent anterior communicating artery (a-comm). --Middle cerebral arteries (MCA): Normal. VENOUS SINUSES: As permitted by contrast timing, patent. ANATOMIC VARIANTS: None Review of the MIP images confirms the above findings. IMPRESSION: Normal CTA of the head and neck. Electronically Signed   By: Ulyses Jarred M.D.   On: 04/06/2021 19:28   MR Brain W and Wo Contrast  Result Date: 04/06/2021 CLINICAL DATA:  Transient ischemic attack.  Intracranial hemorrhage EXAM: MRI HEAD WITHOUT AND WITH CONTRAST TECHNIQUE: Multiplanar, multiecho pulse sequences of the brain and surrounding structures were obtained without and with intravenous contrast. CONTRAST:  8.20mL GADAVIST GADOBUTROL 1 MMOL/ML IV SOLN COMPARISON:  None. FINDINGS: Brain: There is no acute infarct. There is hyperintensity on the FLAIR sequence at the anterior left frontal lobe and anterior right parietal lobe. These areas show mild contrast enhancement, particularly the left frontal areas. No midline shift or other mass effect. There is multifocal periventricular white matter hyperintensity, most often a result of chronic microvascular ischemia. Vascular: Major flow voids are preserved. Skull and upper cervical spine: Normal calvarium and skull base. Visualized upper cervical spine and soft tissues are normal. Sinuses/Orbits:No paranasal sinus fluid levels or advanced mucosal thickening. No mastoid or middle ear effusion. Normal orbits. IMPRESSION: 1. Areas of subarachnoid/pial contrast enhancement over both convexities, left more than right. Reactive enhancement in the setting of subarachnoid hemorrhage is favored. Other possibilities include meningitis and subacute ischemia. 2. Findings of chronic microvascular ischemia. Electronically Signed   By: Ulyses Jarred M.D.   On: 04/06/2021 21:11   EEG adult  Result Date:  04/09/2021 Lora Havens, MD     04/09/2021  9:07 AM Patient Name: VICTORIA EUCEDA MRN: [id number] Epilepsy Attending: Lora Havens Referring Physician/Provider: Dr Lesleigh Noe Date: 04/08/2021  Duration: 27.29 mins Patient history: 60 year old male with an episode of right arm stiffening and jerking.  EEG to evaluate for seizures. Level of alertness: Awake, drowsy AEDs during EEG study: Keppra, Depakote Technical aspects: This EEG study was done with scalp electrodes positioned according to the 10-20 International system of electrode placement. Electrical activity was acquired at a sampling rate of 500Hz  and reviewed with a high frequency filter of 70Hz  and a low frequency filter of 1Hz . EEG data were recorded continuously and digitally stored. Description: The posterior dominant rhythm consists of 8-9 Hz activity of moderate voltage (25-35 uV) seen predominantly in posterior head regions, symmetric and reactive to eye opening and eye closing. Drowsiness was characterized by attenuation of the posterior background rhythm and roving eye movements. EEG showed intermittent 2 to 3 Hz delta slowing in left hemisphere, maximal left temporal region. Hyperventilation and photic stimulation were not performed.   ABNORMALITY - Intermittent slow, left hemisphere, maximal left temporal region IMPRESSION: This study is suggestive of nonspecific cortical dysfunction in left hemisphere, maximal left temporal region. No seizures or epileptiform discharges were seen throughout the recording. Priyanka Barbra Sarks   CT CHEST ABDOMEN PELVIS WO CONTRAST  Result Date: 04/07/2021 CLINICAL DATA:  Cancer of unknown primary, staging Brain lesion. EXAM: CT CHEST, ABDOMEN AND PELVIS WITHOUT CONTRAST TECHNIQUE: Multidetector CT imaging of the chest, abdomen and pelvis was performed following the standard protocol without IV contrast. COMPARISON:  Remote abdominopelvic CT 03/05/2006 FINDINGS: CT CHEST FINDINGS Cardiovascular: Mild aortic  atherosclerosis without aneurysm. Normal heart size. No pericardial effusion. Mediastinum/Nodes: Moderate-sized hiatal hernia with irregular wall thickening of the herniated stomach. No proximal esophageal dilatation. No visualized thyroid nodule. No enlarged mediastinal lymph nodes. Limited hilar assessment on this noncontrast exam. Lungs/Pleura: Mild biapical pleuroparenchymal scarring. No acute or focal airspace disease. Occasional areas of fissural thickening of the left inter lobar fissure without discrete pulmonary nodule. There is no pulmonary mass. Trachea and central bronchi are patent. No pleural fluid. Musculoskeletal: No focal bone lesion or acute osseous abnormality. No chest wall soft tissue abnormality. CT ABDOMEN PELVIS FINDINGS Hepatobiliary: Assessment for liver lesions is limited on this noncontrast exam. Allowing for this, no focal hepatic abnormality is seen. Small gallstone within the gallbladder without pericholecystic inflammation or biliary dilatation. Pancreas: No ductal dilatation or inflammation. No evidence of pancreatic mass on this noncontrast exam. Spleen: Normal in size without focal abnormality. Adrenals/Urinary Tract: Normal adrenal glands. There is excreted IV contrast in both renal collecting systems from prior head and neck CTA. 3.8 cm simple cyst in the upper left kidney. 2.8 cm simple cyst in the anterior mid left kidney. No evidence of solid renal lesion. Excreted IV contrast in the urinary bladder, no bladder wall thickening. Stomach/Bowel: Lack of enteric contrast limits detailed assessment. Small to moderate hiatal hernia with wall thickening of the herniated stomach. Ingested material within the stomach. There is a duodenal diverticulum without acute inflammation. No small bowel obstruction or inflammation. No obvious small bowel lesion. Normal appendix. Multifocal colonic diverticulosis. No evidence of diverticulitis. Moderate colonic stool burden. No obvious colonic  mass on this unenhanced exam. There is mild mural hypertrophy of the sigmoid. Vascular/Lymphatic: Aortic and branch atherosclerosis. No aortic aneurysm. Small periportal and retroperitoneal lymph nodes are not enlarged by size criteria. Reproductive: Enlarged prostate gland spanning 5.8 cm. Other: No ascites. No free air. No evidence of omental disease. Tiny fat containing umbilical hernia. No subcutaneous soft tissue abnormality. Musculoskeletal: Postsurgical change in both proximal femoral with ghost tracks  from decompression for femoral head avascular necrosis. K-wires remain in place. No focal bone lesion. IMPRESSION: 1. Moderate-sized hiatal hernia with irregular wall thickening of the herniated stomach. Endoscopy recommended to exclude neoplasm. 2. No other evidence of primary malignancy or metastatic disease in the chest, abdomen, or pelvis on this noncontrast exam. 3. Colonic diverticulosis without diverticulitis. 4. Enlarged prostate gland. 5. Cholelithiasis without gallbladder inflammation. Aortic Atherosclerosis (ICD10-I70.0). Electronically Signed   By: Keith Rake M.D.   On: 04/07/2021 01:55    (Echo, Carotid, EGD, Colonoscopy, ERCP)    Subjective: Patient seen and examined.  No overnight events.  No more episodes of arm shaking or tiredness.  No more tremors.  Eager to go home.  Walking around in the hallway.   Discharge Exam: Vitals:   04/09/21 0852 04/09/21 1115  BP: 100/78 106/81  Pulse: 77 78  Resp: 20 19  Temp: 97.6 F (36.4 C) 97.7 F (36.5 C)  SpO2: 97% 99%   Vitals:   04/09/21 0029 04/09/21 0403 04/09/21 0852 04/09/21 1115  BP: 113/77 105/69 100/78 106/81  Pulse: 83 78 77 78  Resp: 18 19 20 19   Temp: 97.9 F (36.6 C) 97.9 F (36.6 C) 97.6 F (36.4 C) 97.7 F (36.5 C)  TempSrc: Oral Oral Oral Oral  SpO2: 95% 96% 97% 99%    General: Pt is alert, awake, not in acute distress He is walking around in the hallway and asking for discharge. Cardiovascular: RRR,  S1/S2 +, no rubs, no gallops Respiratory: CTA bilaterally, no wheezing, no rhonchi Abdominal: Soft, NT, ND, bowel sounds + Extremities: no edema, no cyanosis    The results of significant diagnostics from this hospitalization (including imaging, microbiology, ancillary and laboratory) are listed below for reference.     Microbiology: Recent Results (from the past 240 hour(s))  CSF culture w Gram Stain     Status: None (Preliminary result)   Collection Time: 04/06/21 11:11 PM   Specimen: CSF; Cerebrospinal Fluid  Result Value Ref Range Status   Specimen Description CSF  Final   Special Requests TUBE 2  Final   Gram Stain   Final    WBC PRESENT, PREDOMINANTLY MONONUCLEAR NO ORGANISMS SEEN CYTOSPIN SMEAR    Culture   Final    NO GROWTH 3 DAYS Performed at [facility], [place] [address], [place], [place] [address]    Report Status PENDING  Incomplete  Culture, fungus without smear     Status: None (Preliminary result)   Collection Time: 04/06/21 11:11 PM   Specimen: CSF; Cerebrospinal Fluid  Result Value Ref Range Status   Specimen Description CSF  Final   Special Requests TUBE 2  Final   Culture   Final    NO FUNGUS ISOLATED AFTER 3 DAYS Performed at [facility], [place] [address], [place], [place] [address]    Report Status PENDING  Incomplete  SARS CORONAVIRUS 2 (TAT 6-24 HRS) Nasopharyngeal Nasopharyngeal Swab     Status: None   Collection Time: 04/07/21  3:32 AM   Specimen: Nasopharyngeal Swab  Result Value Ref Range Status   SARS Coronavirus 2 NEGATIVE NEGATIVE Final    Comment: (NOTE) SARS-CoV-2 target nucleic acids are NOT DETECTED.  The SARS-CoV-2 RNA is generally detectable in upper and lower respiratory specimens during the acute phase of infection. Negative results do not preclude SARS-CoV-2 infection, do not rule out co-infections with other pathogens, and should not be used as the sole basis for treatment or other patient management  decisions. Negative results must be combined with clinical observations, patient history, and epidemiological information. The expected result is Negative.  Fact Sheet for Patients: SugarRoll.be  Fact Sheet for Healthcare Providers: https://www.woods-mathews.com/  This test is not yet approved or cleared by the [place] FDA and  has been authorized for detection and/or diagnosis of SARS-CoV-2 by FDA under an Emergency Use Authorization (EUA). This EUA will remain  in effect (meaning this test can be used) for the duration of the COVID-19 declaration under Se ction 564(b)(1) of the Act, 21 U.S.C. section 360bbb-3(b)(1), unless the authorization is terminated or revoked sooner.  Performed at [facility], [place] [address], [place], [place] [address]   MRSA PCR Screening     Status: None   Collection Time: 04/07/21  5:27 AM   Specimen: Nasal Mucosa; Nasopharyngeal  Result Value Ref Range Status   MRSA by PCR NEGATIVE NEGATIVE Final    Comment:        The GeneXpert MRSA Assay (FDA approved for NASAL specimens only), is one component of a comprehensive MRSA colonization surveillance program. It is not intended to diagnose MRSA infection nor to guide or monitor treatment for MRSA infections. Performed at [facility], [place] [address], [place], [place] [address]      Labs: BNP (last 3 results) No results for input(s): BNP in the last 8760 hours. Basic Metabolic Panel: Recent Labs  Lab 04/06/21 1810 04/06/21 1829 04/07/21 0435  NA 137 138 137  K 3.8 3.8 3.6  CL 104 102 106  CO2 26  --  25  GLUCOSE 102* 100* 124*  BUN 18 21 14   CREATININE 0.85 0.90 0.86  CALCIUM 9.3  --  8.7*   Liver Function Tests: Recent Labs  Lab 04/06/21 1810 04/06/21 2311  AST 20  --   ALT 22  --   ALKPHOS 61  --   BILITOT 0.6  --   PROT 7.2  --   ALBUMIN 4.2 4.4   No results for input(s): LIPASE, AMYLASE in the last 168  hours. No results for input(s): AMMONIA in the last 168 hours. CBC: Recent Labs  Lab 04/06/21 1810 04/06/21 1829  WBC 4.7  --   NEUTROABS 2.6  --   HGB 15.1 14.6  HCT 43.9 43.0  MCV 85.2  --   PLT 218  --    Cardiac Enzymes: No results for input(s): CKTOTAL, CKMB, CKMBINDEX, TROPONINI in the last 168 hours. BNP: Invalid input(s): POCBNP CBG: Recent Labs  Lab 04/08/21 1058 04/09/21 0631  GLUCAP 126* 115*   D-Dimer No results for input(s): DDIMER in the last 72 hours. Hgb A1c No results for input(s): HGBA1C in the last 72 hours. Lipid Profile No results for input(s): CHOL, HDL, LDLCALC, TRIG, CHOLHDL, LDLDIRECT in the last 72 hours. Thyroid function studies No results for input(s): TSH, T4TOTAL, T3FREE, THYROIDAB in the last 72 hours.  Invalid input(s): FREET3 Anemia work up No results for input(s): VITAMINB12, FOLATE, FERRITIN, TIBC, IRON, RETICCTPCT in the last 72 hours. Urinalysis No results found for: COLORURINE, APPEARANCEUR, [place], [place], [place], [place], [place], [place], PROTEINUR, UROBILINOGEN, NITRITE, LEUKOCYTESUR Sepsis Labs Invalid input(s): PROCALCITONIN,  WBC,  LACTICIDVEN Microbiology Recent Results (from the past 240 hour(s))  CSF culture w Gram Stain     Status: None (Preliminary result)   Collection Time: 04/06/21 11:11 PM   Specimen: CSF; Cerebrospinal Fluid  Result Value Ref Range Status   Specimen Description CSF  Final   Special Requests TUBE  2  Final   Gram Stain   Final    WBC PRESENT, PREDOMINANTLY MONONUCLEAR NO ORGANISMS SEEN CYTOSPIN SMEAR    Culture   Final    NO GROWTH 3 DAYS Performed at [facility], [place] [address], [place], [place] [address]    Report Status PENDING  Incomplete  Culture, fungus without smear     Status: None (Preliminary result)   Collection Time: 04/06/21 11:11 PM   Specimen: CSF; Cerebrospinal Fluid  Result Value Ref Range Status   Specimen Description CSF  Final   Special Requests TUBE  2  Final   Culture   Final    NO FUNGUS ISOLATED AFTER 3 DAYS Performed at [facility], [place] [address] [place], [place] [address]    Report Status PENDING  Incomplete  SARS CORONAVIRUS 2 (TAT 6-24 HRS) Nasopharyngeal Nasopharyngeal Swab     Status: None   Collection Time: 04/07/21  3:32 AM   Specimen: Nasopharyngeal Swab  Result Value Ref Range Status   SARS Coronavirus 2 NEGATIVE NEGATIVE Final    Comment: (NOTE) SARS-CoV-2 target nucleic acids are NOT DETECTED.  The SARS-CoV-2 RNA is generally detectable in upper and lower respiratory specimens during the acute phase of infection. Negative results do not preclude SARS-CoV-2 infection, do not rule out co-infections with other pathogens, and should not be used as the sole basis for treatment or other patient management decisions. Negative results must be combined with clinical observations, patient history, and epidemiological information. The expected result is Negative.  Fact Sheet for Patients: SugarRoll.be  Fact Sheet for Healthcare Providers: https://www.woods-mathews.com/  This test is not yet approved or cleared by the [place] FDA and  has been authorized for detection and/or diagnosis of SARS-CoV-2 by FDA under an Emergency Use Authorization (EUA). This EUA will remain  in effect (meaning this test can be used) for the duration of the COVID-19 declaration under Se ction 564(b)(1) of the Act, 21 U.S.C. section 360bbb-3(b)(1), unless the authorization is terminated or revoked sooner.  Performed at [facility], [place] [address], [place], [place] [address]   MRSA PCR Screening     Status: None   Collection Time: 04/07/21  5:27 AM   Specimen: Nasal Mucosa; Nasopharyngeal  Result Value Ref Range Status   MRSA by PCR NEGATIVE NEGATIVE Final    Comment:        The GeneXpert MRSA Assay (FDA approved for NASAL specimens only), is one component of a comprehensive  MRSA colonization surveillance program. It is not intended to diagnose MRSA infection nor to guide or monitor treatment for MRSA infections. Performed at [facility], [place] [address], [place], [place] [address]      Time coordinating discharge:  35 minutes  SIGNED:   Barb Merino, MD  [facility] 04/09/2021, 12:04 PM

## 2021-04-09 NOTE — Progress Notes (Signed)
Subjective: Feels well, no further episodes.   Exam: Vitals:   04/09/21 0403 04/09/21 0852  BP: 105/69 100/78  Pulse: 78 77  Resp: 19 20  Temp: 97.9 F (36.6 C) 97.6 F (36.4 C)  SpO2: 96% 97%   Gen: In bed, NAD Resp: non-labored breathing, no acute distress Abd: soft, nt  Neuro: MS: Awake, alert, interactive and appropriate CN: Visual field full, pupils equal round reactive Motor: He has poor effort in bilateral hips, but otherwise 5/5 Sensory: Intact to light touch  Pertinent Labs: CSF WBC 2 CSF RBC 2225 CSF protein 51 CSF glucose 64 RPR negative HIV negative CSF VDRL negative OC bands-pending IgG index - normal  Cytology-pending Rheumatoid factor-normal ACE-pending ENA- pending Jo one-pending LFTs are normal EEG negative  Cerebral angiogram-no evidence of vasculitis or underlying vascular abnormality   Impression: 60 year old with abnormal hyperdense areas on CT most consistent with small subarachnoid.  Location could be consistent with a coup-contrecoup, but no evidence of parenchymal involvement.  I think that this possibly represents a small subacute subarachnoid from his previous head bump and rather than invasive testing at this time,  I think I would rather repeat imaging to ensure resolution.  This would also potentially demonstrate if this actually represents malignancy if cytology is negative. Neurosarcoid may still be a consideration as well.   He does describe a previous episode of arm jerking and therefore it is unclear if this was related to his subarachnoid or not.  Either way, with his history of migraines, I would favor using Depakote to Keppra as this could be helpful for both.  Recommendations: 1) depakote 500mg  BID 2) follow-up above pending labs 3) he can follow-up with outpatient neurology for repeat imaging in 1 to 2 months. 4) work-up of esophageal findings per internal medicine 5) Please call with further questions or  concerns  Roland Rack, MD [facility] [phone]  If 7pm- 7am, please page neurology on call as listed in [place].

## 2021-04-09 NOTE — TOC Transition Note (Signed)
Transition of Care [facility]) - CM/SW Discharge Note   Patient Details  Name: Ralph Simpson MRN: [id number] Date of Birth: 06/11/1961  Transition of Care [facility]) CM/SW Contact:  Pollie Friar, RN Phone Number: 04/09/2021, 12:23 PM   Clinical Narrative:    Patient discharging home with self care. Pt has transportation home.   Final next level of care: Home/Self Care Barriers to Discharge: No Barriers Identified   Patient Goals and CMS Choice        Discharge Placement                       Discharge Plan and Services                                     Social Determinants of Health (SDOH) Interventions     Readmission Risk Interventions No flowsheet data found.

## 2021-04-09 NOTE — Procedures (Signed)
Patient Name: WANE MOLLETT  MRN: [id number]  Epilepsy Attending: Lora Havens  Referring Physician/Provider: Dr Lesleigh Noe Date: 04/08/2021 Duration: 27.29 mins  Patient history: 60 year old male with an episode of right arm stiffening and jerking.  EEG to evaluate for seizures.  Level of alertness: Awake, drowsy  AEDs during EEG study: Keppra, Depakote  Technical aspects: This EEG study was done with scalp electrodes positioned according to the 10-20 International system of electrode placement. Electrical activity was acquired at a sampling rate of 500Hz  and reviewed with a high frequency filter of 70Hz  and a low frequency filter of 1Hz . EEG data were recorded continuously and digitally stored.   Description: The posterior dominant rhythm consists of 8-9 Hz activity of moderate voltage (25-35 uV) seen predominantly in posterior head regions, symmetric and reactive to eye opening and eye closing. Drowsiness was characterized by attenuation of the posterior background rhythm and roving eye movements. EEG showed intermittent 2 to 3 Hz delta slowing in left hemisphere, maximal left temporal region. Hyperventilation and photic stimulation were not performed.     ABNORMALITY - Intermittent slow, left hemisphere, maximal left temporal region  IMPRESSION: This study is suggestive of nonspecific cortical dysfunction in left hemisphere, maximal left temporal region. No seizures or epileptiform discharges were seen throughout the recording.  Alanzo Lamb Barbra Sarks

## 2021-04-09 NOTE — Progress Notes (Signed)
Patient okay from GI standpoint to go home.  Made him a hospital follow-up with me on 05/17/2021 at 11 AM.  When I see him in clinic I will arrange for EGD and a screening colonoscopy (at least 3 months after [facility] and also after outpatient Neuro follow up.

## 2021-04-09 NOTE — Progress Notes (Signed)
Terrilee Files D/C'd Home per MD order.  Discussed with the patient and all questions fully answered.  VSS, Skin clean, dry and intact without evidence of skin break down, no evidence of skin tears noted. IV catheter discontinued intact. Site without signs and symptoms of complications. Dressing and pressure applied.  An After Visit Summary was printed and given to the patient. Patient received prescription.  D/c education completed with patient/family including follow up instructions, medication list, d/c activities limitations if indicated, with other d/c instructions as indicated by MD - patient able to verbalize understanding, all questions fully answered.   Patient instructed to return to ED, call 911, or call MD for any changes in condition.   Patient escorted to the elevator, and D/C home via private auto at 1224.  Dorris Carnes 04/09/2021 12:09 PM

## 2021-04-09 NOTE — Plan of Care (Signed)
  Problem: Education: Goal: Knowledge of General Education information will improve Description: Including pain rating scale, medication(s)/side effects and non-pharmacologic comfort measures Outcome: Progressing   Problem: Health Behavior/Discharge Planning: Goal: Ability to manage health-related needs will improve Outcome: Progressing   Problem: Clinical Measurements: Goal: Ability to maintain clinical measurements within normal limits will improve Outcome: Progressing Goal: Will remain free from infection Outcome: Progressing Goal: Diagnostic test results will improve Outcome: Progressing Goal: Respiratory complications will improve Outcome: Progressing Goal: Cardiovascular complication will be avoided Outcome: Progressing   Problem: Safety: Goal: Ability to remain free from injury will improve Outcome: Progressing

## 2021-04-09 NOTE — Care Management Important Message (Signed)
Patient Important Message  Patient Details  Name: Ralph Simpson MRN: [id number] Date of Birth: 12/14/1960   Medicare Important Message Given:  Yes   Patient left prior to IM delivery will mail to the patient home address.      Brentt Fread 04/09/2021, 3:34 PM

## 2021-04-10 LAB — CSF CULTURE W GRAM STAIN: Culture: NO GROWTH

## 2021-04-10 LAB — OLIGOCLONAL BANDS, CSF + SERM

## 2021-04-12 ENCOUNTER — Encounter: Payer: Self-pay | Admitting: Neurology

## 2021-04-12 ENCOUNTER — Ambulatory Visit (INDEPENDENT_AMBULATORY_CARE_PROVIDER_SITE_OTHER): Payer: Medicare Other | Admitting: Neurology

## 2021-04-12 VITALS — BP 117/73 | HR 81 | Ht 75.5 in | Wt 195.0 lb

## 2021-04-12 DIAGNOSIS — I629 Nontraumatic intracranial hemorrhage, unspecified: Secondary | ICD-10-CM | POA: Diagnosis not present

## 2021-04-12 DIAGNOSIS — G40209 Localization-related (focal) (partial) symptomatic epilepsy and epileptic syndromes with complex partial seizures, not intractable, without status epilepticus: Secondary | ICD-10-CM | POA: Diagnosis not present

## 2021-04-12 DIAGNOSIS — G43709 Chronic migraine without aura, not intractable, without status migrainosus: Secondary | ICD-10-CM | POA: Diagnosis not present

## 2021-04-12 MED ORDER — DIVALPROEX SODIUM ER 500 MG PO TB24: 1000.0000 mg | ORAL_TABLET | Freq: Every day | ORAL | 4 refills | Status: DC

## 2021-04-12 MED ORDER — ONDANSETRON 4 MG PO TBDP: 4.0000 mg | ORAL_TABLET | Freq: Three times a day (TID) | ORAL | 6 refills | Status: DC | PRN

## 2021-04-12 MED ORDER — SUMATRIPTAN SUCCINATE 50 MG PO TABS: 50.0000 mg | ORAL_TABLET | ORAL | 6 refills | Status: DC | PRN

## 2021-04-12 NOTE — Progress Notes (Signed)
Chief Complaint  Patient presents with  .  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 male   Partial seizure  MRI of the brain with without contrast on Apr 06, 2021 which showed areas of subarachnoid/PR contrast-enhancement over both convexity, left more than right, reactive enhancement in the setting of subarachnoid hemorrhage is favored,  EEG on Apr 09, 2021 showed nonspecific cortical dysfunction of left hemisphere, maximum at the left temporal region,  He is tolerating Depakote DR 500 mg twice a day, will change to ER 500 mg 2 tablets every night  More extensive laboratory evaluation including inflammatory markers, hypercoagulable panels,  His subarachnoid bleeding can be related to his frequent large dose Excedrin Migraine use,  No driving until seizure-free for 6 months  Chronic migraine headaches  Hope to improve with Depakote as preventive medication  Imitrex 50 mg as needed, may mix together with Zofran,  I have advised him to stop all over-the-counter supplement, including frequent Excedrin Migraine and Advil use  Chronic pain, poor sleep quality  Will refer him to orthopedic pain management for his chronic bilateral hip pain  DIAGNOSTIC DATA (LABS, IMAGING, TESTING) - I reviewed patient records, labs, notes, testing and imaging myself where available.  MRI of brain with without contrast Apr 06, 2021: 1. Areas of subarachnoid/pial contrast enhancement over both convexities, left more than right. Reactive enhancement in the setting of subarachnoid hemorrhage is favored. Other possibilities include meningitis and subacute ischemia. 2. Findings of chronic microvascular ischemia. Four-vessel angiogram Apr 08, 2021: Angiographically no evidence of arteriovenous malformation, dural AV fistula, aneurysms, dissections, or of  abnormal filling defects.  Venous outflow grossly intact. Predominant outflow of both cerebral hemispheres is via the right transverse sinus and the right sigmoid Sinus.   Apr 06, 2021: Normal CTA of the head and neck.  CT chest, abdomen, pelvis without contrast 1. Moderate-sized hiatal hernia with irregular wall thickening of the herniated stomach. Endoscopy recommended to exclude neoplasm. 2. No other evidence of primary malignancy or metastatic disease in the chest, abdomen, or pelvis on this noncontrast exam. 3. Colonic diverticulosis without diverticulitis. 4. Enlarged prostate gland. 5. Cholelithiasis without gallbladder inflammation.  Laboratory evaluations: Negative anti-Jo antibody, SSA, SSB, double-stranded DNA, mild positive scleroderma antibody IgG, negative angiotensin-converting enzyme, rheumatoid factor,  CSF study Apr 06, 2021: Normal IgG index, HSV 1/2 PCR, varicella-zoster virus antibody, crypto antigen, oligoclonal banding, VDRL, mild elevated total protein of 51, glucose of 64, spinal fluid pink, hazy, RBC of 2225, WBC of 2, negative RPR, HIV,  CMP was normal creatinine of 0.8, CBC was normal, hemoglobin of 15.1  COVID-19 was negative   This study is suggestive of nonspecific cortical dysfunction in left hemisphere, maximal left temporal region. No seizures or epileptiform discharges were seen throughout the recording.   HISTORICAL  Ralph Simpson is a 60 year old male, seen in request by Dr. Roland Simpson to follow-up hospital discharge on Apr 09, 2021, he is accompanied by his brother at today's visit on Apr 12, 2021  I reviewed and summarized the referring note. PMHx. He has past medical history of bilateral hip avascular necrosis, status post bilateral fibular grafting, chronic low back pain, on disability  He had long history of chronic migraine headaches, his typical migraine are severe holoacranial pounding headache with light, noise  sensitivity, it can happen 1-3 times a week, he used to take [facility] powder, now he take Advil, at least 2 doses of Excedrin Migraine with each episode of headache, he also has visual aura prior to his severe migraine headaches, he see tunnel vision, lose vision before headaches,  He reported frequent headaches over the past couple years, had his first right arm on controllable right arm jerking movement in September 2021, only last for few minutes, is associated with his severe headache,  He had similar episode in March 2022, he again presenting with severe migraine headaches, with uncontrollable right arm jerking, word finding difficulties, was brought in by his girlfriend to the emergency room, while in the waiting area, he had another episode  CT of the head showed left frontal subarachnoid hemorrhage  Which is confirmed by MRI of the brain with without contrast, area of subarachnoid/p.o. contrast enhancement over both convexities, left more than right, reactive enhancement in the setting of subarachnoid hemorrhage is favored  He had a CT angiogram of head and neck was normal  Also four-vessel angiogram on Apr 10, 2021 showed no evidence of vascular malformation  Extensive laboratory evaluations showed no significant abnormality, other than slight elevated scleroderma IgG antibody  Spinal fluid testing showed significantly elevated RBC 2225 at tube 4, slight elevated total protein of 51, with otherwise no significant abnormalities  He is discharge with Depakote DR 500 mg twice a day, overall tolerating medication, complains of some fatigue, he had long history of difficulty sleeping due to chronic hip pain, tends to take frequent naps in his chair instead of lying in bed sleep  PHYSICAL EXAM:   Vitals:   04/12/21 0900  BP: 117/73  Pulse: 81  Weight: 195 lb (88.5 kg)  Height: 6' 3.5" (1.918 m)   Not recorded     Body mass index is 24.05 kg/m.  PHYSICAL EXAMNIATION:  Gen: NAD,  conversant, well nourised, well groomed                     Cardiovascular: Regular rate rhythm, no peripheral edema, warm, nontender. Eyes: Conjunctivae clear without exudates or hemorrhage Neck: Supple, no carotid bruits. Pulmonary: Clear to auscultation bilaterally   NEUROLOGICAL EXAM:  MENTAL STATUS: Speech:    Speech is normal; fluent and spontaneous with normal comprehension.  Cognition:     Orientation to time, place and person     Normal recent and remote memory     Normal Attention span and concentration     Normal Language, naming, repeating,spontaneous speech     Fund of knowledge   CRANIAL NERVES: CN II: Visual fields are full to confrontation. Pupils are round equal and briskly reactive to light. CN III, IV, VI: extraocular movement are normal. No ptosis. CN V: Facial sensation is intact to light touch CN VII: Face is symmetric with normal eye closure  CN VIII: Hearing is normal to causal conversation. CN IX, X: Phonation is normal. CN XI: Head turning and shoulder shrug are intact  MOTOR: There is no pronator drift of out-stretched arms. Muscle bulk and tone are normal. Muscle strength is normal.  REFLEXES: Reflexes are 2+ and symmetric at the biceps, triceps, knees, and ankles. Plantar responses are flexor.  SENSORY: Intact to light touch, pinprick and vibratory sensation are intact in fingers and toes.  COORDINATION: There is no trunk or limb dysmetria noted.  GAIT/STANCE: He needs push-up to get up from seated position, antalgic, cautious  REVIEW OF SYSTEMS:  Full 14 system review of systems performed and notable only for as above All other review of systems were negative.   ALLERGIES: No Known Allergies  HOME MEDICATIONS: Current Outpatient Medications  Medication Sig Dispense Refill  . aspirin-acetaminophen-caffeine (EXCEDRIN MIGRAINE) 250-250-65 MG tablet Take by mouth every 6 (six) hours as needed for headache.    . divalproex (DEPAKOTE) 500  MG DR tablet Take 1 tablet (500 mg total) by mouth every 12 (twelve) hours. 60 tablet 2  . glucose blood (ACCU-CHEK AVIVA PLUS) test strip BLOOD SUGAR ONCE DAILY OR AS DIRECTED 100 strip 1  . ibuprofen (ADVIL) 200 MG tablet Take 200 mg by mouth every 6 (six) hours as needed.    . Multiple Vitamins-Minerals (PRESERVISION AREDS PO) Take 1 tablet by mouth in the morning and at bedtime.    . pantoprazole (PROTONIX) 40 MG tablet Take 1 tablet (40 mg total) by mouth daily. 30 tablet 11  . OVER THE COUNTER MEDICATION Take 1 capsule by mouth with breakfast, with lunch, and with evening meal. Reishi Mushroom (Patient not taking: Reported on 04/12/2021)    . OVER THE COUNTER MEDICATION Take 2 capsules by mouth with breakfast, with lunch, and with evening meal. Natural Glucocil- total blood sugar (Patient not taking: Reported on 04/12/2021)    . OVER THE COUNTER MEDICATION Take 1 tablet by mouth daily. bosweilla (Patient not taking: Reported on 04/12/2021)     No current facility-administered medications for this visit.    PAST MEDICAL HISTORY: Past Medical History:  Diagnosis Date  . Allergy   . GERD (gastroesophageal reflux disease)   . Migraine   . Seizures ([place])    x 2 03/2021    PAST SURGICAL HISTORY: Past Surgical History:  Procedure Laterality Date  . HERNIA REPAIR    . HIP SURGERY    . IR ANGIO INTRA EXTRACRAN SEL COM CAROTID INNOMINATE BILAT MOD SED  04/08/2021  . IR ANGIO VERTEBRAL SEL SUBCLAVIAN INNOMINATE BILAT MOD SED  04/08/2021  . IR US GUIDE VASC ACCESS RIGHT  04/08/2021    FAMILY HISTORY: Family History  Problem Relation Age of Onset  . Arthritis Mother   . Stroke Mother   . Diabetes Father   . Heart disease Father   . Hyperlipidemia Father   . Hypertension Father   . Kidney disease Maternal Grandfather     SOCIAL HISTORY: Social History   Socioeconomic History  . Marital status: Single    Spouse name: Not on file  . Number of children: 0  . Years of education: Not  on file  . Highest education level: 10th grade  Occupational History    Comment: NA  Tobacco Use  . Smoking status: Former Smoker    Packs/day: 1.00  . Smokeless tobacco: Current User    Types: Chew  . Tobacco comment: quit 10-15 years ago  Substance and Sexual Activity  . Alcohol use: Yes    Comment: occasional  . Drug use: No  . Sexual activity: Not on file  Other Topics Concern  . Not on file  Social History Narrative  . Not on file   Social Determinants of Health   Financial Resource Strain: Not on file  Food Insecurity: Not on file  Transportation Needs: Not on file  Physical Activity: Not on file  Stress: Not on file  Social Connections: Not on file  Intimate Partner Violence: Not on file      Marcial Pacas, M.D. Ph.D.  [facility] [address]  [place], [place] [address] Ph: [phone] Fax: [phone]  CC:  Greta Doom, MD [address] [place],  [place] [address]  Pcp, No   Total time spent reviewing the chart, obtaining history, examined patient, ordering tests, documentation, consultations and family, care coordination was70 minutes

## 2021-04-23 ENCOUNTER — Telehealth: Payer: Self-pay

## 2021-04-23 NOTE — Telephone Encounter (Signed)
Referral for pain management sent to Dr. Nelva Bush at [facility]. P: [phone].

## 2021-04-25 ENCOUNTER — Ambulatory Visit (INDEPENDENT_AMBULATORY_CARE_PROVIDER_SITE_OTHER): Payer: Medicare Other | Admitting: Neurology

## 2021-04-25 DIAGNOSIS — I629 Nontraumatic intracranial hemorrhage, unspecified: Secondary | ICD-10-CM

## 2021-04-25 DIAGNOSIS — G40209 Localization-related (focal) (partial) symptomatic epilepsy and epileptic syndromes with complex partial seizures, not intractable, without status epilepticus: Secondary | ICD-10-CM | POA: Diagnosis not present

## 2021-04-27 LAB — CULTURE, FUNGUS WITHOUT SMEAR

## 2021-04-29 NOTE — Telephone Encounter (Signed)
Received this notice from [facility]: "Rejection Reason - Patient [place] with the patient and he declined to schedule at this time. Thank you so much for the referral!"

## 2021-05-01 ENCOUNTER — Telehealth: Payer: Self-pay | Admitting: Neurology

## 2021-05-01 NOTE — Telephone Encounter (Signed)
Pt had an EEG on 6/9 and is wanting to know when he will be getting the results. Please advise.

## 2021-05-02 ENCOUNTER — Telehealth: Payer: Self-pay | Admitting: Neurology

## 2021-05-02 LAB — ANA W/REFLEX IF POSITIVE
Anti JO-1: <0.2 AI (ref 0.0–0.9)
Anti Nuclear Antibody (ANA): POSITIVE — AB
Centromere Ab Screen: <0.2 AI (ref 0.0–0.9)
Chromatin Ab SerPl-aCnc: <0.2 AI (ref 0.0–0.9)
ENA RNP Ab: <0.2 AI (ref 0.0–0.9)
ENA SM Ab Ser-aCnc: <0.2 AI (ref 0.0–0.9)
ENA SSA (RO) Ab: <0.2 AI (ref 0.0–0.9)
ENA SSB (LA) Ab: <0.2 AI (ref 0.0–0.9)
Scleroderma (Scl-70) (ENA) Antibody, IgG: 1.4 AI — ABNORMAL HIGH (ref 0.0–0.9)
dsDNA Ab: <1 IU/mL (ref 0–9)

## 2021-05-02 LAB — LYME DISEASE SEROLOGY W/REFLEX: Lyme Total Antibody EIA: NEGATIVE

## 2021-05-02 LAB — HYPERCOAGULABLE PANEL, COMPREHENSIVE
APTT: 25.7 s
AT III Act/Nor PPP Chro: 106 %
Act. Prt C Resist w/FV Defic.: 2.3 ratio
Anticardiolipin Ab, IgG: <10 [GPL'U]
Anticardiolipin Ab, IgM: 18 [MPL'U]
Beta-2 Glycoprotein I, IgA: <10 SAU
Beta-2 Glycoprotein I, IgG: <10 SGU
Beta-2 Glycoprotein I, IgM: <10 SMU
DRVVT Screen Seconds: 39 s
Factor VII Antigen**: 83 %
Factor VIII Activity: 147 %
Hexagonal Phospholipid Neutral: 13 s — ABNORMAL HIGH
Homocysteine: 6.9 umol/L
Prot C Ag Act/Nor PPP Imm: 93 %
Prot S Ag Act/Nor PPP Imm: 88 %
Protein C Ag/FVII Ag Ratio**: 1.1 ratio
Protein S Ag/FVII Ag Ratio**: 1.1 ratio

## 2021-05-02 LAB — SEDIMENTATION RATE: Sed Rate: 3 mm/hr (ref 0–30)

## 2021-05-02 LAB — C-REACTIVE PROTEIN: CRP: <1 mg/L (ref 0–10)

## 2021-05-02 NOTE — Telephone Encounter (Signed)
Please call patient, laboratory evaluation showed positive ANA, with low titer positive scleroderma IgG antibody, also slight positive low titer hexagonal phospholipid neutral,  Above findings are only slightly above normal range, it has unknown clinical significance.  Rest of extensive laboratory evaluation showed no significant abnormalities.  Keep current management, if needed, may repeat some of the test later

## 2021-05-02 NOTE — Telephone Encounter (Signed)
Left message requesting a return call.

## 2021-05-02 NOTE — Telephone Encounter (Addendum)
I spoke to the patient and notified him of the lab results. He will continue current treatment and keep his pending follow up on 07/16/21.

## 2021-05-02 NOTE — Telephone Encounter (Signed)
Pt returned phone call, would like a call back.  

## 2021-05-02 NOTE — Procedures (Signed)
   HISTORY: 60 years old male presented with possible seizure, abnormal EEG  TECHNIQUE:  This is a routine 16 channel EEG recording with one channel devoted to a limited EKG recording.  It was performed during wakefulness, drowsiness and asleep.  Hyperventilation and photic stimulation were performed as activating procedures.  There are minimum muscle and movement artifact noted.  Upon maximum arousal, posterior dominant waking rhythm consistent of rhythmic alpha range activity, with frequency of 8-9 Hz. Activities are symmetric over the bilateral posterior derivations and attenuated with eye opening.  Hyperventilation produced mild/moderate buildup with higher amplitude and the slower activities noted.  Photic stimulation did not alter the tracing.  During EEG recording, patient developed drowsiness and no deeper stage of sleep was achieved  During EEG recording, there was no epileptiform discharge noted.  EKG demonstrate sinus rhythm, with heart rate of 76 bpm  CONCLUSION: This is a  normal awake EEG.  There is no electrodiagnostic evidence of epileptiform discharge.  Marcial Pacas, M.D. Ph.D.  [facility] [place], [place] [address] Phone: [phone] Fax:      [phone]

## 2021-05-02 NOTE — Telephone Encounter (Signed)
CONCLUSION: This is a  normal awake EEG.  There is no electrodiagnostic evidence of epileptiform discharge.   Marcial Pacas, M.D. Ph.D.   I spoke to the patient and provided him with the EEG results. He will continue his current medications and keep his pending follow up on 07/16/21.

## 2021-05-17 ENCOUNTER — Encounter: Payer: Self-pay | Admitting: Nurse Practitioner

## 2021-05-17 ENCOUNTER — Ambulatory Visit (INDEPENDENT_AMBULATORY_CARE_PROVIDER_SITE_OTHER): Payer: Medicare Other | Admitting: Nurse Practitioner

## 2021-05-17 VITALS — BP 104/60 | HR 80 | Ht 75.5 in | Wt 200.0 lb

## 2021-05-17 DIAGNOSIS — Z1211 Encounter for screening for malignant neoplasm of colon: Secondary | ICD-10-CM

## 2021-05-17 DIAGNOSIS — K219 Gastro-esophageal reflux disease without esophagitis: Secondary | ICD-10-CM

## 2021-05-17 DIAGNOSIS — R9389 Abnormal findings on diagnostic imaging of other specified body structures: Secondary | ICD-10-CM | POA: Diagnosis not present

## 2021-05-17 MED ORDER — SUTAB 1479-225-188 MG PO TABS: 1.0000 | ORAL_TABLET | Freq: Once | ORAL | 0 refills | Status: AC

## 2021-05-17 NOTE — Progress Notes (Signed)
ASSESSMENT AND PLAN    # 60 yo male with abnormal stomach on CT scan  Patient has no GI symptoms -- For further evaluation patient will be scheduled for EGD to be done sometime in September ( following next Neurology evaluation late August). The risks and benefits of EGD with possible biopsies was discussed with the patient and they agree to proceed.    # Small subarachnoid hemorrhage 04/07/21 diagnosed after patient presented to the hospital with headache and RUE jerking.  Etiology of brain lesion initially unclear prompting an extensive evaluation for infectious  / inflammatory or malignant causes.  Ultimately felt to have a small subarachnoid hemorrhage and possible seizure activity -- Patient had a hospital follow-up with Neurology last month. SAH felt to possibly be related to large doses of Excedrin.  --Will contact patient's Neurologist to make sure she has no concerns about proceeding with endoscopic procedures  # Colon cancer screening.  -- Patient will be scheduled for colonoscopy to be done at the time of EGD sometime in September ( following his late May Neurology follow up). The risks and benefits of colonoscopy with possible polypectomy / biopsies were discussed and the patient agrees to proceed.   # Asymptomatic cholelithiasis  HISTORY OF PRESENT ILLNESS    Chief Complaint : Hospital follow-up  Ralph Simpson is a 61 y.o. male with a past medical history significant for migraines , avascular necrosis of bilateral hips status post fibular grafting, prediabetes, cholelithiasis , diverticulosis , subarachnoid hemorrhage , GERD. See PMH below for any additional medical problems.    Patient was hospitalized late May with seizure-like activity and abnormal hyperdense areas on head CT scan.  Initially the there was concern for infectious /inflammatory/or malignant process of the brain but ultimately Neurology felt he most likely had a small subarachnoid hemorrhage following a bump  on the head. He was eventually discharged home on anticonvulsants. During that admission an abdominal CT scan incidentally showed a  moderate-sized hiatal hernia with irregular wall thickening of the herniated stomach for which we were asked to evaluate.  Patient had no acute or chronic GI symptoms.  Our plan was for eventual outpatient EGD as well as a colonoscopy for colon cancer screening  Patient  had an outpatient follow-up with Neurology.late May  Subarachnoid hemorrhage felt to be possibly related to frequent large doses of Excedrin Migraines   INTERVAL HISTORY:  Patient is here for hospital follow-up.  He has no GI complaints except for occasional acid reflux.  He does not take any reflux medication on a regular basis.  Upon hospital discharge he was given prescription for Protonix to use as needed but he has not yet required a dose.  Patient has no family history of colon cancer, he has never had colon cancer screening   DATA REVIEWED: May 2022 Hemoglobin 14.6 Normal liver chemistries  PREVIOUS ENDOSCOPIC EVALUATIONS / PERTINENT STUDIES:    none   Past Medical History:  Diagnosis Date   Allergy    GERD (gastroesophageal reflux disease)    Migraine    Seizures ([place])    x 2 03/2021    Current Medications, Allergies, Past Surgical History, Family History and Social History were reviewed in [employer] record.   Current Outpatient Medications  Medication Sig Dispense Refill   aspirin-acetaminophen-caffeine (EXCEDRIN MIGRAINE) 250-250-65 MG tablet Take by mouth every 6 (six) hours as needed for headache.     divalproex (DEPAKOTE ER) 500 MG 24 hr tablet Take  1,000 mg by mouth daily.     glucose blood (ACCU-CHEK AVIVA PLUS) test strip BLOOD SUGAR ONCE DAILY OR AS DIRECTED 100 strip 1   ibuprofen (ADVIL) 200 MG tablet Take 200 mg by mouth every 6 (six) hours as needed.     Multiple Vitamins-Minerals (PRESERVISION AREDS PO) Take 1 tablet by mouth in the  morning and at bedtime.     ondansetron (ZOFRAN ODT) 4 MG disintegrating tablet Take 1 tablet (4 mg total) by mouth every 8 (eight) hours as needed. 20 tablet 6   OVER THE COUNTER MEDICATION Take 1 capsule by mouth with breakfast, with lunch, and with evening meal. Reishi Mushroom     OVER THE COUNTER MEDICATION Take 2 capsules by mouth with breakfast, with lunch, and with evening meal. Natural Glucocil- total blood sugar     OVER THE COUNTER MEDICATION Take 1 tablet by mouth daily. bosweilla     pantoprazole (PROTONIX) 40 MG tablet Take 1 tablet (40 mg total) by mouth daily. 30 tablet 11   SUMAtriptan (IMITREX) 50 MG tablet Take 1 tablet (50 mg total) by mouth every 2 (two) hours as needed. May repeat in 2 hours if headache persists or recurs. 12 tablet 6   No current facility-administered medications for this visit.    Review of Systems: No chest pain. No shortness of breath. No urinary complaints.   PHYSICAL EXAM :    Wt Readings from Last 3 Encounters:  05/17/21 200 lb (90.7 kg)  04/12/21 195 lb (88.5 kg)  03/05/20 190 lb (86.2 kg)    BP 104/60   Pulse 80   Ht 6' 3.5" (1.918 m)   Wt 200 lb (90.7 kg)   BMI 24.67 kg/m  Constitutional:  Pleasant male in no acute distress. Psychiatric: Normal mood and affect. Behavior is normal. EENT: Pupils normal.  Conjunctivae are normal. No scleral icterus. Neck supple.  Cardiovascular: Normal rate, regular rhythm. No edema Pulmonary/chest: Effort normal and breath sounds normal. No wheezing, rales or rhonchi. Abdominal: Soft, nondistended, nontender. Bowel sounds active throughout. There are no masses palpable. No hepatomegaly. Neurological: Alert and oriented to person place and time. Skin: Skin is warm and dry. No rashes noted.  Tye Savoy, NP  05/17/2021, 11:06 AM

## 2021-05-17 NOTE — Patient Instructions (Signed)
You have been scheduled for an endoscopy and colonoscopy. Please follow the written instructions given to you at your visit today. Please pick up your prep supplies at the pharmacy within the next 1-3 days. If you use inhalers (even only as needed), please bring them with you on the day of your procedure.  If you are age 62 or older, your body mass index should be between 23-30. Your Body mass index is 24.67 kg/m. If this is out of the aforementioned range listed, please consider follow up with your Primary Care Provider.  If you are age 76 or younger, your body mass index should be between 19-25. Your Body mass index is 24.67 kg/m. If this is out of the aformentioned range listed, please consider follow up with your Primary Care Provider.   __________________________________________________________  The [facility] providers would like to encourage you to use [facility] to communicate with providers for non-urgent requests or questions.  Due to long hold times on the telephone, sending your provider a message by [facility] may be a faster and more efficient way to get a response.  Please allow 48 business hours for a response.  Please remember that this is for non-urgent requests.

## 2021-05-21 NOTE — Progress Notes (Signed)
____________________________________________________________  Attending physician addendum:  Thank you for sending this case to me. I have reviewed the entire note and agree with the plan.  I recall seeing him during the hospitalization.  Wilfrid Lund, MD  ____________________________________________________________

## 2021-05-27 ENCOUNTER — Telehealth: Payer: Self-pay | Admitting: *Deleted

## 2021-05-27 NOTE — Telephone Encounter (Addendum)
Dear Dr. Krista Blue:  This patient has been scheduled for an endoscopy/colonoscopy in the near future, under IV anesthesia (Propofol).   Please FAX a note of MEDICAL CLEARANCE to [phone] to ATTN:  Shatavia Santor.   Please call to advise if this patient will require an office visit or further medical work-up before clearance can be given:  [phone] (main number) and leave a message.  Thank you   Caryl Asp, [facility]

## 2021-05-28 ENCOUNTER — Telehealth: Payer: Self-pay | Admitting: Neurology

## 2021-05-28 NOTE — Telephone Encounter (Signed)
May 28, 2021   [place] [id number]   To [facility]:  Mr. Ralph Simpson, Ralph Simpson is under care for chronic migraine headaches, analgesic overuse, partial seizures.  Hospital admission in May 2022 for prolonged severe headaches, and also recurrent partial seizure, MRI of the brain with without contrast showed subarachnoid hemorrhage, left worse than right, with contrast enhancement, which is confirmed by spinal tap.  He is not taking Depakote, tolerating it well, there was no recurrent seizure.   It is Ok to proceed with elective endoscopy/colonoscopy.  Please make sure he continue his epileptic medications peri- procedure.    If you have any questions or concerns, please don't hesitate to call.  Sincerely,    Marcial Pacas M.D. Ph.D.

## 2021-05-28 NOTE — Telephone Encounter (Signed)
  To [facility]:  Mr. Ralph Simpson, Ralph Simpson is under care for chronic migraine headaches, analgesic overuse, partial seizures.  Hospital admission in May 2022 for prolonged severe headaches, and also recurrent partial seizure, MRI of the brain with without contrast showed subarachnoid hemorrhage, left worse than right, with contrast enhancement, which is confirmed by spinal tap.  He is not taking Depakote, tolerating it well, there was no recurrent seizure.   Please make sure he continue his epileptic medications peri- procedure.    If you have any questions or concerns, please don't hesitate to call.  Sincerely,    Marcial Pacas M.D. Ph.D.

## 2021-06-03 ENCOUNTER — Telehealth: Payer: Self-pay | Admitting: Neurology

## 2021-06-03 MED ORDER — LAMOTRIGINE 100 MG PO TABS: 100.0000 mg | ORAL_TABLET | Freq: Two times a day (BID) | ORAL | 5 refills | Status: DC

## 2021-06-03 MED ORDER — LAMOTRIGINE 25 MG PO TABS: ORAL_TABLET | ORAL | 0 refills | Status: DC

## 2021-06-03 NOTE — Telephone Encounter (Signed)
Pt has had two seizures in the last 4 days, has some questions about them. Pt requesting a call back.

## 2021-06-03 NOTE — Telephone Encounter (Signed)
I spoke to the patient and his wife. Reports breakthrough seizures on 05/22/21 and 06/02/21. He is currently taking divalproex ER 500g, two tablets at bedtime. Denies any missed doses of medication.  Per vo by Dr. Krista Blue, add lamotrigine 25mg  with the following instructions (one tab BID x one week, two tabs BID x one week, three tabs BID x one week). Thereafter, lamotrigine 100mg , one tab BID. Two separate prescriptions have been sent to the pharmacy.  He is aware of potential side effects, especially to be on the lookout for any development of rash. In this case, he understands to stop the medication, drink lots of water and call our office.

## 2021-06-03 NOTE — Addendum Note (Signed)
Addended by: Noberto Retort C on: 06/03/2021 05:12 PM   Modules accepted: Orders

## 2021-06-03 NOTE — Telephone Encounter (Signed)
Pt returned phone call, would like a call back.  

## 2021-06-03 NOTE — Telephone Encounter (Signed)
Left message for patient to call office.  

## 2021-06-03 NOTE — Telephone Encounter (Signed)
LVM for patient to return call. 

## 2021-06-07 NOTE — Telephone Encounter (Signed)
Left message for patient to call office.  

## 2021-06-17 NOTE — Telephone Encounter (Signed)
Left message for patient to return my call.

## 2021-07-03 NOTE — Telephone Encounter (Signed)
Left message for patient to call office.  

## 2021-07-08 NOTE — Telephone Encounter (Signed)
Patient returned call. Best contact number [phone]

## 2021-07-08 NOTE — Telephone Encounter (Signed)
Patient informed he is cleared for his upcoming procedure by neurology.

## 2021-07-16 ENCOUNTER — Other Ambulatory Visit: Payer: Self-pay

## 2021-07-16 ENCOUNTER — Telehealth: Payer: Self-pay

## 2021-07-16 ENCOUNTER — Telehealth: Payer: Self-pay | Admitting: Neurology

## 2021-07-16 ENCOUNTER — Encounter: Payer: Self-pay | Admitting: Neurology

## 2021-07-16 ENCOUNTER — Ambulatory Visit (INDEPENDENT_AMBULATORY_CARE_PROVIDER_SITE_OTHER): Payer: Medicare Other | Admitting: Neurology

## 2021-07-16 VITALS — BP 101/67 | HR 77 | Ht 75.5 in | Wt 200.5 lb

## 2021-07-16 DIAGNOSIS — I629 Nontraumatic intracranial hemorrhage, unspecified: Secondary | ICD-10-CM

## 2021-07-16 DIAGNOSIS — G40209 Localization-related (focal) (partial) symptomatic epilepsy and epileptic syndromes with complex partial seizures, not intractable, without status epilepticus: Secondary | ICD-10-CM | POA: Diagnosis not present

## 2021-07-16 DIAGNOSIS — G43709 Chronic migraine without aura, not intractable, without status migrainosus: Secondary | ICD-10-CM | POA: Diagnosis not present

## 2021-07-16 MED ORDER — NURTEC 75 MG PO TBDP: ORAL_TABLET | ORAL | 11 refills | Status: DC

## 2021-07-16 MED ORDER — ONDANSETRON 4 MG PO TBDP: 4.0000 mg | ORAL_TABLET | Freq: Three times a day (TID) | ORAL | 6 refills | Status: DC | PRN

## 2021-07-16 MED ORDER — LAMOTRIGINE 100 MG PO TABS: 100.0000 mg | ORAL_TABLET | Freq: Two times a day (BID) | ORAL | 11 refills | Status: DC

## 2021-07-16 NOTE — Telephone Encounter (Signed)
Approved.  This approval authorizes your coverage from 11/17/2020 - 07/16/2022

## 2021-07-16 NOTE — Telephone Encounter (Signed)
PA request for Nurtec '75mg'$  submitted on CMM, Key: [id number].   Awaiting determination from [place].

## 2021-07-16 NOTE — Patient Instructions (Signed)
Lamotrigine '25mg'$  tablet  1 tab bid x one week 2 tab bid x 2nd week 3 tab bid x 3rd week     Then Lamotrigine '100mg'$  twice a day  Depakote ER '500mg'$  2 tablets every night.   Meds ordered this encounter  Medications   ondansetron (ZOFRAN ODT) 4 MG disintegrating tablet    Sig: Take 1 tablet (4 mg total) by mouth every 8 (eight) hours as needed for nausea or vomiting.    Dispense:  20 tablet    Refill:  6   Rimegepant Sulfate (NURTEC) 75 MG TBDP    Sig: Take 1 tab at onset of migraine.  May repeat in 2 hrs, if needed.  Max dose: 2 tabs/day. This is a 30 day prescription.    Dispense:  12 tablet    Refill:  11

## 2021-07-16 NOTE — Telephone Encounter (Signed)
medicare/medicaid order sent to GI. [facility] they will reach out to the patient to schedule.

## 2021-07-16 NOTE — Progress Notes (Signed)
Chief Complaint  Patient presents with   Follow-up    New room w/ significant other, Ralph Simpson. Reported having two breakthrough seizures on 05/22/21 and 06/02/21. Lamotrigine was added after a phone call to our office. He never started the medication due to concerns of safety. He has continued taking generic Depakote ER, 531m, two tablets at bedtime. No further events since that time. He understands no driving until six months of being seizure free.      ASSESSMENT AND PLAN  Ralph KENTNERis a 60y.o. male   Subarachnoid hemorrhage Partial seizure  MRI of the brain with without contrast on Apr 06, 2021 which showed areas of subarachnoid/PR contrast-enhancement over both convexity, left more than right, reactive enhancement in the setting of subarachnoid hemorrhage is favored,  EEG on Apr 09, 2021 showed nonspecific cortical dysfunction of left hemisphere, maximum at the left temporal region,  While taking Depakote ER 500 mg 2 tablets every night, he had recurrent seizure on July 6 and July 12,  His subarachnoid bleeding can be related to his frequent large dose Excedrin Migraine, BC powder use,  Repeat MRI of the brain with without contrast check Depakote level,  Add on lamotrigine titrating to 100 mg twice a day  Chronic migraine headaches  Some improvement taking Depakote every day, still have recurrent migraine about once a week  Imitrex 50 mg as needed helps some,   Will try Nurtec,may mix together with Zofran,   DIAGNOSTIC DATA (LABS, IMAGING, TESTING) - I reviewed patient records, labs, notes, testing and imaging myself where available.  MRI of brain with without contrast Apr 06, 2021: 1. Areas of subarachnoid/pial contrast enhancement over both convexities, left more than right. Reactive enhancement in the setting of subarachnoid hemorrhage is favored. Other possibilities include meningitis and subacute ischemia. 2. Findings of chronic microvascular ischemia. Four-vessel  angiogram Apr 08, 2021: Angiographically no evidence of arteriovenous malformation, dural AV fistula, aneurysms, dissections, or of abnormal filling defects.   Venous outflow grossly intact. Predominant outflow of both cerebral hemispheres is via the right transverse sinus and the right sigmoid Sinus.   Apr 06, 2021: Normal CTA of the head and neck.  CT chest, abdomen, pelvis without contrast 1. Moderate-sized hiatal hernia with irregular wall thickening of the herniated stomach. Endoscopy recommended to exclude neoplasm. 2. No other evidence of primary malignancy or metastatic disease in the chest, abdomen, or pelvis on this noncontrast exam. 3. Colonic diverticulosis without diverticulitis. 4. Enlarged prostate gland. 5. Cholelithiasis without gallbladder inflammation.   Laboratory evaluations: Negative anti-Jo antibody, SSA, SSB, double-stranded DNA, mild positive scleroderma antibody IgG, negative angiotensin-converting enzyme, rheumatoid factor,  CSF study Apr 06, 2021: Normal IgG index, HSV 1/2 PCR, varicella-zoster virus antibody, crypto antigen, oligoclonal banding, VDRL, mild elevated total protein of 51, glucose of 64, spinal fluid pink, hazy, RBC of 2225, WBC of 2, negative RPR, HIV,  CMP was normal creatinine of 0.8, CBC was normal, hemoglobin of 15.1  COVID-19 was negative  EEG This study is suggestive of nonspecific cortical dysfunction in left hemisphere, maximal left temporal region. No seizures or epileptiform discharges were seen throughout the recording.    HISTORICAL  Ralph TURNBOUGHis a 60year old male, seen in request by Dr. KRoland Rackto follow-up hospital discharge on Apr 09, 2021, he is accompanied by his brother at today's visit on Apr 12, 2021  I reviewed and summarized the referring note. PMHx. He has past medical history of bilateral hip avascular necrosis, status post  bilateral fibular grafting, chronic low back pain, on disability  He  had long history of chronic migraine headaches, his typical migraine are severe holoacranial pounding headache with light, noise sensitivity, it can happen 1-3 times a week, he used to take [facility] powder, now he take Advil, at least 2 doses of Excedrin Migraine with each episode of headache, he also has visual aura prior to his severe migraine headaches, he see tunnel vision, lose vision before headaches,  He reported frequent headaches over the past couple years, had his first right arm on controllable right arm jerking movement in September 2021, only last for few minutes, is associated with his severe headache,  He had similar episode in March 2022, he again presenting with severe migraine headaches, with uncontrollable right arm jerking, word finding difficulties, was brought in by his girlfriend to the emergency room, while in the waiting area, he had another episode  CT of the head showed left frontal subarachnoid hemorrhage  Which is confirmed by MRI of the brain with without contrast, area of subarachnoid/p.o. contrast enhancement over both convexities, left more than right, reactive enhancement in the setting of subarachnoid hemorrhage is favored  He had a CT angiogram of head and neck was normal  Also four-vessel angiogram on Apr 10, 2021 showed no evidence of vascular malformation  Extensive laboratory evaluations showed no significant abnormality, other than slight elevated scleroderma IgG antibody  Spinal fluid testing showed significantly elevated RBC 2225 at tube 4, slight elevated total protein of 51, with otherwise no significant abnormalities  He is discharge with Depakote DR 500 mg twice a day, overall tolerating medication, complains of some fatigue, he had long history of difficulty sleeping due to chronic hip pain, tends to take frequent naps in his chair instead of lying in bed sleep  UPDATE July 16 2021: He had recurrent partial seizure on July 6 and July 12, transient  right arm shaking, lasting for few minutes, no dysarthria no confusion, he has some side effect with Depakote ER 500 mg 2 tablets every night, complains of dizziness, but still taking it,  Gait abnormality due to hip pain, pending upper and lower GI study  Extensive laboratory evaluation in May 2022 showed normal or negative ESR C-reactive protein ANA hypercoagulable panel, Lyme titer, low titer scleroderma antibody, angiotension enzyme, rheumatoid factor,  PHYSICAL EXAM:   Vitals:   07/16/21 0730  BP: 101/67  Pulse: 77  Weight: 200 lb 8 oz (90.9 kg)  Height: 6' 3.5" (1.918 m)   Not recorded     Body mass index is 24.73 kg/m.  PHYSICAL EXAMNIATION:  Gen: NAD, conversant, well nourised, well groomed                     Cardiovascular: Regular rate rhythm, no peripheral edema, warm, nontender. Eyes: Conjunctivae clear without exudates or hemorrhage Neck: Supple, no carotid bruits. Pulmonary: Clear to auscultation bilaterally   NEUROLOGICAL EXAM:  MENTAL STATUS: Speech:    Speech is normal; fluent and spontaneous with normal comprehension.  Cognition:     Orientation to time, place and person     Normal recent and remote memory     Normal Attention span and concentration     Normal Language, naming, repeating,spontaneous speech     Fund of knowledge   CRANIAL NERVES: CN II: Visual fields are full to confrontation. Pupils are round equal and briskly reactive to light. CN III, IV, VI: extraocular movement are normal. No ptosis. CN V: Facial  sensation is intact to light touch CN VII: Face is symmetric with normal eye closure  CN VIII: Hearing is normal to causal conversation. CN IX, X: Phonation is normal. CN XI: Head turning and shoulder shrug are intact  MOTOR: There is no pronator drift of out-stretched arms. Muscle bulk and tone are normal. Muscle strength is normal.  REFLEXES: Reflexes are 2+ and symmetric at the biceps, triceps, knees, and ankles. Plantar  responses are flexor.  SENSORY: Intact to light touch, pinprick and vibratory sensation are intact in fingers and toes.  COORDINATION: There is no trunk or limb dysmetria noted.  GAIT/STANCE: He needs push-up to get up from seated position, antalgic, cautious  REVIEW OF SYSTEMS:  Full 14 system review of systems performed and notable only for as above All other review of systems were negative.   ALLERGIES: No Known Allergies  HOME MEDICATIONS: Current Outpatient Medications  Medication Sig Dispense Refill   divalproex (DEPAKOTE ER) 500 MG 24 hr tablet Take 1,000 mg by mouth daily.     SUMAtriptan (IMITREX) 50 MG tablet Take 1 tablet (50 mg total) by mouth every 2 (two) hours as needed. May repeat in 2 hours if headache persists or recurs. 12 tablet 6   No current facility-administered medications for this visit.    PAST MEDICAL HISTORY: Past Medical History:  Diagnosis Date   Allergy    GERD (gastroesophageal reflux disease)    Migraine    Seizures ([place])    x 2 03/2021    PAST SURGICAL HISTORY: Past Surgical History:  Procedure Laterality Date   HERNIA REPAIR     HIP SURGERY     IR ANGIO INTRA EXTRACRAN SEL COM CAROTID INNOMINATE BILAT MOD SED  04/08/2021   IR ANGIO VERTEBRAL SEL SUBCLAVIAN INNOMINATE BILAT MOD SED  04/08/2021   IR US GUIDE VASC ACCESS RIGHT  04/08/2021    FAMILY HISTORY: Family History  Problem Relation Age of Onset   Arthritis Mother    Stroke Mother    Diabetes Father    Heart disease Father    Hyperlipidemia Father    Hypertension Father    Kidney disease Maternal Grandfather     SOCIAL HISTORY: Social History   Socioeconomic History   Marital status: Single    Spouse name: Not on file   Number of children: 0   Years of education: Not on file   Highest education level: 10th grade  Occupational History    Comment: NA  Tobacco Use   Smoking status: Former    Packs/day: 1.00    Types: Cigarettes   Smokeless tobacco: Current     Types: Chew   Tobacco comments:    quit 10-15 years ago  Substance and Sexual Activity   Alcohol use: Yes    Comment: occasional   Drug use: No   Sexual activity: Not on file  Other Topics Concern   Not on file  Social History Narrative   Not on file   Social Determinants of Health   Financial Resource Strain: Not on file  Food Insecurity: Not on file  Transportation Needs: Not on file  Physical Activity: Not on file  Stress: Not on file  Social Connections: Not on file  Intimate Partner Violence: Not on file      Marcial Pacas, M.D. Ph.D.  [facility] [address] [place], [place] [address] Ph: [phone] Fax: [phone]  CC:  No referring provider defined for this encounter.  Pcp, No  Total time spent reviewing the chart, obtaining history, examined patient, ordering tests, documentation, consultations and family, care coordination was70 minutes

## 2021-07-17 ENCOUNTER — Telehealth: Payer: Self-pay | Admitting: Neurology

## 2021-07-17 LAB — CBC WITH DIFFERENTIAL
Basophils Absolute: 0 10*3/uL (ref 0.0–0.2)
Basos: 1 %
EOS (ABSOLUTE): 0.1 10*3/uL (ref 0.0–0.4)
Eos: 2 %
Hematocrit: 44.2 % (ref 37.5–51.0)
Hemoglobin: 15 g/dL (ref 13.0–17.7)
Immature Grans (Abs): 0 10*3/uL (ref 0.0–0.1)
Immature Granulocytes: 0 %
Lymphocytes Absolute: 1.2 10*3/uL (ref 0.7–3.1)
Lymphs: 36 %
MCH: 29.2 pg (ref 26.6–33.0)
MCHC: 33.9 g/dL (ref 31.5–35.7)
MCV: 86 fL (ref 79–97)
Monocytes Absolute: 0.4 10*3/uL (ref 0.1–0.9)
Monocytes: 13 %
Neutrophils Absolute: 1.6 10*3/uL (ref 1.4–7.0)
Neutrophils: 48 %
RBC: 5.13 x10E6/uL (ref 4.14–5.80)
RDW: 12.8 % (ref 11.6–15.4)
WBC: 3.3 10*3/uL — ABNORMAL LOW (ref 3.4–10.8)

## 2021-07-17 LAB — COMPREHENSIVE METABOLIC PANEL
ALT: 19 IU/L (ref 0–44)
AST: 13 IU/L (ref 0–40)
Albumin/Globulin Ratio: 2 (ref 1.2–2.2)
Albumin: 4.7 g/dL (ref 3.8–4.8)
Alkaline Phosphatase: 66 IU/L (ref 44–121)
BUN/Creatinine Ratio: 20 (ref 10–24)
BUN: 20 mg/dL (ref 8–27)
Bilirubin Total: 0.3 mg/dL (ref 0.0–1.2)
CO2: 26 mmol/L (ref 20–29)
Calcium: 9.4 mg/dL (ref 8.6–10.2)
Chloride: 102 mmol/L (ref 96–106)
Creatinine, Ser: 0.99 mg/dL (ref 0.76–1.27)
Globulin, Total: 2.4 g/dL (ref 1.5–4.5)
Glucose: 173 mg/dL — ABNORMAL HIGH (ref 65–99)
Potassium: 4.5 mmol/L (ref 3.5–5.2)
Sodium: 141 mmol/L (ref 134–144)
Total Protein: 7.1 g/dL (ref 6.0–8.5)
eGFR: 86 mL/min/{1.73_m2} (ref >59)

## 2021-07-17 LAB — VALPROIC ACID LEVEL: Valproic Acid Lvl: 61 ug/mL (ref 50–100)

## 2021-07-17 NOTE — Telephone Encounter (Addendum)
Please call patient, laboratory evaluation showed mildly decreased WBC, normal hemoglobin of 15, valproic acid level of 61, mild elevated glucose 173, blood sample was taken without fasting

## 2021-07-17 NOTE — Telephone Encounter (Signed)
LVM for patient to return call. 

## 2021-07-17 NOTE — Telephone Encounter (Signed)
I spoke to the patient. He verbalized understanding of the lab findings.

## 2021-07-29 ENCOUNTER — Other Ambulatory Visit: Payer: Self-pay

## 2021-07-29 ENCOUNTER — Ambulatory Visit (AMBULATORY_SURGERY_CENTER): Payer: Medicare Other | Admitting: Gastroenterology

## 2021-07-29 ENCOUNTER — Encounter: Payer: Self-pay | Admitting: Gastroenterology

## 2021-07-29 VITALS — BP 113/73 | HR 69 | Temp 97.8°F | Resp 12 | Ht 75.0 in | Wt 200.0 lb

## 2021-07-29 DIAGNOSIS — Z1211 Encounter for screening for malignant neoplasm of colon: Secondary | ICD-10-CM

## 2021-07-29 DIAGNOSIS — K2990 Gastroduodenitis, unspecified, without bleeding: Secondary | ICD-10-CM

## 2021-07-29 DIAGNOSIS — K319 Disease of stomach and duodenum, unspecified: Secondary | ICD-10-CM | POA: Diagnosis not present

## 2021-07-29 DIAGNOSIS — R933 Abnormal findings on diagnostic imaging of other parts of digestive tract: Secondary | ICD-10-CM

## 2021-07-29 DIAGNOSIS — K449 Diaphragmatic hernia without obstruction or gangrene: Secondary | ICD-10-CM

## 2021-07-29 DIAGNOSIS — K2970 Gastritis, unspecified, without bleeding: Secondary | ICD-10-CM

## 2021-07-29 DIAGNOSIS — D122 Benign neoplasm of ascending colon: Secondary | ICD-10-CM

## 2021-07-29 MED ORDER — SODIUM CHLORIDE 0.9 % IV SOLN: 500.0000 mL | Freq: Once | INTRAVENOUS | Status: DC

## 2021-07-29 MED ORDER — PANTOPRAZOLE SODIUM 40 MG PO TBEC: 40.0000 mg | DELAYED_RELEASE_TABLET | Freq: Every day | ORAL | 0 refills | Status: DC

## 2021-07-29 NOTE — Progress Notes (Signed)
Aditi Rovira CRNA relieves [employer]

## 2021-07-29 NOTE — Op Note (Signed)
[place] Patient Name: Ralph Simpson Procedure Date: 07/29/2021 11:27 AM MRN: [id number] Endoscopist: Mallie Mussel L. [name] , MD Age: 60 Referring MD:  Date of Birth: 06-Jan-1961 Gender: Male Account #: [id number] Procedure:                Upper GI endoscopy Indications:              Abnormal CT of the GI tract (hiatal hernia with                            gastric wall thickening seen incidentally)                           Patient reports occasional reflux symptoms and                            denies dysphagia (05/17/21 office note with details) Medicines:                Monitored Anesthesia Care Procedure:                Pre-Anesthesia Assessment:                           - Prior to the procedure, a History and Physical                            was performed, and patient medications and                            allergies were reviewed. The patient's tolerance of                            previous anesthesia was also reviewed. The risks                            and benefits of the procedure and the sedation                            options and risks were discussed with the patient.                            All questions were answered, and informed consent                            was obtained. Prior Anticoagulants: The patient has                            taken no previous anticoagulant or antiplatelet                            agents. ASA Grade Assessment: III - A patient with                            severe systemic disease. After reviewing the risks  and benefits, the patient was deemed in                            satisfactory condition to undergo the procedure.                           After obtaining informed consent, the endoscope was                            passed under direct vision. Throughout the                            procedure, the patient's blood pressure, pulse, and                            oxygen saturations  were monitored continuously. The                            Endoscope was introduced through the mouth, and                            advanced to the second part of duodenum. The upper                            GI endoscopy was accomplished without difficulty.                            The patient tolerated the procedure well. Scope In: Scope Out: Findings:                 A 3 cm hiatal hernia was present. (39-42 cm from                            incisors). There was mild distal esophageal                            tortuosity due to the hernia.                           There is no endoscopic evidence of Barrett's                            esophagus, esophagitis or stricture in the entire                            esophagus.                           Diffuse inflammation characterized by adherent                            blood, congestion (edema), erosions and erythema                            was found in the gastric body and in  the gastric                            antrum. Several biopsies were obtained on the                            greater curvature of the gastric body, on the                            lesser curvature of the gastric body, on the                            greater curvature of the gastric antrum and on the                            lesser curvature of the gastric antrum with cold                            forceps for histology (rule out H. pylori).                           The exam of the stomach was otherwise normal.                           The examined duodenum was normal. Complications:            No immediate complications. Estimated Blood Loss:     Estimated blood loss was minimal. Impression:               - 3 cm hiatal hernia.                           - Gastritis.                           - Normal examined duodenum.                           - Several biopsies were obtained on the greater                            curvature of the  gastric body, on the lesser                            curvature of the gastric body, on the greater                            curvature of the gastric antrum and on the lesser                            curvature of the gastric antrum. Recommendation:           - Patient has a contact number available for  emergencies. The signs and symptoms of potential                            delayed complications were discussed with the                            patient. Return to normal activities tomorrow.                            Written discharge instructions were provided to the                            patient.                           - Resume previous diet.                           - No aspirin, ibuprofen, naproxen, or other                            non-steroidal anti-inflammatory drugs. (history of                            exedrin use)                           - Await pathology results.                           - Use Protonix (pantoprazole) 40 mg PO daily.                            Disp#30, RF zero. If aspirin or NSAIDs use                            continues, patient should continue taking this                            medicine at least every other day to decrease the                            chance of developing an ulcer. Eletha Culbertson L. [name], MD 07/29/2021 12:25:23 PM This report has been signed electronically.

## 2021-07-29 NOTE — Patient Instructions (Addendum)
Handouts on polyps, diverticulosis, gastritis, and hiatal hernia given. Await pathology results. Repeat colonoscopy for surveillance will be determined based off of pathology results.  NO aspirin, ibuprofen, naproxen, or other non-steroidal anti-inflammatory drugs (NSAIDS).  Use protonix (pantoprazole) 40 mg PO daily for 30 days. (If aspirin or NSAIDS use continues, patient should continue taking this medicine at least every other day to decrease the chance of developing an ulcer.)   YOU HAD AN ENDOSCOPIC PROCEDURE TODAY AT [facility]:   Refer to the procedure report that was given to you for any specific questions about what was found during the examination.  If the procedure report does not answer your questions, please call your gastroenterologist to clarify.  If you requested that your care partner not be given the details of your procedure findings, then the procedure report has been included in a sealed envelope for you to review at your convenience later.  YOU SHOULD EXPECT: Some feelings of bloating in the abdomen. Passage of more gas than usual.  Walking can help get rid of the air that was put into your GI tract during the procedure and reduce the bloating. If you had a lower endoscopy (such as a colonoscopy or flexible sigmoidoscopy) you may notice spotting of blood in your stool or on the toilet paper. If you underwent a bowel prep for your procedure, you may not have a normal bowel movement for a few days.  Please Note:  You might notice some irritation and congestion in your nose or some drainage.  This is from the oxygen used during your procedure.  There is no need for concern and it should clear up in a day or so.  SYMPTOMS TO REPORT IMMEDIATELY:  Following lower endoscopy (colonoscopy or flexible sigmoidoscopy):  Excessive amounts of blood in the stool  Significant tenderness or worsening of abdominal pains  Swelling of the abdomen that is new, acute  Fever of  100F or higher  Following upper endoscopy (EGD)  Vomiting of blood or coffee ground material  New chest pain or pain under the shoulder blades  Painful or persistently difficult swallowing  New shortness of breath  Fever of 100F or higher  Black, tarry-looking stools  For urgent or emergent issues, a gastroenterologist can be reached at any hour by calling [phone]. Do not use MyChart messaging for urgent concerns.    DIET:  We do recommend a small meal at first, but then you may proceed to your regular diet.  Drink plenty of fluids but you should avoid alcoholic beverages for 24 hours.  ACTIVITY:  You should plan to take it easy for the rest of today and you should NOT DRIVE or use heavy machinery until tomorrow (because of the sedation medicines used during the test).    FOLLOW UP: Our staff will call the number listed on your records 48-72 hours following your procedure to check on you and address any questions or concerns that you may have regarding the information given to you following your procedure. If we do not reach you, we will leave a message.  We will attempt to reach you two times.  During this call, we will ask if you have developed any symptoms of COVID 19. If you develop any symptoms (ie: fever, flu-like symptoms, shortness of breath, cough etc.) before then, please call [phone].  If you test positive for Covid 19 in the 2 weeks post procedure, please call and report this information to [place].  If any biopsies were taken you will be contacted by phone or by letter within the next 1-3 weeks.  Please call us at [phone] if you have not heard about the biopsies in 3 weeks.    SIGNATURES/CONFIDENTIALITY: You and/or your care partner have signed paperwork which will be entered into your electronic medical record.  These signatures attest to the fact that that the information above on your After Visit Summary has been reviewed and is understood.  Full  responsibility of the confidentiality of this discharge information lies with you and/or your care-partner.

## 2021-07-29 NOTE — Progress Notes (Signed)
Called to room to assist during endoscopic procedure.  Patient ID and intended procedure confirmed with present staff. Received instructions for my participation in the procedure from the performing physician.  

## 2021-07-29 NOTE — Progress Notes (Signed)
Sedate, gd SR, tolerated procedure well, VSS, report to RN 

## 2021-07-29 NOTE — Progress Notes (Signed)
VS completed by CW.   Medical history reviewed and updated.

## 2021-07-29 NOTE — Progress Notes (Signed)
History and Physical:  This patient presents for endoscopic testing for: Encounter Diagnoses  Name Primary?   Colon cancer screening Yes   Abnormal finding on GI tract imaging    Clinical history in 05/17/21 office note  Patient known to me from 03/2021 hospitalization. 07/16/21 neurology office note reviewed.  Past Medical History: Past Medical History:  Diagnosis Date   Allergy    GERD (gastroesophageal reflux disease)    Migraine    Seizures ([place])    x 2 03/2021     Past Surgical History: Past Surgical History:  Procedure Laterality Date   HERNIA REPAIR     HIP SURGERY     IR ANGIO INTRA EXTRACRAN SEL COM CAROTID INNOMINATE BILAT MOD SED  04/08/2021   IR ANGIO VERTEBRAL SEL SUBCLAVIAN INNOMINATE BILAT MOD SED  04/08/2021   IR US GUIDE VASC ACCESS RIGHT  04/08/2021    Allergies: No Known Allergies  Outpatient Meds: Current Outpatient Medications  Medication Sig Dispense Refill   divalproex (DEPAKOTE ER) 500 MG 24 hr tablet Take 1,000 mg by mouth daily.     lamoTRIgine (LAMICTAL) 100 MG tablet Take 1 tablet (100 mg total) by mouth 2 (two) times daily. 60 tablet 11   ondansetron (ZOFRAN ODT) 4 MG disintegrating tablet Take 1 tablet (4 mg total) by mouth every 8 (eight) hours as needed for nausea or vomiting. 20 tablet 6   Rimegepant Sulfate (NURTEC) 75 MG TBDP Take 1 tab at onset of migraine.  May repeat in 2 hrs, if needed.  Max dose: 2 tabs/day. This is a 30 day prescription. 12 tablet 11   SUMAtriptan (IMITREX) 50 MG tablet Take 1 tablet (50 mg total) by mouth every 2 (two) hours as needed. May repeat in 2 hours if headache persists or recurs. 12 tablet 6   Current Facility-Administered Medications  Medication Dose Route Frequency Provider Last Rate Last Admin   0.9 %  sodium chloride infusion  500 mL Intravenous Once Nelida Meuse III, MD          ___________________________________________________________________ Objective   Exam:  BP (!) 138/99   Pulse 92    Temp 97.8 F (36.6 C) (Temporal)   Ht '6\' 3"'$  (1.905 m)   Wt 200 lb (90.7 kg)   SpO2 96%   BMI 25.00 kg/m   CV: RRR without murmur, S1/S2 Resp: clear to auscultation bilaterally, normal RR and effort noted GI: soft, no tenderness, with active bowel sounds.   Assessment: Encounter Diagnoses  Name Primary?   Colon cancer screening Yes   Abnormal finding on GI tract imaging      Plan: Colonoscopy EGD   The patient is appropriate for an endoscopic procedure in the ambulatory setting.   - Wilfrid Lund, MD

## 2021-07-29 NOTE — Op Note (Signed)
[place] Patient Name: Ralph Simpson Procedure Date: 07/29/2021 11:27 AM MRN: [id number] Endoscopist: Mallie Mussel L. [name] , MD Age: 60 Referring MD:  Date of Birth: 1961/09/20 Gender: Male Account #: [id number] Procedure:                Colonoscopy Indications:              Screening for colorectal malignant neoplasm, This                            is the patient's first colonoscopy Medicines:                Monitored Anesthesia Care Procedure:                Pre-Anesthesia Assessment:                           - Prior to the procedure, a History and Physical                            was performed, and patient medications and                            allergies were reviewed. The patient's tolerance of                            previous anesthesia was also reviewed. The risks                            and benefits of the procedure and the sedation                            options and risks were discussed with the patient.                            All questions were answered, and informed consent                            was obtained. Prior Anticoagulants: The patient has                            taken no previous anticoagulant or antiplatelet                            agents. ASA Grade Assessment: III - A patient with                            severe systemic disease. After reviewing the risks                            and benefits, the patient was deemed in                            satisfactory condition to undergo the procedure.  After obtaining informed consent, the colonoscope                            was passed under direct vision. Throughout the                            procedure, the patient's blood pressure, pulse, and                            oxygen saturations were monitored continuously. The                            Olympus CF-HQ190L [id number]) Colonoscope was                            introduced through the anus  and advanced to the the                            terminal ileum, with identification of the                            appendiceal orifice and IC valve. The colonoscopy                            was performed without difficulty. The patient                            tolerated the procedure well. The quality of the                            bowel preparation was excellent. The terminal                            ileum, ileocecal valve, appendiceal orifice, and                            rectum were photographed. Scope In: 11:42:06 AM Scope Out: 11:57:25 AM Scope Withdrawal Time: 0 hours 11 minutes 33 seconds  Total Procedure Duration: 0 hours 15 minutes 19 seconds  Findings:                 The perianal and digital rectal examinations were                            normal.                           The terminal ileum appeared normal.                           Multiple diverticula were found in the left colon                            and right colon.  A 4 mm polyp was found in the ascending colon. The                            polyp was sessile. The polyp was removed with a                            cold snare. Resection and retrieval were complete.                           The exam was otherwise without abnormality on                            direct and retroflexion views. Complications:            No immediate complications. Estimated Blood Loss:     Estimated blood loss was minimal. Impression:               - The examined portion of the ileum was normal.                           - Diverticulosis in the left colon and in the right                            colon.                           - One 4 mm polyp in the ascending colon, removed                            with a cold snare. Resected and retrieved.                           - The examination was otherwise normal on direct                            and retroflexion views. Recommendation:            - Patient has a contact number available for                            emergencies. The signs and symptoms of potential                            delayed complications were discussed with the                            patient. Return to normal activities tomorrow.                            Written discharge instructions were provided to the                            patient.                           -  Resume previous diet.                           - Continue present medications.                           - Await pathology results.                           - Repeat colonoscopy is recommended for                            surveillance. The colonoscopy date will be                            determined after pathology results from today's                            exam become available for review.                           - See the other procedure note for documentation of                            additional recommendations. Aldine Grainger L. [name], MD 07/29/2021 12:12:04 PM This report has been signed electronically.

## 2021-07-31 ENCOUNTER — Telehealth: Payer: Self-pay

## 2021-07-31 NOTE — Telephone Encounter (Signed)
Attempted to reach patient for post-procedure f/u call. No answer. Left message for him to please not hesitate to call us if he has any questions/concerns regarding his care. 

## 2021-07-31 NOTE — Telephone Encounter (Signed)
Attempted to reach patient for post-procedure f/u call. NO answer. Left message that staff will make another attempt to reach him later today and for him to please not hesitate to call us if he has any questions/concerns regarding his care.

## 2021-08-04 ENCOUNTER — Other Ambulatory Visit: Payer: Self-pay

## 2021-08-04 ENCOUNTER — Encounter (HOSPITAL_BASED_OUTPATIENT_CLINIC_OR_DEPARTMENT_OTHER): Payer: Self-pay | Admitting: Emergency Medicine

## 2021-08-04 ENCOUNTER — Emergency Department (HOSPITAL_BASED_OUTPATIENT_CLINIC_OR_DEPARTMENT_OTHER)
Admission: EM | Admit: 2021-08-04 | Discharge: 2021-08-04 | Disposition: A | Discharge status: Home / Self Care | Payer: Medicare Other | Attending: Emergency Medicine | Admitting: Emergency Medicine

## 2021-08-04 ENCOUNTER — Emergency Department (HOSPITAL_BASED_OUTPATIENT_CLINIC_OR_DEPARTMENT_OTHER): Payer: Medicare Other

## 2021-08-04 DIAGNOSIS — M791 Myalgia, unspecified site: Secondary | ICD-10-CM | POA: Diagnosis not present

## 2021-08-04 DIAGNOSIS — R519 Headache, unspecified: Secondary | ICD-10-CM | POA: Insufficient documentation

## 2021-08-04 DIAGNOSIS — Z87891 Personal history of nicotine dependence: Secondary | ICD-10-CM | POA: Insufficient documentation

## 2021-08-04 DIAGNOSIS — Z20822 Contact with and (suspected) exposure to covid-19: Secondary | ICD-10-CM | POA: Diagnosis not present

## 2021-08-04 DIAGNOSIS — R509 Fever, unspecified: Secondary | ICD-10-CM | POA: Diagnosis not present

## 2021-08-04 DIAGNOSIS — J029 Acute pharyngitis, unspecified: Secondary | ICD-10-CM | POA: Insufficient documentation

## 2021-08-04 DIAGNOSIS — R000 Tachycardia, unspecified: Secondary | ICD-10-CM | POA: Insufficient documentation

## 2021-08-04 LAB — URINALYSIS, ROUTINE W REFLEX MICROSCOPIC
Bilirubin Urine: NEGATIVE
Glucose, UA: 250 mg/dL — AB
Hgb urine dipstick: NEGATIVE
Ketones, ur: 15 mg/dL — AB
Leukocytes,Ua: NEGATIVE
Nitrite: NEGATIVE
Protein, ur: NEGATIVE mg/dL
Specific Gravity, Urine: 1.02 (ref 1.005–1.030)
pH: 6 (ref 5.0–8.0)

## 2021-08-04 LAB — RESP PANEL BY RT-PCR (FLU A&B, COVID) ARPGX2
Influenza A by PCR: NEGATIVE
Influenza B by PCR: NEGATIVE
SARS Coronavirus 2 by RT PCR: NEGATIVE

## 2021-08-04 IMAGING — DX DG CHEST 1V
1 series · 2 of 2 positions shown · non-contrast
Comparison: None.

CLINICAL DATA: Fever and congestion

EXAM:
CHEST  1 VIEW

[Series 1: chest · 0.14mm/px · 2 of 2 slices shown]
[im 1/2]
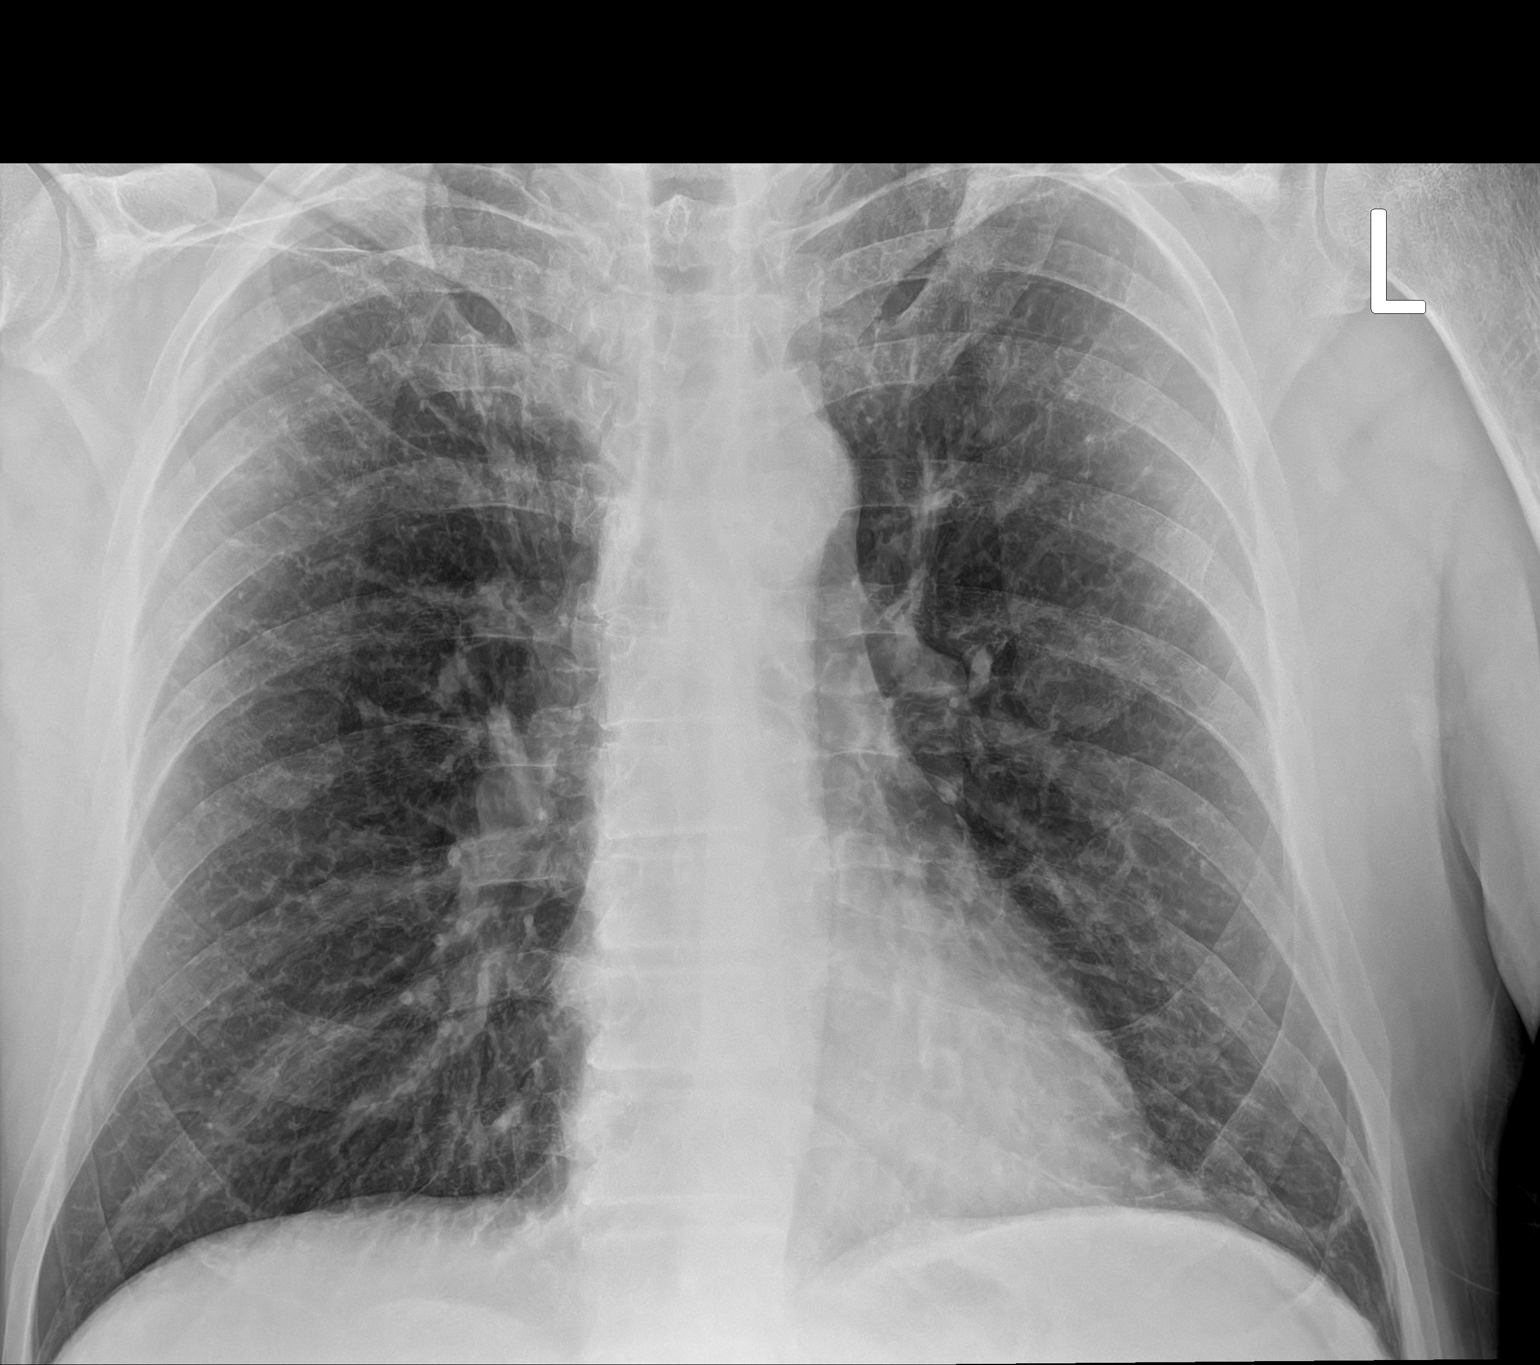
[im 2/2]
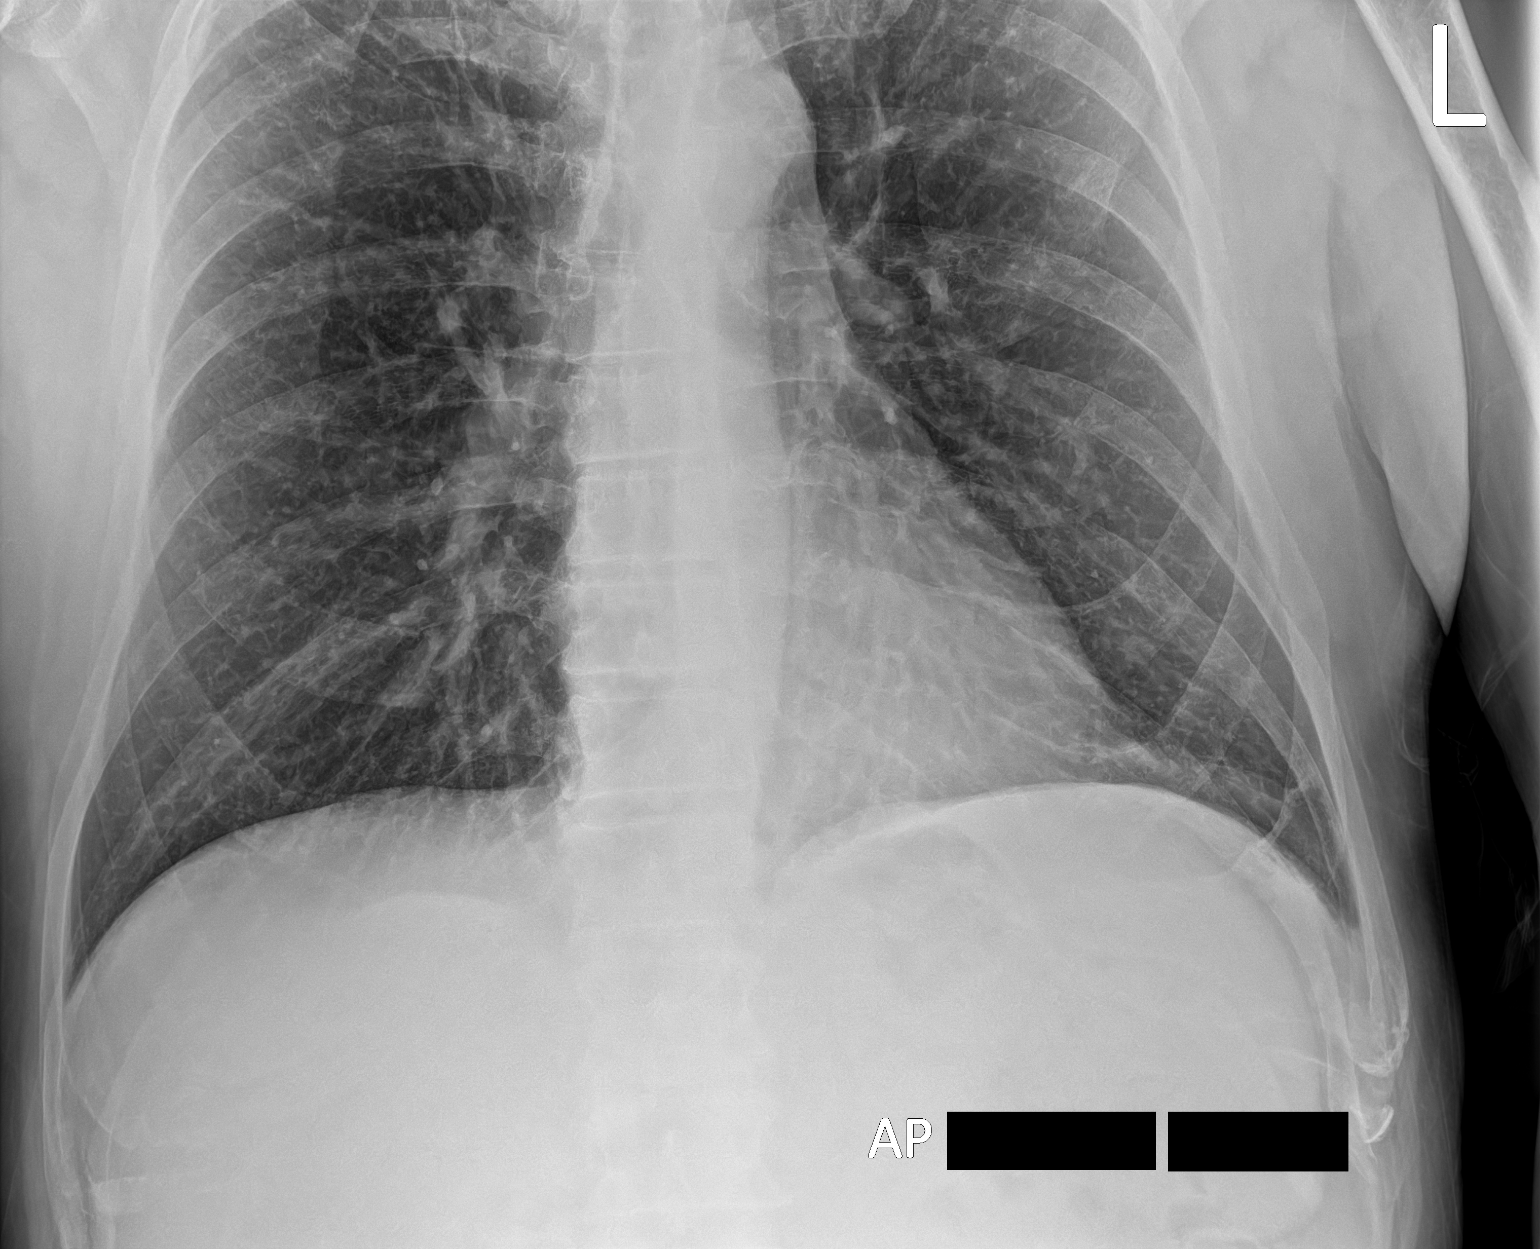

[2 of 2 positions shown; findings below may reference images not displayed]

FINDINGS: The heart size and mediastinal contours are within normal limits. No
focal pulmonary opacity. No pleural effusion or pneumothorax. No
acute osseous abnormality.
IMPRESSION: No active disease.

## 2021-08-04 MED ORDER — ACETAMINOPHEN 500 MG PO TABS
1000.0000 mg | ORAL_TABLET | Freq: Once | ORAL | Status: AC
  Administered 2021-08-04: 1000 mg via ORAL

## 2021-08-04 NOTE — ED Triage Notes (Signed)
3 days of subjective fever, fatigue and achy.

## 2021-08-04 NOTE — Discharge Instructions (Addendum)
Call your primary care doctor or specialist as discussed in the next 2-3 days.   Return immediately back to the ER if:  Your symptoms worsen within the next 12-24 hours. You develop new symptoms such as new fevers, persistent vomiting, new pain, shortness of breath, or new weakness or numbness, or if you have any other concerns.  

## 2021-08-04 NOTE — ED Provider Notes (Signed)
[facility] EMERGENCY DEPT Provider Note   CSN: [id number] Arrival date & time: 08/04/21  1834     History Chief Complaint  Patient presents with   Fever    Ralph Simpson is a 60 y.o. male.  He presented chief complaint of patient presents to ER chief complaint of fever, body aches and headache.  Symptoms have been going on for 3 days.  Also complaining of a mild sore throat.  Denies any cough denies any vomiting denies any diarrhea.  Denies any recent travel.      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 04/06/2021   GERD (gastroesophageal reflux disease) 01/27/2020   Migraines 01/27/2020   Prediabetes 01/27/2020   AVN (avascular necrosis of bone) ([place]) 02/12/2012    Past Surgical History:  Procedure Laterality Date   HERNIA REPAIR     HIP SURGERY     IR ANGIO INTRA EXTRACRAN SEL COM CAROTID INNOMINATE BILAT MOD SED  04/08/2021   IR ANGIO VERTEBRAL SEL SUBCLAVIAN INNOMINATE BILAT MOD SED  04/08/2021   IR US GUIDE VASC ACCESS RIGHT  04/08/2021       Family History  Problem Relation Age of Onset   Arthritis Mother    Stroke Mother    Diabetes Father    Heart disease Father    Hyperlipidemia Father    Hypertension Father    Kidney disease Maternal Grandfather    Colon cancer Neg Hx    Stomach cancer Neg Hx    Rectal cancer Neg Hx     Social History   Tobacco Use   Smoking status: Former    Packs/day: 1.00    Types: Cigarettes   Smokeless tobacco: Former    Types: Chew   Tobacco comments:    quit 10-15 years ago  Vaping  Use   Vaping Use: Never used  Substance Use Topics   Alcohol use: Yes    Comment: occasional   Drug use: No    Home Medications Prior to Admission medications   Medication Sig Start Date End Date Taking? Authorizing Provider  divalproex (DEPAKOTE ER) 500 MG 24 hr tablet Take 1,000 mg by mouth daily.    [provider]  lamoTRIgine (LAMICTAL) 100 MG tablet Take 1 tablet (100 mg total) by mouth 2 (two) times daily. 07/16/21   Marcial Pacas, MD  ondansetron (ZOFRAN ODT) 4 MG disintegrating tablet Take 1 tablet (4 mg total) by mouth every 8 (eight) hours as needed for nausea or vomiting. 07/16/21   Marcial Pacas, MD  pantoprazole (PROTONIX) 40 MG tablet Take 1 tablet (40 mg total) by mouth daily. 07/29/21 08/28/21  Doran Stabler, MD  Rimegepant Sulfate (NURTEC) 75 MG TBDP Take 1 tab at onset of migraine.  May repeat in 2 hrs, if needed.  Max dose: 2 tabs/day. This is a 30 day prescription. 07/16/21   Marcial Pacas, MD  SUMAtriptan (IMITREX) 50 MG tablet Take 1 tablet (50 mg total) by mouth every 2 (two) hours as needed. May repeat in 2 hours if headache persists  or recurs. 04/12/21   Marcial Pacas, MD    Allergies    Patient has no known allergies.  Review of Systems   Review of Systems  Constitutional:  Positive for fever.  HENT:  Negative for ear pain and sore throat.   Eyes:  Negative for pain.  Respiratory:  Negative for cough.   Cardiovascular:  Negative for chest pain.  Gastrointestinal:  Negative for abdominal pain.  Genitourinary:  Negative for flank pain.  Musculoskeletal:  Negative for back pain.  Skin:  Negative for color change and rash.  Neurological:  Negative for syncope.  All other systems reviewed and are negative.  Physical Exam Updated Vital Signs BP (!) 166/73 (BP Location: Right Arm)   Pulse 91   Temp 100 F (37.8 C) (Oral)   Resp 16   SpO2 95%   Physical Exam Constitutional:      Appearance: He is well-developed.  HENT:     Head: Normocephalic.      Nose: Nose normal.  Eyes:     Extraocular Movements: Extraocular movements intact.  Cardiovascular:     Rate and Rhythm: Normal rate.     Heart sounds: No murmur heard.   No friction rub.  Pulmonary:     Effort: Pulmonary effort is normal.  Musculoskeletal:     Cervical back: Normal range of motion and neck supple. No rigidity.  Lymphadenopathy:     Cervical: No cervical adenopathy.  Skin:    Coloration: Skin is not jaundiced.     Findings: No bruising, lesion or rash.  Neurological:     General: No focal deficit present.     Mental Status: He is alert and oriented to person, place, and time. Mental status is at baseline.     Cranial Nerves: No cranial nerve deficit.     Motor: No weakness.    ED Results / Procedures / Treatments   Labs (all labs ordered are listed, but only abnormal results are displayed) Labs Reviewed  URINALYSIS, ROUTINE W REFLEX MICROSCOPIC - Abnormal; Notable for the following components:      Result Value   Glucose, UA 250 (*)    Ketones, ur 15 (*)    All other components within normal limits  RESP PANEL BY RT-PCR (FLU A&B, COVID) [id number]    EKG None  Radiology DG Chest 1 View  Result Date: 08/04/2021 CLINICAL DATA:  Fever and congestion EXAM: CHEST  1 VIEW COMPARISON:  None. FINDINGS: The heart size and mediastinal contours are within normal limits. No focal pulmonary opacity. No pleural effusion or pneumothorax. No acute osseous abnormality. IMPRESSION: No active disease. Electronically Signed   By: Merilyn Baba M.D.   On: 08/04/2021 20:44    Procedures Procedures   Medications Ordered in ED Medications  acetaminophen (TYLENOL) tablet 1,000 mg (1,000 mg Oral Given 08/04/21 2025)    ED Course  I have reviewed the triage vital signs and the nursing notes.  Pertinent labs & imaging results that were available during my care of the patient were reviewed by me and considered in my medical decision making (see chart for details).    MDM  Rules/Calculators/A&P                           Patient well-appearing normal vital signs but tachycardic and febrile.  Given Tylenol with improvement.  Clinically well-appearing and has no significant complaints of any pain at this time.  Ancillary studies are all unremarkable  exact cause for the patient's febrile illness is unclear.  Endocarditis considered, however no murmurs or rubs noted, no lesions on the eyes or hands or fingernails noted.  Recommending outpatient follow-up with primary care doctor within 3 to 4 days.  Recommend immediate return for persistent fevers worsening symptoms trouble breathing or any additional concerns.  Final Clinical Impression(s) / ED Diagnoses Final diagnoses:  Febrile illness    Rx / DC Orders ED Discharge Orders     None        Luna Fuse, MD 08/04/21 2212

## 2021-08-06 ENCOUNTER — Encounter: Payer: Self-pay | Admitting: Gastroenterology

## 2021-08-07 ENCOUNTER — Ambulatory Visit: Payer: Self-pay | Admitting: *Deleted

## 2021-08-07 ENCOUNTER — Telehealth: Payer: Self-pay | Admitting: Neurology

## 2021-08-07 NOTE — Telephone Encounter (Signed)
I do not think the symptoms are related to the patient's recent endoscopic procedures.  HD

## 2021-08-07 NOTE — Telephone Encounter (Signed)
I spoke to the patient. He is currently titrating up his lamotrigine dose with 25mg  tablets. He just completed a week of lamotrigine 75mg  BID. Denies any rash. However, he developed a fever of 101-102 for the last five consecutive days. He also had  headache and body aches. Presented to the ED on 08/04/21. He is concerned this may be a side effect caused by his last increase of lamotrigine. He is unsure what else could have been the cause. So far today, his body temp has remained normal at 98. He would like to know if he should go ahead and started the 100mg  BID.

## 2021-08-07 NOTE — Telephone Encounter (Signed)
Pt reports endoscopy and colonoscopy 07/29/21. Developed fever 08/02/21 Friday. Reports fever has ranged 101-102, "Usually in afternoons." Reports fever and chills, sweating at HS. Also reports nausea, no vomiting, increased fatigue and generalized weakness. Seen in ED 08/04/21, "All negative, no UTI, covid negative."   States has been staying hydrated. Temp this AM 97 Last took tylenol 0300. States "By afternoon it will be up." States mosquito bites noted Friday. Pt has not covid tested again. Pt has not alerted GI who did procedures. NT spoke with Rachell, will route to practice for PCPs review and final disposition.  Pt aware, number verified. CB# [phone] Mobile  [phone]

## 2021-08-07 NOTE — Telephone Encounter (Signed)
Pt called states he just got out of the ED. Thinks he had a reaction to his medication. Not sure which ones made him sick. Pt requesting a call back.

## 2021-08-07 NOTE — Telephone Encounter (Signed)
I spoke to the patient. He is agreeable to continue titrating his lamotrigine dosage. He understands to closely monitor his temperature and will call us for a return of fever.

## 2021-08-07 NOTE — Telephone Encounter (Signed)
Left message requesting a return call.

## 2021-08-07 NOTE — Telephone Encounter (Signed)
Reason for Disposition  Fever present > 3 days (72 hours)  Answer Assessment - Initial Assessment Questions 1. TEMPERATURE: "What is the most recent temperature?"  "How was it measured?"      101-102 all week. 2. ONSET: "When did the fever start?"      Friday the 16th 3. CHILLS: "Do you have chills?" If yes: "How bad are they?"  (e.g., none, mild, moderate, severe)   - NONE: no chills   - MILD: feeling cold   - MODERATE: feeling very cold, some shivering (feels better under a thick blanket)   - SEVERE: feeling extremely cold with shaking chills (general body shaking, rigors; even under a thick blanket)      Chills and sweating 4. OTHER SYMPTOMS: "Do you have any other symptoms besides the fever?"  (e.g., abdomen pain, cough, diarrhea, earache, headache, sore throat, urination pain)     Nauseated no vomiting, Staying hydrated 5. CAUSE: If there are no symptoms, ask: "What do you think is causing the fever?"       6. CONTACTS: "Does anyone else in the family have an infection?"      7. TREATMENT: "What have you done so far to treat this fever?" (e.g., medications)     Tylenol ATC 8. IMMUNOCOMPROMISE: "Do you have of the following: diabetes, HIV positive, splenectomy, cancer chemotherapy, chronic steroid treatment, transplant patient, etc."       10. TRAVEL: "Have you traveled out of the country in the last month?" (e.g., travel history, exposures)       no  Protocols used: Fever-A-AH

## 2021-08-07 NOTE — Telephone Encounter (Signed)
Ok to schedule in office but needs to contact GI as well since he recently had those procedures

## 2021-08-07 NOTE — Telephone Encounter (Signed)
Patient reached me inadvertently though he was seeking the main number for [facility]  He reports that he had an upper endoscopy and colonoscopy on 07/29/2021 with Dr. [name] He 3 days ago developed a febrile illness which has persisted and he wanted to know if this could be related to his procedures.  He was seen in the ER for the same and was found to be negative for COVID as well as UTI. Fevers to 102 with body aches and chills have persisted No localizing GI symptoms  I will forward this note to Dr. [name] for his opinion The patient stated that he had also reached out to his neurologist regarding the same

## 2021-08-07 NOTE — Telephone Encounter (Signed)
The pt was notified of Rollene Fare recommendation. She verbalize understanding, no questions or  concerns.

## 2021-08-07 NOTE — Telephone Encounter (Signed)
Chart reviewed, less likely fever is related to his lamotrigine use, it is okay to continue titrating to 100 twice a day, asked him to call back clinic if he continues to have question, may consider early follow-up

## 2021-08-09 ENCOUNTER — Encounter: Payer: Self-pay | Admitting: Internal Medicine

## 2021-08-09 ENCOUNTER — Other Ambulatory Visit: Payer: Self-pay

## 2021-08-09 ENCOUNTER — Ambulatory Visit (INDEPENDENT_AMBULATORY_CARE_PROVIDER_SITE_OTHER): Payer: Medicare Other | Admitting: Internal Medicine

## 2021-08-09 VITALS — BP 101/62 | HR 102 | Temp 99.6°F | Resp 18 | Ht 75.0 in | Wt 201.0 lb

## 2021-08-09 DIAGNOSIS — R059 Cough, unspecified: Secondary | ICD-10-CM

## 2021-08-09 DIAGNOSIS — R5082 Postprocedural fever: Secondary | ICD-10-CM

## 2021-08-09 DIAGNOSIS — R61 Generalized hyperhidrosis: Secondary | ICD-10-CM

## 2021-08-09 DIAGNOSIS — I700 Atherosclerosis of aorta: Secondary | ICD-10-CM | POA: Insufficient documentation

## 2021-08-09 DIAGNOSIS — E785 Hyperlipidemia, unspecified: Secondary | ICD-10-CM | POA: Insufficient documentation

## 2021-08-09 DIAGNOSIS — R6883 Chills (without fever): Secondary | ICD-10-CM

## 2021-08-09 DIAGNOSIS — R531 Weakness: Secondary | ICD-10-CM

## 2021-08-09 DIAGNOSIS — E1169 Type 2 diabetes mellitus with other specified complication: Secondary | ICD-10-CM | POA: Insufficient documentation

## 2021-08-09 DIAGNOSIS — R52 Pain, unspecified: Secondary | ICD-10-CM

## 2021-08-09 DIAGNOSIS — D709 Neutropenia, unspecified: Secondary | ICD-10-CM | POA: Diagnosis not present

## 2021-08-09 DIAGNOSIS — R197 Diarrhea, unspecified: Secondary | ICD-10-CM | POA: Diagnosis not present

## 2021-08-09 NOTE — Patient Instructions (Signed)
Fever, Adult   A fever is an increase in the body's temperature. It is usually defined as a temperature of 100.59F (38C) or higher. Brief mild or moderate fevers generally have no long-term effects, and they often do not need treatment. Moderate or high fevers may make you feel uncomfortable and can sometimes be a sign of a serious illness or disease. The sweating that may occur with repeated or prolonged fever may also cause a loss of fluid in the body (dehydration). Fever is confirmed by taking a temperature with a thermometer. A measured temperature can vary with: Age. Time of day. Where in the body you take the temperature. Readings may vary if you place the thermometer: In the mouth (oral). In the rectum (rectal). In the ear (tympanic). Under the arm (axillary). On the forehead (temporal). Follow these instructions at home: Medicines Take over-the counter and prescription medicines only as told by your health care provider. Follow the dosing instructions carefully. If you were prescribed an antibiotic medicine, take it as told by your health care provider. Do not stop taking the antibiotic even if you start to feel better. General instructions Watch your condition for any changes. Let your health care provider know about them. Rest as needed. Drink enough fluid to keep your urine pale yellow. This helps to prevent dehydration. Sponge yourself or bathe with room-temperature water to help reduce your body temperature as needed. Do not use ice water. Do not use too many blankets or wear clothes that are too heavy. If your fever may be caused by an infection that spreads from person to person (is contagious), such as a cold or the flu, you should stay home from work and public gatherings for at least 24 hours after your fever is gone. Your fever should be gone without the need to use medicines. Contact a health care provider if: You vomit. You cannot eat or drink without vomiting. You  have diarrhea. You have pain when you urinate. Your symptoms do not improve with treatment. You develop new symptoms. You develop excessive weakness. Get help right away if: You have shortness of breath or have trouble breathing. You are dizzy or you faint. You are disoriented or confused. You develop signs of dehydration, such as: Dark urine, very little urine, or no urine. Cracked lips. Dry mouth. Sunken eyes. Sleepiness. Weakness. You develop severe pain in your abdomen. You have persistent vomiting or diarrhea. You develop a skin rash. Your symptoms suddenly get worse. Summary A fever is an increase in the body's temperature. It is usually defined as a temperature of 100.59F (38C) or higher. Moderate or high fevers can sometimes be a sign of a serious illness or disease. The sweating that may occur with repeated or prolonged fever may also cause dehydration. Pay attention to any changes in your symptoms and contact your health care provider if your symptoms do not improve with treatment. Take over-the counter and prescription medicines only as told by your health care provider. Follow the dosing instructions carefully. If your fever is from an infection that may be contagious, such as cold or flu, you should stay home from work and public gatherings for at least 24 hours after your fever is gone. Your fever should be gone without the need to use medicines. Get help right away if you develop signs of dehydration, such as dark urine, cracked lips, dry mouth, sunken eyes, sleepiness, or weakness. This information is not intended to replace advice given to you by your health  care provider. Make sure you discuss any questions you have with your health care provider. Document Revised: 04/19/2018 Document Reviewed: 04/19/2018 Elsevier Patient Education  [place].

## 2021-08-09 NOTE — Progress Notes (Signed)
Subjective:    Patient ID: Ralph Simpson, male    DOB: 1961-05-28, 60 y.o.   MRN: [id number]  HPI  Patient presents to clinic today for ER follow-up.  He went to the ER 9/18 with complaint of intermittent fevers.  Chest x-ray was negative.  Urinalysis was negative.  He tested negative for influenza and COVID.  No blood tests were obtained.  He was discharged and advised to follow-up with his PCP.  He reports he did have a colonoscopy on 9/12. 4 days later, he developed the fever, chills, body aches, and weakness. He reports last night, he developed sweating and coughing. The cough is mostly nonproductive. He has had some diarrhea but denies blood in his stool. He denies headache, runny nose, nasal congestion, ear pain, sore throat, shortness of breath, nausea, vomiting,  urinary symptoms or rash. He took Tylenol OTC with minimal relief of symptoms.  Review of Systems  Past Medical History:  Diagnosis Date   Allergy    GERD (gastroesophageal reflux disease)    Migraine    Seizures ([facility])    x 2 03/2021    Current Outpatient Medications  Medication Sig Dispense Refill   divalproex (DEPAKOTE ER) 500 MG 24 hr tablet Take 1,000 mg by mouth daily.     lamoTRIgine (LAMICTAL) 100 MG tablet Take 1 tablet (100 mg total) by mouth 2 (two) times daily. 60 tablet 11   ondansetron (ZOFRAN ODT) 4 MG disintegrating tablet Take 1 tablet (4 mg total) by mouth every 8 (eight) hours as needed for nausea or vomiting. 20 tablet 6   pantoprazole (PROTONIX) 40 MG tablet Take 1 tablet (40 mg total) by mouth daily. 30 tablet 0   Rimegepant Sulfate (NURTEC) 75 MG TBDP Take 1 tab at onset of migraine.  May repeat in 2 hrs, if needed.  Max dose: 2 tabs/day. This is a 30 day prescription. 12 tablet 11   SUMAtriptan (IMITREX) 50 MG tablet Take 1 tablet (50 mg total) by mouth every 2 (two) hours as needed. May repeat in 2 hours if headache persists or recurs. 12 tablet 6   No current facility-administered medications  for this visit.    No Known Allergies  Family History  Problem Relation Age of Onset   Arthritis Mother    Stroke Mother    Diabetes Father    Heart disease Father    Hyperlipidemia Father    Hypertension Father    Kidney disease Maternal Grandfather    Colon cancer Neg Hx    Stomach cancer Neg Hx    Rectal cancer Neg Hx     Social History   Socioeconomic History   Marital status: Single    Spouse name: Not on file   Number of children: 0   Years of education: Not on file   Highest education level: 10th grade  Occupational History    Comment: NA  Tobacco Use   Smoking status: Former    Packs/day: 1.00    Types: Cigarettes   Smokeless tobacco: Former    Types: Chew   Tobacco comments:    quit 10-15 years ago  Vaping Use   Vaping Use: Never used  Substance and Sexual Activity   Alcohol use: Yes    Comment: occasional   Drug use: No   Sexual activity: Not Currently  Other Topics Concern   Not on file  Social History Narrative   Not on file   Social Determinants of Radio broadcast assistant  Strain: Not on file  Food Insecurity: Not on file  Transportation Needs: Not on file  Physical Activity: Not on file  Stress: Not on file  Social Connections: Not on file  Intimate Partner Violence: Not on file     Constitutional: Patient reports fever, chills, body aches and fatigue.  Denies malaise, headache or abrupt weight changes.  HEENT: Denies eye pain, eye redness, ear pain, ringing in the ears, wax buildup, runny nose, nasal congestion, bloody nose, or sore throat. Respiratory: Patient reports cough.  Denies difficulty breathing, shortness of breath, or sputum production.   Cardiovascular: Denies chest pain, chest tightness, palpitations or swelling in the hands or feet.  Gastrointestinal: Patient reports diarrhea.  Denies abdominal pain, bloating, constipation, or blood in the stool.  GU: Denies urgency, frequency, pain with urination, burning sensation,  blood in urine, odor or discharge. Skin: Patient reports sweating.  Denies redness, rashes, lesions or ulcercations.    No other specific complaints in a complete review of systems (except as listed in HPI above).     Objective:   Physical Exam  BP 101/62 (BP Location: Right Arm, Patient Position: Sitting, Cuff Size: Normal)   Pulse (!) 102   Temp 99.6 F (37.6 C) (Oral)   Resp 18   Ht _0  (1.905 m)   Wt 201 lb (91.2 kg)   SpO2 98%   BMI 25.12 kg/m   Wt Readings from Last 3 Encounters:  07/29/21 200 lb (90.7 kg)  07/16/21 200 lb 8 oz (90.9 kg)  05/17/21 200 lb (90.7 kg)    General: Appears his stated age, well developed, well nourished in NAD. Skin: Warm, dry and intact. No rashes noted. HEENT: Head: normal shape and size; Eyes: sclera white and EOMs intact;  Neck: No adenopathy noted. Cardiovascular: Tachycardic with normal rhythm. S1,S2 noted.  No murmur, rubs or gallops noted.  Pulmonary/Chest: Normal effort and positive vesicular breath sounds. No respiratory distress. No wheezes, rales or ronchi noted.  Abdomen: Soft and nontender. Normal bowel sounds. No distention or masses noted.  Musculoskeletal:  No difficulty with gait.  Neurological: Alert and oriented.   BMET    Component Value Date/Time   NA 141 07/16/2021 0813   K 4.5 07/16/2021 0813   CL 102 07/16/2021 0813   CO2 26 07/16/2021 0813   GLUCOSE 173 (H) 07/16/2021 0813   GLUCOSE 124 (H) 04/07/2021 0435   BUN 20 07/16/2021 0813   CREATININE 0.99 07/16/2021 0813   CALCIUM 9.4 07/16/2021 0813   GFRNONAA >60 04/07/2021 0435   GFRAA >60 11/11/2015 1340    Lipid Panel     Component Value Date/Time   CHOL 194 03/05/2020 1003   TRIG 83.0 03/05/2020 1003   HDL 48.20 03/05/2020 1003   CHOLHDL 4 03/05/2020 1003   VLDL 16.6 03/05/2020 1003   LDLCALC 130 (H) 03/05/2020 1003    CBC    Component Value Date/Time   WBC 3.3 (L) 07/16/2021 0813   WBC 4.7 04/06/2021 1810   RBC 5.13 07/16/2021 0813   RBC  5.15 04/06/2021 1810   HGB 15.0 07/16/2021 0813   HCT 44.2 07/16/2021 0813   PLT 218 04/06/2021 1810   MCV 86 07/16/2021 0813   MCH 29.2 07/16/2021 0813   MCH 29.3 04/06/2021 1810   MCHC 33.9 07/16/2021 0813   MCHC 34.4 04/06/2021 1810   RDW 12.8 07/16/2021 0813   LYMPHSABS 1.2 07/16/2021 0813   MONOABS 0.5 04/06/2021 1810   EOSABS 0.1 07/16/2021 0813  BASOSABS 0.0 07/16/2021 0813    Hgb A1C Lab Results  Component Value Date   HGBA1C 5.9 (A) 09/04/2020            Assessment & Plan:   ER follow-up for Fever, Chills, Body Aches, Fatigue, Diarrhea, Cough and Sweating  ER notes, labs and imaging reviewed We will repeat COVID test at this time CBC with differential and c-Met today Encouraged rest and fluids Continue Tylenol OTC as needed for fever and body aches  We will follow-up after labs are back, urgent care/ER precautions discussed Webb Silversmith, NP This visit occurred during the SARS-CoV-2 public health emergency.  Safety protocols were in place, including screening questions prior to the visit, additional usage of staff PPE, and extensive cleaning of exam room while observing appropriate contact time as indicated for disinfecting solutions.

## 2021-08-10 LAB — COMPLETE METABOLIC PANEL WITH GFR
AG Ratio: 1.6 (calc) (ref 1.0–2.5)
ALT: 57 U/L — ABNORMAL HIGH (ref 9–46)
AST: 48 U/L — ABNORMAL HIGH (ref 10–35)
Albumin: 3.9 g/dL (ref 3.6–5.1)
Alkaline phosphatase (APISO): 52 U/L (ref 35–144)
BUN: 16 mg/dL (ref 7–25)
CO2: 26 mmol/L (ref 20–32)
Calcium: 8.6 mg/dL (ref 8.6–10.3)
Chloride: 102 mmol/L (ref 98–110)
Creat: 1.15 mg/dL (ref 0.70–1.35)
Globulin: 2.4 g/dL (calc) (ref 1.9–3.7)
Glucose, Bld: 147 mg/dL — ABNORMAL HIGH (ref 65–139)
Potassium: 4.5 mmol/L (ref 3.5–5.3)
Sodium: 136 mmol/L (ref 135–146)
Total Bilirubin: 0.5 mg/dL (ref 0.2–1.2)
Total Protein: 6.3 g/dL (ref 6.1–8.1)
eGFR: 72 mL/min/{1.73_m2} (ref >=60)

## 2021-08-10 LAB — CBC WITH DIFFERENTIAL/PLATELET
Absolute Monocytes: 156 cells/uL — ABNORMAL LOW (ref 200–950)
Basophils Absolute: 29 cells/uL (ref 0–200)
Basophils Relative: 1.7 %
Eosinophils Absolute: 0 cells/uL — ABNORMAL LOW (ref 15–500)
Eosinophils Relative: 0 %
HCT: 38.5 % (ref 38.5–50.0)
Hemoglobin: 13.1 g/dL — ABNORMAL LOW (ref 13.2–17.1)
Lymphs Abs: 587 cells/uL — ABNORMAL LOW (ref 850–3900)
MCH: 29 pg (ref 27.0–33.0)
MCHC: 34 g/dL (ref 32.0–36.0)
MCV: 85.4 fL (ref 80.0–100.0)
MPV: 10.3 fL (ref 7.5–12.5)
Monocytes Relative: 9.2 %
Neutro Abs: 928 cells/uL — ABNORMAL LOW (ref 1500–7800)
Neutrophils Relative %: 54.6 %
Platelets: 132 10*3/uL — ABNORMAL LOW (ref 140–400)
RBC: 4.51 10*6/uL (ref 4.20–5.80)
RDW: 12.6 % (ref 11.0–15.0)
Total Lymphocyte: 34.5 %
WBC: 1.7 10*3/uL — ABNORMAL LOW (ref 3.8–10.8)

## 2021-08-10 LAB — NOVEL CORONAVIRUS, NAA: SARS-CoV-2, NAA: NOT DETECTED

## 2021-08-10 LAB — SARS-COV-2, NAA 2 DAY TAT

## 2021-08-11 NOTE — Addendum Note (Signed)
Addended by: Jearld Fenton on: 08/11/2021 07:19 AM   Modules accepted: Orders

## 2021-08-12 ENCOUNTER — Other Ambulatory Visit: Payer: Self-pay

## 2021-08-12 DIAGNOSIS — R748 Abnormal levels of other serum enzymes: Secondary | ICD-10-CM

## 2021-08-12 DIAGNOSIS — D709 Neutropenia, unspecified: Secondary | ICD-10-CM

## 2021-08-13 LAB — HEPATITIS PANEL, ACUTE
Hep A IgM: NONREACTIVE
Hep B C IgM: NONREACTIVE
Hepatitis B Surface Ag: NONREACTIVE
Hepatitis C Ab: NONREACTIVE
SIGNAL TO CUT-OFF: 0.01 (ref <1.00)

## 2021-08-13 LAB — PATHOLOGIST SMEAR REVIEW

## 2021-08-13 LAB — MONONUCLEOSIS SCREEN: Heterophile, Mono Screen: NEGATIVE

## 2021-08-15 NOTE — Progress Notes (Signed)
Ultrasound ordered and referral to hematology placed

## 2021-08-22 ENCOUNTER — Telehealth: Payer: Self-pay | Admitting: Neurology

## 2021-08-22 NOTE — Telephone Encounter (Addendum)
I returned the call to the patient. He is being seen by his PCP for further evaluation of abnormal labs (low WBC, low platelets, elevated liver function). He will complete his work-up with his PCP. He has pending US Abdomen Limited RUQ (Liver/GB) on 08/26/21. He should continue his seizure medications, as prescribed.

## 2021-08-22 NOTE — Telephone Encounter (Signed)
Pt called,informed by PCP white blood count is low. Want to know if divalproex (DEPAKOTE ER) 500 MG 24 hr tabletand lamoTRIgine (LAMICTAL) 100 MG tablet has an effect on my white blood count

## 2021-08-26 ENCOUNTER — Ambulatory Visit
Admission: RE | Admit: 2021-08-26 | Discharge: 2021-08-26 | Disposition: A | Discharge status: Home / Self Care | Payer: Medicare Other | Source: Ambulatory Visit | Attending: Internal Medicine | Admitting: Internal Medicine

## 2021-08-26 ENCOUNTER — Other Ambulatory Visit: Payer: Self-pay

## 2021-08-26 DIAGNOSIS — R748 Abnormal levels of other serum enzymes: Secondary | ICD-10-CM | POA: Diagnosis not present

## 2021-08-26 IMAGING — US US ABDOMEN LIMITED
1 series · 14 of 25 positions shown · non-contrast
Comparison: None.

CLINICAL DATA: Elevated liver enzymes

EXAM:
ULTRASOUND ABDOMEN LIMITED RIGHT UPPER QUADRANT

[Series 1: us abdomen limited · 0.23mm/px · 14 of 50 slices shown]
[im 1/50]
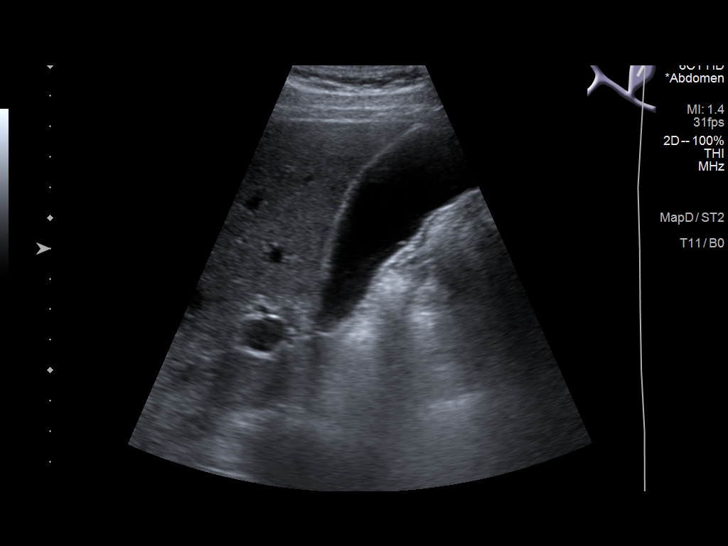
[im 5/50]
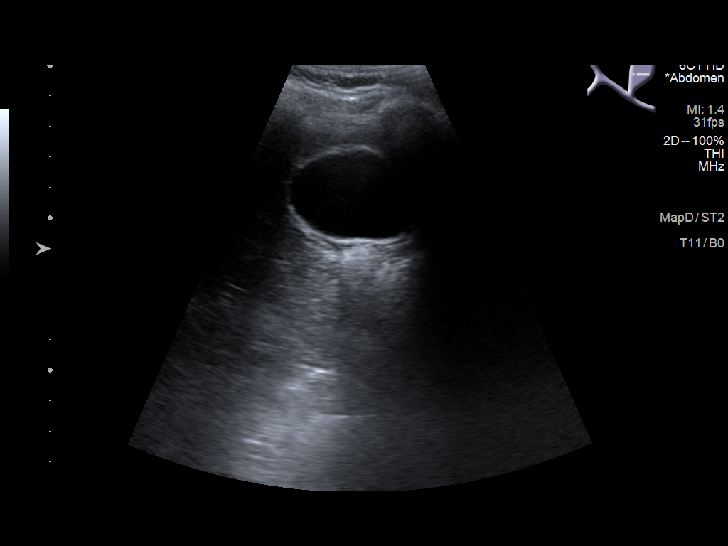
[im 9/50]
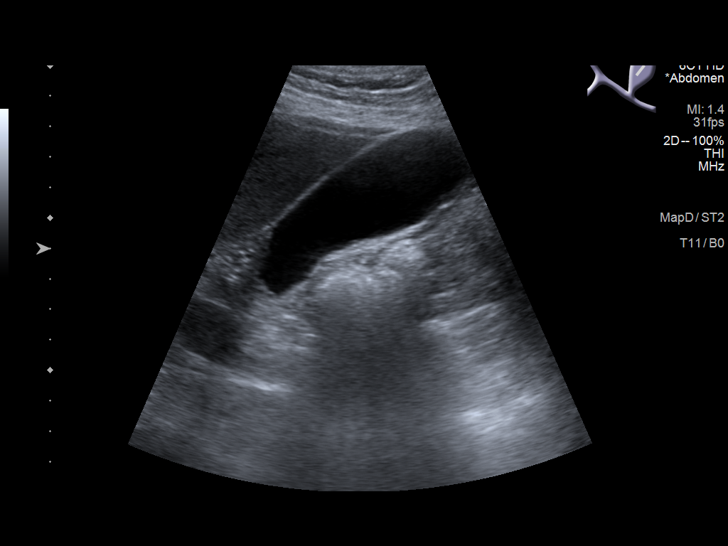
[im 13/50]
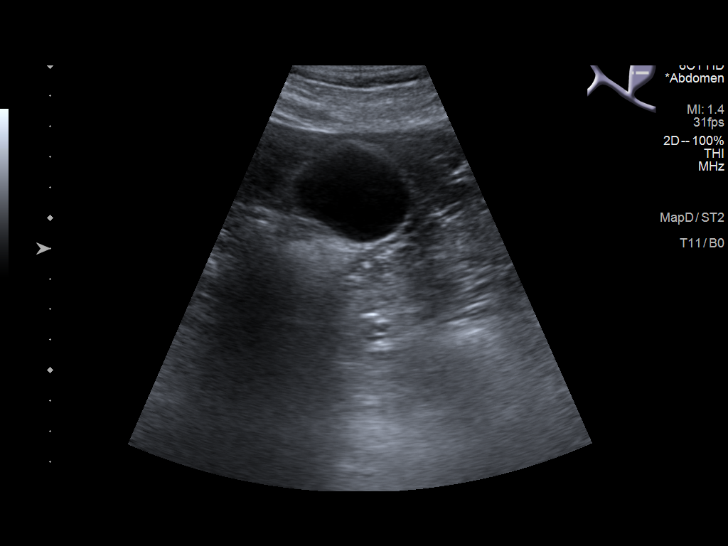
[im 17/50]
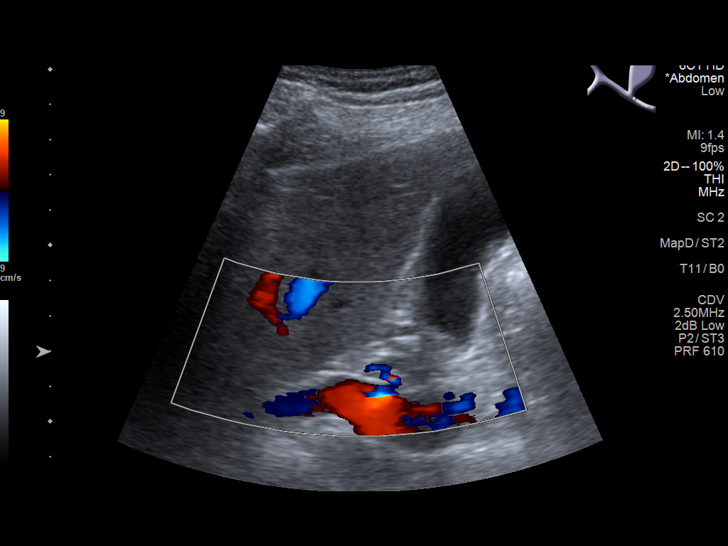
[im 19/50]
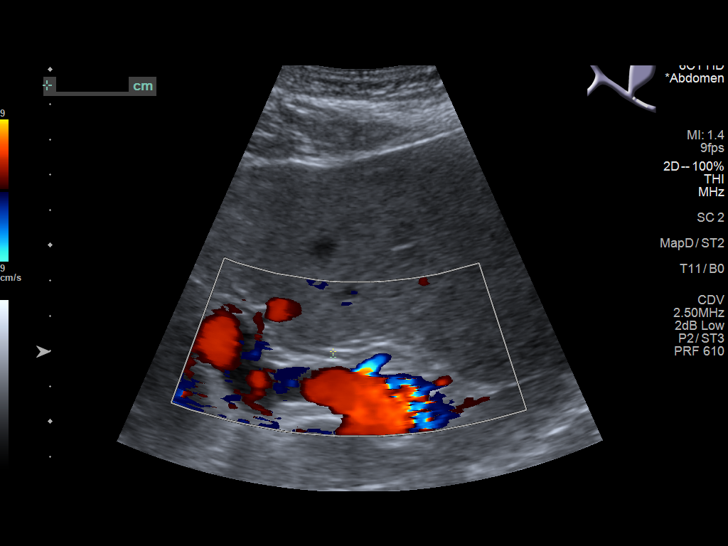
[im 23/50]
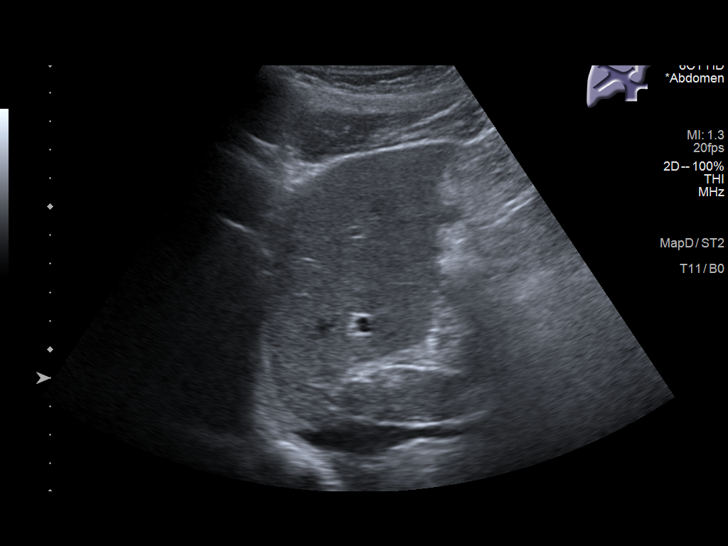
[im 27/50]
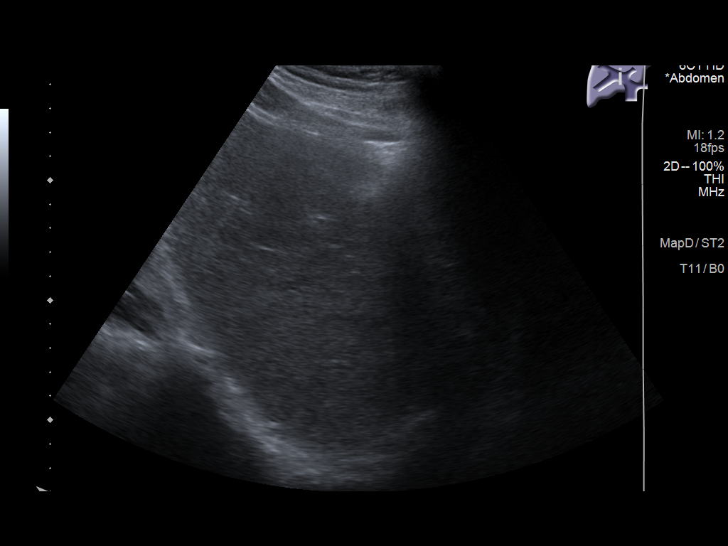
[im 31/50]
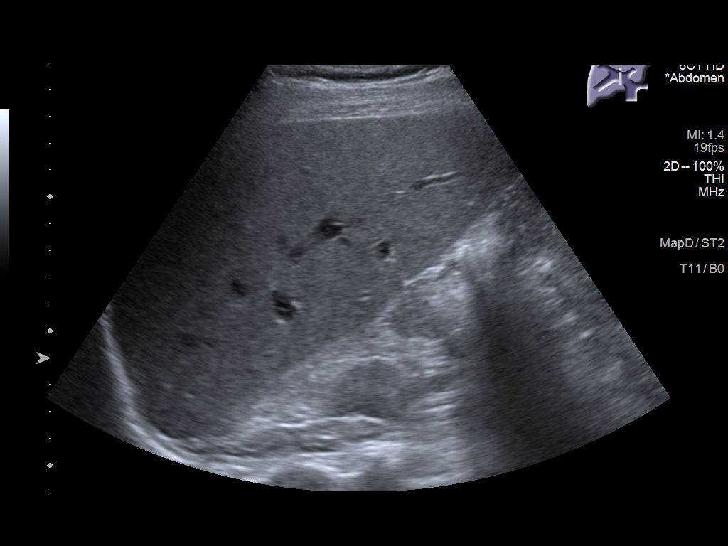
[im 33/50]
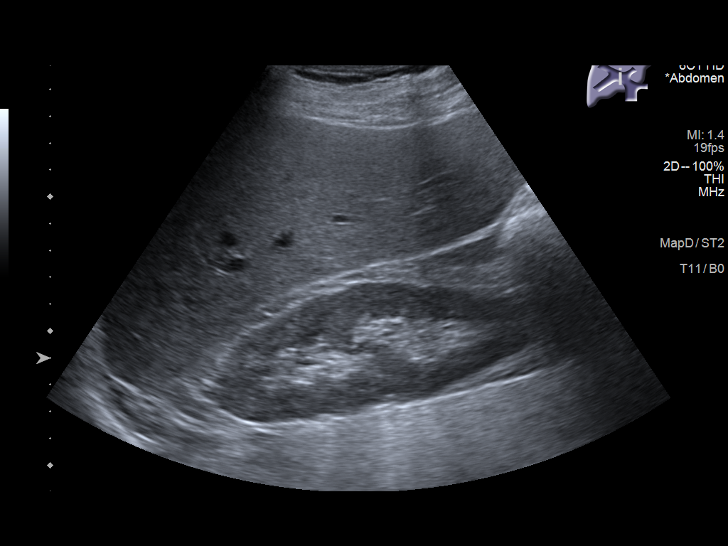
[im 37/50]
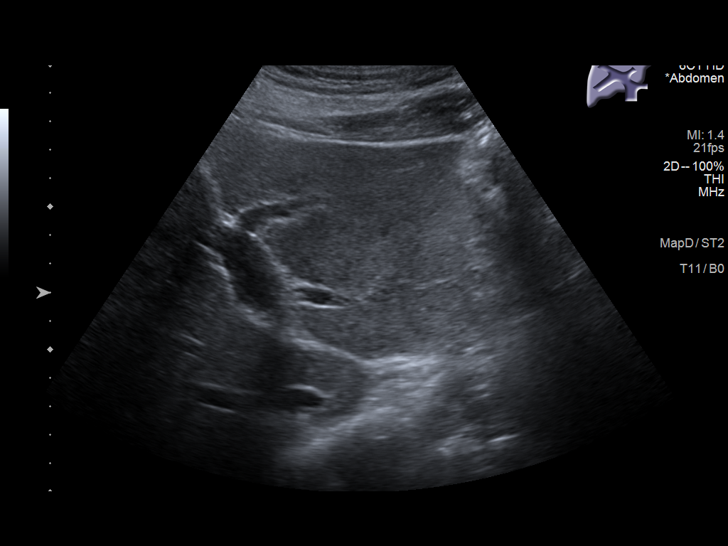
[im 41/50]
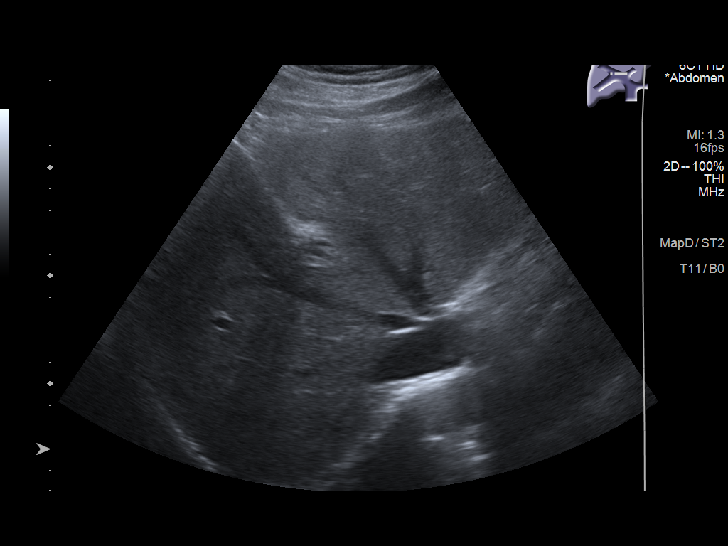
[im 45/50]
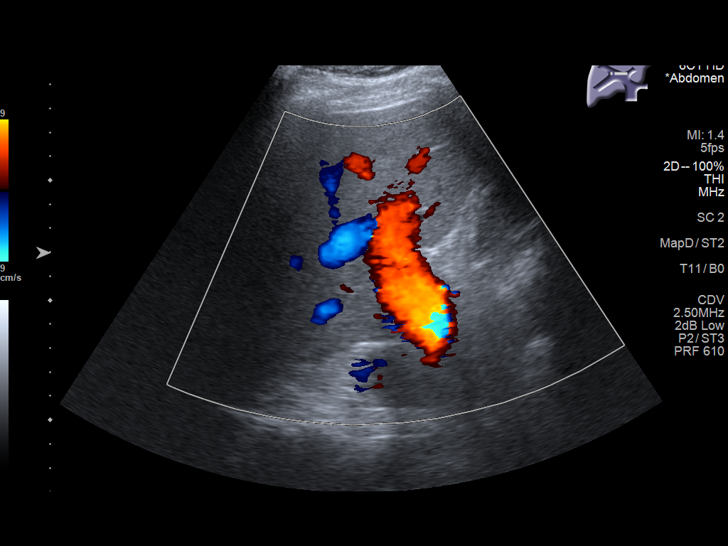
[im 50/50]
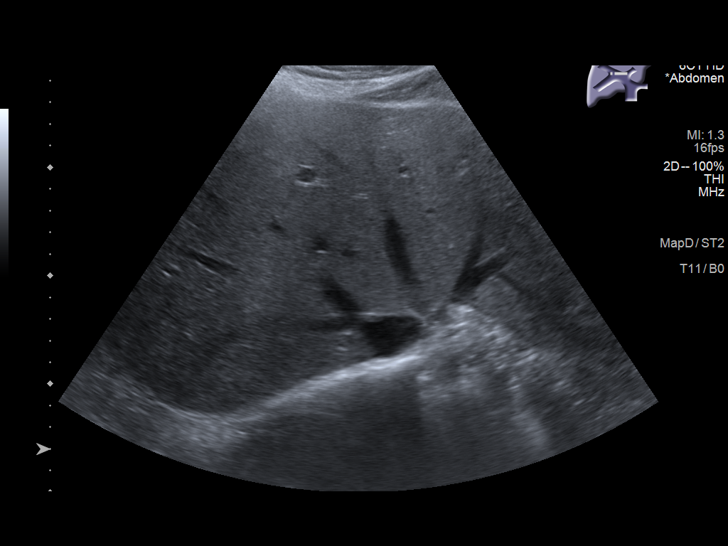

[14 of 25 positions shown; findings below may reference images not displayed]

FINDINGS: Gallbladder:

9 mm cholelithiasis. No pericholecystic fluid or gallbladder wall
thickening visualized. No sonographic Murphy sign noted by
sonographer.

Common bile duct:

Diameter: 2 mm

Liver:

No focal lesion identified. Within normal limits in parenchymal
echogenicity. Portal vein is patent on color Doppler imaging with
normal direction of blood flow towards the liver.

Other: None.
IMPRESSION: Cholelithiasis without sonographic evidence of acute cholecystitis.

## 2021-09-24 ENCOUNTER — Other Ambulatory Visit: Payer: Self-pay | Admitting: Neurology

## 2021-10-02 ENCOUNTER — Telehealth: Payer: Self-pay | Admitting: Neurology

## 2021-10-02 NOTE — Telephone Encounter (Signed)
Pt is asking if he can take Berberine along with his prescribed medications from Dr Krista Blue

## 2021-10-02 NOTE — Telephone Encounter (Signed)
Called and spoke with pt. Relayed I spoke with Dr. Krista Blue who is not familiar with berberine. Would not recommend adding since she is not sure how it is metabolized. Hesitant as she is not sure if it will affect medication he is on for seizures. He verbalized understanding.

## 2021-11-09 ENCOUNTER — Other Ambulatory Visit: Payer: Self-pay | Admitting: Neurology

## 2021-11-12 ENCOUNTER — Telehealth: Payer: Self-pay | Admitting: Neurology

## 2021-11-12 NOTE — Telephone Encounter (Signed)
Pt requesting a refill for lamoTRIgine (LAMICTAL) 100 MG tablet. Pharmacy [id number] - [place], [place]. Pt also wanting to know is it okay if he has missed some days due to the fact we were closed over the holiday.

## 2021-11-12 NOTE — Telephone Encounter (Signed)
#  60 x 11 [facility] to [facility] on 07/16/21. I called the patient who reports only missing one dose this morning. Says the pharmacy told him there were no additional refills. I called [facility] at [phone] and spoke to [place] [employer] manager). She confirmed there are refills on file and it will be ready within the hour to pick up. He will take it tonight.

## 2021-11-13 ENCOUNTER — Encounter: Payer: Self-pay | Admitting: Internal Medicine

## 2021-11-13 ENCOUNTER — Other Ambulatory Visit: Payer: Self-pay

## 2021-11-13 ENCOUNTER — Ambulatory Visit (INDEPENDENT_AMBULATORY_CARE_PROVIDER_SITE_OTHER): Payer: Medicare Other | Admitting: Internal Medicine

## 2021-11-13 VITALS — BP 96/67 | HR 74 | Temp 97.3°F | Resp 18 | Ht 75.0 in | Wt 207.2 lb

## 2021-11-13 DIAGNOSIS — I700 Atherosclerosis of aorta: Secondary | ICD-10-CM | POA: Diagnosis not present

## 2021-11-13 DIAGNOSIS — G43C1 Periodic headache syndromes in child or adult, intractable: Secondary | ICD-10-CM

## 2021-11-13 DIAGNOSIS — I951 Orthostatic hypotension: Secondary | ICD-10-CM | POA: Insufficient documentation

## 2021-11-13 DIAGNOSIS — G40209 Localization-related (focal) (partial) symptomatic epilepsy and epileptic syndromes with complex partial seizures, not intractable, without status epilepticus: Secondary | ICD-10-CM

## 2021-11-13 DIAGNOSIS — S61022A Laceration with foreign body of left thumb without damage to nail, initial encounter: Secondary | ICD-10-CM | POA: Diagnosis not present

## 2021-11-13 DIAGNOSIS — D709 Neutropenia, unspecified: Secondary | ICD-10-CM

## 2021-11-13 DIAGNOSIS — R7303 Prediabetes: Secondary | ICD-10-CM

## 2021-11-13 DIAGNOSIS — E7800 Pure hypercholesterolemia, unspecified: Secondary | ICD-10-CM

## 2021-11-13 DIAGNOSIS — Z23 Encounter for immunization: Secondary | ICD-10-CM

## 2021-11-13 DIAGNOSIS — E663 Overweight: Secondary | ICD-10-CM | POA: Insufficient documentation

## 2021-11-13 DIAGNOSIS — K219 Gastro-esophageal reflux disease without esophagitis: Secondary | ICD-10-CM

## 2021-11-13 DIAGNOSIS — Z6825 Body mass index (BMI) 25.0-25.9, adult: Secondary | ICD-10-CM

## 2021-11-13 DIAGNOSIS — Z6826 Body mass index (BMI) 26.0-26.9, adult: Secondary | ICD-10-CM | POA: Insufficient documentation

## 2021-11-13 NOTE — Assessment & Plan Note (Signed)
Continue Depakote and Lamotrigine He will continue to follow with neurology

## 2021-11-13 NOTE — Assessment & Plan Note (Signed)
Continue Nurtec as needed He will continue to follow with neurology

## 2021-11-13 NOTE — Assessment & Plan Note (Signed)
Encourage diet and exercise for weight loss 

## 2021-11-13 NOTE — Addendum Note (Signed)
Addended by: Wilson Singer on: 11/13/2021 10:34 AM   Modules accepted: Orders

## 2021-11-13 NOTE — Patient Instructions (Signed)
Orthostatic Hypotension Blood pressure is a measurement of how strongly, or weakly, your circulating blood is pressing against the walls of your arteries. Orthostatic hypotension is a drop in blood pressure that can happen when you change positions, such as when you go from lying down to standing. Arteries are blood vessels that carry blood from your heart throughout your body. When blood pressure is too low, you may not get enough blood to your brain or to the rest of your organs. Orthostatic hypotension can cause light-headedness, sweating, rapid heartbeat, blurred vision, and fainting. These symptoms require further investigation into the cause. What are the causes? Orthostatic hypotension can be caused by many things, including: Sudden changes in posture, such as standing up quickly after you have been sitting or lying down. Loss of blood (anemia) or loss of body fluids (dehydration). Heart problems, neurologic problems, or hormone problems. Pregnancy. Aging. The risk for this condition increases as you get older. Severe infection (sepsis). Certain medicines, such as medicines for high blood pressure or medicines that make the body lose excess fluids (diuretics). What are the signs or symptoms? Symptoms of this condition may include: Weakness, light-headedness, or dizziness. Sweating. Blurred vision. Tiredness (fatigue). Rapid heartbeat. Fainting, in severe cases. How is this diagnosed? This condition is diagnosed based on: Your symptoms and medical history. Your blood pressure measurements. Your health care provider will check your blood pressure when you are: Lying down. Sitting. Standing. A blood pressure reading is recorded as two numbers, such as "120 over 80" (or 120/80). The first ("top") number is called the systolic pressure. It is a measure of the pressure in your arteries as your heart beats. The second ("bottom") number is called the diastolic pressure. It is a measure of  the pressure in your arteries when your heart relaxes between beats. Blood pressure is measured in a unit called mmHg. Healthy blood pressure for most adults is 120/80 mmHg. Orthostatic hypotension is defined as a 20 mmHg drop in systolic pressure or a 10 mmHg drop in diastolic pressure within 3 minutes of standing. Other information or tests that may be used to diagnose orthostatic hypotension include: Your other vital signs, such as your heart rate and temperature. Blood tests. An electrocardiogram (ECG) or echocardiogram. A Holter monitor. This is a device you wear that records your heart rhythm continuously, usually for 24-48 hours. Tilt table test. For this test, you will be safely secured to a table that moves you from a lying position to an upright position. Your heart rhythm and blood pressure will be monitored during the test. How is this treated? This condition may be treated by: Changing your diet. This may involve eating more salt (sodium) or drinking more water. Changing the dosage of certain medicines you are taking that might be lowering your blood pressure. Correcting the underlying reason for the orthostatic hypotension. Wearing compression stockings. Taking medicines to raise your blood pressure. Avoiding actions that trigger symptoms. Follow these instructions at home: Medicines Take over-the-counter and prescription medicines only as told by your health care provider. Follow instructions from your health care provider about changing the dosage of your current medicines, if this applies. Do not stop or adjust any of your medicines on your own. Eating and drinking  Drink enough fluid to keep your urine pale yellow. Eat extra salt only as directed. Do not add extra salt to your diet unless advised by your health care provider. Eat frequent, small meals. Avoid standing up suddenly after eating. General instructions    Get up slowly from lying down or sitting positions. This  gives your blood pressure a chance to adjust. Avoid hot showers and excessive heat as directed by your health care provider. Engage in regular physical activity as directed by your health care provider. If you have compression stockings, wear them as told. Keep all follow-up visits. This is important. Contact a health care provider if: You have a fever for more than 2-3 days. You feel more thirsty than usual. You feel dizzy or weak. Get help right away if: You have chest pain. You have a fast or irregular heartbeat. You become sweaty or feel light-headed. You feel short of breath. You faint. You have any symptoms of a stroke. "BE FAST" is an easy way to remember the main warning signs of a stroke: B - Balance. Signs are dizziness, sudden trouble walking, or loss of balance. E - Eyes. Signs are trouble seeing or a sudden change in vision. F - Face. Signs are sudden weakness or numbness of the face, or the face or eyelid drooping on one side. A - Arms. Signs are weakness or numbness in an arm. This happens suddenly and usually on one side of the body. S - Speech. Signs are sudden trouble speaking, slurred speech, or trouble understanding what people say. T - Time. Time to call emergency services. Write down what time symptoms started. You have other signs of a stroke, such as: A sudden, severe headache with no known cause. Nausea or vomiting. Seizure. These symptoms may represent a serious problem that is an emergency. Do not wait to see if the symptoms will go away. Get medical help right away. Call your local emergency services (911 in the [place] Do not drive yourself to the hospital. Summary Orthostatic hypotension is a sudden drop in blood pressure. It can cause light-headedness, sweating, rapid heartbeat, blurred vision, and fainting. Orthostatic hypotension can be diagnosed by having your blood pressure taken while lying down, sitting, and then standing. Treatment may involve  changing your diet, wearing compression stockings, sitting up slowly, adjusting your medicines, or correcting the underlying reason for the orthostatic hypotension. Get help right away if you have chest pain, a fast or irregular heartbeat, or symptoms of a stroke. This information is not intended to replace advice given to you by your health care provider. Make sure you discuss any questions you have with your health care provider. Document Revised: 01/17/2021 Document Reviewed: 01/17/2021 Elsevier Patient Education  2022 [employer]  

## 2021-11-13 NOTE — Assessment & Plan Note (Signed)
CMET and lipid profile today Encouraged him to consume a low fat diet 

## 2021-11-13 NOTE — Assessment & Plan Note (Signed)
Currently not an issue off meds Will monitor 

## 2021-11-13 NOTE — Assessment & Plan Note (Addendum)
CMET and lipid profile today Encouraged him to consume a low fat diet No anticoagulation secondary to previous intracranial bleed

## 2021-11-13 NOTE — Progress Notes (Signed)
° °Subjective:  ° ° Patient ID: Ralph Simpson, male    DOB: 09/08/1961, 60 y.o.   MRN: [id number] ° °HPI ° °Patient presents the clinic today for follow-up of chronic conditions. ° °AVN, Bilateral Hips: Status post bone grafting.  He follows with [facility].  He is currently on disability secondary to this. ° °GERD: Currently not an issue.  He does not take anything for this. Upper GI from 07/2021 reviewed. ° °Migraines: Seasonal. These occur about every 2-3. He takes Nurtec and lays in a dark room with some relief of symptoms. He follows with neurology. ° °Prediabetes: His last A1c was 5.9%, 08/2020.  He is not taking any oral diabetic medication at this time.  His sugars range 128-150. ° °HLD with Aortic Atherosclerosis: His last LDL was 130, triglycerides 83, 01/2020.  He is not taking any following medication at this time.  He tries to consume a low-fat diet. ° °Seizures: He denies recent seizure on Depakote and Lamotrigine.  He follows with neurology. ° °Neutropenia: His last WBC was 1.7%, 07/2021.  He is not seeing a hematologist. ° °He also reports cut of left thumb. This occurred yesterday. He cleansed it with Peroxide and covered it with a bandaid. ° °Review of Systems ° °   °Past Medical History:  °Diagnosis Date  ° Allergy   ° GERD (gastroesophageal reflux disease)   ° Migraine   ° Seizures (HCC)   ° x 2 03/2021  ° ° °Current Outpatient Medications  °Medication Sig Dispense Refill  ° divalproex (DEPAKOTE ER) 500 MG 24 hr tablet Take 1,000 mg by mouth daily.    ° lamoTRIgine (LAMICTAL) 100 MG tablet Take 1 tablet (100 mg total) by mouth 2 (two) times daily. 60 tablet 11  ° ondansetron (ZOFRAN ODT) 4 MG disintegrating tablet Take 1 tablet (4 mg total) by mouth every 8 (eight) hours as needed for nausea or vomiting. 20 tablet 6  ° pantoprazole (PROTONIX) 40 MG tablet Take 1 tablet (40 mg total) by mouth daily. 30 tablet 0  ° Rimegepant Sulfate (NURTEC) 75 MG TBDP Take 1 tab at onset of migraine.  May  repeat in 2 hrs, if needed.  Max dose: 2 tabs/day. This is a 30 day prescription. 12 tablet 11  ° SUMAtriptan (IMITREX) 50 MG tablet Take 1 tablet (50 mg total) by mouth every 2 (two) hours as needed. May repeat in 2 hours if headache persists or recurs. 12 tablet 6  ° °No current facility-administered medications for this visit.  ° ° °No Known Allergies ° °Family History  °Problem Relation Age of Onset  ° Arthritis Mother   ° Stroke Mother   ° Diabetes Father   ° Heart disease Father   ° Hyperlipidemia Father   ° Hypertension Father   ° Kidney disease Maternal Grandfather   ° Colon cancer Neg Hx   ° Stomach cancer Neg Hx   ° Rectal cancer Neg Hx   ° ° °Social History  ° °Socioeconomic History  ° Marital status: Single  °  Spouse name: Not on file  ° Number of children: 0  ° Years of education: Not on file  ° Highest education level: 10th grade  °Occupational History  °  Comment: NA  °Tobacco Use  ° Smoking status: Former  °  Packs/day: 1.00  °  Types: Cigarettes  ° Smokeless tobacco: Former  °  Types: Chew  ° Tobacco comments:  °  quit 10-15 years ago  °Vaping Use  °   Vaping Use: Never used  °Substance and Sexual Activity  ° Alcohol use: Yes  °  Comment: occasional  ° Drug use: No  ° Sexual activity: Not Currently  °Other Topics Concern  ° Not on file  °Social History Narrative  ° Not on file  ° °Social Determinants of Health  ° °Financial Resource Strain: Not on file  °Food Insecurity: Not on file  °Transportation Needs: Not on file  °Physical Activity: Not on file  °Stress: Not on file  °Social Connections: Not on file  °Intimate Partner Violence: Not on file  ° ° ° °Constitutional: Patient reports intermittent headaches.  Denies fever, malaise, fatigue, or abrupt weight changes.  °Respiratory: Denies difficulty breathing, shortness of breath, cough or sputum production.   °Cardiovascular: Denies chest pain, chest tightness, palpitations or swelling in the hands or feet.  °Gastrointestinal: Patient reports  intermittent reflux.  Denies abdominal pain, bloating, constipation, diarrhea or blood in the stool.  °GU: Denies urgency, frequency, pain with urination, burning sensation, blood in urine, odor or discharge. °Musculoskeletal: Patient reports bilateral hip pain.  Denies decrease in range of motion, difficulty with gait, muscle pain or jointswelling.  °Skin: Patient reports a cut of his left thumb.  Denies redness, rashes, lesions or ulcercations.  °Neurological: Patient reports intermittent dizziness.  Denies difficulty with memory, difficulty with speech or problems with balance and coordination.  ° ° °No other specific complaints in a complete review of systems (except as listed in HPI above). ° °Objective:  ° Physical Exam ° °BP 96/67 (BP Location: Right Arm, Patient Position: Sitting, Cuff Size: Normal)    Pulse 74    Temp (!) 97.3 °F (36.3 °C) (Temporal)    Resp 18    Ht 6' 3" (1.905 m)    Wt 207 lb 3.2 oz (94 kg)    SpO2 100%    BMI 25.90 kg/m²  ° °Wt Readings from Last 3 Encounters:  °08/09/21 201 lb (91.2 kg)  °07/29/21 200 lb (90.7 kg)  °07/16/21 200 lb 8 oz (90.9 kg)  ° ° °General: Appears his stated age, overweight, in NAD. °Skin: Warm, dry and intact.  Minor cut noted to pad of left thumb. °HEENT: Head: normal shape and size; Eyes: sclera white and EOMs intact;  °Neck:  Neck supple, trachea midline. No masses, lumps or thyromegaly present.  °Cardiovascular: Normal rate and rhythm. S1,S2 noted.  No murmur, rubs or gallops noted. No JVD or BLE edema. No carotid bruits noted. °Pulmonary/Chest: Normal effort and positive vesicular breath sounds. No respiratory distress. No wheezes, rales or ronchi noted.  °Abdomen: . Normal bowel sounds.  °Musculoskeletal: Walking with cane for assistance. °Neurological: Alert and oriented. Coordination normal.  ° ° °BMET °   °Component Value Date/Time  ° NA 136 08/09/2021 1621  ° NA 141 07/16/2021 0813  ° K 4.5 08/09/2021 1621  ° CL 102 08/09/2021 1621  ° CO2 26 08/09/2021  1621  ° GLUCOSE 147 (H) 08/09/2021 1621  ° BUN 16 08/09/2021 1621  ° BUN 20 07/16/2021 0813  ° CREATININE 1.15 08/09/2021 1621  ° CALCIUM 8.6 08/09/2021 1621  ° GFRNONAA >60 04/07/2021 0435  ° GFRAA >60 11/11/2015 1340  ° ° °Lipid Panel  °   °Component Value Date/Time  ° CHOL 194 03/05/2020 1003  ° TRIG 83.0 03/05/2020 1003  ° HDL 48.20 03/05/2020 1003  ° CHOLHDL 4 03/05/2020 1003  ° VLDL 16.6 03/05/2020 1003  ° LDLCALC 130 (H) 03/05/2020 1003  ° ° °CBC °   °  Component Value Date/Time   WBC 1.7 (L) 08/09/2021 1621   RBC 4.51 08/09/2021 1621   HGB 13.1 (L) 08/09/2021 1621   HGB 15.0 07/16/2021 0813   HCT 38.5 08/09/2021 1621   HCT 44.2 07/16/2021 0813   PLT 132 (L) 08/09/2021 1621   MCV 85.4 08/09/2021 1621   MCV 86 07/16/2021 0813   MCH 29.0 08/09/2021 1621   MCHC 34.0 08/09/2021 1621   RDW 12.6 08/09/2021 1621   RDW 12.8 07/16/2021 0813   LYMPHSABS 587 (L) 08/09/2021 1621   LYMPHSABS 1.2 07/16/2021 0813   MONOABS 0.5 04/06/2021 1810   EOSABS 0 (L) 08/09/2021 1621   EOSABS 0.1 07/16/2021 0813   BASOSABS 29 08/09/2021 1621   BASOSABS 0.0 07/16/2021 0813    Hgb A1C Lab Results  Component Value Date   HGBA1C 5.9 (A) 09/04/2020          Assessment & Plan:   Low Blood Pressure, Intermittent Dizziness:  DDx include dehydration, orthostasis Orthostatics positive for drop in blood pressure C-Met today Encourage adequate water intake Would consider treatment with fFudrocortisone versus Midodrine pending labs  We will follow-up after labs with further recommendation and treatment plan Webb Silversmith, NP This visit occurred during the SARS-CoV-2 public health emergency.  Safety protocols were in place, including screening questions prior to the visit, additional usage of staff PPE, and extensive cleaning of exam room while observing appropriate contact time as indicated for disinfecting solutions.

## 2021-11-13 NOTE — Assessment & Plan Note (Signed)
A1c today Encouraged him to consume a low-carb diet

## 2021-11-13 NOTE — Assessment & Plan Note (Addendum)
CBC with differential Will likely need referral to hematology pending lab review

## 2021-11-14 LAB — CBC WITH DIFFERENTIAL/PLATELET
Absolute Monocytes: 317 cells/uL (ref 200–950)
Basophils Absolute: 21 cells/uL (ref 0–200)
Basophils Relative: 0.8 %
Eosinophils Absolute: 29 cells/uL (ref 15–500)
Eosinophils Relative: 1.1 %
HCT: 43.2 % (ref 38.5–50.0)
Hemoglobin: 14.7 g/dL (ref 13.2–17.1)
Lymphs Abs: 972 cells/uL (ref 850–3900)
MCH: 28.9 pg (ref 27.0–33.0)
MCHC: 34 g/dL (ref 32.0–36.0)
MCV: 84.9 fL (ref 80.0–100.0)
MPV: 9.4 fL (ref 7.5–12.5)
Monocytes Relative: 12.2 %
Neutro Abs: 1261 cells/uL — ABNORMAL LOW (ref 1500–7800)
Neutrophils Relative %: 48.5 %
Platelets: 203 10*3/uL (ref 140–400)
RBC: 5.09 10*6/uL (ref 4.20–5.80)
RDW: 14 % (ref 11.0–15.0)
Total Lymphocyte: 37.4 %
WBC: 2.6 10*3/uL — ABNORMAL LOW (ref 3.8–10.8)

## 2021-11-14 LAB — HEMOGLOBIN A1C
Hgb A1c MFr Bld: 7.2 % of total Hgb — ABNORMAL HIGH (ref <5.7)
Mean Plasma Glucose: 160 mg/dL
eAG (mmol/L): 8.9 mmol/L

## 2021-11-14 LAB — COMPLETE METABOLIC PANEL WITH GFR
AG Ratio: 2.4 (calc) (ref 1.0–2.5)
ALT: 16 U/L (ref 9–46)
AST: 12 U/L (ref 10–35)
Albumin: 4.7 g/dL (ref 3.6–5.1)
Alkaline phosphatase (APISO): 56 U/L (ref 35–144)
BUN: 22 mg/dL (ref 7–25)
CO2: 28 mmol/L (ref 20–32)
Calcium: 9.3 mg/dL (ref 8.6–10.3)
Chloride: 103 mmol/L (ref 98–110)
Creat: 0.93 mg/dL (ref 0.70–1.35)
Globulin: 2 g/dL (calc) (ref 1.9–3.7)
Glucose, Bld: 158 mg/dL — ABNORMAL HIGH (ref 65–99)
Potassium: 5 mmol/L (ref 3.5–5.3)
Sodium: 139 mmol/L (ref 135–146)
Total Bilirubin: 0.4 mg/dL (ref 0.2–1.2)
Total Protein: 6.7 g/dL (ref 6.1–8.1)
eGFR: 93 mL/min/{1.73_m2} (ref >=60)

## 2021-11-14 LAB — LIPID PANEL
Cholesterol: 198 mg/dL (ref <200)
HDL: 34 mg/dL — ABNORMAL LOW (ref >=40)
LDL Cholesterol (Calc): 141 mg/dL (calc) — ABNORMAL HIGH
Non-HDL Cholesterol (Calc): 164 mg/dL (calc) — ABNORMAL HIGH (ref <130)
Total CHOL/HDL Ratio: 5.8 (calc) — ABNORMAL HIGH (ref <5.0)
Triglycerides: 111 mg/dL (ref <150)

## 2021-11-15 ENCOUNTER — Encounter: Payer: Self-pay | Admitting: Internal Medicine

## 2021-11-15 ENCOUNTER — Other Ambulatory Visit: Payer: Self-pay

## 2021-11-15 ENCOUNTER — Ambulatory Visit (INDEPENDENT_AMBULATORY_CARE_PROVIDER_SITE_OTHER): Payer: Medicare Other | Admitting: Internal Medicine

## 2021-11-15 VITALS — BP 107/65 | HR 80 | Temp 97.3°F | Resp 17 | Ht 75.0 in | Wt 208.4 lb

## 2021-11-15 DIAGNOSIS — E782 Mixed hyperlipidemia: Secondary | ICD-10-CM

## 2021-11-15 DIAGNOSIS — Z6826 Body mass index (BMI) 26.0-26.9, adult: Secondary | ICD-10-CM

## 2021-11-15 DIAGNOSIS — E1165 Type 2 diabetes mellitus with hyperglycemia: Secondary | ICD-10-CM | POA: Insufficient documentation

## 2021-11-15 DIAGNOSIS — E663 Overweight: Secondary | ICD-10-CM

## 2021-11-15 DIAGNOSIS — D709 Neutropenia, unspecified: Secondary | ICD-10-CM | POA: Diagnosis not present

## 2021-11-15 NOTE — Assessment & Plan Note (Signed)
He declines starting cholesterol-lowering medication at this time Encouraged him to consume a low-fat diet We will repeat lipid profile in 3 months, advised if LDL >100 we will need to start statin therapy.

## 2021-11-15 NOTE — Patient Instructions (Signed)

## 2021-11-15 NOTE — Assessment & Plan Note (Signed)
Referral to hematology placed.

## 2021-11-15 NOTE — Assessment & Plan Note (Signed)
New onset Discussed diabetes and standards of medical care He would like to try lifestyle changes and lieu of medication therapy at this time Encouraged him to consume a low-carb diet and exercise for weight loss Encourage routine foot exams He has an eye exam scheduled He declines flu, Pneumovax and COVID-vaccine

## 2021-11-15 NOTE — Progress Notes (Signed)
Subjective:    Patient ID: Ralph Simpson, male    DOB: 1961/01/08, 60 y.o.   MRN: [id number]  HPI  Patient presents the clinic today for follow-up of recent labs.  His CBC showed a WBC count of 2.6.  He has been neutropenic in the past but has never had this evaluated.  His most recent A1c was 7.2%.  He has had prediabetes in the past and was seen earlier this week with concern for elevated blood sugars.  He is not taking any oral diabetic medication at this time.  His sugars range 160-212.  His last eye exam is scheduled for January.  He does not check his feet routinely.  He has not had a flu, Pneumovax or COVID-vaccine.  His LDL was 141, triglycerides 111.  He is not taking any cholesterol-lowering medication at this time.  He does not consume a low-fat diet.  Review of Systems     Past Medical History:  Diagnosis Date   Allergy    GERD (gastroesophageal reflux disease)    Migraine    Seizures (HCC)    x 2 03/2021    Current Outpatient Medications  Medication Sig Dispense Refill   divalproex (DEPAKOTE ER) 500 MG 24 hr tablet Take 1,000 mg by mouth daily.     lamoTRIgine (LAMICTAL) 100 MG tablet Take 1 tablet (100 mg total) by mouth 2 (two) times daily. 60 tablet 11   pantoprazole (PROTONIX) 40 MG tablet Take 1 tablet (40 mg total) by mouth daily. 30 tablet 0   Rimegepant Sulfate (NURTEC) 75 MG TBDP Take 1 tab at onset of migraine.  May repeat in 2 hrs, if needed.  Max dose: 2 tabs/day. This is a 30 day prescription. 12 tablet 11   No current facility-administered medications for this visit.    No Known Allergies  Family History  Problem Relation Age of Onset   Arthritis Mother    Stroke Mother    Diabetes Father    Heart disease Father    Hyperlipidemia Father    Hypertension Father    Kidney disease Maternal Grandfather    Colon cancer Neg Hx    Stomach cancer Neg Hx    Rectal cancer Neg Hx     Social History   Socioeconomic History   Marital status: Single     Spouse name: Not on file   Number of children: 0   Years of education: Not on file   Highest education level: 10th grade  Occupational History    Comment: NA  Tobacco Use   Smoking status: Former    Packs/day: 1.00    Types: Cigarettes   Smokeless tobacco: Former    Types: Chew   Tobacco comments:    quit 10-15 years ago  Vaping Use   Vaping Use: Never used  Substance and Sexual Activity   Alcohol use: Not Currently   Drug use: No   Sexual activity: Not Currently  Other Topics Concern   Not on file  Social History Narrative   Not on file   Social Determinants of Health   Financial Resource Strain: Not on file  Food Insecurity: Not on file  Transportation Needs: Not on file  Physical Activity: Not on file  Stress: Not on file  Social Connections: Not on file  Intimate Partner Violence: Not on file     Constitutional: Denies fever, malaise, fatigue, headache or abrupt weight changes.  HEENT: Denies eye pain, eye redness, ear pain, ringing in the  ears, wax buildup, runny nose, nasal congestion, bloody nose, or sore throat. Respiratory: Denies difficulty breathing, shortness of breath, cough or sputum production.   Cardiovascular: Denies chest pain, chest tightness, palpitations or swelling in the hands or feet.  Gastrointestinal: Pt reports increased thirst. Denies abdominal pain, bloating, constipation, diarrhea or blood in the stool.  GU: Pt reports urinary frequency. Denies urgency, pain with urination, burning sensation, blood in urine, odor or discharge. Skin: Denies redness, rashes, lesions or ulcercations.  Neurological: Denies numbness, tingling, weakness of lower extremities.   No other specific complaints in a complete review of systems (except as listed in HPI above).  Objective:   Physical Exam  BP 107/65 (BP Location: Right Arm, Patient Position: Sitting, Cuff Size: Normal)    Pulse 80    Temp (!) 97.3 F (36.3 C) (Temporal)    Resp 17    Ht 6\' 3"  (1.905  m)    Wt 208 lb 6.4 oz (94.5 kg)    SpO2 99%    BMI 26.05 kg/m   Wt Readings from Last 3 Encounters:  11/13/21 207 lb 3.2 oz (94 kg)  08/09/21 201 lb (91.2 kg)  07/29/21 200 lb (90.7 kg)    General: Appears his stated age,overweight, in NAD. Skin: Warm, dry and intact. No ulcerations noted. HEENT: Head: normal shape and size; Eyes: sclera white and EOMs intact;  Cardiovascular: Normal rate and rhythm.  Pulmonary/Chest: Normal effort and positive vesicular breath sounds.  Musculoskeletal: No difficulty with gait.  Neurological: Alert and oriented.    BMET    Component Value Date/Time   NA 139 11/13/2021 1016   NA 141 07/16/2021 0813   K 5.0 11/13/2021 1016   CL 103 11/13/2021 1016   CO2 28 11/13/2021 1016   GLUCOSE 158 (H) 11/13/2021 1016   BUN 22 11/13/2021 1016   BUN 20 07/16/2021 0813   CREATININE 0.93 11/13/2021 1016   CALCIUM 9.3 11/13/2021 1016   GFRNONAA >60 04/07/2021 0435   GFRAA >60 11/11/2015 1340    Lipid Panel     Component Value Date/Time   CHOL 198 11/13/2021 1016   TRIG 111 11/13/2021 1016   HDL 34 (L) 11/13/2021 1016   CHOLHDL 5.8 (H) 11/13/2021 1016   VLDL 16.6 03/05/2020 1003   LDLCALC 141 (H) 11/13/2021 1016    CBC    Component Value Date/Time   WBC 2.6 (L) 11/13/2021 1016   RBC 5.09 11/13/2021 1016   HGB 14.7 11/13/2021 1016   HGB 15.0 07/16/2021 0813   HCT 43.2 11/13/2021 1016   HCT 44.2 07/16/2021 0813   PLT 203 11/13/2021 1016   MCV 84.9 11/13/2021 1016   MCV 86 07/16/2021 0813   MCH 28.9 11/13/2021 1016   MCHC 34.0 11/13/2021 1016   RDW 14.0 11/13/2021 1016   RDW 12.8 07/16/2021 0813   LYMPHSABS 972 11/13/2021 1016   LYMPHSABS 1.2 07/16/2021 0813   MONOABS 0.5 04/06/2021 1810   EOSABS 29 11/13/2021 1016   EOSABS 0.1 07/16/2021 0813   BASOSABS 21 11/13/2021 1016   BASOSABS 0.0 07/16/2021 0813    Hgb A1C Lab Results  Component Value Date   HGBA1C 7.2 (H) 11/13/2021        Assessment & Plan:    Webb Silversmith,  NP This visit occurred during the SARS-CoV-2 public health emergency.  Safety protocols were in place, including screening questions prior to the visit, additional usage of staff PPE, and extensive cleaning of exam room while observing appropriate contact time as  indicated for disinfecting solutions.

## 2021-11-15 NOTE — Assessment & Plan Note (Signed)
Encourage diet and exercise for weight loss 

## 2021-11-21 ENCOUNTER — Ambulatory Visit: Payer: Medicare Other | Admitting: Internal Medicine

## 2021-11-22 ENCOUNTER — Encounter: Payer: Self-pay | Admitting: *Deleted

## 2021-11-29 ENCOUNTER — Encounter: Payer: Self-pay | Admitting: Oncology

## 2021-11-29 ENCOUNTER — Other Ambulatory Visit: Payer: Self-pay

## 2021-11-29 ENCOUNTER — Inpatient Hospital Stay: Payer: Medicare Other

## 2021-11-29 ENCOUNTER — Inpatient Hospital Stay: Payer: Medicare Other | Attending: Oncology | Admitting: Oncology

## 2021-11-29 VITALS — BP 120/82 | HR 77 | Temp 97.1°F | Wt 199.1 lb

## 2021-11-29 DIAGNOSIS — Z833 Family history of diabetes mellitus: Secondary | ICD-10-CM | POA: Diagnosis not present

## 2021-11-29 DIAGNOSIS — D709 Neutropenia, unspecified: Secondary | ICD-10-CM | POA: Insufficient documentation

## 2021-11-29 DIAGNOSIS — Z79899 Other long term (current) drug therapy: Secondary | ICD-10-CM | POA: Insufficient documentation

## 2021-11-29 DIAGNOSIS — Z823 Family history of stroke: Secondary | ICD-10-CM | POA: Diagnosis not present

## 2021-11-29 DIAGNOSIS — Z841 Family history of disorders of kidney and ureter: Secondary | ICD-10-CM | POA: Insufficient documentation

## 2021-11-29 DIAGNOSIS — D708 Other neutropenia: Secondary | ICD-10-CM

## 2021-11-29 DIAGNOSIS — Z8249 Family history of ischemic heart disease and other diseases of the circulatory system: Secondary | ICD-10-CM | POA: Insufficient documentation

## 2021-11-29 DIAGNOSIS — Z8261 Family history of arthritis: Secondary | ICD-10-CM | POA: Diagnosis not present

## 2021-11-29 DIAGNOSIS — R519 Headache, unspecified: Secondary | ICD-10-CM | POA: Diagnosis not present

## 2021-11-29 DIAGNOSIS — Z87891 Personal history of nicotine dependence: Secondary | ICD-10-CM | POA: Diagnosis not present

## 2021-11-29 DIAGNOSIS — K219 Gastro-esophageal reflux disease without esophagitis: Secondary | ICD-10-CM | POA: Diagnosis not present

## 2021-11-29 DIAGNOSIS — Z8349 Family history of other endocrine, nutritional and metabolic diseases: Secondary | ICD-10-CM | POA: Diagnosis not present

## 2021-11-29 DIAGNOSIS — R569 Unspecified convulsions: Secondary | ICD-10-CM | POA: Diagnosis not present

## 2021-11-29 LAB — TECHNOLOGIST SMEAR REVIEW
Plt Morphology: NORMAL
RBC MORPHOLOGY: NORMAL
WBC MORPHOLOGY: NORMAL

## 2021-11-29 LAB — CBC WITH DIFFERENTIAL/PLATELET
Abs Immature Granulocytes: 0 10*3/uL (ref 0.00–0.07)
Basophils Absolute: 0 10*3/uL (ref 0.0–0.1)
Basophils Relative: 1 %
Eosinophils Absolute: 0 10*3/uL (ref 0.0–0.5)
Eosinophils Relative: 2 %
HCT: 44.9 % (ref 39.0–52.0)
Hemoglobin: 15.6 g/dL (ref 13.0–17.0)
Immature Granulocytes: 0 %
Lymphocytes Relative: 38 %
Lymphs Abs: 1 10*3/uL (ref 0.7–4.0)
MCH: 29.3 pg (ref 26.0–34.0)
MCHC: 34.7 g/dL (ref 30.0–36.0)
MCV: 84.2 fL (ref 80.0–100.0)
Monocytes Absolute: 0.3 10*3/uL (ref 0.1–1.0)
Monocytes Relative: 10 %
Neutro Abs: 1.2 10*3/uL — ABNORMAL LOW (ref 1.7–7.7)
Neutrophils Relative %: 49 %
Platelets: 174 10*3/uL (ref 150–400)
RBC: 5.33 MIL/uL (ref 4.22–5.81)
RDW: 13.7 % (ref 11.5–15.5)
WBC: 2.5 10*3/uL — ABNORMAL LOW (ref 4.0–10.5)
nRBC: 0 % (ref 0.0–0.2)

## 2021-11-29 LAB — FOLATE: Folate: 12 ng/mL (ref >5.9)

## 2021-11-29 LAB — VITAMIN B12: Vitamin B-12: 585 pg/mL (ref 180–914)

## 2021-11-29 LAB — LACTATE DEHYDROGENASE: LDH: 129 U/L (ref 98–192)

## 2021-11-29 NOTE — Progress Notes (Signed)
Hematology/Oncology Consult note  [phone] [phone]   Patient Care Team: Jearld Fenton, NP as PCP - General (Internal Medicine) Earlie Server, MD as Consulting Physician (Hematology and Oncology)  REFERRING PROVIDER: Jearld Fenton, NP  CHIEF COMPLAINTS/REASON FOR VISIT:  Evaluation of leukopenia  HISTORY OF PRESENTING ILLNESS:  Ralph Simpson is a 61 y.o. male who was seen in consultation at the request of Jearld Fenton, NP for evaluation of leukopenia.  11/13/2021, CBC showed white count of 2.6, ANC 1.2.  Normal hemoglobin and platelet counts.  Reviewed patient's previous history, patient has normal white count on 04/07/2021, after that he started to have decrease of total white count as well as neutropenia. Patient was hospitalized in May 2022 due to seizure activities.   Patient follows up with neurology Dr. Krista Blue.  Currently he is on Depakote as well as lamotrigine. Patient presented to emergency room on 08/04/2021.  Patient had a fever, body aches and headache.  ANC was decreased at 0.9.  Total white count 1.7.  Smear review showed leukopenia with absolute neutropenia.  Moderate left shift. Today patient reports feeling well.  Denies nausea vomiting diarrhea. Review of Systems  Constitutional:  Negative for appetite change, chills, fatigue, fever and unexpected weight change.  HENT:   Negative for hearing loss and voice change.   Eyes:  Negative for eye problems and icterus.  Respiratory:  Negative for chest tightness, cough and shortness of breath.   Cardiovascular:  Negative for chest pain and leg swelling.  Gastrointestinal:  Negative for abdominal distention and abdominal pain.  Endocrine: Negative for hot flashes.  Genitourinary:  Negative for difficulty urinating, dysuria and frequency.   Musculoskeletal:  Negative for arthralgias.  Skin:  Negative for itching and rash.  Neurological:  Negative for light-headedness and numbness.  Hematological:   Negative for adenopathy. Does not bruise/bleed easily.  Psychiatric/Behavioral:  Negative for confusion.    MEDICAL HISTORY:  Past Medical History:  Diagnosis Date   Allergy    Avascular necrosis of bones of both hips (HCC)    GERD (gastroesophageal reflux disease)    Migraine    Neutropenia (HCC)    Osteoarthritis of both hips    Plantar fasciitis    Seizures (HCC)    x 2 03/2021   Trochanteric bursitis of right hip     SURGICAL HISTORY: Past Surgical History:  Procedure Laterality Date   HERNIA REPAIR     HIP SURGERY     IR ANGIO INTRA EXTRACRAN SEL COM CAROTID INNOMINATE BILAT MOD SED  04/08/2021   IR ANGIO VERTEBRAL SEL SUBCLAVIAN INNOMINATE BILAT MOD SED  04/08/2021   IR US GUIDE VASC ACCESS RIGHT  04/08/2021    SOCIAL HISTORY: Social History   Socioeconomic History   Marital status: Single    Spouse name: Not on file   Number of children: 0   Years of education: Not on file   Highest education level: 10th grade  Occupational History    Comment: NA  Tobacco Use   Smoking status: Former    Packs/day: 1.00    Types: Cigarettes    Passive exposure: Never   Smokeless tobacco: Former    Types: Chew   Tobacco comments:    quit 10-15 years ago  Vaping Use   Vaping Use: Never used  Substance and Sexual Activity   Alcohol use: Not Currently   Drug use: No   Sexual activity: Not Currently  Other Topics Concern   Not on file  Social History Narrative   Not on file   Social Determinants of Health   Financial Resource Strain: Not on file  Food Insecurity: Not on file  Transportation Needs: Not on file  Physical Activity: Not on file  Stress: Not on file  Social Connections: Not on file  Intimate Partner Violence: Not on file    FAMILY HISTORY: Family History  Problem Relation Age of Onset   Arthritis Mother    Stroke Mother    Diabetes Father    Heart disease Father    Hyperlipidemia Father    Hypertension Father    Kidney disease Maternal  Grandfather    Colon cancer Neg Hx    Stomach cancer Neg Hx    Rectal cancer Neg Hx     ALLERGIES:  has No Known Allergies.  MEDICATIONS:  Current Outpatient Medications  Medication Sig Dispense Refill   divalproex (DEPAKOTE ER) 500 MG 24 hr tablet Take 1,000 mg by mouth daily.     lamoTRIgine (LAMICTAL) 100 MG tablet Take 1 tablet (100 mg total) by mouth 2 (two) times daily. 60 tablet 11   Rimegepant Sulfate (NURTEC) 75 MG TBDP Take 1 tab at onset of migraine.  May repeat in 2 hrs, if needed.  Max dose: 2 tabs/day. This is a 30 day prescription. 12 tablet 11   pantoprazole (PROTONIX) 40 MG tablet Take 1 tablet (40 mg total) by mouth daily. 30 tablet 0   No current facility-administered medications for this visit.     PHYSICAL EXAMINATION: ECOG PERFORMANCE STATUS: 0 - Asymptomatic Vitals:   11/29/21 0914  BP: 120/82  Pulse: 77  Temp: (!) 97.1 F (36.2 C)   Filed Weights   11/29/21 0914  Weight: 199 lb 1.6 oz (90.3 kg)    Physical Exam Constitutional:      General: He is not in acute distress. HENT:     Head: Normocephalic and atraumatic.  Eyes:     General: No scleral icterus. Cardiovascular:     Rate and Rhythm: Normal rate and regular rhythm.     Heart sounds: Normal heart sounds.  Pulmonary:     Effort: Pulmonary effort is normal. No respiratory distress.     Breath sounds: No wheezing.  Abdominal:     General: Bowel sounds are normal. There is no distension.     Palpations: Abdomen is soft.  Musculoskeletal:        General: No deformity. Normal range of motion.     Cervical back: Normal range of motion and neck supple.  Skin:    General: Skin is warm and dry.     Findings: No erythema or rash.  Neurological:     Mental Status: He is alert and oriented to person, place, and time. Mental status is at baseline.     Cranial Nerves: No cranial nerve deficit.     Coordination: Coordination normal.  Psychiatric:        Mood and Affect: Mood normal.     RADIOGRAPHIC STUDIES: I have personally reviewed the radiological images as listed and agreed with the findings in the report.  CMP Latest Ref Rng & Units 11/13/2021  Glucose 65 - 99 mg/dL 158(H)  BUN 7 - 25 mg/dL 22  Creatinine 0.70 - 1.35 mg/dL 0.93  Sodium 135 - 146 mmol/L 139  Potassium 3.5 - 5.3 mmol/L 5.0  Chloride 98 - 110 mmol/L 103  CO2 20 - 32 mmol/L 28  Calcium 8.6 - 10.3 mg/dL 9.3  Total Protein 6.1 -  8.1 g/dL 6.7  Total Bilirubin 0.2 - 1.2 mg/dL 0.4  Alkaline Phos 44 - 121 IU/L -  AST 10 - 35 U/L 12  ALT 9 - 46 U/L 16   CBC Latest Ref Rng & Units 11/29/2021  WBC 4.0 - 10.5 K/uL 2.5(L)  Hemoglobin 13.0 - 17.0 g/dL 15.6  Hematocrit 39.0 - 52.0 % 44.9  Platelets 150 - 400 K/uL 174    LABORATORY DATA:  I have reviewed the data as listed Lab Results  Component Value Date   WBC 2.5 (L) 11/29/2021   HGB 15.6 11/29/2021   HCT 44.9 11/29/2021   MCV 84.2 11/29/2021   PLT 174 11/29/2021   Recent Labs    04/06/21 1810 04/06/21 1829 04/06/21 2311 04/07/21 0435 07/16/21 0813 08/09/21 1621 11/13/21 1016  NA 137   < >  --  137 141 136 139  K 3.8   < >  --  3.6 4.5 4.5 5.0  CL 104   < >  --  106 102 102 103  CO2 26  --   --  '25 26 26 28  ' GLUCOSE 102*   < >  --  124* 173* 147* 158*  BUN 18   < >  --  '14 20 16 22  ' CREATININE 0.85   < >  --  0.86 0.99 1.15 0.93  CALCIUM 9.3  --   --  8.7* 9.4 8.6 9.3  GFRNONAA >60  --   --  >60  --   --   --   PROT 7.2  --   --   --  7.1 6.3 6.7  ALBUMIN 4.2  --  4.4  --  4.7  --   --   AST 20  --   --   --  13 48* 12  ALT 22  --   --   --  19 57* 16  ALKPHOS 61  --   --   --  66  --   --   BILITOT 0.6  --   --   --  0.3 0.5 0.4   < > = values in this interval not displayed.   Iron/TIBC/Ferritin/ %Sat No results found for: IRON, TIBC, FERRITIN, IRONPCTSAT   RADIOGRAPHIC STUDIES: I have personally reviewed the radiological images as listed and agreed with the findings in the report. No results found. 08/26/2021,  ultrasound abdomen limited right upper quadrant showed cholelithiasis without sonographic evidence of acute cholecystitis.   ASSESSMENT & PLAN:  1. Other neutropenia ([place])    #Chronic leukopenia was predominantly neutropenia, since May 2022.  Onset of leukopenia is after he was started on antiseizure medications. Gust with the patient that both Depakote and lamotrigine may contribute to neutropenia. I will order additional work-up to rule out other etiologies.  I will defer patient's neurologist regarding monitoring and adjustment of his seizure medications.  # Patient follow-up with me in approximately 3-4 weeks to review the above results.   Orders Placed This Encounter  Procedures   Folate    Standing Status:   Future    Number of Occurrences:   1    Standing Expiration Date:   11/29/2022   Vitamin B12    Standing Status:   Future    Number of Occurrences:   1    Standing Expiration Date:   11/29/2022   Technologist smear review    Standing Status:   Future    Number of Occurrences:   1  Standing Expiration Date:   11/29/2022   CBC with Differential/Platelet    Standing Status:   Future    Number of Occurrences:   1    Standing Expiration Date:   11/29/2022   Kappa/lambda light chains    Standing Status:   Future    Number of Occurrences:   1    Standing Expiration Date:   11/29/2022   Multiple Myeloma Panel (SPEP&IFE w/QIG)    Standing Status:   Future    Number of Occurrences:   1    Standing Expiration Date:   11/29/2022   Flow cytometry panel-leukemia/lymphoma work-up    Standing Status:   Future    Number of Occurrences:   1    Standing Expiration Date:   11/29/2022   Lactate dehydrogenase    Standing Status:   Future    Number of Occurrences:   1    Standing Expiration Date:   11/29/2022    All questions were answered. The patient knows to call the clinic with any problems questions or concerns.  Cc Jearld Fenton, NP   Thank you for this kind referral and the  opportunity to participate in the care of this patient. A copy of today's note is routed to referring provider      Earlie Server, MD, PhD [facility] Hematology Oncology 11/29/2021

## 2021-12-02 LAB — COMP PANEL: LEUKEMIA/LYMPHOMA

## 2021-12-02 LAB — KAPPA/LAMBDA LIGHT CHAINS
Kappa free light chain: 18.1 mg/L (ref 3.3–19.4)
Kappa, lambda light chain ratio: 1.32 (ref 0.26–1.65)
Lambda free light chains: 13.7 mg/L (ref 5.7–26.3)

## 2021-12-05 LAB — MULTIPLE MYELOMA PANEL, SERUM
Albumin SerPl Elph-Mcnc: 4.1 g/dL (ref 2.9–4.4)
Albumin/Glob SerPl: 1.6 (ref 0.7–1.7)
Alpha 1: 0.2 g/dL (ref 0.0–0.4)
Alpha2 Glob SerPl Elph-Mcnc: 0.6 g/dL (ref 0.4–1.0)
B-Globulin SerPl Elph-Mcnc: 1.1 g/dL (ref 0.7–1.3)
Gamma Glob SerPl Elph-Mcnc: 0.8 g/dL (ref 0.4–1.8)
Globulin, Total: 2.6 g/dL (ref 2.2–3.9)
IgA: 79 mg/dL (ref 61–437)
IgG (Immunoglobin G), Serum: 794 mg/dL (ref 603–1613)
IgM (Immunoglobulin M), Srm: 88 mg/dL (ref 20–172)
Total Protein ELP: 6.7 g/dL (ref 6.0–8.5)

## 2021-12-12 ENCOUNTER — Telehealth: Payer: Self-pay | Admitting: Oncology

## 2021-12-12 NOTE — Telephone Encounter (Signed)
Patient left message with answering service requesting to move his 2/10 appointment to 4pm.  He had a seizure and is dependent on family to drive him.  I explained that Dr. Tasia Catchings doesn't have afternoon appointments on Friday and her last EST pt appointment is 245 Mon-Thurs.  Patient requested to see the NP on 2/13 at 330 in order to accommodate his transportation needs.  Routing to clinical team for follow up.

## 2021-12-15 ENCOUNTER — Emergency Department (HOSPITAL_COMMUNITY)
Admission: EM | Admit: 2021-12-15 | Discharge: 2021-12-15 | Disposition: A | Discharge status: Home / Self Care | Payer: Medicare Other | Attending: Emergency Medicine | Admitting: Emergency Medicine

## 2021-12-15 ENCOUNTER — Encounter (HOSPITAL_COMMUNITY): Payer: Self-pay

## 2021-12-15 ENCOUNTER — Other Ambulatory Visit: Payer: Self-pay

## 2021-12-15 DIAGNOSIS — G40909 Epilepsy, unspecified, not intractable, without status epilepticus: Secondary | ICD-10-CM | POA: Insufficient documentation

## 2021-12-15 LAB — CBC WITH DIFFERENTIAL/PLATELET
Abs Immature Granulocytes: 0.02 10*3/uL (ref 0.00–0.07)
Basophils Absolute: 0 10*3/uL (ref 0.0–0.1)
Basophils Relative: 1 %
Eosinophils Absolute: 0.1 10*3/uL (ref 0.0–0.5)
Eosinophils Relative: 3 %
HCT: 43.6 % (ref 39.0–52.0)
Hemoglobin: 14.8 g/dL (ref 13.0–17.0)
Immature Granulocytes: 1 %
Lymphocytes Relative: 38 %
Lymphs Abs: 1.2 10*3/uL (ref 0.7–4.0)
MCH: 29.3 pg (ref 26.0–34.0)
MCHC: 33.9 g/dL (ref 30.0–36.0)
MCV: 86.3 fL (ref 80.0–100.0)
Monocytes Absolute: 0.3 10*3/uL (ref 0.1–1.0)
Monocytes Relative: 11 %
Neutro Abs: 1.4 10*3/uL — ABNORMAL LOW (ref 1.7–7.7)
Neutrophils Relative %: 46 %
Platelets: 201 10*3/uL (ref 150–400)
RBC: 5.05 MIL/uL (ref 4.22–5.81)
RDW: 14.1 % (ref 11.5–15.5)
WBC: 3 10*3/uL — ABNORMAL LOW (ref 4.0–10.5)
nRBC: 0 % (ref 0.0–0.2)

## 2021-12-15 LAB — BASIC METABOLIC PANEL
Anion gap: 7 (ref 5–15)
BUN: 27 mg/dL — ABNORMAL HIGH (ref 8–23)
CO2: 27 mmol/L (ref 22–32)
Calcium: 9.5 mg/dL (ref 8.9–10.3)
Chloride: 104 mmol/L (ref 98–111)
Creatinine, Ser: 1.18 mg/dL (ref 0.61–1.24)
GFR, Estimated: >60 mL/min (ref >60)
Glucose, Bld: 116 mg/dL — ABNORMAL HIGH (ref 70–99)
Potassium: 5 mmol/L (ref 3.5–5.1)
Sodium: 138 mmol/L (ref 135–145)

## 2021-12-15 NOTE — ED Triage Notes (Addendum)
Pt reports feeling like he had a seizure about 10 minutes ago. Pt states he thinks he may have also had a stroke because he is "unable to use his right arm" upon exam pt is not using his right arm, but when he began talking he started to use his right arm per normal. Hx of seizures and reports being compliant with medication.

## 2021-12-15 NOTE — ED Provider Notes (Signed)
[facility] DEPT Provider Note   CSN: [id number] Arrival date & time: 12/15/21  1206     History  Chief Complaint  Patient presents with   Seizures    Ralph Simpson is a 61 y.o. male.  Patient presents with a chief complaint of breakthrough seizure.  He has a history of seizures last one was about 7 months ago.  He states has been taking his medication regularly but he has been fasting to try to manage his blood sugars at home.  He was passenger in a vehicle with his friend when the driver noticed the patient was having a seizure they pulled over and brought to the ER for evaluation.  Denies any recent alcohol use.      Home Medications Prior to Admission medications   Medication Sig Start Date End Date Taking? Authorizing Provider  divalproex (DEPAKOTE ER) 500 MG 24 hr tablet Take 1,000 mg by mouth daily.    [provider]  lamoTRIgine (LAMICTAL) 100 MG tablet Take 1 tablet (100 mg total) by mouth 2 (two) times daily. 07/16/21   Marcial Pacas, MD  pantoprazole (PROTONIX) 40 MG tablet Take 1 tablet (40 mg total) by mouth daily. 07/29/21 08/28/21  Doran Stabler, MD  Rimegepant Sulfate (NURTEC) 75 MG TBDP Take 1 tab at onset of migraine.  May repeat in 2 hrs, if needed.  Max dose: 2 tabs/day. This is a 30 day prescription. 07/16/21   Marcial Pacas, MD      Allergies    Patient has no known allergies.    Review of Systems   Review of Systems  Constitutional:  Negative for fever.  HENT:  Negative for ear pain and sore throat.   Eyes:  Negative for pain.  Respiratory:  Negative for cough.   Cardiovascular:  Negative for chest pain.  Gastrointestinal:  Negative for abdominal pain.  Genitourinary:  Negative for flank pain.  Musculoskeletal:  Negative for back pain.  Skin:  Negative for color change and rash.  Neurological:  Negative for syncope.  All other systems reviewed and are negative.  Physical Exam Updated Vital Signs BP 123/80     Pulse 74    Temp 98.1 F (36.7 C) (Oral)    Resp 18    Ht 6\' 3"  (1.905 m)    Wt 90.3 kg    SpO2 95%    BMI 24.87 kg/m  Physical Exam Constitutional:      Appearance: He is well-developed.  HENT:     Head: Normocephalic.     Nose: Nose normal.  Eyes:     Extraocular Movements: Extraocular movements intact.  Cardiovascular:     Rate and Rhythm: Normal rate.  Pulmonary:     Effort: Pulmonary effort is normal.  Skin:    Coloration: Skin is not jaundiced.  Neurological:     General: No focal deficit present.     Mental Status: He is alert and oriented to person, place, and time. Mental status is at baseline.     Cranial Nerves: No cranial nerve deficit.     Motor: No weakness.    ED Results / Procedures / Treatments   Labs (all labs ordered are listed, but only abnormal results are displayed) Labs Reviewed  CBC WITH DIFFERENTIAL/PLATELET - Abnormal; Notable for the following components:      Result Value   WBC 3.0 (*)    Neutro Abs 1.4 (*)    All other components within normal limits  BASIC METABOLIC PANEL - Abnormal; Notable for the following components:   Glucose, Bld 116 (*)    BUN 27 (*)    All other components within normal limits    EKG None  Radiology No results found.  Procedures Procedures    Medications Ordered in ED Medications - No data to display  ED Course/ Medical Decision Making/ A&P                           Medical Decision Making Amount and/or Complexity of Data Reviewed Labs: ordered.   Labs are sent patient has persistent low white count 3.  Chemistry otherwise unremarkable.  Vital signs are stable.  Patient observed for several hours with no additional seizure activity.  Discharged home in stable condition, advised him to follow-up with his neurologist this week.  Advising immediate return for worsening symptoms or any additional concerns.        Final Clinical Impression(s) / ED Diagnoses Final diagnoses:  Seizure disorder  [facility])    Rx / DC Orders ED Discharge Orders     None         Luna Fuse, MD 12/15/21 [phone]

## 2021-12-15 NOTE — Discharge Instructions (Signed)
SEIZURE PRECAUTIONS °Per [place] DMV statutes, patients with seizures are not allowed to drive until they have been seizure-free for six months. °  °Use caution when using heavy equipment or power tools. Avoid working on ladders or at heights. Take showers instead of baths. Ensure the water temperature is not too high on the home water heater. Do not go swimming alone. Do not lock yourself in a room alone (i.e. bathroom). When caring for infants or small children, sit down when holding, feeding, or changing them to minimize risk of injury to the child in the event you have a seizure. Maintain good sleep hygiene. Avoid alcohol. °  °If patient has another seizure, call 911 and bring them back to the ED if: °A.  The seizure lasts longer than 5 minutes.      °B.  The patient doesn't wake shortly after the seizure or has new problems such as difficulty seeing, speaking or moving following the seizure °C.  The patient was injured during the seizure °D.  The patient has a temperature over 102 F (39C) °E.  The patient vomited during the seizure and now is having trouble breathing ° ° °

## 2021-12-18 ENCOUNTER — Encounter: Payer: Self-pay | Admitting: Neurology

## 2021-12-18 ENCOUNTER — Other Ambulatory Visit: Payer: Self-pay

## 2021-12-18 ENCOUNTER — Ambulatory Visit (INDEPENDENT_AMBULATORY_CARE_PROVIDER_SITE_OTHER): Payer: Medicare Other | Admitting: Neurology

## 2021-12-18 VITALS — BP 116/75 | HR 75 | Ht 75.0 in | Wt 203.5 lb

## 2021-12-18 DIAGNOSIS — G43C1 Periodic headache syndromes in child or adult, intractable: Secondary | ICD-10-CM | POA: Diagnosis not present

## 2021-12-18 DIAGNOSIS — G40209 Localization-related (focal) (partial) symptomatic epilepsy and epileptic syndromes with complex partial seizures, not intractable, without status epilepticus: Secondary | ICD-10-CM | POA: Diagnosis not present

## 2021-12-18 NOTE — Progress Notes (Signed)
PATIENT: Ralph Simpson DOB: [birth year]  REASON FOR VISIT: Follow up for seizure HISTORY FROM: Patient and friend, Ralph Simpson PRIMARY NEUROLOGIST: Dr. Krista Blue  HISTORY  Ralph Simpson is a 61 year old male, seen in request by Dr. Roland Rack to follow-up hospital discharge on Apr 09, 2021, he is accompanied by his brother at today's visit on Apr 12, 2021   I reviewed and summarized the referring note. PMHx. He has past medical history of bilateral hip avascular necrosis, status post bilateral fibular grafting, chronic low back pain, on disability   He had long history of chronic migraine headaches, his typical migraine are severe holoacranial pounding headache with light, noise sensitivity, it can happen 1-3 times a week, he used to take [facility] powder, now he take Advil, at least 2 doses of Excedrin Migraine with each episode of headache, he also has visual aura prior to his severe migraine headaches, he see tunnel vision, lose vision before headaches,   He reported frequent headaches over the past couple years, had his first right arm on controllable right arm jerking movement in September 2021, only last for few minutes, is associated with his severe headache,   He had similar episode in March 2022, he again presenting with severe migraine headaches, with uncontrollable right arm jerking, word finding difficulties, was brought in by his girlfriend to the emergency room, while in the waiting area, he had another episode   CT of the head showed left frontal subarachnoid hemorrhage   Which is confirmed by MRI of the brain with without contrast, area of subarachnoid/p.o. contrast enhancement over both convexities, left more than right, reactive enhancement in the setting of subarachnoid hemorrhage is favored   He had a CT angiogram of head and neck was normal   Also four-vessel angiogram on Apr 10, 2021 showed no evidence of vascular malformation   Extensive laboratory evaluations showed  no significant abnormality, other than slight elevated scleroderma IgG antibody   Spinal fluid testing showed significantly elevated RBC 2225 at tube 4, slight elevated total protein of 51, with otherwise no significant abnormalities   He is discharge with Depakote DR 500 mg twice a day, overall tolerating medication, complains of some fatigue, he had long history of difficulty sleeping due to chronic hip pain, tends to take frequent naps in his chair instead of lying in bed sleep   UPDATE July 16 2021: He had recurrent partial seizure on July 6 and July 12, transient right arm shaking, lasting for few minutes, no dysarthria no confusion, he has some side effect with Depakote ER 500 mg 2 tablets every night, complains of dizziness, but still taking it,  Gait abnormality due to hip pain, pending upper and lower GI study   Extensive laboratory evaluation in May 2022 showed normal or negative ESR C-reactive protein ANA hypercoagulable panel, Lyme titer, low titer scleroderma antibody, angiotension enzyme, rheumatoid factor,  Update December 18, 2021 SS: After last office visit with Dr. Krista Blue, Lamictal was added, MRI of the brain with and without contrast was ordered, but is yet to be completed.  Seen in the ER 12/15/2021 for breakthrough seizure,  labs showed WBC 3.0,glucose 116, creatinine 1.18.  He was riding home in the car with Ralph Simpson, right arm started shaking, head twisted to the side, snorting, he knew what was going on, no headache, lasted 5 minutes, she drove to the ER. Afterwards he felt weak, his right arm was "dead". 30 minutes later in the wheelchair his right arm started  shaking again, then left eye started twitching. Feeling in right arm came back 40 minutes later. Taking Depakote ER 500 mg, 2 daily, Lamictal 100 mg daily, was supposed to be taking lamictal 100 mg twice daily but this made him feel tired. Nurtec has helped for migraine. Doesn't eat breakfast, just 2 meals a day. Seeing  hematology for WBC low 1.7-3.0 around time of starting seizure medication. No recent migraines. With his migraines usually the left arm goes numb, used to have at least 2-3 a week.   Labs in August 2022 Depakote level 61, glucose 173, WBC 3.3  REVIEW OF SYSTEMS: Out of a complete 14 system review of symptoms, the patient complains only of the following symptoms, and all other reviewed systems are negative.  See HPI  ALLERGIES: No Known Allergies  HOME MEDICATIONS: Outpatient Medications Prior to Visit  Medication Sig Dispense Refill   divalproex (DEPAKOTE ER) 500 MG 24 hr tablet Take 1,000 mg by mouth daily.     lamoTRIgine (LAMICTAL) 100 MG tablet Take 1 tablet (100 mg total) by mouth 2 (two) times daily. 60 tablet 11   pantoprazole (PROTONIX) 40 MG tablet Take 1 tablet (40 mg total) by mouth daily. 30 tablet 0   Rimegepant Sulfate (NURTEC) 75 MG TBDP Take 1 tab at onset of migraine.  May repeat in 2 hrs, if needed.  Max dose: 2 tabs/day. This is a 30 day prescription. 12 tablet 11   No facility-administered medications prior to visit.    PAST MEDICAL HISTORY: Past Medical History:  Diagnosis Date   Allergy    Avascular necrosis of bones of both hips (HCC)    GERD (gastroesophageal reflux disease)    Migraine    Neutropenia (HCC)    Osteoarthritis of both hips    Plantar fasciitis    Seizures (HCC)    x 2 03/2021   Trochanteric bursitis of right hip     PAST SURGICAL HISTORY: Past Surgical History:  Procedure Laterality Date   HERNIA REPAIR     HIP SURGERY     IR ANGIO INTRA EXTRACRAN SEL COM CAROTID INNOMINATE BILAT MOD SED  04/08/2021   IR ANGIO VERTEBRAL SEL SUBCLAVIAN INNOMINATE BILAT MOD SED  04/08/2021   IR US GUIDE VASC ACCESS RIGHT  04/08/2021    FAMILY HISTORY: Family History  Problem Relation Age of Onset   Arthritis Mother    Stroke Mother    Diabetes Father    Heart disease Father    Hyperlipidemia Father    Hypertension Father    Kidney disease  Maternal Grandfather    Colon cancer Neg Hx    Stomach cancer Neg Hx    Rectal cancer Neg Hx     SOCIAL HISTORY: Social History   Socioeconomic History   Marital status: Single    Spouse name: Not on file   Number of children: 0   Years of education: Not on file   Highest education level: 10th grade  Occupational History    Comment: NA  Tobacco Use   Smoking status: Former    Packs/day: 1.00    Types: Cigarettes    Passive exposure: Never   Smokeless tobacco: Former    Types: Chew   Tobacco comments:    quit 10-15 years ago  Vaping Use   Vaping Use: Never used  Substance and Sexual Activity   Alcohol use: Not Currently   Drug use: No   Sexual activity: Not Currently  Other Topics Concern   Not  on file  Social History Narrative   Not on file   Social Determinants of Health   Financial Resource Strain: Not on file  Food Insecurity: Not on file  Transportation Needs: Not on file  Physical Activity: Not on file  Stress: Not on file  Social Connections: Not on file  Intimate Partner Violence: Not on file   PHYSICAL EXAM  Vitals:   12/18/21 1536  BP: 116/75  Pulse: 75  Weight: 203 lb 8 oz (92.3 kg)  Height: _0  (1.905 m)   Body mass index is 25.44 kg/m.  Generalized: Well developed, in no acute distress   Neurological examination  Mentation: Alert oriented to time, place, history taking. Follows all commands speech and language fluent Cranial nerve II-XII: Pupils were equal round reactive to light. Extraocular movements were full, visual field were full on confrontational test. Facial sensation and strength were normal. Head turning and shoulder shrug  were normal and symmetric. Motor: mild left hip flexion weakness Sensory: Sensory testing is intact to soft touch on all 4 extremities. No evidence of extinction is noted.  Coordination: Cerebellar testing reveals good finger-nose-finger and heel-to-shin bilaterally.  Gait and station: push off seated  position, tends to drag left leg, relies on cane Reflexes: Deep tendon reflexes are symmetric and normal bilaterally.   DIAGNOSTIC DATA (LABS, IMAGING, TESTING) - I reviewed patient records, labs, notes, testing and imaging myself where available.  Lab Results  Component Value Date   WBC 3.0 (L) 12/15/2021   HGB 14.8 12/15/2021   HCT 43.6 12/15/2021   MCV 86.3 12/15/2021   PLT 201 12/15/2021      Component Value Date/Time   NA 138 12/15/2021 1304   NA 141 07/16/2021 0813   K 5.0 12/15/2021 1304   CL 104 12/15/2021 1304   CO2 27 12/15/2021 1304   GLUCOSE 116 (H) 12/15/2021 1304   BUN 27 (H) 12/15/2021 1304   BUN 20 07/16/2021 0813   CREATININE 1.18 12/15/2021 1304   CREATININE 0.93 11/13/2021 1016   CALCIUM 9.5 12/15/2021 1304   PROT 6.7 11/13/2021 1016   PROT 7.1 07/16/2021 0813   ALBUMIN 4.7 07/16/2021 0813   AST 12 11/13/2021 1016   ALT 16 11/13/2021 1016   ALKPHOS 66 07/16/2021 0813   BILITOT 0.4 11/13/2021 1016   BILITOT 0.3 07/16/2021 0813   GFRNONAA >60 12/15/2021 1304   GFRAA >60 11/11/2015 1340   Lab Results  Component Value Date   CHOL 198 11/13/2021   HDL 34 (L) 11/13/2021   LDLCALC 141 (H) 11/13/2021   TRIG 111 11/13/2021   CHOLHDL 5.8 (H) 11/13/2021   Lab Results  Component Value Date   HGBA1C 7.2 (H) 11/13/2021   Lab Results  Component Value Date   VITAMINB12 585 11/29/2021   No results found for: TSH  ASSESSMENT AND PLAN 61 y.o. year old male  has a past medical history of Allergy, Avascular necrosis of bones of both hips ([place]), GERD (gastroesophageal reflux disease), Migraine, Neutropenia ([place]), Osteoarthritis of both hips, Plantar fasciitis, Seizures ([place]), and Trochanteric bursitis of right hip. here with:  1.  Subarachnoid hemorrhage 2.  Partial seizure 3.  Chronic migraine headache  -Recent seizure 12/15/21 on Depakote ER 1000 mg daily, Lamictal 100 mg daily; described as right arm jerking, head twisting to the right side with snorting,  no headache  -Concern for neutropenia related to Depakote, is seeing hematology; Will go ahead titrate off Depakote, titrate up Lamictal for seizures -Check MRI of the  brain with and without contrast, Dr. Krista Blue ordered last visit but was not completed  -Continue Nurtec for acute headache treatment -MRI of the brain with without contrast on Apr 06, 2021 which showed areas of subarachnoid/PR contrast-enhancement over both convexity, left more than right, reactive enhancement in the setting of subarachnoid hemorrhage is favored -EEG on Apr 09, 2021 showed nonspecific cortical dysfunction of left hemisphere, maximum at the left temporal region -No driving until seizure free 6 months -Return back in 4 months or sooner if needed, call for seizures  Week Am/pm Lamotrigine 181m Depakote ER 5055m 1st week 0.5/1 2 tab every night  2nd week 1/1 1 tab every night  3rd week 1.5/1.5 0   SaButler DenmarkAGNP-C, DNP 12/18/2021, 4:08 PM [facility] [id number]SuF[place] [phone]

## 2021-12-18 NOTE — Patient Instructions (Addendum)
Week Am/pm Lamotrigine 100mg  Depakote ER 500mg   1st week 0.5/1 2 tab every night  2nd week 1/1 1 tab every night  3rd week 1.5/1.5 0    Check MRI of the brain   Call for any seizures  No driving until seizure free for 6 months

## 2021-12-19 ENCOUNTER — Telehealth: Payer: Self-pay | Admitting: Neurology

## 2021-12-19 NOTE — Telephone Encounter (Signed)
Medicare/medicaid order sent to [facility], [facility] they will reach out to the patient to schedule.  ?

## 2021-12-20 NOTE — Progress Notes (Addendum)
Chart reviewed,   I personally reviewed MRI of the brain with neuroradiologist Dr. Leta Baptist, there was no significant change compared to previous scan, no gradient echo to suggest hemosiderin deposit  Continued significant contrast enhancement at the left frontal leptomeningeal, mild involvement in the right frontal, bilateral parietal region, associated T2/FLAIR hyperintensity in the sulcus, juxtacortical and periventricular regions.  Differentiation diagnosis favor autoimmune, inflammatory, or granulomatous etiology,  Patient continues to have recurrent seizure taking lamotrigine 100 twice a day, suggestive of active process  He is on higher dose of lamotrigine 150 mg twice a day now, we will repeat MRI brain with and without contrast in 6 months,  May consider potential repeat lumbar puncture, or even parietal region biopsy

## 2021-12-23 ENCOUNTER — Ambulatory Visit: Payer: Medicare Other | Admitting: Neurology

## 2021-12-24 ENCOUNTER — Ambulatory Visit: Payer: Medicare Other | Admitting: Neurology

## 2021-12-26 ENCOUNTER — Ambulatory Visit
Admission: RE | Admit: 2021-12-26 | Discharge: 2021-12-26 | Disposition: A | Discharge status: Home / Self Care | Payer: Medicare Other | Source: Ambulatory Visit | Attending: Neurology | Admitting: Neurology

## 2021-12-26 ENCOUNTER — Other Ambulatory Visit: Payer: Self-pay

## 2021-12-26 DIAGNOSIS — G40209 Localization-related (focal) (partial) symptomatic epilepsy and epileptic syndromes with complex partial seizures, not intractable, without status epilepticus: Secondary | ICD-10-CM

## 2021-12-26 MED ORDER — GADOBENATE DIMEGLUMINE 529 MG/ML IV SOLN
19.0000 mL | Freq: Once | INTRAVENOUS | Status: AC | PRN
  Administered 2021-12-26: 19 mL via INTRAVENOUS

## 2021-12-27 ENCOUNTER — Ambulatory Visit: Payer: Medicare Other | Admitting: Oncology

## 2021-12-30 ENCOUNTER — Inpatient Hospital Stay: Payer: Medicare Other | Attending: Oncology | Admitting: Oncology

## 2021-12-30 ENCOUNTER — Other Ambulatory Visit: Payer: Self-pay

## 2021-12-30 ENCOUNTER — Encounter: Payer: Self-pay | Admitting: Oncology

## 2021-12-30 VITALS — BP 119/82 | HR 73 | Temp 98.2°F | Resp 16 | Wt 197.2 lb

## 2021-12-30 DIAGNOSIS — D72819 Decreased white blood cell count, unspecified: Secondary | ICD-10-CM | POA: Insufficient documentation

## 2021-12-30 DIAGNOSIS — Z79899 Other long term (current) drug therapy: Secondary | ICD-10-CM | POA: Diagnosis not present

## 2021-12-30 DIAGNOSIS — Z87891 Personal history of nicotine dependence: Secondary | ICD-10-CM | POA: Insufficient documentation

## 2021-12-30 DIAGNOSIS — D708 Other neutropenia: Secondary | ICD-10-CM | POA: Diagnosis not present

## 2021-12-30 NOTE — Progress Notes (Signed)
Pt wanting to know blood work results. No other concerns/complaints at this time

## 2021-12-30 NOTE — Progress Notes (Signed)
Hematology/Oncology Consult note  [phone] [phone]   Patient Care Team: Jearld Fenton, NP as PCP - General (Internal Medicine) Earlie Server, MD as Consulting Physician (Hematology and Oncology)  REFERRING PROVIDER: Jearld Fenton, NP  CHIEF COMPLAINTS/REASON FOR VISIT:  Evaluation of leukopenia  HISTORY OF PRESENTING ILLNESS:  Ralph Simpson is a 61 y.o. male who was seen in consultation at the request of Jearld Fenton, NP for evaluation of leukopenia.  11/13/2021, CBC showed white count of 2.6, ANC 1.2.  Normal hemoglobin and platelet counts.  Reviewed patient's previous history, patient has normal white count on 04/07/2021, after that he started to have decrease of total white count as well as neutropenia. Patient was hospitalized in May 2022 due to seizure activities.   Patient follows up with neurology Dr. Krista Blue.  Currently he is on Depakote as well as lamotrigine. Patient presented to emergency room on 08/04/2021.  Patient had a fever, body aches and headache.  ANC was decreased at 0.9.  Total white count 1.7.  Smear review showed leukopenia with absolute neutropenia.  Moderate left shift.  Interval history: Today he reports feeling well.  He had a seizure about 2 weeks ago and ended up in the emergency room.  He saw neurology on 12/18/2021 and he was told to taper off his Depakote and increase his Lamictal.  Denies injury from recent seizure.  Overall, he is feeling fine just frustrated with not being able to drive.  No recent infections.  No fevers.  Review of Systems  Constitutional: Negative.  Negative for appetite change, chills, fatigue and fever.  HENT:  Negative.  Negative for hearing loss, lump/mass, mouth sores and nosebleeds.   Eyes: Negative.  Negative for eye problems.  Respiratory:  Negative for cough, hemoptysis and shortness of breath.   Cardiovascular: Negative.  Negative for chest pain and leg swelling.  Gastrointestinal: Negative.   Negative for abdominal pain, blood in stool, constipation, diarrhea, nausea and vomiting.  Endocrine: Negative.  Negative for hot flashes.  Genitourinary: Negative.  Negative for bladder incontinence, difficulty urinating, dysuria, frequency and hematuria.   Musculoskeletal:  Negative for back pain, flank pain, gait problem and myalgias.  Skin: Negative.  Negative for itching and rash.  Neurological:  Positive for dizziness (Side effects from antiseizure medicine), extremity weakness (Using a cane) and seizures. Negative for gait problem, headaches, light-headedness and numbness.  Hematological: Negative.  Negative for adenopathy.  Psychiatric/Behavioral:  Negative for confusion. The patient is not nervous/anxious.    MEDICAL HISTORY:  Past Medical History:  Diagnosis Date   Allergy    Avascular necrosis of bones of both hips (HCC)    GERD (gastroesophageal reflux disease)    Migraine    Neutropenia (HCC)    Osteoarthritis of both hips    Plantar fasciitis    Seizures (HCC)    x 2 03/2021   Trochanteric bursitis of right hip     SURGICAL HISTORY: Past Surgical History:  Procedure Laterality Date   HERNIA REPAIR     HIP SURGERY     IR ANGIO INTRA EXTRACRAN SEL COM CAROTID INNOMINATE BILAT MOD SED  04/08/2021   IR ANGIO VERTEBRAL SEL SUBCLAVIAN INNOMINATE BILAT MOD SED  04/08/2021   IR US GUIDE VASC ACCESS RIGHT  04/08/2021    SOCIAL HISTORY: Social History   Socioeconomic History   Marital status: Single    Spouse name: Not on file   Number of children: 0   Years of education: Not on  file   Highest education level: 10th grade  Occupational History    Comment: NA  Tobacco Use   Smoking status: Former    Packs/day: 1.00    Types: Cigarettes    Passive exposure: Never   Smokeless tobacco: Former    Types: Chew   Tobacco comments:    quit 10-15 years ago  Vaping Use   Vaping Use: Never used  Substance and Sexual Activity   Alcohol use: Not Currently   Drug use: No    Sexual activity: Not Currently  Other Topics Concern   Not on file  Social History Narrative   Not on file   Social Determinants of Health   Financial Resource Strain: Not on file  Food Insecurity: Not on file  Transportation Needs: Not on file  Physical Activity: Not on file  Stress: Not on file  Social Connections: Not on file  Intimate Partner Violence: Not on file    FAMILY HISTORY: Family History  Problem Relation Age of Onset   Arthritis Mother    Stroke Mother    Diabetes Father    Heart disease Father    Hyperlipidemia Father    Hypertension Father    Kidney disease Maternal Grandfather    Colon cancer Neg Hx    Stomach cancer Neg Hx    Rectal cancer Neg Hx     ALLERGIES:  has No Known Allergies.  MEDICATIONS:  Current Outpatient Medications  Medication Sig Dispense Refill   lamoTRIgine (LAMICTAL) 100 MG tablet Take 1 tablet (100 mg total) by mouth 2 (two) times daily. 60 tablet 11   pantoprazole (PROTONIX) 40 MG tablet Take 1 tablet (40 mg total) by mouth daily. 30 tablet 0   Rimegepant Sulfate (NURTEC) 75 MG TBDP Take 1 tab at onset of migraine.  May repeat in 2 hrs, if needed.  Max dose: 2 tabs/day. This is a 30 day prescription. (Patient not taking: Reported on 12/30/2021) 12 tablet 11   No current facility-administered medications for this visit.     PHYSICAL EXAMINATION: ECOG PERFORMANCE STATUS: 0 - Asymptomatic Vitals:   12/30/21 1457  BP: 119/82  Pulse: 73  Resp: 16  Temp: 98.2 F (36.8 C)  SpO2: 99%   Filed Weights   12/30/21 1457  Weight: 197 lb 3.2 oz (89.4 kg)    Physical Exam Constitutional:      Appearance: Normal appearance.     Comments: Using a cane  HENT:     Head: Normocephalic and atraumatic.  Cardiovascular:     Rate and Rhythm: Normal rate and regular rhythm.     Heart sounds: Normal heart sounds. No murmur heard. Pulmonary:     Effort: Pulmonary effort is normal.     Breath sounds: Normal breath sounds. No wheezing.   Abdominal:     General: Bowel sounds are normal. There is no distension.     Palpations: Abdomen is soft.     Tenderness: There is no abdominal tenderness.  Musculoskeletal:        General: Normal range of motion.     Cervical back: Normal range of motion.  Skin:    General: Skin is warm and dry.     Findings: No rash.  Neurological:     Mental Status: He is alert and oriented to person, place, and time.  Psychiatric:        Judgment: Judgment normal.    RADIOGRAPHIC STUDIES: I have personally reviewed the radiological images as listed and agreed with the  findings in the report.  CMP Latest Ref Rng & Units 12/15/2021  Glucose 70 - 99 mg/dL 116(H)  BUN 8 - 23 mg/dL 27(H)  Creatinine 0.61 - 1.24 mg/dL 1.18  Sodium 135 - 145 mmol/L 138  Potassium 3.5 - 5.1 mmol/L 5.0  Chloride 98 - 111 mmol/L 104  CO2 22 - 32 mmol/L 27  Calcium 8.9 - 10.3 mg/dL 9.5  Total Protein 6.1 - 8.1 g/dL -  Total Bilirubin 0.2 - 1.2 mg/dL -  Alkaline Phos 44 - 121 IU/L -  AST 10 - 35 U/L -  ALT 9 - 46 U/L -   CBC Latest Ref Rng & Units 12/15/2021  WBC 4.0 - 10.5 K/uL 3.0(L)  Hemoglobin 13.0 - 17.0 g/dL 14.8  Hematocrit 39.0 - 52.0 % 43.6  Platelets 150 - 400 K/uL 201    LABORATORY DATA:  I have reviewed the data as listed Lab Results  Component Value Date   WBC 3.0 (L) 12/15/2021   HGB 14.8 12/15/2021   HCT 43.6 12/15/2021   MCV 86.3 12/15/2021   PLT 201 12/15/2021   Recent Labs    04/06/21 1810 04/06/21 1829 04/06/21 2311 04/07/21 0435 07/16/21 0813 08/09/21 1621 11/13/21 1016 12/15/21 1304  NA 137   < >  --  137 141 136 139 138  K 3.8   < >  --  3.6 4.5 4.5 5.0 5.0  CL 104   < >  --  106 102 102 103 104  CO2 26  --   --  '25 26 26 28 27  ' GLUCOSE 102*   < >  --  124* 173* 147* 158* 116*  BUN 18   < >  --  '14 20 16 22 ' 27*  CREATININE 0.85   < >  --  0.86 0.99 1.15 0.93 1.18  CALCIUM 9.3  --   --  8.7* 9.4 8.6 9.3 9.5  GFRNONAA >60  --   --  >60  --   --   --  >60  PROT 7.2  --    --   --  7.1 6.3 6.7  --   ALBUMIN 4.2  --  4.4  --  4.7  --   --   --   AST 20  --   --   --  13 48* 12  --   ALT 22  --   --   --  19 57* 16  --   ALKPHOS 61  --   --   --  66  --   --   --   BILITOT 0.6  --   --   --  0.3 0.5 0.4  --    < > = values in this interval not displayed.   Iron/TIBC/Ferritin/ %Sat No results found for: IRON, TIBC, FERRITIN, IRONPCTSAT   RADIOGRAPHIC STUDIES: I have personally reviewed the radiological images as listed and agreed with the findings in the report. MR BRAIN W WO CONTRAST  Result Date: 12/29/2021 [facility] NEUROIMAG[facility] REPORT STUDY DATE: 12/26/21 PATIENT NAME: Ralph Simpson DOB: 1961/09/14 MRN: [id number] ORDERING CLINICIAN: Suzzanne Cloud, NP CLINICAL HISTORY: 61 year old male with seizures. EXAM: MR BRAIN W WO CONTRAST TECHNIQUE: MRI of the brain with and without contrast was obtained utilizing 5 mm axial slices with T1, T2, T2 flair, SWI and diffusion weighted views.  T1 sagittal, T2 coronal and postcontrast views in the axial and coronal plane were obtained. CONTRAST: 32m multihance COMPARISON:  04/06/21 MRI IMAGING SITE: [facility] [place] FINDINGS: No abnormal lesions are seen on diffusion-weighted views to suggest acute ischemia. The cortical sulci, fissures and cisterns are normal in size and appearance. Lateral, third and fourth ventricle are normal in size and appearance. No extra-axial fluid collections are seen. No evidence of mass effect or midline shift. T2 flair hyperintensity noted within the left frontal sulci and juxtacortical regions associated with avid sulcal contrast enhancement. Subtle areas of sulcal enhancement also noted within the right frontal and bilateral parietal regions. T2FLAIR hyperintensity also noted in the subcortical and periventricular regions. On sagittal views the posterior fossa, pituitary gland and corpus callosum are unremarkable. No evidence of intracranial hemorrhage on  SWI views. The orbits and their contents, paranasal sinuses and calvarium are unremarkable.  Intracranial flow voids are present.   MRI brain (with and without) demonstrating: - Stable chronic leptomeningeal enhancement mainly in the left frontal region, but also slightly in the right frontal and bilateral parietal regions. There is associated T2FLAIR hyperintensity in the sulcal, juxtacortcal and periventricular regions. No SWI abnormalities to suggest hemosiderin and other mineralization. Would favor autoimmune, inflammatory or granulomatous etiologies. Overall no change from MRI on 04/06/21. INTERPRETING PHYSICIAN: Penni Bombard, MD Certified in Neurology, Neurophysiology and Neuroimaging [facility] [address], [place], [place] [address] [phone]   08/26/2021, ultrasound abdomen limited right upper quadrant showed cholelithiasis without sonographic evidence of acute cholecystitis.   ASSESSMENT & PLAN: Patient is here for follow-up for leukopenia discovered in May 2022.  Clinically he is stable.  Leukopenia thought to be due to antiseizure medications Depakote and Lamictal which can cause neutropenia.  Additional work-up was negative for vitamin deficiencies including folate and vitamin B12.  Kappa lambda light chain and multiple myeloma panel were both negative.  Flow cytometry and LDH was normal.  Blood smear was normal.  Labs from 12/15/2021 show an unremarkable BMP and an improvement in his white blood cell count from baseline 1.7-2 0.5-3.0 today.  ANC is 1400.  No recent infections.  Seizures-most recent seizure was about 2 weeks ago.  He is currently tapering off Depakote and has increased his Lamictal.   Disposition- Continue seizure medicines per neurology. Return to clinic in 6 months for follow-up with Dr. Tasia Catchings and lab work (CBC with differential).   I spent 25 minutes dedicated to the care of this patient (face-to-face and non-face-to-face) on the date  of the encounter to include what is described in the assessment and plan.  No diagnosis found.  No orders of the defined types were placed in this encounter.   All questions were answered. The patient knows to call the clinic with any problems questions or concerns.  Cc Jearld Fenton, NP   Faythe Casa, NP 12/30/2021 3:48 PM

## 2021-12-31 ENCOUNTER — Telehealth: Payer: Self-pay | Admitting: Neurology

## 2021-12-31 NOTE — Telephone Encounter (Signed)
I called the patient, reviewed MRI of the brain, is overall stable from May 2022, but some abnormality in the left frontal area. After reviewing with Dr. Krista Blue, will repeat imaging at next office visit around June 2022. He is doing well on his seizure medication switch, weaning off Depakote, increasing Lamictal.   IMPRESSION:    MRI brain (with and without) demonstrating: - Stable chronic leptomeningeal enhancement mainly in the left frontal region, but also slightly in the right frontal and bilateral parietal regions. There is associated T2FLAIR hyperintensity in the sulcal, juxtacortcal and periventricular regions. No SWI abnormalities to suggest hemosiderin and other mineralization. Would favor autoimmune, inflammatory or granulomatous etiologies. Overall no change from MRI on 04/06/21.

## 2022-01-02 LAB — HM DIABETES EYE EXAM

## 2022-02-17 ENCOUNTER — Ambulatory Visit: Payer: Medicare Other | Admitting: Internal Medicine

## 2022-02-21 ENCOUNTER — Other Ambulatory Visit: Payer: Self-pay | Admitting: Internal Medicine

## 2022-02-21 ENCOUNTER — Encounter: Payer: Self-pay | Admitting: Internal Medicine

## 2022-02-21 ENCOUNTER — Ambulatory Visit (INDEPENDENT_AMBULATORY_CARE_PROVIDER_SITE_OTHER): Payer: Medicare Other | Admitting: Internal Medicine

## 2022-02-21 VITALS — BP 116/68 | HR 68 | Temp 96.9°F | Wt 191.0 lb

## 2022-02-21 DIAGNOSIS — I700 Atherosclerosis of aorta: Secondary | ICD-10-CM | POA: Diagnosis not present

## 2022-02-21 DIAGNOSIS — E782 Mixed hyperlipidemia: Secondary | ICD-10-CM | POA: Diagnosis not present

## 2022-02-21 DIAGNOSIS — M549 Dorsalgia, unspecified: Secondary | ICD-10-CM

## 2022-02-21 DIAGNOSIS — E1165 Type 2 diabetes mellitus with hyperglycemia: Secondary | ICD-10-CM | POA: Diagnosis not present

## 2022-02-21 MED ORDER — TIZANIDINE HCL 2 MG PO CAPS: 2.0000 mg | ORAL_CAPSULE | Freq: Every evening | ORAL | 0 refills | Status: DC | PRN

## 2022-02-21 MED ORDER — RELION BLOOD GLUCOSE TEST VI STRP: ORAL_STRIP | 12 refills | Status: DC

## 2022-02-21 NOTE — Assessment & Plan Note (Signed)
C-Met and lipid profile today Encouraged him to consume low-fat diet 

## 2022-02-21 NOTE — Patient Instructions (Signed)
Heart-Healthy Eating Plan Heart-healthy meal planning includes: Eating less unhealthy fats. Eating more healthy fats. Making other changes in your diet. Talk with your doctor or a diet specialist (dietitian) to create an eating plan that is right for you. What is my plan? Your doctor may recommend an eating plan that includes: Total fat: ______% or less of total calories a day. Saturated fat: ______% or less of total calories a day. Cholesterol: less than _________mg a day. What are tips for following this plan? Cooking Avoid frying your food. Try to bake, boil, grill, or broil it instead. You can also reduce fat by: Removing the skin from poultry. Removing all visible fats from meats. Steaming vegetables in water or broth. Meal planning  At meals, divide your plate into four equal parts: Fill one-half of your plate with vegetables and green salads. Fill one-fourth of your plate with whole grains. Fill one-fourth of your plate with lean protein foods. Eat 4-5 servings of vegetables per day. A serving of vegetables is: 1 cup of raw or cooked vegetables. 2 cups of raw leafy greens. Eat 4-5 servings of fruit per day. A serving of fruit is: 1 medium whole fruit.  cup of dried fruit.  cup of fresh, frozen, or canned fruit.  cup of 100% fruit juice. Eat more foods that have soluble fiber. These are apples, broccoli, carrots, beans, peas, and barley. Try to get 20-30 g of fiber per day. Eat 4-5 servings of nuts, legumes, and seeds per week: 1 serving of dried beans or legumes equals  cup after being cooked. 1 serving of nuts is  cup. 1 serving of seeds equals 1 tablespoon. General information Eat more home-cooked food. Eat less restaurant, buffet, and fast food. Limit or avoid alcohol. Limit foods that are high in starch and sugar. Avoid fried foods. Lose weight if you are overweight. Keep track of how much salt (sodium) you eat. This is important if you have high blood  pressure. Ask your doctor to tell you more about this. Try to add vegetarian meals each week. Fats Choose healthy fats. These include olive oil and canola oil, flaxseeds, walnuts, almonds, and seeds. Eat more omega-3 fats. These include salmon, mackerel, sardines, tuna, flaxseed oil, and ground flaxseeds. Try to eat fish at least 2 times each week. Check food labels. Avoid foods with trans fats or high amounts of saturated fat. Limit saturated fats. These are often found in animal products, such as meats, butter, and cream. These are also found in plant foods, such as palm oil, palm kernel oil, and coconut oil. Avoid foods with partially hydrogenated oils in them. These have trans fats. Examples are stick margarine, some tub margarines, cookies, crackers, and other baked goods. What foods can I eat? Fruits All fresh, canned (in natural juice), or frozen fruits. Vegetables Fresh or frozen vegetables (raw, steamed, roasted, or grilled). Green salads. Grains Most grains. Choose whole wheat and whole grains most of the time. Rice and pasta, including brown rice and pastas made with whole wheat. Meats and other proteins Lean, well-trimmed beef, veal, pork, and lamb. Chicken and turkey without skin. All fish and shellfish. Wild duck, rabbit, pheasant, and venison. Egg whites or low-cholesterol egg substitutes. Dried beans, peas, lentils, and tofu. Seeds and most nuts. Dairy Low-fat or nonfat cheeses, including ricotta and mozzarella. Skim or 1% milk that is liquid, powdered, or evaporated. Buttermilk that is made with low-fat milk. Nonfat or low-fat yogurt. Fats and oils Non-hydrogenated (trans-free) margarines. Vegetable oils, including   soybean, sesame, sunflower, olive, peanut, safflower, corn, canola, and cottonseed. Salad dressings or mayonnaise made with a vegetable oil. Beverages Mineral water. Coffee and tea. Diet carbonated beverages. Sweets and desserts Sherbet, gelatin, and fruit ice.  Small amounts of dark chocolate. Limit all sweets and desserts. Seasonings and condiments All seasonings and condiments. The items listed above may not be a complete list of foods and drinks you can eat. Contact a dietitian for more options. What foods should I avoid? Fruits Canned fruit in heavy syrup. Fruit in cream or butter sauce. Fried fruit. Limit coconut. Vegetables Vegetables cooked in cheese, cream, or butter sauce. Fried vegetables. Grains Breads that are made with saturated or trans fats, oils, or whole milk. Croissants. Sweet rolls. Donuts. High-fat crackers, such as cheese crackers. Meats and other proteins Fatty meats, such as hot dogs, ribs, sausage, bacon, rib-eye roast or steak. High-fat deli meats, such as salami and bologna. Caviar. Domestic duck and goose. Organ meats, such as liver. Dairy Cream, sour cream, cream cheese, and creamed cottage cheese. Whole-milk cheeses. Whole or 2% milk that is liquid, evaporated, or condensed. Whole buttermilk. Cream sauce or high-fat cheese sauce. Yogurt that is made from whole milk. Fats and oils Meat fat, or shortening. Cocoa butter, hydrogenated oils, palm oil, coconut oil, palm kernel oil. Solid fats and shortenings, including bacon fat, salt pork, lard, and butter. Nondairy cream substitutes. Salad dressings with cheese or sour cream. Beverages Regular sodas and juice drinks with added sugar. Sweets and desserts Frosting. Pudding. Cookies. Cakes. Pies. Milk chocolate or white chocolate. Buttered syrups. Full-fat ice cream or ice cream drinks. The items listed above may not be a complete list of foods and drinks to avoid. Contact a dietitian for more information. Summary Heart-healthy meal planning includes eating less unhealthy fats, eating more healthy fats, and making other changes in your diet. Eat a balanced diet. This includes fruits and vegetables, low-fat or nonfat dairy, lean protein, nuts and legumes, whole grains, and  heart-healthy oils and fats. This information is not intended to replace advice given to you by your health care provider. Make sure you discuss any questions you have with your health care provider. Document Revised: 03/14/2021 Document Reviewed: 03/14/2021 Elsevier Patient Education  2022 Elsevier Inc.  

## 2022-02-21 NOTE — Progress Notes (Signed)
? ?Subjective:  ? ? Patient ID: Ralph Simpson, male    DOB: 1961-11-16, 61 y.o.   MRN: [id number] ? ?HPI ? ?Patient presents to clinic today for 70-monthfollow-up of HLD, DM2. ? ?DM2: His last A1c was 7.2%, 10/2021.  He is not taking any oral diabetic medication at this time.  His sugars range 140-180.  His last eye exam was 11/2021.  He checks his feet routinely.  Flu never.  Pneumovax never.  COVID never. ? ?HLD with Aortic Atherosclerosis: His last LDL was 141, triglycerides 111, 10/2019.  He is not taking any cholesterol-lowering medication at this time.  He tries to consume a low-fat diet. ? ?He also reports pain in his right upper back.  He noticed this about 1 month ago.  He describes the pain as achy and intermittently sharp.  The pain does not radiate.  The pain can be worse with movement.  He denies numbness, tingling or weakness of his right upper extremity.  He denies neck pain.  He has not noticed any rash in the area.  He denies any injury to the area.  He has tried Tylenol OTC with minimal relief of symptoms. ? ?Review of Systems ? ?   ?Past Medical History:  ?Diagnosis Date  ? Allergy   ? Avascular necrosis of bones of both hips ([facility]   ? GERD (gastroesophageal reflux disease)   ? Migraine   ? Neutropenia ([place]   ? Osteoarthritis of both hips   ? Plantar fasciitis   ? Seizures ([facility]   ? x 2 03/2021  ? Trochanteric bursitis of right hip   ? ? ?Current Outpatient Medications  ?Medication Sig Dispense Refill  ? lamoTRIgine (LAMICTAL) 100 MG tablet Take 1 tablet (100 mg total) by mouth 2 (two) times daily. 60 tablet 11  ? pantoprazole (PROTONIX) 40 MG tablet Take 1 tablet (40 mg total) by mouth daily. 30 tablet 0  ? Rimegepant Sulfate (NURTEC) 75 MG TBDP Take 1 tab at onset of migraine.  May repeat in 2 hrs, if needed.  Max dose: 2 tabs/day. This is a 30 day prescription. (Patient not taking: Reported on 12/30/2021) 12 tablet 11  ? ?No current facility-administered medications for this visit.  ? ? ?No Known  Allergies ? ?Family History  ?Problem Relation Age of Onset  ? Arthritis Mother   ? Stroke Mother   ? Diabetes Father   ? Heart disease Father   ? Hyperlipidemia Father   ? Hypertension Father   ? Kidney disease Maternal Grandfather   ? Colon cancer Neg Hx   ? Stomach cancer Neg Hx   ? Rectal cancer Neg Hx   ? ? ?Social History  ? ?Socioeconomic History  ? Marital status: Single  ?  Spouse name: Not on file  ? Number of children: 0  ? Years of education: Not on file  ? Highest education level: 10th grade  ?Occupational History  ?  Comment: NA  ?Tobacco Use  ? Smoking status: Former  ?  Packs/day: 1.00  ?  Types: Cigarettes  ?  Passive exposure: Never  ? Smokeless tobacco: Former  ?  Types: Chew  ? Tobacco comments:  ?  quit 10-15 years ago  ?Vaping Use  ? Vaping Use: Never used  ?Substance and Sexual Activity  ? Alcohol use: Not Currently  ? Drug use: No  ? Sexual activity: Not Currently  ?Other Topics Concern  ? Not on file  ?Social History Narrative  ? Not  on file  ? ?Social Determinants of Health  ? ?Financial Resource Strain: Not on file  ?Food Insecurity: Not on file  ?Transportation Needs: Not on file  ?Physical Activity: Not on file  ?Stress: Not on file  ?Social Connections: Not on file  ?Intimate Partner Violence: Not on file  ? ? ? ?Constitutional: Patient reports intermittent headaches.  Denies fever, malaise, fatigue, or abrupt weight changes.  ?HEENT: Denies eye pain, eye redness, ear pain, ringing in the ears, wax buildup, runny nose, nasal congestion, bloody nose, or sore throat. ?Respiratory: Denies difficulty breathing, shortness of breath, cough or sputum production.   ?Cardiovascular: Denies chest pain, chest tightness, palpitations or swelling in the hands or feet.  ?Gastrointestinal: Denies abdominal pain, bloating, constipation, diarrhea or blood in the stool.  ?GU: Patient reports urinary frequency.  Denies urgency, pain with urination, burning sensation, blood in urine, odor or  discharge. ?Skin: Denies redness, rashes, lesions or ulcercations.  ?MSK: Patient reports right upper back pain.  Denies decrease in range of motion, joint pain or joint swelling ?Neurological: Denies dizziness, difficulty with memory, difficulty with speech or problems with balance and coordination.  ? ? ?No other specific complaints in a complete review of systems (except as listed in HPI above). ? ?Objective:  ? Physical Exam ? ?BP 116/68 (BP Location: Right Arm, Patient Position: Sitting, Cuff Size: Large)   Pulse 68   Temp (!) 96.9 ?F (36.1 ?C) (Temporal)   Wt 191 lb (86.6 kg)   SpO2 100%   BMI 23.87 kg/m?  ? ?Wt Readings from Last 3 Encounters:  ?12/30/21 197 lb 3.2 oz (89.4 kg)  ?12/18/21 203 lb 8 oz (92.3 kg)  ?12/15/21 199 lb (90.3 kg)  ? ? ?General: Appears his stated age, well developed, well nourished in NAD. ?Skin: Warm, dry and intact. No ulcerations noted. ?HEENT: Head: normal shape and size; Eyes: sclera white and EOMs intact;  ?Cardiovascular: Normal rate and rhythm. S1,S2 noted.  No murmur, rubs or gallops noted. No JVD or BLE edema. No carotid bruits noted. ?Pulmonary/Chest: Normal effort and positive vesicular breath sounds. No respiratory distress. No wheezes, rales or ronchi noted.  ?Musculoskeletal: Normal flexion and rotation of the cervical spine.  Decreased extension of the cervical spine.  Normal internal and external rotation of the right shoulder.  No pain with palpation over the cervical spine or right shoulder.  Pain with palpation of the right subscapular region.  Strength 5/5 BUE.  Shoulder shrug equal.  Handgrips equal.  No difficulty with gait.  ?Neurological: Alert and oriented. ? ? ?BMET ?   ?Component Value Date/Time  ? NA 138 12/15/2021 1304  ? NA 141 07/16/2021 0813  ? K 5.0 12/15/2021 1304  ? CL 104 12/15/2021 1304  ? CO2 27 12/15/2021 1304  ? GLUCOSE 116 (H) 12/15/2021 1304  ? BUN 27 (H) 12/15/2021 1304  ? BUN 20 07/16/2021 0813  ? CREATININE 1.18 12/15/2021 1304  ?  CREATININE 0.93 11/13/2021 1016  ? CALCIUM 9.5 12/15/2021 1304  ? GFRNONAA >60 12/15/2021 1304  ? GFRAA >60 11/11/2015 1340  ? ? ?Lipid Panel  ?   ?Component Value Date/Time  ? CHOL 198 11/13/2021 1016  ? TRIG 111 11/13/2021 1016  ? HDL 34 (L) 11/13/2021 1016  ? CHOLHDL 5.8 (H) 11/13/2021 1016  ? VLDL 16.6 03/05/2020 1003  ? LDLCALC 141 (H) 11/13/2021 1016  ? ? ?CBC ?   ?Component Value Date/Time  ? WBC 3.0 (L) 12/15/2021 1304  ? RBC 5.05  12/15/2021 1304  ? HGB 14.8 12/15/2021 1304  ? HGB 15.0 07/16/2021 0813  ? HCT 43.6 12/15/2021 1304  ? HCT 44.2 07/16/2021 0813  ? PLT 201 12/15/2021 1304  ? MCV 86.3 12/15/2021 1304  ? MCV 86 07/16/2021 0813  ? MCH 29.3 12/15/2021 1304  ? MCHC 33.9 12/15/2021 1304  ? RDW 14.1 12/15/2021 1304  ? RDW 12.8 07/16/2021 0813  ? LYMPHSABS 1.2 12/15/2021 1304  ? LYMPHSABS 1.2 07/16/2021 0813  ? MONOABS 0.3 12/15/2021 1304  ? EOSABS 0.1 12/15/2021 1304  ? EOSABS 0.1 07/16/2021 0813  ? BASOSABS 0.0 12/15/2021 1304  ? BASOSABS 0.0 07/16/2021 0813  ? ? ?Hgb A1C ?Lab Results  ?Component Value Date  ? HGBA1C 7.2 (H) 11/13/2021  ? ? ? ? ? ? ? ?   ?Assessment & Plan:  ? ?Right Upper Back Pain: ? ?Seems muscular ?Encourage stretching ?Rx for Zanaflex 2 mg nightly as needed-sedation caution given ? ?RTC in 3 months for follow-up of chronic conditions ? ?Webb Silversmith, NP ? ?

## 2022-02-21 NOTE — Assessment & Plan Note (Signed)
C-Met and lipid profile today ?Encouraged him to consume a low-fat diet ?we will discuss starting baby aspirin at next visit ?

## 2022-02-21 NOTE — Assessment & Plan Note (Signed)
A1c and urine microalbumin today ?Encourage low-carb diet ?We will request copy of eye exam ?Encourage routine foot exams ?He declines flu, Pneumovax or COVID vaccines at this time ? ?

## 2022-02-22 LAB — LIPID PANEL
Cholesterol: 228 mg/dL — ABNORMAL HIGH (ref <200)
HDL: 40 mg/dL (ref >=40)
LDL Cholesterol (Calc): 167 mg/dL (calc) — ABNORMAL HIGH
Non-HDL Cholesterol (Calc): 188 mg/dL (calc) — ABNORMAL HIGH (ref <130)
Total CHOL/HDL Ratio: 5.7 (calc) — ABNORMAL HIGH (ref <5.0)
Triglycerides: 98 mg/dL (ref <150)

## 2022-02-22 LAB — MICROALBUMIN / CREATININE URINE RATIO
Creatinine, Urine: 80 mg/dL (ref 20–320)
Microalb Creat Ratio: 4 mcg/mg creat (ref <30)
Microalb, Ur: 0.3 mg/dL

## 2022-02-22 LAB — HEMOGLOBIN A1C
Hgb A1c MFr Bld: 6.1 % of total Hgb — ABNORMAL HIGH (ref <5.7)
Mean Plasma Glucose: 128 mg/dL
eAG (mmol/L): 7.1 mmol/L

## 2022-02-24 NOTE — Telephone Encounter (Signed)
Requested medication (s) are due for refill today: No ? ?Requested medication (s) are on the active medication list: Yes ? ?Last refill:  02/21/22 ? ?Future visit scheduled: No ? ?Notes to clinic:  See pharmacy requests. ? ? ? ?Requested Prescriptions  ?Pending Prescriptions Disp Refills  ? ONETOUCH VERIO test strip [Pharmacy Med Name: ONE TOUCH VERIO TEST STRIP] 100 strip 12  ?  Sig: USE AS INSTRUCTED  ?  ? Endocrinology: Diabetes - Testing Supplies Passed - 02/21/2022  3:20 PM  ?  ?  Passed - Valid encounter within last 12 months  ?  Recent Outpatient Visits   ? ?      ? 3 days ago Type 2 diabetes mellitus with hyperglycemia, without long-term current use of insulin ([place])  ? [facility] [place], [place] W, NP  ? 3 months ago Neutropenia, unspecified type [facility])  ? [facility] [place], [place] W, NP  ? 3 months ago Prediabetes  ? [facility] [place], [place] W, NP  ? 6 months ago Post-procedural fever  ? [facility] [place], [place] W, NP  ? 5 years ago URI with cough and congestion  ? [facility] [place], PA-C  ? ?  ?  ? ?  ?  ?  ? ?

## 2022-02-25 ENCOUNTER — Other Ambulatory Visit: Payer: Self-pay | Admitting: Internal Medicine

## 2022-02-25 ENCOUNTER — Telehealth: Payer: Self-pay

## 2022-02-25 MED ORDER — ATORVASTATIN CALCIUM 10 MG PO TABS
10.0000 mg | ORAL_TABLET | Freq: Every day | ORAL | 2 refills | Status: DC
Start: 2022-02-25 — End: 2022-06-05

## 2022-02-25 NOTE — Telephone Encounter (Signed)
Pt advised.  He agreed to start [facility].  He had a question about the baby aspirin.  He states he had a brain bleed in the past and wanted to make sure it was okay to start.  ? ? ?Thanks,  ? ?-Mickel Baas  ?

## 2022-02-25 NOTE — Telephone Encounter (Signed)
-----   Message from Jearld Fenton, NP sent at 02/24/2022 12:31 PM EDT ----- ?A1C down to 6.1%, this is great! Diabetes is not affecting kidneys. Cholesterol is worse. I would recommend cholesterol lowering medication to reduce risk of heart attack or stroke. He should also be taking baby ASA daily. Let me know if he is agreeable and I will send in. ?

## 2022-02-25 NOTE — Addendum Note (Signed)
Addended by: Jearld Fenton on: 02/25/2022 08:53 AM ? ? Modules accepted: Orders ? ?

## 2022-02-25 NOTE — Telephone Encounter (Signed)
Atorvastatin sent to pharmacy.  He should not be taking aspirin given his history of his brain bleed, thank him for reminding me of this.  Please add aspirin to his allergy list and listed reaction as brain bleed ?

## 2022-02-26 ENCOUNTER — Telehealth: Payer: Self-pay

## 2022-02-26 MED ORDER — ONETOUCH VERIO VI STRP: ORAL_STRIP | 1 refills | Status: DC

## 2022-02-26 NOTE — Telephone Encounter (Signed)
Copied from [place] [id number]. Topic: General - Other ?>> Feb 26, 2022 11:37 AM Yvette Rack wrote: ?Reason for CRM: Pt stated his insurance will only pay for him to use 1 test strip daily so he will need a new Rx sent to his pharmacy ?

## 2022-02-26 NOTE — Telephone Encounter (Signed)
Requested medication (s) are due for refill today: {yes ? ?Requested medication (s) are on the active medication list: yes ? ?Last refill:  02/25/22 ? ?Future visit scheduled: yes ? ?Notes to clinic:  Unable to refill per protocol due to pharmacy requesting alternate Rx. ? ? ?  ?Requested Prescriptions  ?Pending Prescriptions Disp Refills  ? ONETOUCH VERIO test strip [Pharmacy Med Name: ONE TOUCH VERIO TEST STRIP] 100 strip 12  ?  Sig: Check blood sugar using 1 strip 3 x daily for blood sugar monitoring related to DM 2 (E11.9)  ?  ? Endocrinology: Diabetes - Testing Supplies Passed - 02/25/2022  6:08 PM  ?  ?  Passed - Valid encounter within last 12 months  ?  Recent Outpatient Visits   ? ?      ? 5 days ago Type 2 diabetes mellitus with hyperglycemia, without long-term current use of insulin ([place])  ? [facility] [place], [place] W, NP  ? 3 months ago Neutropenia, unspecified type [facility])  ? [facility] [place], [place] W, NP  ? 3 months ago Prediabetes  ? [facility] [place], [place] W, NP  ? 6 months ago Post-procedural fever  ? [facility] [place], [place] W, NP  ? 5 years ago URI with cough and congestion  ? [facility] [place], PA-C  ? ?  ?  ? ?  ?  ?  ? ? ?

## 2022-02-26 NOTE — Telephone Encounter (Signed)
Tried calling; pt's voicemail is not set up.  PEC please advise pt if he calls back.  ? ?Thanks,  ? ?-Mickel Baas  ?

## 2022-02-26 NOTE — Telephone Encounter (Signed)
RX sent for 1 test strip daily.  ? ?Thanks,  ? ?-Mickel Baas  ?

## 2022-03-06 NOTE — Telephone Encounter (Signed)
Tried to call patient. One number in chart is disconnected, and the other number is "invalid." Will mail letter out to patient with this information from [place]. ?

## 2022-03-06 NOTE — Telephone Encounter (Signed)
Patient called back was informed that letter was mailed out.  ?

## 2022-04-21 ENCOUNTER — Other Ambulatory Visit: Payer: Self-pay | Admitting: Internal Medicine

## 2022-04-21 ENCOUNTER — Telehealth: Payer: Self-pay | Admitting: Neurology

## 2022-04-21 MED ORDER — LAMOTRIGINE 100 MG PO TABS: ORAL_TABLET | ORAL | 0 refills | Status: DC

## 2022-04-21 NOTE — Telephone Encounter (Signed)
I spoke to the patient. He confirmed he is off divalproex and is taking lamotrigine '100mg'$ , 1.5 tabs BID. Pending appt 04/28/22. Doing well on current doses. Denies any seizure like activity.

## 2022-04-21 NOTE — Telephone Encounter (Signed)
Plan from last office visit:

## 2022-04-21 NOTE — Telephone Encounter (Signed)
Pt is asking that a [id number], stating his lamoTRIgine (LAMICTAL) 100 MG tablet, is to be for 3 a day and not 2 a day

## 2022-04-21 NOTE — Telephone Encounter (Signed)
Medication Refill - Medication: Onetouch Verio touch strips and machine  Has the patient contacted their pharmacy? No.  Preferred Pharmacy (with phone number or street name): [id number] [place] [address] [id number] Phone:  [phone] Fax:  [phone] Has the patient been seen for an appointment in the last year OR does the patient have an upcoming appointment? Yes.

## 2022-04-22 MED ORDER — ONETOUCH VERIO VI STRP: ORAL_STRIP | 10 refills | Status: DC

## 2022-04-22 NOTE — Telephone Encounter (Signed)
Pt also needs Delica 33 gauge Lancets + One Touch Verio Reflect machine. Must include diagnosis codes for insurance approval.  Same pharmacy listed below.

## 2022-04-22 NOTE — Telephone Encounter (Signed)
Requested Prescriptions  Pending Prescriptions Disp Refills  . glucose blood (ONETOUCH VERIO) test strip 100 strip 1    Sig: Check blood sugar using 1 strip daily for blood sugar monitoring related to DM 2 (E11.9)     Endocrinology: Diabetes - Testing Supplies Passed - 04/21/2022 12:07 PM      Passed - Valid encounter within last 12 months    Recent Outpatient Visits          2 months ago Type 2 diabetes mellitus with hyperglycemia, without long-term current use of insulin [facility])   [facility] [place], Coralie Keens, NP   5 months ago Neutropenia, unspecified type [facility])   [facility], Coralie Keens, NP   5 months ago Prediabetes   [facility] [place], Coralie Keens, NP   8 months ago Post-procedural fever   [facility] [place], [place] W, NP   5 years ago URI with cough and congestion   [facility] [place], PA-C

## 2022-04-28 ENCOUNTER — Encounter: Payer: Self-pay | Admitting: Neurology

## 2022-04-28 ENCOUNTER — Ambulatory Visit (INDEPENDENT_AMBULATORY_CARE_PROVIDER_SITE_OTHER): Payer: Medicare Other | Admitting: Neurology

## 2022-04-28 VITALS — BP 97/65 | HR 82 | Ht 75.0 in | Wt 192.0 lb

## 2022-04-28 DIAGNOSIS — R9389 Abnormal findings on diagnostic imaging of other specified body structures: Secondary | ICD-10-CM

## 2022-04-28 DIAGNOSIS — G43709 Chronic migraine without aura, not intractable, without status migrainosus: Secondary | ICD-10-CM | POA: Diagnosis not present

## 2022-04-28 DIAGNOSIS — G40209 Localization-related (focal) (partial) symptomatic epilepsy and epileptic syndromes with complex partial seizures, not intractable, without status epilepticus: Secondary | ICD-10-CM | POA: Diagnosis not present

## 2022-04-28 DIAGNOSIS — R4189 Other symptoms and signs involving cognitive functions and awareness: Secondary | ICD-10-CM | POA: Insufficient documentation

## 2022-04-28 DIAGNOSIS — Z114 Encounter for screening for human immunodeficiency virus [HIV]: Secondary | ICD-10-CM

## 2022-04-28 DIAGNOSIS — R9089 Other abnormal findings on diagnostic imaging of central nervous system: Secondary | ICD-10-CM | POA: Diagnosis not present

## 2022-04-28 DIAGNOSIS — R799 Abnormal finding of blood chemistry, unspecified: Secondary | ICD-10-CM

## 2022-04-28 MED ORDER — LAMOTRIGINE 100 MG PO TABS: ORAL_TABLET | ORAL | 3 refills | Status: DC

## 2022-04-28 NOTE — Progress Notes (Signed)
ASSESSMENT AND PLAN 61 y.o. year old male  Complex partial seizures in September 2021, Long history of chronic migraine headaches Abnormal MRI of the brain,  He was initially diagnosed with probable subarachnoid hemorrhage, but on repeat MRI of the brain with without contrast in February 2023, the abnormal findings does not support hemorrhage, there is definite left frontal chronic leptomeningeal enhancement, also slightly involving right frontal bilateral parietal region, this raise the possibility of autoimmune, inflammation or granulomatous etiology  Repeat MRI of the brain with without contrast  Extensive laboratory evaluation to rule out treatable etiology  Lamotrigine 100 mg 1 in the morning 2 at night  Return to clinic in 3 months with nurse practitioner,  Depend on the findings, may consider lumbar puncture, large-volume cytology evaluation for potential etiology, even consider biopsy of left frontal regions,   DIAGNOSTIC DATA (LABS, IMAGING, TESTING) - I reviewed patient records, labs, notes, testing and imaging myself where available.   MRI brain (with and without) demonstrating: - Stable chronic leptomeningeal enhancement mainly in the left frontal region, but also slightly in the right frontal and bilateral parietal regions. There is associated T2FLAIR hyperintensity in the sulcal, juxtacortcal and periventricular regions. No SWI abnormalities to suggest hemosiderin and other mineralization. Would favor autoimmune, inflammatory or granulomatous etiologies. Overall no change from MRI on 04/06/21  Angiographically no evidence of arteriovenous malformation, dural AV fistula, aneurysms, dissections, or of abnormal filling defects.   Venous outflow grossly intact. Predominant outflow of both cerebral hemispheres is via the right transverse sinus and the right sigmoid sinus.   Spinal fluid testing in May 2022, elevated WBC 2225, RBC 2, total protein 51 HISTORY  Ralph Simpson  is a 61 year old male, seen in request by Dr. Roland Rack to follow-up hospital discharge on Apr 09, 2021, he is accompanied by his brother at today's visit on Apr 12, 2021   I reviewed and summarized the referring note. PMHx. He has past medical history of bilateral hip avascular necrosis, status post bilateral fibular grafting, chronic low back pain, on disability   He had long history of chronic migraine headaches, his typical migraine are severe holoacranial pounding headache with light, noise sensitivity, it can happen 1-3 times a week, he used to take [facility] powder, now he take Advil, at least 2 doses of Excedrin Migraine with each episode of headache, he also has visual aura prior to his severe migraine headaches, he see tunnel vision, lose vision before headaches,   He reported frequent headaches over the past couple years, had his first right arm on controllable right arm jerking movement in September 2021, only last for few minutes, is associated with his severe headache,   He had similar episode in March 2022, he again presenting with severe migraine headaches, with uncontrollable right arm jerking, word finding difficulties, was brought in by his girlfriend to the emergency room, while in the waiting area, he had another episode   CT of the head showed left frontal subarachnoid hemorrhage   Which is confirmed by MRI of the brain with without contrast, area of subarachnoid/p.o. contrast enhancement over both convexities, left more than right, reactive enhancement in the setting of subarachnoid hemorrhage is favored   He had a CT angiogram of head and neck was normal   Also four-vessel angiogram on Apr 10, 2021 showed no evidence of vascular malformation   Extensive laboratory evaluations showed no significant abnormality, other than slight elevated scleroderma IgG antibody   Spinal fluid testing showed  significantly elevated RBC 2225 at tube 4, slight elevated total protein of  51, with otherwise no significant abnormalities   He is discharge with Depakote DR 500 mg twice a day, overall tolerating medication, complains of some fatigue, he had long history of difficulty sleeping due to chronic hip pain, tends to take frequent naps in his chair instead of lying in bed sleep   UPDATE July 16 2021: He had recurrent partial seizure on July 6 and July 12, transient right arm shaking, lasting for few minutes, no dysarthria no confusion, he has some side effect with Depakote ER 500 mg 2 tablets every night, complains of dizziness, but still taking it,  Gait abnormality due to hip pain, pending upper and lower GI study   Extensive laboratory evaluation in May 2022 showed normal or negative ESR C-reactive protein ANA hypercoagulable panel, Lyme titer, low titer scleroderma antibody, angiotension enzyme, rheumatoid factor,  UPDATE April 28 2022: He is accompanied by his significant other [name] at today's visit, who has known him for more than 20 years, reported he did have word finding difficulty, mild short-term memory loss over the past few months, MoCA examination 23/30,  He was disabled as a Nature conservation officer due to bilateral hip avascular necrosis,  He has no recurrent seizure since last visit, taking lamotrigine 100 mg 3 tablets at nighttime, sometimes forgot taking medications, continue has intermittent headache, only has to take Roselyn Meier couple times over the last 4 months, worked well for him  He used to enjoy fishing, but now with his seizure spells, he become more sedentary, worried about recurrent spells, spent most of the time watching TV  He was seen by oncologist NP for leukopenia, thought due to epileptic medications, Depakote has tapered off   We personally reviewed MRI of the brain with without contrast in February 2023, compared to previous MRI in May 2022, there was no significant change, stable chronic leptomeningeal enhancement, mainly in the left  frontal region, but also slightly in the right frontal and bilateral parietal region, there is associated T2/FLAIR hyperintensity in the sulcal, juxtacortical, and periventricular regions, no SWI abnormality to suggest hemosiderin, above findings argue against the possibility of subarachnoid hemorrhage, despite previous spinal fluid testing in May 2022, showed pink spinal fluid, hazy, RBC was 2225, WBC was 2, could not rule out possibility of bloody tap, total protein was mildly elevated 51 at that time  EEG in May showed left cortical dysfunction, intermittent slow, maximal at the left temporal region, repeat EEG in June 2023 was normal  PHYSICAL EXAM  Vitals:   04/28/22 1507  BP: 97/65  Pulse: 82  Weight: 192 lb (87.1 kg)  Height: _0  (1.905 m)   Body mass index is 24 kg/m.   PHYSICAL EXAMNIATION:  Gen: NAD, conversant, well nourised, well groomed                     Cardiovascular: Regular rate rhythm, no peripheral edema, warm, nontender. Eyes: Conjunctivae clear without exudates or hemorrhage Neck: Supple, no carotid bruits. Pulmonary: Clear to auscultation bilaterally   NEUROLOGICAL EXAM:  MENTAL STATUS: Speech/cognition: Awake, alert oriented to history taking and casual conversation    04/28/2022    3:00 PM  Montreal Cognitive Assessment   Visuospatial/ Executive (0/5) 4  Naming (0/3) 3  Attention: Read list of digits (0/2) 2  Attention: Read list of letters (0/1) 1  Attention: Serial 7 subtraction starting at 100 (0/3) 3  Language: Repeat phrase (0/2) 2  Language : Fluency (0/1) 0  Abstraction (0/2) 1  Delayed Recall (0/5) 1  Orientation (0/6) 6  Total 23     CRANIAL NERVES: CN II: Visual fields are full to confrontation.  Pupils are round equal and briskly reactive to light. CN III, IV, VI: extraocular movement are normal. No ptosis. CN V: Facial sensation is intact to pinprick in all 3 divisions bilaterally. Corneal responses are intact.  CN VII: Face is  symmetric with normal eye closure and smile. CN VIII: Hearing is normal to casual conversation CN IX, X: Palate elevates symmetrically. Phonation is normal. CN XI: Head turning and shoulder shrug are intact   MOTOR: Mild fixation of right upper extremity on rapid rotating movement, right leg drift  REFLEXES: Hypoactive and symmetric,  SENSORY: Intact to light touch, no extinction  COORDINATION: Rapid alternating movements and fine finger movements are intact. There is no dysmetria on finger-to-nose and heel-knee-shin.    GAIT/STANCE: Need push-up to get up from seated position, unsteady, antalgic   REVIEW OF SYSTEMS: Out of a complete 14 system review of symptoms, the patient complains only of the following symptoms, and all other reviewed systems are negative.  See HPI  ALLERGIES: Allergies  Allergen Reactions   Aspirin     Causes brain bleed     HOME MEDICATIONS: Outpatient Medications Prior to Visit  Medication Sig Dispense Refill   atorvastatin (LIPITOR) 10 MG tablet Take 1 tablet (10 mg total) by mouth daily. 30 tablet 2   glucose blood (ONETOUCH VERIO) test strip Check blood sugar using 1 strip daily for blood sugar monitoring related to DM 2 (E11.9) 100 strip 10   lamoTRIgine (LAMICTAL) 100 MG tablet Take 1.5 tablets twice daily. 270 tablet 0   Rimegepant Sulfate (NURTEC) 75 MG TBDP Take 1 tab at onset of migraine.  May repeat in 2 hrs, if needed.  Max dose: 2 tabs/day. This is a 30 day prescription. 12 tablet 11   tizanidine (ZANAFLEX) 2 MG capsule Take 1 capsule (2 mg total) by mouth at bedtime as needed for muscle spasms. 15 capsule 0   pantoprazole (PROTONIX) 40 MG tablet Take 1 tablet (40 mg total) by mouth daily. 30 tablet 0   No facility-administered medications prior to visit.    PAST MEDICAL HISTORY: Past Medical History:  Diagnosis Date   Allergy    Avascular necrosis of bones of both hips (HCC)    GERD (gastroesophageal reflux disease)    Migraine     Neutropenia (HCC)    Osteoarthritis of both hips    Plantar fasciitis    Seizures (HCC)    x 2 03/2021   Trochanteric bursitis of right hip     PAST SURGICAL HISTORY: Past Surgical History:  Procedure Laterality Date   HERNIA REPAIR     HIP SURGERY     IR ANGIO INTRA EXTRACRAN SEL COM CAROTID INNOMINATE BILAT MOD SED  04/08/2021   IR ANGIO VERTEBRAL SEL SUBCLAVIAN INNOMINATE BILAT MOD SED  04/08/2021   IR US GUIDE VASC ACCESS RIGHT  04/08/2021    FAMILY HISTORY: Family History  Problem Relation Age of Onset   Arthritis Mother    Stroke Mother    Diabetes Father    Heart disease Father    Hyperlipidemia Father    Hypertension Father    Kidney disease Maternal Grandfather    Colon cancer Neg Hx    Stomach cancer Neg Hx    Rectal cancer Neg Hx     SOCIAL  HISTORY: Social History   Socioeconomic History   Marital status: Single    Spouse name: Not on file   Number of children: 0   Years of education: Not on file   Highest education level: 10th grade  Occupational History    Comment: NA  Tobacco Use   Smoking status: Former    Packs/day: 1.00    Types: Cigarettes    Passive exposure: Never   Smokeless tobacco: Former    Types: Chew   Tobacco comments:    quit 10-15 years ago  Vaping Use   Vaping Use: Never used  Substance and Sexual Activity   Alcohol use: Not Currently   Drug use: No   Sexual activity: Not Currently  Other Topics Concern   Not on file  Social History Narrative   Not on file   Social Determinants of Health   Financial Resource Strain: Not on file  Food Insecurity: Not on file  Transportation Needs: Not on file  Physical Activity: Not on file  Stress: Not on file  Social Connections: Not on file  Intimate Partner Violence: Not on file   Marcial Pacas, M.D. Ph.D.  [facility] [place],  [address] Phone: [phone] Fax:      [phone]   Total time spent reviewing the chart, obtaining history,  examined patient, ordering tests, documentation, consultations and family, care coordination was 60 minutes

## 2022-04-29 ENCOUNTER — Telehealth: Payer: Self-pay | Admitting: Neurology

## 2022-04-29 NOTE — Telephone Encounter (Signed)
medicare/medicaid NPR sent to GI 

## 2022-04-30 ENCOUNTER — Other Ambulatory Visit: Payer: Self-pay

## 2022-05-01 LAB — C-REACTIVE PROTEIN: CRP: <1 mg/L (ref 0–10)

## 2022-05-01 LAB — CBC WITH DIFFERENTIAL/PLATELET
Basophils Absolute: 0 10*3/uL (ref 0.0–0.2)
Basos: 1 %
EOS (ABSOLUTE): 0.1 10*3/uL (ref 0.0–0.4)
Eos: 2 %
Hematocrit: 43 % (ref 37.5–51.0)
Hemoglobin: 14.8 g/dL (ref 13.0–17.7)
Immature Grans (Abs): 0 10*3/uL (ref 0.0–0.1)
Immature Granulocytes: 0 %
Lymphocytes Absolute: 1.1 10*3/uL (ref 0.7–3.1)
Lymphs: 29 %
MCH: 29.7 pg (ref 26.6–33.0)
MCHC: 34.4 g/dL (ref 31.5–35.7)
MCV: 86 fL (ref 79–97)
Monocytes Absolute: 0.4 10*3/uL (ref 0.1–0.9)
Monocytes: 9 %
Neutrophils Absolute: 2.2 10*3/uL (ref 1.4–7.0)
Neutrophils: 59 %
Platelets: 211 10*3/uL (ref 150–450)
RBC: 4.99 x10E6/uL (ref 4.14–5.80)
RDW: 12.6 % (ref 11.6–15.4)
WBC: 3.8 10*3/uL (ref 3.4–10.8)

## 2022-05-01 LAB — HIV ANTIBODY (ROUTINE TESTING W REFLEX): HIV Screen 4th Generation wRfx: NONREACTIVE

## 2022-05-01 LAB — TSH: TSH: 1.13 u[IU]/mL (ref 0.450–4.500)

## 2022-05-01 LAB — LYME DISEASE SEROLOGY W/REFLEX: Lyme Total Antibody EIA: NEGATIVE

## 2022-05-01 LAB — FERRITIN: Ferritin: 222 ng/mL (ref 30–400)

## 2022-05-01 LAB — ANCA PROFILE
Anti-MPO Antibodies: <0.2 units (ref 0.0–0.9)
Anti-PR3 Antibodies: <0.2 units (ref 0.0–0.9)
Atypical pANCA: <1:20 {titer}
C-ANCA: <1:20 {titer}
P-ANCA: <1:20 {titer}

## 2022-05-01 LAB — LAMOTRIGINE LEVEL: Lamotrigine Lvl: 5.7 ug/mL (ref 2.0–20.0)

## 2022-05-01 LAB — VITAMIN B12: Vitamin B-12: 1717 pg/mL — ABNORMAL HIGH (ref 232–1245)

## 2022-05-01 LAB — VALPROIC ACID LEVEL: Valproic Acid Lvl: <4 ug/mL — ABNORMAL LOW (ref 50–100)

## 2022-05-01 LAB — SEDIMENTATION RATE: Sed Rate: 3 mm/hr (ref 0–30)

## 2022-05-01 LAB — FOLATE: Folate: 16.6 ng/mL (ref >3.0)

## 2022-05-01 LAB — RPR: RPR Ser Ql: NONREACTIVE

## 2022-05-01 LAB — ANGIOTENSIN CONVERTING ENZYME: Angio Convert Enzyme: 45 U/L (ref 14–82)

## 2022-05-01 LAB — ANA W/REFLEX IF POSITIVE: Anti Nuclear Antibody (ANA): NEGATIVE

## 2022-05-01 LAB — HGB A1C W/O EAG: Hgb A1c MFr Bld: 6 % — ABNORMAL HIGH (ref 4.8–5.6)

## 2022-05-01 LAB — CK: Total CK: 61 U/L (ref 41–331)

## 2022-05-01 MED ORDER — ONETOUCH VERIO VI STRP: ORAL_STRIP | 1 refills | Status: DC

## 2022-05-01 MED ORDER — ONETOUCH DELICA LANCETS 33G MISC: 1 refills | Status: DC

## 2022-05-01 MED ORDER — ONETOUCH VERIO W/DEVICE KIT: PACK | 0 refills | Status: DC

## 2022-05-07 ENCOUNTER — Ambulatory Visit
Admission: RE | Admit: 2022-05-07 | Discharge: 2022-05-07 | Disposition: A | Discharge status: Home / Self Care | Payer: Medicare Other | Source: Ambulatory Visit | Attending: Neurology | Admitting: Neurology

## 2022-05-07 DIAGNOSIS — G43709 Chronic migraine without aura, not intractable, without status migrainosus: Secondary | ICD-10-CM

## 2022-05-07 DIAGNOSIS — G40209 Localization-related (focal) (partial) symptomatic epilepsy and epileptic syndromes with complex partial seizures, not intractable, without status epilepticus: Secondary | ICD-10-CM

## 2022-05-07 IMAGING — MR MR HEAD WO/W CM
17 series · 48 of 48 positions shown · IV contrast (multihance)
Comparison: MR head without and with contrast [DATE] and
[DATE]

CLINICAL DATA: Seizure, focal. Patient repeatedly fell asleep and
was moving during the exam.

EXAM:
MRI HEAD WITHOUT AND WITH CONTRAST
TECHNIQUE: Multiplanar, multiecho pulse sequences of the brain and surrounding
structures were obtained without and with intravenous contrast.
CONTRAST:  18mL MULTIHANCE GADOBENATE DIMEGLUMINE 529 MG/ML IV SOLN

[Series 5: T1 · sagittal · 4.0mm · 0.75mm/px · 2 of 31 slices shown (1 of 4)]
[im 1/31]
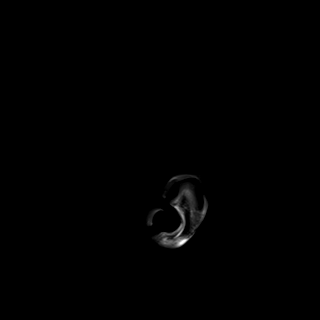
[im 31/31]
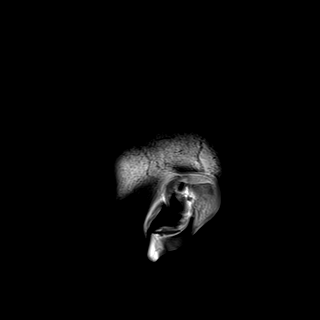

[Series 6: DWI · axial · 3.0mm · 0.94mm/px · z∈[-69,+88]mm · 7 of 179 slices shown (1 of 3)]
[im 1/179]
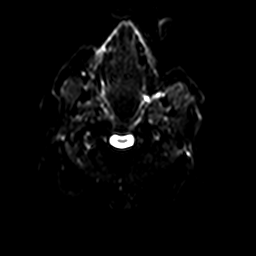
[im 30/179]
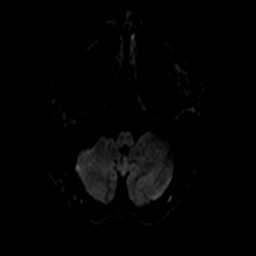
[im 60/179]
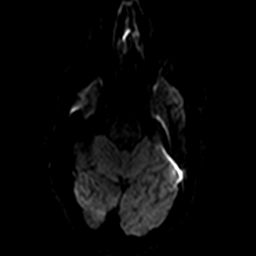
[im 90/179]
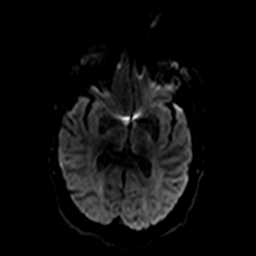
[im 119/179]
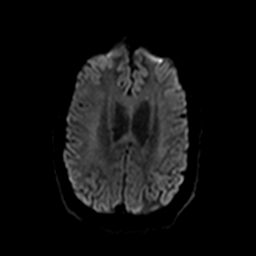
[im 149/179]
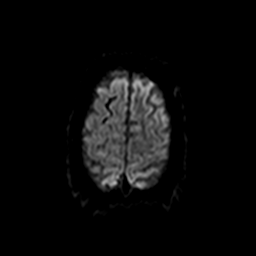
[im 179/179]
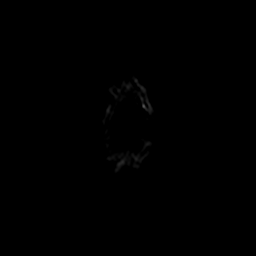

[Series 7: ax dwi_tracew · axial · 3.0mm · 0.94mm/px · z∈[-69,+88]mm · 4 of 90 slices shown]
[im 1/90]
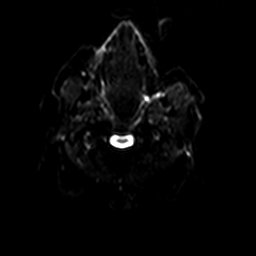
[im 30/90]
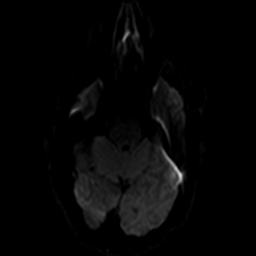
[im 60/90]
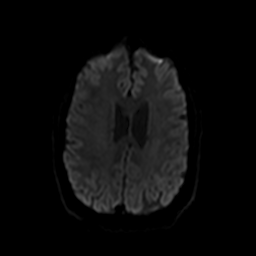
[im 90/90]
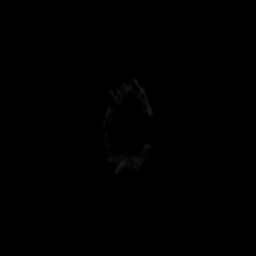

[Series 8: ax dwi_adc · axial · 3.0mm · 0.94mm/px · z∈[-69,+88]mm · 2 of 45 slices shown]
[im 1/45]
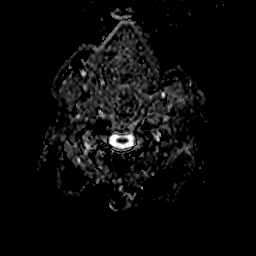
[im 45/45]
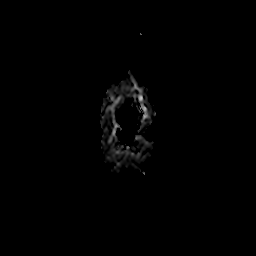

[Series 9: DWI · coronal · 5.0mm · 1.44mm/px · 3 of 72 slices shown (2 of 3)]
[im 1/72]
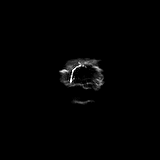
[im 36/72]
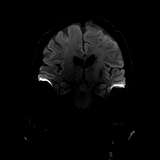
[im 72/72]
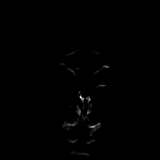

[Series 10: DWI · coronal · 5.0mm · 1.44mm/px · 2 of 36 slices shown (3 of 3)]
[im 1/36]
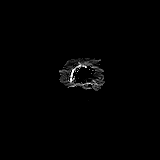
[im 36/36]
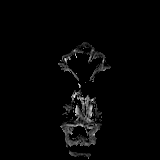

[Series 11: T2 · axial · 4.0mm · 0.36mm/px · 1 of 32 slices shown (1 of 2)]
[im 1/32]
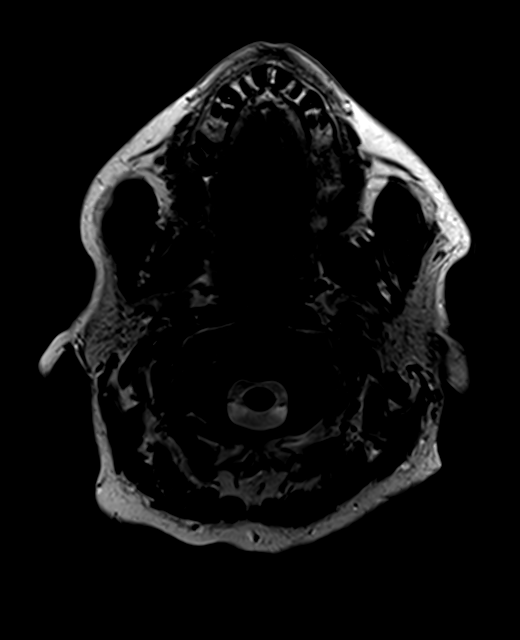

[Series 12: FLAIR · axial · 3.0mm · 0.72mm/px · 1 of 26 slices shown (1 of 3)]
[im 1/26]
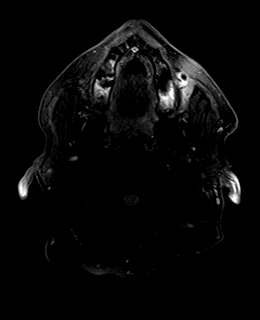

[Series 14: swi_images · axial · 3.0mm · 0.90mm/px · z∈[-71,+114]mm · 3 of 64 slices shown]
[im 1/64]
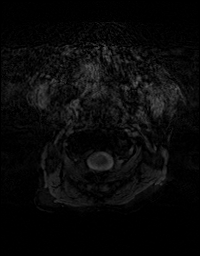
[im 32/64]
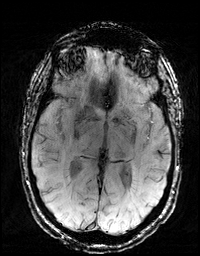
[im 64/64]
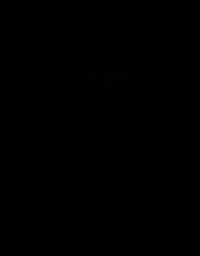

[Series 15: T1 · axial · 1.0mm · 0.90mm/px · z∈[-56,+100]mm · 7 of 160 slices shown (2 of 4)]
[im 1/160]
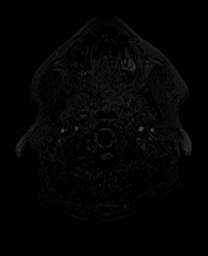
[im 27/160]
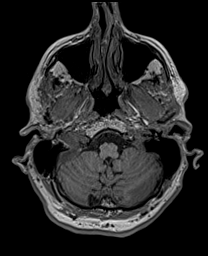
[im 54/160]
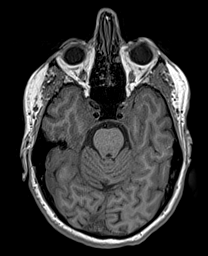
[im 80/160]
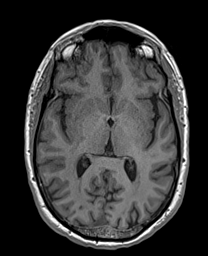
[im 107/160]
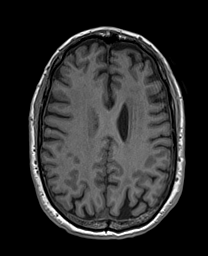
[im 133/160]
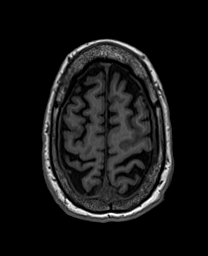
[im 160/160]
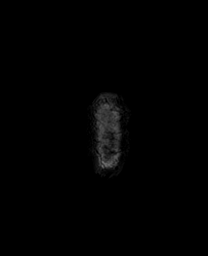

[Series 16: T2 · coronal · 3.0mm · 0.47mm/px · 1 of 27 slices shown (2 of 2)]
[im 1/27]
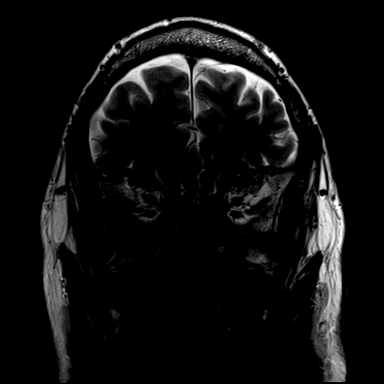

[Series 17: FLAIR · coronal · 3.0mm · 0.56mm/px · 1 of 30 slices shown (2 of 3)]
[im 1/30]
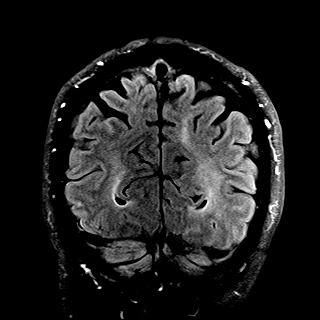

[Series 18: FLAIR · axial · 3.0mm · 0.72mm/px · 1 of 26 slices shown (3 of 3)]
[im 1/26]
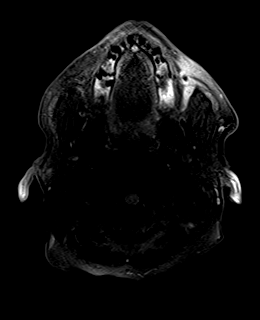

[Series 19: T2 post-contrast · coronal · 4.5mm · 0.36mm/px · 1 of 35 slices shown]
[im 1/35]
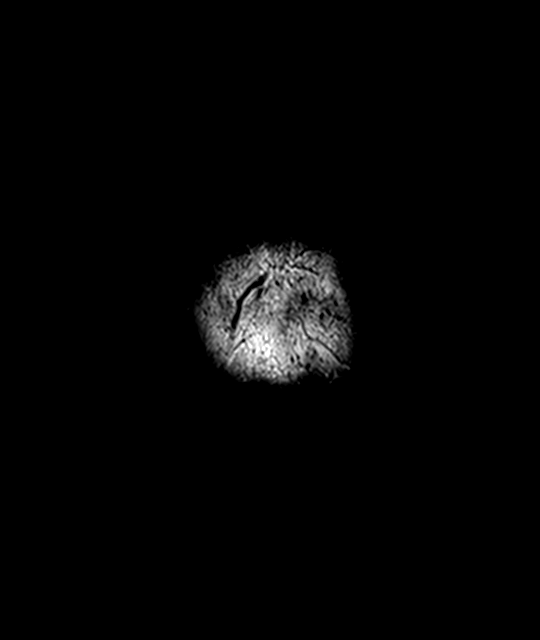

[Series 20: T1 · axial · 1.0mm · 0.90mm/px · z∈[-56,+100]mm · 7 of 160 slices shown (3 of 4)]
[im 1/160]
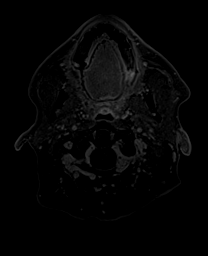
[im 27/160]
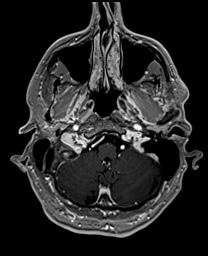
[im 54/160]
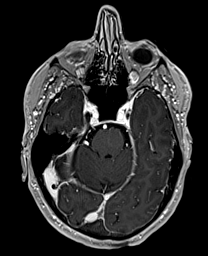
[im 80/160]
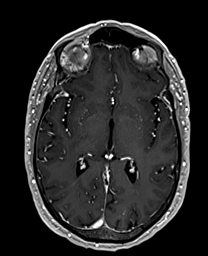
[im 107/160]
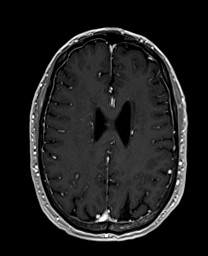
[im 133/160]
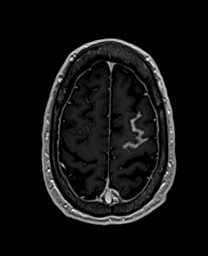
[im 160/160]
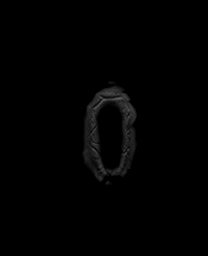

[Series 21: T1 post-contrast · coronal · 4.5mm · 0.72mm/px · 1 of 35 slices shown]
[im 1/35]
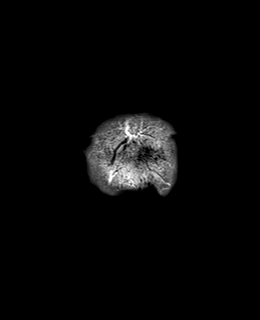

[Series 100: T1 · coronal · 1.0mm · 0.90mm/px · 4 of 103 slices shown (4 of 4)]
[im 1/103]
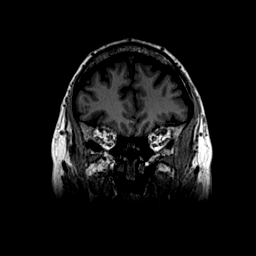
[im 35/103]
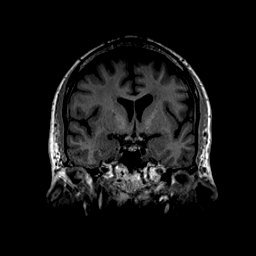
[im 69/103]
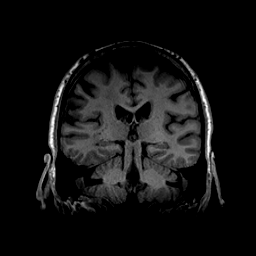
[im 103/103]
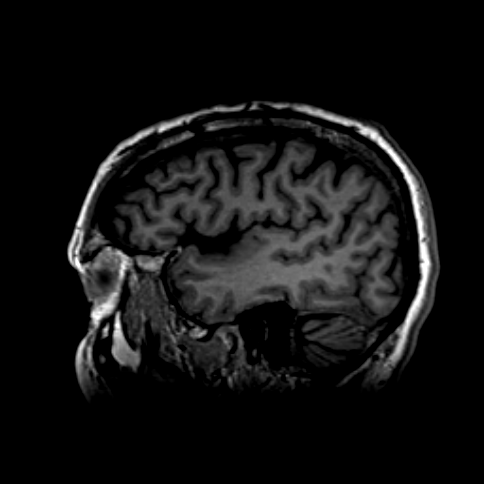

[48 of 48 positions shown; findings below may reference images not displayed]

FINDINGS: Brain: Leptomeningeal enhancement over the anterior left frontal
lobe is stable. Some increased T2/FLAIR signal is present in the
associated cortex. No significant remote blood products are present.

The diffusion-weighted images demonstrate no focal restricted
diffusion.

Moderate periventricular T2 hyperintensities are present, moderately
advanced for age. The ventricles are of normal size. No significant
extraaxial fluid collection is present.

The internal auditory canals are within normal limits. The brainstem
and cerebellum are within normal limits.

Vascular: Flow is present in the major intracranial arteries.

Skull and upper cervical spine: The craniocervical junction is
normal. Upper cervical spine is within normal limits. Marrow signal
is unremarkable.

Sinuses/Orbits: The paranasal sinuses and mastoid air cells are
clear. Left lens replacement is present. Globes and orbits are
otherwise within normal limits.
IMPRESSION: 1. Stable cortical and leptomeningeal enhancement over the anterior
left frontal lobe. This may be related to prior trauma or infection.
Granulomatous meningitis is considered less likely. No restricted
diffusion is present to suggest active disease. Post ischemic
changes are considered less likely.
2. No acute intracranial abnormality or significant interval change.
3. Moderate periventricular T2 hyperintensities bilaterally are
moderately advanced for age. The finding is nonspecific but can be
seen in the setting of chronic microvascular ischemia, a
demyelinating process such as multiple sclerosis, vasculitis,
complicated migraine headaches, or as the sequelae of a prior
infectious or inflammatory process.

## 2022-05-07 MED ORDER — GADOBENATE DIMEGLUMINE 529 MG/ML IV SOLN
18.0000 mL | Freq: Once | INTRAVENOUS | Status: AC | PRN
  Administered 2022-05-07: 18 mL via INTRAVENOUS

## 2022-05-08 ENCOUNTER — Telehealth: Payer: Self-pay | Admitting: Neurology

## 2022-05-08 DIAGNOSIS — R799 Abnormal finding of blood chemistry, unspecified: Secondary | ICD-10-CM

## 2022-05-08 DIAGNOSIS — G40209 Localization-related (focal) (partial) symptomatic epilepsy and epileptic syndromes with complex partial seizures, not intractable, without status epilepticus: Secondary | ICD-10-CM

## 2022-05-08 NOTE — Telephone Encounter (Signed)
Please call patient, repeat MRI of the brain with without contrast showed stable cortical and leptomeningeal enhancement over the left anterior frontal lobe, there was no significant change compared to previous study  I have ordered  lumbar puncture for further evaluation,   IMPRESSION: 1. Stable cortical and leptomeningeal enhancement over the anterior left frontal lobe. This may be related to prior trauma or infection. Granulomatous meningitis is considered less likely. No restricted diffusion is present to suggest active disease. Post ischemic changes are considered less likely. 2. No acute intracranial abnormality or significant interval change. 3. Moderate periventricular T2 hyperintensities bilaterally are moderately advanced for age. The finding is nonspecific but can be seen in the setting of chronic microvascular ischemia, a demyelinating process such as multiple sclerosis, vasculitis, complicated migraine headaches, or as the sequelae of a prior infectious or inflammatory process.

## 2022-05-14 NOTE — Telephone Encounter (Signed)
Spoke to pt, Pt verified by name and DOB, results given per provider, pt voiced understanding all question answered.

## 2022-05-14 NOTE — Telephone Encounter (Signed)
I attempted to reach the patient at the number provided. The phone rings, says mailbox is invalid, then discontinues the call.  I spoke with the patient's brother, Jenny Reichmann (as per [facility]). I informed him of the results of the MRI. He will relay the message regarding results and lumbar puncture.   He verbalized appreciation and understanding of the call. All questions answered.

## 2022-05-15 NOTE — Telephone Encounter (Signed)
Pt would like a call from the nurse to discuss why need to get another spinal tab. Because do not feel safe getting another one.

## 2022-05-22 NOTE — Telephone Encounter (Signed)
Left message at both number listed for him to call back for discussion

## 2022-05-22 NOTE — Telephone Encounter (Signed)
Pt has called back to speak with Dr Krista Blue, please call.

## 2022-05-23 NOTE — Telephone Encounter (Signed)
I have called patient, he has no recurrent seizure, but very worried about the potential, he has been compliant with his lamotrigine 100 mg twice a day  Again reviewed most recent MRI of the brain with without contrast on May 07, 2022, stable cortical and leptomeningeal enhancement over the anterior left frontal lobe,  I have suggested him for repeat lumbar puncture, he is very hesitant to go through the procedure, need more time to consider it  Advised him to keep follow-up appointment call office for recurrent seizure, or any changes.

## 2022-05-23 NOTE — Addendum Note (Signed)
Addended by: Marcial Pacas on: 05/23/2022 09:30 AM   Modules accepted: Orders

## 2022-06-05 ENCOUNTER — Other Ambulatory Visit: Payer: Self-pay | Admitting: Internal Medicine

## 2022-06-05 NOTE — Telephone Encounter (Signed)
Requested Prescriptions  Pending Prescriptions Disp Refills  . atorvastatin (LIPITOR) 10 MG tablet [Pharmacy Med Name: ATORVASTATIN 10 MG TABLET] 90 tablet 2    Sig: TAKE 1 TABLET BY MOUTH EVERY DAY     Cardiovascular:  Antilipid - Statins Failed - 06/05/2022  2:19 AM      Failed - Lipid Panel in normal range within the last 12 months    Cholesterol  Date Value Ref Range Status  02/21/2022 228 (H) <200 mg/dL Final   LDL Cholesterol (Calc)  Date Value Ref Range Status  02/21/2022 167 (H) mg/dL (calc) Final    Comment:    Reference range: <100 . Desirable range <100 mg/dL for primary prevention;   <70 mg/dL for patients with CHD or diabetic patients  with > or = 2 CHD risk factors. Marland Kitchen LDL-C is now calculated using the Martin-Hopkins  calculation, which is a validated novel method providing  better accuracy than the Friedewald equation in the  estimation of LDL-C.  Cresenciano Genre et al. Annamaria Helling. 2595;638(75): 2061-2068  (http://education.[facility].com/faq/FAQ164)    HDL  Date Value Ref Range Status  02/21/2022 40 > OR = 40 mg/dL Final   Triglycerides  Date Value Ref Range Status  02/21/2022 98 <150 mg/dL Final         Passed - Patient is not pregnant      Passed - Valid encounter within last 12 months    Recent Outpatient Visits          3 months ago Type 2 diabetes mellitus with hyperglycemia, without long-term current use of insulin [facility])   [facility], Coralie Keens, NP   6 months ago Neutropenia, unspecified type [facility])   [facility], Coralie Keens, NP   6 months ago Prediabetes   [facility], Coralie Keens, NP   10 months ago Post-procedural fever   [facility] [place], [place] W, NP   5 years ago URI with cough and congestion   [facility], PA-C

## 2022-06-10 ENCOUNTER — Ambulatory Visit: Payer: Self-pay | Admitting: *Deleted

## 2022-06-10 NOTE — Telephone Encounter (Signed)
  Chief Complaint: Some higher blood sugar readings Symptoms: None Frequency: Ongoing Pertinent Negatives: Patient denies any s/s of hyperglycemia Disposition: '[]'$ ED /'[]'$ Urgent Care (no appt availability in office) / '[x]'$ Appointment(In office/virtual)/ '[]'$  [facility]/ '[]'$ Home Care/ '[]'$ Refused Recommended Disposition /'[]'$ [facility]/ '[]'$  Follow-up with PCP Additional Notes: Pt has been following a keto diet to keep blood sugar under control. Pt has noticed that blood sugar is creeping up a bit. PT may want diet consult. Pt would rather not start medication. Pt wi;; keep a food and blood sugar log until appt.    Summary: BP concerns   Pt stated his BP has been rising. Pt stated yesterday morning 226 and pt mentioned 134. Pt also mentioned eating watermelon and is unsure if this is what caused it. Pt stated he is concerned about his BP fluctuating.   Pt denied any other symptoms.    Offered pt an appointment, but he declined the available early morning appointment and stated he is no longer driving due to seizures.   Pt seeking clinical advice.      Reason for Disposition  Blood glucose 70-240 mg/dL (3.9 -13.3 mmol/L)  Answer Assessment - Initial Assessment Questions 1. BLOOD GLUCOSE: "What is your blood glucose level?"      144 20 minutes ago ate 2  and 1/2 hours ago 2. ONSET: "When did you check the blood glucose?"     20 minutes ago 3. USUAL RANGE: "What is your glucose level usually?" (e.g., usual fasting morning value, usual evening value)     160 - 180  drops to 150 4. KETONES: "Do you check for ketones (urine or blood test strips)?" If Yes, ask: "What does the test show now?"      no 5. TYPE 1 or 2:  "Do you know what type of diabetes you have?"  (e.g., Type 1, Type 2, Gestational; doesn't know)      Type 2 6. INSULIN: "Do you take insulin?" "What type of insulin(s) do you use? What is the mode of delivery? (syringe, pen; injection or pump)?"      no 7.  DIABETES PILLS: "Do you take any pills for your diabetes?" If Yes, ask: "Have you missed taking any pills recently?"     none 8. OTHER SYMPTOMS: "Do you have any symptoms?" (e.g., fever, frequent urination, difficulty breathing, dizziness, weakness, vomiting)     no 9. PREGNANCY: "Is there any chance you are pregnant?" "When was your last menstrual period?"     na  Protocols used: Diabetes - High Blood Sugar-A-AH

## 2022-06-10 NOTE — Telephone Encounter (Signed)
2nd call attempted to contact patient regarding BP. No answer. LVMTCB [phone].

## 2022-06-10 NOTE — Telephone Encounter (Signed)
Summary: BP concerns   Pt stated his BP has been rising. Pt stated yesterday morning 226 and pt mentioned 134. Pt also mentioned eating watermelon and is unsure if this is what caused it. Pt stated he is concerned about his BP fluctuating.   Pt denied any other symptoms.    Offered pt an appointment, but he declined the available early morning appointment and stated he is no longer driving due to seizures.   Pt seeking clinical advice.       Called patient to review sx elevated BP/ questions. No answer , LVMTCB [phone].

## 2022-06-12 ENCOUNTER — Encounter: Payer: Self-pay | Admitting: Internal Medicine

## 2022-06-12 ENCOUNTER — Ambulatory Visit (INDEPENDENT_AMBULATORY_CARE_PROVIDER_SITE_OTHER): Payer: Medicare Other | Admitting: Internal Medicine

## 2022-06-12 VITALS — BP 114/76 | HR 72 | Temp 97.3°F | Wt 194.0 lb

## 2022-06-12 DIAGNOSIS — M8700 Idiopathic aseptic necrosis of unspecified bone: Secondary | ICD-10-CM

## 2022-06-12 DIAGNOSIS — E782 Mixed hyperlipidemia: Secondary | ICD-10-CM | POA: Diagnosis not present

## 2022-06-12 DIAGNOSIS — G40209 Localization-related (focal) (partial) symptomatic epilepsy and epileptic syndromes with complex partial seizures, not intractable, without status epilepticus: Secondary | ICD-10-CM

## 2022-06-12 DIAGNOSIS — G43709 Chronic migraine without aura, not intractable, without status migrainosus: Secondary | ICD-10-CM

## 2022-06-12 DIAGNOSIS — K219 Gastro-esophageal reflux disease without esophagitis: Secondary | ICD-10-CM

## 2022-06-12 DIAGNOSIS — I700 Atherosclerosis of aorta: Secondary | ICD-10-CM | POA: Diagnosis not present

## 2022-06-12 DIAGNOSIS — E1165 Type 2 diabetes mellitus with hyperglycemia: Secondary | ICD-10-CM

## 2022-06-12 MED ORDER — ONETOUCH VERIO VI STRP: ORAL_STRIP | 1 refills | Status: DC

## 2022-06-12 NOTE — Patient Instructions (Signed)
Heart-Healthy Eating Plan Heart-healthy meal planning includes: Eating less unhealthy fats. Eating more healthy fats. Making other changes in your diet. Talk with your doctor or a diet specialist (dietitian) to create an eating plan that is right for you. What is my plan? Your doctor may recommend an eating plan that includes: Total fat: ______% or less of total calories a day. Saturated fat: ______% or less of total calories a day. Cholesterol: less than _________mg a day. What are tips for following this plan? Cooking Avoid frying your food. Try to bake, boil, grill, or broil it instead. You can also reduce fat by: Removing the skin from poultry. Removing all visible fats from meats. Steaming vegetables in water or broth. Meal planning  At meals, divide your plate into four equal parts: Fill one-half of your plate with vegetables and green salads. Fill one-fourth of your plate with whole grains. Fill one-fourth of your plate with lean protein foods. Eat 4-5 servings of vegetables per day. A serving of vegetables is: 1 cup of raw or cooked vegetables. 2 cups of raw leafy greens. Eat 4-5 servings of fruit per day. A serving of fruit is: 1 medium whole fruit.  cup of dried fruit.  cup of fresh, frozen, or canned fruit.  cup of 100% fruit juice. Eat more foods that have soluble fiber. These are apples, broccoli, carrots, beans, peas, and barley. Try to get 20-30 g of fiber per day. Eat 4-5 servings of nuts, legumes, and seeds per week: 1 serving of dried beans or legumes equals  cup after being cooked. 1 serving of nuts is  cup. 1 serving of seeds equals 1 tablespoon. General information Eat more home-cooked food. Eat less restaurant, buffet, and fast food. Limit or avoid alcohol. Limit foods that are high in starch and sugar. Avoid fried foods. Lose weight if you are overweight. Keep track of how much salt (sodium) you eat. This is important if you have high blood  pressure. Ask your doctor to tell you more about this. Try to add vegetarian meals each week. Fats Choose healthy fats. These include olive oil and canola oil, flaxseeds, walnuts, almonds, and seeds. Eat more omega-3 fats. These include salmon, mackerel, sardines, tuna, flaxseed oil, and ground flaxseeds. Try to eat fish at least 2 times each week. Check food labels. Avoid foods with trans fats or high amounts of saturated fat. Limit saturated fats. These are often found in animal products, such as meats, butter, and cream. These are also found in plant foods, such as palm oil, palm kernel oil, and coconut oil. Avoid foods with partially hydrogenated oils in them. These have trans fats. Examples are stick margarine, some tub margarines, cookies, crackers, and other baked goods. What foods can I eat? Fruits All fresh, canned (in natural juice), or frozen fruits. Vegetables Fresh or frozen vegetables (raw, steamed, roasted, or grilled). Green salads. Grains Most grains. Choose whole wheat and whole grains most of the time. Rice and pasta, including brown rice and pastas made with whole wheat. Meats and other proteins Lean, well-trimmed beef, veal, pork, and lamb. Chicken and turkey without skin. All fish and shellfish. Wild duck, rabbit, pheasant, and venison. Egg whites or low-cholesterol egg substitutes. Dried beans, peas, lentils, and tofu. Seeds and most nuts. Dairy Low-fat or nonfat cheeses, including ricotta and mozzarella. Skim or 1% milk that is liquid, powdered, or evaporated. Buttermilk that is made with low-fat milk. Nonfat or low-fat yogurt. Fats and oils Non-hydrogenated (trans-free) margarines. Vegetable oils, including   soybean, sesame, sunflower, olive, peanut, safflower, corn, canola, and cottonseed. Salad dressings or mayonnaise made with a vegetable oil. Beverages Mineral water. Coffee and tea. Diet carbonated beverages. Sweets and desserts Sherbet, gelatin, and fruit ice.  Small amounts of dark chocolate. Limit all sweets and desserts. Seasonings and condiments All seasonings and condiments. The items listed above may not be a complete list of foods and drinks you can eat. Contact a dietitian for more options. What foods should I avoid? Fruits Canned fruit in heavy syrup. Fruit in cream or butter sauce. Fried fruit. Limit coconut. Vegetables Vegetables cooked in cheese, cream, or butter sauce. Fried vegetables. Grains Breads that are made with saturated or trans fats, oils, or whole milk. Croissants. Sweet rolls. Donuts. High-fat crackers, such as cheese crackers. Meats and other proteins Fatty meats, such as hot dogs, ribs, sausage, bacon, rib-eye roast or steak. High-fat deli meats, such as salami and bologna. Caviar. Domestic duck and goose. Organ meats, such as liver. Dairy Cream, sour cream, cream cheese, and creamed cottage cheese. Whole-milk cheeses. Whole or 2% milk that is liquid, evaporated, or condensed. Whole buttermilk. Cream sauce or high-fat cheese sauce. Yogurt that is made from whole milk. Fats and oils Meat fat, or shortening. Cocoa butter, hydrogenated oils, palm oil, coconut oil, palm kernel oil. Solid fats and shortenings, including bacon fat, salt pork, lard, and butter. Nondairy cream substitutes. Salad dressings with cheese or sour cream. Beverages Regular sodas and juice drinks with added sugar. Sweets and desserts Frosting. Pudding. Cookies. Cakes. Pies. Milk chocolate or white chocolate. Buttered syrups. Full-fat ice cream or ice cream drinks. The items listed above may not be a complete list of foods and drinks to avoid. Contact a dietitian for more information. Summary Heart-healthy meal planning includes eating less unhealthy fats, eating more healthy fats, and making other changes in your diet. Eat a balanced diet. This includes fruits and vegetables, low-fat or nonfat dairy, lean protein, nuts and legumes, whole grains, and  heart-healthy oils and fats. This information is not intended to replace advice given to you by your health care provider. Make sure you discuss any questions you have with your health care provider. Document Revised: 03/14/2021 Document Reviewed: 03/14/2021 Elsevier Patient Education  2022 Elsevier Inc.  

## 2022-06-12 NOTE — Assessment & Plan Note (Signed)
Walking with cane Follows with orthopedics

## 2022-06-12 NOTE — Assessment & Plan Note (Signed)
Continue Lamotrigine He will continue to follow with neurology

## 2022-06-12 NOTE — Assessment & Plan Note (Signed)
Avoid foods that trigger your reflux Ok to take TUMS as needed

## 2022-06-12 NOTE — Assessment & Plan Note (Signed)
Too soon for A1C Encouraged low carb diet and exercise for weight loss Encouraged routine eye exam Encouraged routine foot exam He declines immunizations

## 2022-06-12 NOTE — Assessment & Plan Note (Signed)
Continue Nurtec He will continue to follow with neurology

## 2022-06-12 NOTE — Assessment & Plan Note (Signed)
CME and lipid profile today Encouraged him to consume a low fat diet Continue Atorvastatin

## 2022-06-12 NOTE — Assessment & Plan Note (Signed)
CME and lipid profile today Encouraged him to consume a low fat diet Continue Atorvastatin He is unable to take Aspirin

## 2022-06-12 NOTE — Progress Notes (Signed)
Subjective:    Patient ID: Ralph Simpson, male    DOB: 06-11-1961, 61 y.o.   MRN: [id number]  HPI  Patient presents to clinic today for 56-monthfollow-up of chronic conditions.  AVN, Bilateral Hips: Status post bone grafting.  He follows with [facility].  GERD: He is not sure what triggers this, maybe meat. He is not taking Pantoprazole as prescribed.  Upper GI from 07/2021 reviewed.  Migraines: Triggered by weather changes. He reports he has not had a migraine in a while. He takes Nurtec and lays in a dark room with some relief of symptoms.  He follows with neurology.  DM 2: His last A1c was 6%, 04/2022.  He is not taking any oral diabetic medication at this time.  His sugars range 134-220. He has not seen an eye doctor in the last year. He does not check his feet routinely.  HLD with Aortic Atherosclerosis: His last LDL was 167, triglycerides 98, 02/2022.  He has been having myalgias and joint pain on Atorvastatin.  He tries to consume a low-fat diet. He does not take flu, covid vaccines.   Seizures: He denies recent seizures on Lamotrigine.  He follows with neurology.   Review of Systems     Past Medical History:  Diagnosis Date   Allergy    Avascular necrosis of bones of both hips (HCC)    GERD (gastroesophageal reflux disease)    Migraine    Neutropenia (HCC)    Osteoarthritis of both hips    Plantar fasciitis    Seizures (HCC)    x 2 03/2021   Trochanteric bursitis of right hip     Current Outpatient Medications  Medication Sig Dispense Refill   atorvastatin (LIPITOR) 10 MG tablet TAKE 1 TABLET BY MOUTH EVERY DAY 90 tablet 2   Blood Glucose Monitoring Suppl (ONETOUCH VERIO) w/Device KIT Used to check blood sugar once a day. DX E11.9 1 kit 0   glucose blood (ONETOUCH VERIO) test strip Check blood sugar using 1 strip daily for blood sugar monitoring related to DM 2 (E11.9) 100 strip 1   lamoTRIgine (LAMICTAL) 100 MG tablet Take 1 tablet (100 mg total) by mouth  every morning AND 2 tablets (200 mg total) at bedtime. 270 tablet 3   OneTouch Delica Lancets 372ZMISC Use to check blood sugar once a day.  DX: E11.9 100 each 1   pantoprazole (PROTONIX) 40 MG tablet Take 1 tablet (40 mg total) by mouth daily. 30 tablet 0   Rimegepant Sulfate (NURTEC) 75 MG TBDP Take 1 tab at onset of migraine.  May repeat in 2 hrs, if needed.  Max dose: 2 tabs/day. This is a 30 day prescription. 12 tablet 11   tizanidine (ZANAFLEX) 2 MG capsule Take 1 capsule (2 mg total) by mouth at bedtime as needed for muscle spasms. 15 capsule 0   No current facility-administered medications for this visit.    Allergies  Allergen Reactions   Aspirin     Causes brain bleed     Family History  Problem Relation Age of Onset   Arthritis Mother    Stroke Mother    Diabetes Father    Heart disease Father    Hyperlipidemia Father    Hypertension Father    Kidney disease Maternal Grandfather    Colon cancer Neg Hx    Stomach cancer Neg Hx    Rectal cancer Neg Hx     Social History   Socioeconomic History  Marital status: Single    Spouse name: Not on file   Number of children: 0   Years of education: Not on file   Highest education level: 10th grade  Occupational History    Comment: NA  Tobacco Use   Smoking status: Former    Packs/day: 1.00    Types: Cigarettes    Passive exposure: Never   Smokeless tobacco: Former    Types: Chew   Tobacco comments:    quit 10-15 years ago  Vaping Use   Vaping Use: Never used  Substance and Sexual Activity   Alcohol use: Not Currently   Drug use: No   Sexual activity: Not Currently  Other Topics Concern   Not on file  Social History Narrative   Not on file   Social Determinants of Health   Financial Resource Strain: Not on file  Food Insecurity: Not on file  Transportation Needs: Not on file  Physical Activity: Not on file  Stress: Not on file  Social Connections: Not on file  Intimate Partner Violence: Not on file      Constitutional: Patient reports intermittent headaches.  Denies fever, malaise, fatigue, or abrupt weight changes.  HEENT: Denies eye pain, eye redness, ear pain, ringing in the ears, wax buildup, runny nose, nasal congestion, bloody nose, or sore throat. Respiratory: Denies difficulty breathing, shortness of breath, cough or sputum production.   Cardiovascular: Denies chest pain, chest tightness, palpitations or swelling in the hands or feet.  Gastrointestinal: Pt reports intermittent reflux. Denies abdominal pain, bloating, constipation, diarrhea or blood in the stool.  GU: Denies urgency, frequency, pain with urination, burning sensation, blood in urine, odor or discharge. Musculoskeletal: Pt reports muscle and joint pain. Denies decrease in range of motion, difficulty with gait,  or joint swelling.  Skin: Denies redness, rashes, lesions or ulcercations.  Neurological: Denies dizziness, difficulty with memory, difficulty with speech or problems with balance and coordination.  Psych: Denies anxiety, depression, SI/HI.  No other specific complaints in a complete review of systems (except as listed in HPI above).  Objective:   Physical Exam  BP 114/76 (BP Location: Left Arm, Patient Position: Sitting, Cuff Size: Normal)   Pulse 72   Temp (!) 97.3 F (36.3 C) (Temporal)   Wt 194 lb (88 kg)   SpO2 99%   BMI 24.25 kg/m   Wt Readings from Last 3 Encounters:  04/28/22 192 lb (87.1 kg)  02/21/22 191 lb (86.6 kg)  12/30/21 197 lb 3.2 oz (89.4 kg)    General: Appears his stated age, well developed, well nourished in NAD. Skin: Warm, dry and intact. No ulcerations noted. HEENT: Head: normal shape and size; Eyes: sclera white, no icterus, conjunctiva pink, PERRLA and EOMs intact;  Cardiovascular: Normal rate and rhythm. S1,S2 noted.  No murmur, rubs or gallops noted. No JVD or BLE edema. No carotid bruits noted. Pulmonary/Chest: Normal effort and positive vesicular breath sounds. No  respiratory distress. No wheezes, rales or ronchi noted.  Abdomen: Soft and nontender. Normal bowel sounds.  Musculoskeletal: Gait slow and steady with use of cane. Neurological: Alert and oriented. Coordination normal.    BMET    Component Value Date/Time   NA 138 12/15/2021 1304   NA 141 07/16/2021 0813   K 5.0 12/15/2021 1304   CL 104 12/15/2021 1304   CO2 27 12/15/2021 1304   GLUCOSE 116 (H) 12/15/2021 1304   BUN 27 (H) 12/15/2021 1304   BUN 20 07/16/2021 0813   CREATININE 1.18 12/15/2021  1304   CREATININE 0.93 11/13/2021 1016   CALCIUM 9.5 12/15/2021 1304   GFRNONAA >60 12/15/2021 1304   GFRAA >60 11/11/2015 1340    Lipid Panel     Component Value Date/Time   CHOL 228 (H) 02/21/2022 1512   TRIG 98 02/21/2022 1512   HDL 40 02/21/2022 1512   CHOLHDL 5.7 (H) 02/21/2022 1512   VLDL 16.6 03/05/2020 1003   LDLCALC 167 (H) 02/21/2022 1512    CBC    Component Value Date/Time   WBC 3.8 04/28/2022 1559   WBC 3.0 (L) 12/15/2021 1304   RBC 4.99 04/28/2022 1559   RBC 5.05 12/15/2021 1304   HGB 14.8 04/28/2022 1559   HCT 43.0 04/28/2022 1559   PLT 211 04/28/2022 1559   MCV 86 04/28/2022 1559   MCH 29.7 04/28/2022 1559   MCH 29.3 12/15/2021 1304   MCHC 34.4 04/28/2022 1559   MCHC 33.9 12/15/2021 1304   RDW 12.6 04/28/2022 1559   LYMPHSABS 1.1 04/28/2022 1559   MONOABS 0.3 12/15/2021 1304   EOSABS 0.1 04/28/2022 1559   BASOSABS 0.0 04/28/2022 1559    Hgb A1C Lab Results  Component Value Date   HGBA1C 6.0 (H) 04/28/2022            Assessment & Plan:     RTC in 3 months, follow-up chronic conditions Webb Silversmith, NP

## 2022-06-13 LAB — COMPLETE METABOLIC PANEL WITH GFR
AG Ratio: 2.4 (calc) (ref 1.0–2.5)
ALT: 19 U/L (ref 9–46)
AST: 14 U/L (ref 10–35)
Albumin: 5 g/dL (ref 3.6–5.1)
Alkaline phosphatase (APISO): 68 U/L (ref 35–144)
BUN: 20 mg/dL (ref 7–25)
CO2: 27 mmol/L (ref 20–32)
Calcium: 9.9 mg/dL (ref 8.6–10.3)
Chloride: 105 mmol/L (ref 98–110)
Creat: 0.79 mg/dL (ref 0.70–1.35)
Globulin: 2.1 g/dL (calc) (ref 1.9–3.7)
Glucose, Bld: 183 mg/dL — ABNORMAL HIGH (ref 65–99)
Potassium: 4.8 mmol/L (ref 3.5–5.3)
Sodium: 140 mmol/L (ref 135–146)
Total Bilirubin: 0.5 mg/dL (ref 0.2–1.2)
Total Protein: 7.1 g/dL (ref 6.1–8.1)
eGFR: 100 mL/min/{1.73_m2} (ref >=60)

## 2022-06-13 LAB — LIPID PANEL
Cholesterol: 175 mg/dL (ref <200)
HDL: 46 mg/dL (ref >=40)
LDL Cholesterol (Calc): 112 mg/dL (calc) — ABNORMAL HIGH
Non-HDL Cholesterol (Calc): 129 mg/dL (calc) (ref <130)
Total CHOL/HDL Ratio: 3.8 (calc) (ref <5.0)
Triglycerides: 76 mg/dL (ref <150)

## 2022-06-23 ENCOUNTER — Ambulatory Visit: Payer: Medicare Other | Admitting: Internal Medicine

## 2022-06-25 ENCOUNTER — Ambulatory Visit: Payer: Medicare Other | Admitting: Internal Medicine

## 2022-06-26 ENCOUNTER — Ambulatory Visit: Payer: Self-pay

## 2022-06-26 ENCOUNTER — Telehealth: Payer: Self-pay | Admitting: Neurology

## 2022-06-26 NOTE — Telephone Encounter (Signed)
Summary: skin irritation   The patient has been experiencing itching on their entire body   The skin irritation began last night 06/25/22   The patient shares that they have not been in contact with any plant but they were outside yesterday   The patient would like to be contacted to discuss further      Chief Complaint: itching Symptoms: itching all over body Frequency: started last night - pt could not sleep Pertinent Negatives: Patient denies rash of any kind, difficulty breathing, blisters Disposition: '[]'$ ED /'[]'$ Urgent Care (no appt availability in office) / '[x]'$ Appointment(In office/virtual)/ '[]'$  [facility]/ '[]'$ Home Care/ '[]'$ Refused Recommended Disposition /'[]'$ [facility]/ '[]'$  Follow-up with PCP Additional Notes: Agent made appt for pt for tomorrow  Reason for Disposition  [1] MODERATE-SEVERE widespread itching (i.e., interferes with sleep, normal activities or school) AND [2] not improved after 24 hours of itching Care Advice  Answer Assessment - Initial Assessment Questions 1. DESCRIPTION: "Describe the itching you are having."     Feels like rolling in grass- face, 1 finger, side of under axilla 2. SEVERITY: "How bad is it?"    - MILD: Doesn't interfere with normal activities.   - MODERATE-SEVERE: Interferes with work, school, sleep, or other activities.      Moderate to severe 3. SCRATCHING: "Are there any scratch marks? Bleeding?"     no 4. ONSET: "When did this begin?"      Last night 5. CAUSE: "What do you think is causing the itching?" (ask about swimming pools, pollen, animals, soaps, etc.)     No- 6. OTHER SYMPTOMS: "Do you have any other symptoms?"      no 7. PREGNANCY: "Is there any chance you are pregnant?" "When was your last menstrual period?"     N/a  Protocols used: Itching - [facility]

## 2022-06-26 NOTE — Telephone Encounter (Signed)
Pt is calling and said he is itching all over. Pt said he don't know if it the medication. Pt is requesting a return call from the nurse

## 2022-06-26 NOTE — Telephone Encounter (Signed)
I spoke to the patient. He is under our care for seizures. He has been on lamotrigine since 07/06/21. His last increase of this medication was 04/28/22 (from '100mg'$  BID to '100mg'$  in am and '200mg'$  in pm). Says he tolerated the higher dosage without any adverse side effects. He started experiencing itching in various areas of his body last evening which interfered with his sleep. He got up from bed, took a shower and was able to get some rest afterwards. He is still itching today, no worse or better. Denies any presence of rash. Denies trying any new food. No new laundry detergent or soaps. 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 He has been on lamotrigine for over one year. Not likely related to the medication. He is scheduled to see his PCP 06/27/22 for this problem. Advised him to keep this appt. Suggested he may try a cool shower, stay in a climate controlled environment and even try Benadryl (he has taken before without issues). These things may help to alleviate the itching. He verbalized understanding and was in agreement with this plan.

## 2022-06-27 ENCOUNTER — Ambulatory Visit (INDEPENDENT_AMBULATORY_CARE_PROVIDER_SITE_OTHER): Payer: Medicare Other | Admitting: Internal Medicine

## 2022-06-27 ENCOUNTER — Encounter: Payer: Self-pay | Admitting: Internal Medicine

## 2022-06-27 ENCOUNTER — Other Ambulatory Visit: Payer: Self-pay

## 2022-06-27 VITALS — BP 128/74 | HR 83 | Temp 97.1°F | Wt 195.0 lb

## 2022-06-27 DIAGNOSIS — L299 Pruritus, unspecified: Secondary | ICD-10-CM

## 2022-06-27 DIAGNOSIS — M8700 Idiopathic aseptic necrosis of unspecified bone: Secondary | ICD-10-CM

## 2022-06-27 MED ORDER — METHYLPREDNISOLONE ACETATE 80 MG/ML IJ SUSP
80.0000 mg | Freq: Once | INTRAMUSCULAR | Status: AC
  Administered 2022-06-27: 80 mg via INTRAMUSCULAR

## 2022-06-27 NOTE — Patient Instructions (Signed)
SARNA LOTION  Pruritus Pruritus is an itchy feeling on the skin. One of the most common causes is dry skin, but many different things can cause itching. Most cases of itching do not require medical attention. Sometimes itchy skin can turn into a rash or a secondary infection. Follow these instructions at home: Skin care  Do not use scented soaps, detergents, perfumes, and cosmetic products. Instead, use gentle, unscented versions of these items. Apply moisturizing creams to your skin frequently, at least twice daily. Apply immediately after bathing while skin is still wet. Take medicines or apply medicated creams only as told by your health care provider. This may include: Corticosteroid cream or topical calcineurin inhibitor. Anti-itch lotions containing urea, camphor, or menthol. Oral antihistamines. Do not take hot showers or baths, which can make itching worse. A short, cool shower may help with itching as long as you apply moisturizing lotion after the shower. Apply a cool, wet cloth (cool compress) to the affected areas. You may take lukewarm baths with one of the following: Epsom salts. You can get these at your local pharmacy or grocery store. Follow the instructions on the packaging. Baking soda. Pour a small amount into the bath as told by your health care provider. Colloidal oatmeal. You can get this at your local pharmacy or grocery store. Follow the instructions on the packaging. Do not scratch your skin. General instructions Avoid wearing tight clothes. Keep a journal to help find out what is causing your itching. Write down: What you eat and drink. What cosmetic products you use. What soaps or detergents you use. What you wear, including jewelry. Use a humidifier. This keeps the air moist, which helps to prevent dry skin. Be aware of any changes in your itchiness. Tell your health care provider about any changes. Contact a health care provider if: The itching does not go  away after several days. You notice redness, warmth, or drainage on the skin where you have scratched. You are unusually thirsty or urinating more than normal. Your skin tingles or feels numb. Your skin or the white parts of your eyes turn yellow (jaundice). You feel weak. You have any of the following: Night sweats. Tiredness (fatigue). Weight loss. Abdominal pain. Summary Pruritus is an itchy feeling on the skin. One of the most common causes is dry skin, but many different conditions and factors can cause itching. Apply moisturizing creams to your skin frequently, at least twice daily. Apply immediately after bathing while skin is still wet. Take medicines or apply medicated creams only as told by your health care provider. Do not take hot showers or baths. Do not use scented soaps, detergents, perfumes, or cosmetic products. Keep a journal to help find out what is causing your itching. This information is not intended to replace advice given to you by your health care provider. Make sure you discuss any questions you have with your health care provider. Document Revised: 12/11/2021 Document Reviewed: 12/11/2021 Elsevier Patient Education  2023 Elsevier Inc.  

## 2022-06-27 NOTE — Progress Notes (Signed)
Subjective:    Patient ID: Ralph Simpson, male    DOB: 17-Nov-1961, 61 y.o.   MRN: [id number]  HPI  Patient presents to clinic today with complaint of itching.  He noticed this yesterday.  He reports the itching is in different spots at different times.  He has not noticed a rash.  He reports he feels like this is on the inside.  He denies changes in soaps, lotions, detergents, diet or medication.  He has tried Benadryl OTC with minimal relief of symptoms.  Review of Systems     Past Medical History:  Diagnosis Date   Allergy    Avascular necrosis of bones of both hips (HCC)    GERD (gastroesophageal reflux disease)    Migraine    Neutropenia (HCC)    Osteoarthritis of both hips    Plantar fasciitis    Seizures (HCC)    x 2 03/2021   Trochanteric bursitis of right hip     Current Outpatient Medications  Medication Sig Dispense Refill   atorvastatin (LIPITOR) 10 MG tablet TAKE 1 TABLET BY MOUTH EVERY DAY 90 tablet 2   Blood Glucose Monitoring Suppl (ONETOUCH VERIO) w/Device KIT Used to check blood sugar once a day. DX E11.9 1 kit 0   glucose blood (ONETOUCH VERIO) test strip Check blood sugar using 1 strip daily for blood sugar monitoring related to DM 2 (E11.9) 100 strip 1   lamoTRIgine (LAMICTAL) 100 MG tablet Take 1 tablet (100 mg total) by mouth every morning AND 2 tablets (200 mg total) at bedtime. 270 tablet 3   OneTouch Delica Lancets 67E MISC Use to check blood sugar once a day.  DX: E11.9 100 each 1   Rimegepant Sulfate (NURTEC) 75 MG TBDP Take 1 tab at onset of migraine.  May repeat in 2 hrs, if needed.  Max dose: 2 tabs/day. This is a 30 day prescription. 12 tablet 11   No current facility-administered medications for this visit.    Allergies  Allergen Reactions   Aspirin     Causes brain bleed     Family History  Problem Relation Age of Onset   Arthritis Mother    Stroke Mother    Diabetes Father    Heart disease Father    Hyperlipidemia Father     Hypertension Father    Kidney disease Maternal Grandfather    Colon cancer Neg Hx    Stomach cancer Neg Hx    Rectal cancer Neg Hx     Social History   Socioeconomic History   Marital status: Single    Spouse name: Not on file   Number of children: 0   Years of education: Not on file   Highest education level: 10th grade  Occupational History    Comment: NA  Tobacco Use   Smoking status: Former    Packs/day: 1.00    Types: Cigarettes    Passive exposure: Never   Smokeless tobacco: Former    Types: Chew   Tobacco comments:    quit 10-15 years ago  Vaping Use   Vaping Use: Never used  Substance and Sexual Activity   Alcohol use: Not Currently   Drug use: No   Sexual activity: Not Currently  Other Topics Concern   Not on file  Social History Narrative   Not on file   Social Determinants of Health   Financial Resource Strain: Not on file  Food Insecurity: Not on file  Transportation Needs: Not on file  Physical Activity: Not on file  Stress: Not on file  Social Connections: Not on file  Intimate Partner Violence: Not on file     Constitutional: Denies fever, malaise, fatigue, headache or abrupt weight changes.  HEENT: Denies eye pain, eye redness, ear pain, ringing in the ears, wax buildup, runny nose, nasal congestion, bloody nose, or sore throat. Respiratory: Denies difficulty breathing, shortness of breath, cough or sputum production.   Cardiovascular: Denies chest pain, chest tightness, palpitations or swelling in the hands or feet.  Skin: Patient reports itching.  Denies redness, rash or ulcercations.   No other specific complaints in a complete review of systems (except as listed in HPI above).  Objective:   Physical Exam  BP 128/74 (BP Location: Right Arm, Patient Position: Sitting, Cuff Size: Normal)   Pulse 83   Temp (!) 97.1 F (36.2 C) (Temporal)   Wt 195 lb (88.5 kg)   SpO2 97%   BMI 24.37 kg/m   Wt Readings from Last 3 Encounters:   06/12/22 194 lb (88 kg)  04/28/22 192 lb (87.1 kg)  02/21/22 191 lb (86.6 kg)    General: Appears his stated age, well developed, well nourished in NAD. Skin: He does have a scattered maculopapular rash noted of his abdomen and chest.  No rash observed on the upper or lower extremities. HEENT: Head: normal shape and size; Throat/Mouth: Teeth present, mucosa pink and moist, no exudate, lesions or ulcerations noted.  Cardiovascular: Normal rate and rhythm.  Pulmonary/Chest: Normal effort and positive vesicular breath sounds. No respiratory distress. No wheezes, rales or ronchi noted.  Neurological: Alert and oriented.   BMET    Component Value Date/Time   NA 140 06/12/2022 1001   NA 141 07/16/2021 0813   K 4.8 06/12/2022 1001   CL 105 06/12/2022 1001   CO2 27 06/12/2022 1001   GLUCOSE 183 (H) 06/12/2022 1001   BUN 20 06/12/2022 1001   BUN 20 07/16/2021 0813   CREATININE 0.79 06/12/2022 1001   CALCIUM 9.9 06/12/2022 1001   GFRNONAA >60 12/15/2021 1304   GFRAA >60 11/11/2015 1340    Lipid Panel     Component Value Date/Time   CHOL 175 06/12/2022 1001   TRIG 76 06/12/2022 1001   HDL 46 06/12/2022 1001   CHOLHDL 3.8 06/12/2022 1001   VLDL 16.6 03/05/2020 1003   LDLCALC 112 (H) 06/12/2022 1001    CBC    Component Value Date/Time   WBC 3.8 04/28/2022 1559   WBC 3.0 (L) 12/15/2021 1304   RBC 4.99 04/28/2022 1559   RBC 5.05 12/15/2021 1304   HGB 14.8 04/28/2022 1559   HCT 43.0 04/28/2022 1559   PLT 211 04/28/2022 1559   MCV 86 04/28/2022 1559   MCH 29.7 04/28/2022 1559   MCH 29.3 12/15/2021 1304   MCHC 34.4 04/28/2022 1559   MCHC 33.9 12/15/2021 1304   RDW 12.6 04/28/2022 1559   LYMPHSABS 1.1 04/28/2022 1559   MONOABS 0.3 12/15/2021 1304   EOSABS 0.1 04/28/2022 1559   BASOSABS 0.0 04/28/2022 1559    Hgb A1C Lab Results  Component Value Date   HGBA1C 6.0 (H) 04/28/2022            Assessment & Plan:   Pruritus:  80 mg Depo-Medrol IM x1 Continue  Benadryl at bedtime for the next week  RTC in 2 months for follow-up of chronic conditions Webb Silversmith, NP

## 2022-06-30 ENCOUNTER — Inpatient Hospital Stay (HOSPITAL_BASED_OUTPATIENT_CLINIC_OR_DEPARTMENT_OTHER): Payer: Medicare Other | Admitting: Oncology

## 2022-06-30 ENCOUNTER — Inpatient Hospital Stay: Payer: Medicare Other | Attending: Oncology

## 2022-06-30 ENCOUNTER — Encounter: Payer: Self-pay | Admitting: Oncology

## 2022-06-30 VITALS — BP 115/79 | HR 79 | Temp 97.6°F | Wt 192.0 lb

## 2022-06-30 DIAGNOSIS — D709 Neutropenia, unspecified: Secondary | ICD-10-CM | POA: Insufficient documentation

## 2022-06-30 DIAGNOSIS — Z87891 Personal history of nicotine dependence: Secondary | ICD-10-CM | POA: Insufficient documentation

## 2022-06-30 DIAGNOSIS — M879 Osteonecrosis, unspecified: Secondary | ICD-10-CM | POA: Insufficient documentation

## 2022-06-30 DIAGNOSIS — M8700 Idiopathic aseptic necrosis of unspecified bone: Secondary | ICD-10-CM | POA: Diagnosis not present

## 2022-06-30 LAB — CBC WITH DIFFERENTIAL/PLATELET
Abs Immature Granulocytes: 0.02 10*3/uL (ref 0.00–0.07)
Basophils Absolute: 0 10*3/uL (ref 0.0–0.1)
Basophils Relative: 1 %
Eosinophils Absolute: 0.1 10*3/uL (ref 0.0–0.5)
Eosinophils Relative: 2 %
HCT: 41.8 % (ref 39.0–52.0)
Hemoglobin: 14.3 g/dL (ref 13.0–17.0)
Immature Granulocytes: 1 %
Lymphocytes Relative: 30 %
Lymphs Abs: 1.2 10*3/uL (ref 0.7–4.0)
MCH: 29.3 pg (ref 26.0–34.0)
MCHC: 34.2 g/dL (ref 30.0–36.0)
MCV: 85.7 fL (ref 80.0–100.0)
Monocytes Absolute: 0.4 10*3/uL (ref 0.1–1.0)
Monocytes Relative: 10 %
Neutro Abs: 2.3 10*3/uL (ref 1.7–7.7)
Neutrophils Relative %: 56 %
Platelets: 193 10*3/uL (ref 150–400)
RBC: 4.88 MIL/uL (ref 4.22–5.81)
RDW: 13 % (ref 11.5–15.5)
WBC: 4 10*3/uL (ref 4.0–10.5)
nRBC: 0 % (ref 0.0–0.2)

## 2022-06-30 LAB — VITAMIN B12: Vitamin B-12: 850 pg/mL (ref 180–914)

## 2022-06-30 LAB — LACTATE DEHYDROGENASE: LDH: 125 U/L (ref 98–192)

## 2022-06-30 LAB — FOLATE: Folate: 17.4 ng/mL (ref >5.9)

## 2022-06-30 NOTE — Progress Notes (Signed)
Hematology/Oncology Consult note  [phone] [phone]   Patient Care Team: Jearld Fenton, NP as PCP - General (Internal Medicine) Earlie Server, MD as Consulting Physician (Hematology and Oncology)  REFERRING PROVIDER: Jearld Fenton, NP  CHIEF COMPLAINTS/REASON FOR VISIT:  Leukopenia  HISTORY OF PRESENTING ILLNESS:  DONYEL Simpson is a 61 y.o. male who was seen in consultation at the request of Jearld Fenton, NP for evaluation of leukopenia.  11/13/2021, CBC showed white count of 2.6, ANC 1.2.  Normal hemoglobin and platelet counts.  Reviewed patient's previous history, patient has normal white count on 04/07/2021, after that he started to have decrease of total white count as well as neutropenia. Patient was hospitalized in May 2022 due to seizure activities.   Patient follows up with neurology Dr. Krista Simpson.  Currently he is on Depakote as well as lamotrigine. Patient presented to emergency room on 08/04/2021.  Patient had a fever, body aches and headache.  ANC was decreased at 0.9.  Total white count 1.7.  Smear review showed leukopenia with absolute neutropenia.  Moderate left shift. Today patient reports feeling well.  Denies nausea vomiting diarrhea.    INTERVAL HISTORY Ralph Simpson is a 61 y.o. male who has above history reviewed by me today presents for follow up visit for leukopenia.   Patient follows up with neurology for seizure management.  He has been off Depakote and currently on lamotrigine. He recently received a steroid injection to his hip.  No new complaints. Denies weight loss, fever, chills, fatigue, night sweats.    Review of Systems  Constitutional:  Negative for appetite change, chills, fatigue, fever and unexpected weight change.  HENT:   Negative for hearing loss and voice change.   Eyes:  Negative for eye problems and icterus.  Respiratory:  Negative for chest tightness, cough and shortness of breath.   Cardiovascular:  Negative  for chest pain and leg swelling.  Gastrointestinal:  Negative for abdominal distention and abdominal pain.  Endocrine: Negative for hot flashes.  Genitourinary:  Negative for difficulty urinating, dysuria and frequency.   Musculoskeletal:  Negative for arthralgias.  Skin:  Negative for itching and rash.  Neurological:  Negative for light-headedness and numbness.  Hematological:  Negative for adenopathy. Does not bruise/bleed easily.  Psychiatric/Behavioral:  Negative for confusion.     MEDICAL HISTORY:  Past Medical History:  Diagnosis Date   Allergy    Avascular necrosis of bones of both hips (HCC)    GERD (gastroesophageal reflux disease)    Migraine    Neutropenia (HCC)    Osteoarthritis of both hips    Plantar fasciitis    Seizures (HCC)    x 2 03/2021   Trochanteric bursitis of right hip     SURGICAL HISTORY: Past Surgical History:  Procedure Laterality Date   HERNIA REPAIR     HIP SURGERY     IR ANGIO INTRA EXTRACRAN SEL COM CAROTID INNOMINATE BILAT MOD SED  04/08/2021   IR ANGIO VERTEBRAL SEL SUBCLAVIAN INNOMINATE BILAT MOD SED  04/08/2021   IR US GUIDE VASC ACCESS RIGHT  04/08/2021    SOCIAL HISTORY: Social History   Socioeconomic History   Marital status: Single    Spouse name: Not on file   Number of children: 0   Years of education: Not on file   Highest education level: 10th grade  Occupational History    Comment: NA  Tobacco Use   Smoking status: Former    Packs/day: 1.00  Types: Cigarettes    Passive exposure: Never   Smokeless tobacco: Former    Types: Chew   Tobacco comments:    quit 10-15 years ago  Vaping Use   Vaping Use: Never used  Substance and Sexual Activity   Alcohol use: Not Currently   Drug use: No   Sexual activity: Not Currently  Other Topics Concern   Not on file  Social History Narrative   Not on file   Social Determinants of Health   Financial Resource Strain: Not on file  Food Insecurity: Not on file  Transportation  Needs: Not on file  Physical Activity: Not on file  Stress: Not on file  Social Connections: Not on file  Intimate Partner Violence: Not on file    FAMILY HISTORY: Family History  Problem Relation Age of Onset   Arthritis Mother    Stroke Mother    Diabetes Father    Heart disease Father    Hyperlipidemia Father    Hypertension Father    Kidney disease Maternal Grandfather    Colon cancer Neg Hx    Stomach cancer Neg Hx    Rectal cancer Neg Hx     ALLERGIES:  is allergic to aspirin.  MEDICATIONS:  Current Outpatient Medications  Medication Sig Dispense Refill   atorvastatin (LIPITOR) 10 MG tablet TAKE 1 TABLET BY MOUTH EVERY DAY 90 tablet 2   Blood Glucose Monitoring Suppl (ONETOUCH VERIO) w/Device KIT Used to check blood sugar once a day. DX E11.9 1 kit 0   glucose blood (ONETOUCH VERIO) test strip Check blood sugar using 1 strip daily for blood sugar monitoring related to DM 2 (E11.9) 100 strip 1   lamoTRIgine (LAMICTAL) 100 MG tablet Take 1 tablet (100 mg total) by mouth every morning AND 2 tablets (200 mg total) at bedtime. 270 tablet 3   OneTouch Delica Lancets 38U MISC Use to check blood sugar once a day.  DX: E11.9 100 each 1   Rimegepant Sulfate (NURTEC) 75 MG TBDP Take 1 tab at onset of migraine.  May repeat in 2 hrs, if needed.  Max dose: 2 tabs/day. This is a 30 day prescription. 12 tablet 11   No current facility-administered medications for this visit.     PHYSICAL EXAMINATION: ECOG PERFORMANCE STATUS: 0 - Asymptomatic Vitals:   06/30/22 1437  BP: 115/79  Pulse: 79  Temp: 97.6 F (36.4 C)   Filed Weights   06/30/22 1437  Weight: 192 lb (87.1 kg)    Physical Exam Constitutional:      General: He is not in acute distress. HENT:     Head: Normocephalic and atraumatic.  Eyes:     General: No scleral icterus. Cardiovascular:     Rate and Rhythm: Normal rate and regular rhythm.     Heart sounds: Normal heart sounds.  Pulmonary:     Effort:  Pulmonary effort is normal. No respiratory distress.     Breath sounds: No wheezing.  Abdominal:     General: Bowel sounds are normal. There is no distension.     Palpations: Abdomen is soft.  Musculoskeletal:        General: No deformity. Normal range of motion.     Cervical back: Normal range of motion and neck supple.  Skin:    General: Skin is warm and dry.     Findings: No erythema or rash.  Neurological:     Mental Status: He is alert and oriented to person, place, and time. Mental status  is at baseline.     Cranial Nerves: No cranial nerve deficit.     Coordination: Coordination normal.  Psychiatric:        Mood and Affect: Mood normal.     RADIOGRAPHIC STUDIES: I have personally reviewed the radiological images as listed and agreed with the findings in the report.     Latest Ref Rng & Units 06/12/2022   10:01 AM  CMP  Glucose 65 - 99 mg/dL 183   BUN 7 - 25 mg/dL 20   Creatinine 0.70 - 1.35 mg/dL 0.79   Sodium 135 - 146 mmol/L 140   Potassium 3.5 - 5.3 mmol/L 4.8   Chloride 98 - 110 mmol/L 105   CO2 20 - 32 mmol/L 27   Calcium 8.6 - 10.3 mg/dL 9.9   Total Protein 6.1 - 8.1 g/dL 7.1   Total Bilirubin 0.2 - 1.2 mg/dL 0.5   AST 10 - 35 U/L 14   ALT 9 - 46 U/L 19       Latest Ref Rng & Units 06/30/2022    2:15 PM  CBC  WBC 4.0 - 10.5 K/uL 4.0   Hemoglobin 13.0 - 17.0 g/dL 14.3   Hematocrit 39.0 - 52.0 % 41.8   Platelets 150 - 400 K/uL 193     LABORATORY DATA:  I have reviewed the data as listed Lab Results  Component Value Date   WBC 4.0 06/30/2022   HGB 14.3 06/30/2022   HCT 41.8 06/30/2022   MCV 85.7 06/30/2022   PLT 193 06/30/2022   Recent Labs    07/16/21 0813 07/16/21 0813 08/09/21 1621 11/13/21 1016 12/15/21 1304 06/12/22 1001  NA 141   < > 136 139 138 140  K 4.5  --  4.5 5.0 5.0 4.8  CL 102  --  102 103 104 105  CO2 26  --  '26 28 27 27  ' GLUCOSE 173*   < > 147* 158* 116* 183*  BUN 20   < > 16 22 27* 20  CREATININE 0.99   < > 1.15 0.93  1.18 0.79  CALCIUM 9.4  --  8.6 9.3 9.5 9.9  GFRNONAA  --   --   --   --  >60  --   PROT 7.1  --  6.3 6.7  --  7.1  ALBUMIN 4.7  --   --   --   --   --   AST 13  --  48* 12  --  14  ALT 19  --  57* 16  --  19  ALKPHOS 66  --   --   --   --   --   BILITOT 0.3  --  0.5 0.4  --  0.5   < > = values in this interval not displayed.    Iron/TIBC/Ferritin/ %Sat    Component Value Date/Time   FERRITIN 222 04/28/2022 1559     RADIOGRAPHIC STUDIES: I have personally reviewed the radiological images as listed and agreed with the findings in the report. MR BRAIN W WO CONTRAST  Result Date: 05/08/2022 CLINICAL DATA:  Seizure, focal. Patient repeatedly fell asleep and was moving during the exam. EXAM: MRI HEAD WITHOUT AND WITH CONTRAST TECHNIQUE: Multiplanar, multiecho pulse sequences of the brain and surrounding structures were obtained without and with intravenous contrast. CONTRAST:  26m MULTIHANCE GADOBENATE DIMEGLUMINE 529 MG/ML IV SOLN COMPARISON:  MR head without and with contrast 12/26/2021 and 04/06/2021 FINDINGS: Brain: Leptomeningeal enhancement over the anterior left  frontal lobe is stable. Some increased T2/FLAIR signal is present in the associated cortex. No significant remote blood products are present. The diffusion-weighted images demonstrate no focal restricted diffusion. Moderate periventricular T2 hyperintensities are present, moderately advanced for age. The ventricles are of normal size. No significant extraaxial fluid collection is present. The internal auditory canals are within normal limits. The brainstem and cerebellum are within normal limits. Vascular: Flow is present in the major intracranial arteries. Skull and upper cervical spine: The craniocervical junction is normal. Upper cervical spine is within normal limits. Marrow signal is unremarkable. Sinuses/Orbits: The paranasal sinuses and mastoid air cells are clear. Left lens replacement is present. Globes and orbits are  otherwise within normal limits. IMPRESSION: 1. Stable cortical and leptomeningeal enhancement over the anterior left frontal lobe. This may be related to prior trauma or infection. Granulomatous meningitis is considered less likely. No restricted diffusion is present to suggest active disease. Post ischemic changes are considered less likely. 2. No acute intracranial abnormality or significant interval change. 3. Moderate periventricular T2 hyperintensities bilaterally are moderately advanced for age. The finding is nonspecific but can be seen in the setting of chronic microvascular ischemia, a demyelinating process such as multiple sclerosis, vasculitis, complicated migraine headaches, or as the sequelae of a prior infectious or inflammatory process. Electronically Signed   By: San Morelle M.D.   On: 05/08/2022 14:24   08/26/2021, ultrasound abdomen limited right upper quadrant showed cholelithiasis without sonographic evidence of acute cholecystitis.   ASSESSMENT & PLAN:  1. AVN (avascular necrosis of bone) ([place])    #Chronic leukopenia was predominantly neutropenia, since May 2022.    Today's blood work showed complete normal white blood cell with normal differential. This may be secondary to discontinuation of Depakote or due to recent steroid injection. Continue observation. Follow-up in 1 year. Orders Placed This Encounter  Procedures   Comprehensive metabolic panel    Standing Status:   Future    Standing Expiration Date:   07/01/2023   CBC with Differential/Platelet    Standing Status:   Future    Standing Expiration Date:   07/01/2023   Folate    Standing Status:   Future    Standing Expiration Date:   07/01/2023   Vitamin B12    Standing Status:   Future    Standing Expiration Date:   07/01/2023    All questions were answered. The patient knows to call the clinic with any problems questions or concerns.  Cc Baity, Coralie Keens, NP       Earlie Server, MD, PhD [facility]  Hematology Oncology 06/30/2022

## 2022-07-17 ENCOUNTER — Telehealth: Payer: Self-pay

## 2022-07-17 NOTE — Telephone Encounter (Signed)
PA for nurtec has been sent via cmm.  (Key: [id number])  Your information has been submitted to [place] [employer]. [employer] will review the request and will issue a decision, typically within 1-3 days from your submission. You can check the updated outcome later by reopening this request.  If [employer] has not responded in 1-3 days or if you have any questions about your ePA request, please contact [place] [employer] Part [employer] at [phone]. If you think there may be a problem with your PA request, use our live chat feature at the bottom right.

## 2022-07-17 NOTE — Telephone Encounter (Signed)
Thank you for trusting your Medicare prescription drug coverage to [employer] ([employer] As our member, we want to help you get the most value from your prescription drug coverage and help you understand how your coverage works. As a member of [employer] ([place]), we are pleased to inform you that, upon review of the information provided by you or your doctor, we have approved the requested coverage for the following prescription drug(s): NURTEC Tablet Disintegrating Type of coverage approved: Prior Authorization This approval authorizes your coverage from 11/17/2021 - 07/17/2023, unless we notify you otherwise, and as long as the following conditions apply:

## 2022-07-25 ENCOUNTER — Ambulatory Visit (INDEPENDENT_AMBULATORY_CARE_PROVIDER_SITE_OTHER): Payer: Medicare Other

## 2022-07-25 VITALS — BP 98/60 | Ht 75.0 in | Wt 193.8 lb

## 2022-07-25 DIAGNOSIS — Z0000 Encounter for general adult medical examination without abnormal findings: Secondary | ICD-10-CM | POA: Diagnosis not present

## 2022-07-25 NOTE — Progress Notes (Signed)
Subjective:   Ralph Simpson is a 61 y.o. male who presents for Medicare Annual/Subsequent preventive examination.  Review of Systems     Cardiac Risk Factors include: male gender;advanced age (>76mn, >>50women)     Objective:    Today's Vitals   07/25/22 0850  BP: 98/60  Weight: 193 lb 12.8 oz (87.9 kg)  Height: '6\' 3"'  (1.905 m)   Body mass index is 24.22 kg/m.     07/25/2022    8:57 AM 06/30/2022    2:35 PM 12/30/2021    2:55 PM 12/15/2021   12:26 PM 08/04/2021    6:51 PM  Advanced Directives  Does Patient Have a Medical Advance Directive? No No No No No  Would patient like information on creating a medical advance directive? No - Patient declined  No - Patient declined No - Patient declined No - Patient declined    Current Medications (verified) Outpatient Encounter Medications as of 07/25/2022  Medication Sig   atorvastatin (LIPITOR) 10 MG tablet TAKE 1 TABLET BY MOUTH EVERY DAY   Blood Glucose Monitoring Suppl (ONETOUCH VERIO) w/Device KIT Used to check blood sugar once a day. DX E11.9   glucose blood (ONETOUCH VERIO) test strip Check blood sugar using 1 strip daily for blood sugar monitoring related to DM 2 (E11.9)   lamoTRIgine (LAMICTAL) 100 MG tablet Take 1 tablet (100 mg total) by mouth every morning AND 2 tablets (200 mg total) at bedtime.   ondansetron (ZOFRAN-ODT) 4 MG disintegrating tablet Take by mouth.   OneTouch Delica Lancets 316BMISC Use to check blood sugar once a day.  DX: E11.9   Rimegepant Sulfate (NURTEC) 75 MG TBDP Take 1 tab at onset of migraine.  May repeat in 2 hrs, if needed.  Max dose: 2 tabs/day. This is a 30 day prescription.   Sodium Sulfate-Mag Sulfate-KCl (SUTAB) [phone] TABS See admin instructions.   divalproex (DEPAKOTE) 500 MG DR tablet Take by mouth. (Patient not taking: Reported on 07/25/2022)   pantoprazole (PROTONIX) 40 MG tablet Take by mouth. (Patient not taking: Reported on 07/25/2022)   No facility-administered encounter  medications on file as of 07/25/2022.    Allergies (verified) Aspirin   History: Past Medical History:  Diagnosis Date   Allergy    Avascular necrosis of bones of both hips (HCC)    GERD (gastroesophageal reflux disease)    Migraine    Neutropenia (HCC)    Osteoarthritis of both hips    Plantar fasciitis    Seizures (HCC)    x 2 03/2021   Trochanteric bursitis of right hip    Past Surgical History:  Procedure Laterality Date   HERNIA REPAIR     HIP SURGERY     IR ANGIO INTRA EXTRACRAN SEL COM CAROTID INNOMINATE BILAT MOD SED  04/08/2021   IR ANGIO VERTEBRAL SEL SUBCLAVIAN INNOMINATE BILAT MOD SED  04/08/2021   IR [place] VASC ACCESS RIGHT  04/08/2021   Family History  Problem Relation Age of Onset   Arthritis Mother    Stroke Mother    Diabetes Father    Heart disease Father    Hyperlipidemia Father    Hypertension Father    Kidney disease Maternal Grandfather    Colon cancer Neg Hx    Stomach cancer Neg Hx    Rectal cancer Neg Hx    Social History   Socioeconomic History   Marital status: Single    Spouse name: Not on file   Number of  children: 0   Years of education: Not on file   Highest education level: 10th grade  Occupational History    Comment: NA  Tobacco Use   Smoking status: Former    Packs/day: 1.00    Types: Cigarettes    Passive exposure: Never   Smokeless tobacco: Former    Types: Chew   Tobacco comments:    quit 10-15 years ago  Vaping Use   Vaping Use: Never used  Substance and Sexual Activity   Alcohol use: Not Currently   Drug use: No   Sexual activity: Not Currently  Other Topics Concern   Not on file  Social History Narrative   Not on file   Social Determinants of Health   Financial Resource Strain: Low Risk  (07/25/2022)   Overall Financial Resource Strain (CARDIA)    Difficulty of Paying Living Expenses: Not hard at all  Food Insecurity: No Food Insecurity (07/25/2022)   Hunger Vital Sign    Worried About Running Out of Food  in the Last Year: Never true    [place] in the Last Year: Never true  Transportation Needs: No Transportation Needs (07/25/2022)   PRAPARE - Hydrologist (Medical): No    Lack of Transportation (Non-Medical): No  Physical Activity: Inactive (07/25/2022)   Exercise Vital Sign    Days of Exercise per Week: 0 days    Minutes of Exercise per Session: 0 min  Stress: No Stress Concern Present (07/25/2022)   [place]    Feeling of Stress : Not at all  Social Connections: Socially Isolated (07/25/2022)   Social Connection and Isolation Panel [NHANES]    Frequency of Communication with Friends and Family: More than three times a week    Frequency of Social Gatherings with Friends and Family: More than three times a week    Attends Religious Services: Never    Marine scientist or Organizations: No    Attends Music therapist: Never    Marital Status: Never married    Tobacco Counseling Counseling given: Not Answered Tobacco comments: quit 10-15 years ago   Clinical Intake:  Pre-visit preparation completed: Yes  Pain : No/denies pain     Diabetes: Yes CBG done?: No Did pt. bring in CBG monitor from home?: No  How often do you need to have someone help you when you read instructions, pamphlets, or other written materials from your doctor or pharmacy?: 1 - Never  Diabetic?yes Nutrition Risk Assessment:  Has the patient had any N/V/D within the last 2 months?  No  Does the patient have any non-healing wounds?  No  Has the patient had any unintentional weight loss or weight gain?  No   Diabetes:  Is the patient diabetic?  Yes  If diabetic, was a CBG obtained today?  No  Did the patient bring in their glucometer from home?  No  How often do you monitor your CBG's? Once per day.   Financial Strains and Diabetes Management:  Are you having any financial strains  with the device, your supplies or your medication? No .  Does the patient want to be seen by Chronic Care Management for management of their diabetes?  No  Would the patient like to be referred to a Nutritionist or for Diabetic Management?  No   Diabetic Exams:  Diabetic Eye Exam: Completed 01/02/22.  Pt has been advised about the importance  in completing this exam.   Diabetic Foot Exam: Completed 11/15/21. Pt has been advised about the importance in completing this exam.   Interpreter Needed?: No  Information entered by :: Kirke Shaggy, LPN   Activities of Daily Living    07/25/2022    8:59 AM 06/12/2022   11:10 AM  In your present state of health, do you have any difficulty performing the following activities:  Hearing? 1 1  Vision? 1 1  Difficulty concentrating or making decisions? 1 1  Walking or climbing stairs? 1 1  Dressing or bathing? 1 1  Doing errands, shopping? 1 1  Preparing Food and eating ? N   Using the Toilet? N   In the past six months, have you accidently leaked urine? N   Do you have problems with loss of bowel control? N   Managing your Medications? N   Managing your Finances? N   Housekeeping or managing your Housekeeping? N     Patient Care Team: Jearld Fenton, NP as PCP - General (Internal Medicine) Earlie Server, MD as Consulting Physician (Hematology and Oncology)  Indicate any recent Medical Services you may have received from other than [facility] providers in the past year (date may be approximate).     Assessment:   This is a routine wellness examination for Ralph Simpson.  Hearing/Vision screen Hearing Screening - Comments:: No aids Vision Screening - Comments:: Readers- Dr.Groat  Dietary issues and exercise activities discussed: Current Exercise Habits: The patient does not participate in regular exercise at present   Goals Addressed             This Visit's Progress    DIET - EAT MORE FRUITS AND VEGETABLES         Depression Screen     07/25/2022    8:55 AM 06/12/2022   11:09 AM 08/09/2021    4:15 PM 03/05/2020    9:37 AM 01/27/2020    3:42 PM 01/23/2017    5:20 PM  PHQ 2/9 Scores  PHQ - 2 Score 1 0 0 0 0 0  PHQ- 9 Score 3 9 0 2      Fall Risk    07/25/2022    8:58 AM 06/12/2022   11:09 AM 03/05/2020    9:37 AM 01/23/2017    5:20 PM  Fall Risk   Falls in the past year? 1 0 0 No  Number falls in past yr: 1 0    Injury with Fall? 0 0    Risk for fall due to : No Fall Risks;Medication side effect No Fall Risks    Follow up Falls prevention discussed;Falls evaluation completed Falls evaluation completed      FALL RISK PREVENTION PERTAINING TO THE HOME:  Any stairs in or around the home? Yes  If so, are there any without handrails? No  Home free of loose throw rugs in walkways, pet beds, electrical cords, etc? Yes  Adequate lighting in your home to reduce risk of falls? Yes   ASSISTIVE DEVICES UTILIZED TO PREVENT FALLS:  Life alert? No  Use of a cane, walker or w/c? Yes  Grab bars in the bathroom? No  Shower chair or bench in shower? No  Elevated toilet seat or a handicapped toilet? No   TIMED UP AND GO:  Was the test performed? Yes .  Length of time to ambulate 10 feet: 4 sec.   Gait steady and fast with assistive device  Cognitive Function:      04/28/2022  3:00 PM  Montreal Cognitive Assessment   Visuospatial/ Executive (0/5) 4  Naming (0/3) 3  Attention: Read list of digits (0/2) 2  Attention: Read list of letters (0/1) 1  Attention: Serial 7 subtraction starting at 100 (0/3) 3  Language: Repeat phrase (0/2) 2  Language : Fluency (0/1) 0  Abstraction (0/2) 1  Delayed Recall (0/5) 1  Orientation (0/6) 6  Total 23      Immunizations Immunization History  Administered Date(s) Administered   Tdap 11/13/2021   Zoster Recombinat (Shingrix) 11/13/2021, 02/13/2022    TDAP status: Up to date  Flu Vaccine status: Declined, Education has been provided regarding the importance of this vaccine but  patient still declined. Advised may receive this vaccine at local pharmacy or Health Dept. Aware to provide a copy of the vaccination record if obtained from local pharmacy or Health Dept. Verbalized acceptance and understanding.  Pneumococcal vaccine status: Declined,  Education has been provided regarding the importance of this vaccine but patient still declined. Advised may receive this vaccine at local pharmacy or Health Dept. Aware to provide a copy of the vaccination record if obtained from local pharmacy or Health Dept. Verbalized acceptance and understanding.   Covid-19 vaccine status: Declined, Education has been provided regarding the importance of this vaccine but patient still declined. Advised may receive this vaccine at local pharmacy or Health Dept.or vaccine clinic. Aware to provide a copy of the vaccination record if obtained from local pharmacy or Health Dept. Verbalized acceptance and understanding.  Qualifies for Shingles Vaccine? Yes   Zostavax completed No   Shingrix Completed?: Yes  Screening Tests Health Maintenance  Topic Date Due   COVID-19 Vaccine (1) Never done   INFLUENZA VACCINE  Never done   HEMOGLOBIN A1C  10/28/2022   FOOT EXAM  11/15/2022   OPHTHALMOLOGY EXAM  01/02/2023   URINE MICROALBUMIN  02/22/2023   COLONOSCOPY (Pts 45-59yr Insurance coverage will need to be confirmed)  07/29/2028   TETANUS/TDAP  11/14/2031   Hepatitis C Screening  Completed   HIV Screening  Completed   Zoster Vaccines- Shingrix  Completed   HPV VACCINES  Aged Out    Health Maintenance  Health Maintenance Due  Topic Date Due   COVID-19 Vaccine (1) Never done   INFLUENZA VACCINE  Never done    Colorectal cancer screening: Type of screening: Colonoscopy. Completed 07/29/21. Repeat every 7 years  Lung Cancer Screening: (Low Dose CT Chest recommended if Age 851-80years, 30 pack-year currently smoking OR have quit w/in 15years.) does not qualify.   Additional  Screening:  Hepatitis C Screening: does qualify; Completed 08/12/21  Vision Screening: Recommended annual ophthalmology exams for early detection of glaucoma and other disorders of the eye. Is the patient up to date with their annual eye exam?  No  Who is the provider or what is the name of the office in which the patient attends annual eye exams?  If pt is not established with a provider, would they like to be referred to a provider to establish care? No .   Dental Screening: Recommended annual dental exams for proper oral hygiene  Community Resource Referral / Chronic Care Management: CRR required this visit?  No   CCM required this visit?  No      Plan:     I have personally reviewed and noted the following in the patient's chart:   Medical and social history Use of alcohol, tobacco or illicit drugs  Current medications and supplements including opioid  prescriptions. Patient is not currently taking opioid prescriptions. Functional ability and status Nutritional status Physical activity Advanced directives List of other physicians Hospitalizations, surgeries, and ER visits in previous 12 months Vitals Screenings to include cognitive, depression, and falls Referrals and appointments  In addition, I have reviewed and discussed with patient certain preventive protocols, quality metrics, and best practice recommendations. A written personalized care plan for preventive services as well as general preventive health recommendations were provided to patient.     Dionisio David, LPN   7/0/9643   Nurse Notes: none

## 2022-07-25 NOTE — Patient Instructions (Signed)
Ralph Simpson , Thank you for taking time to come for your Medicare Wellness Visit. I appreciate your ongoing commitment to your health goals. Please review the following plan we discussed and let me know if I can assist you in the future.   Screening recommendations/referrals: Colonoscopy: 07/29/21 Recommended yearly ophthalmology/optometry visit for glaucoma screening and checkup Recommended yearly dental visit for hygiene and checkup  Vaccinations: Influenza vaccine: n/d Pneumococcal vaccine: n/d Tdap vaccine: 11/13/21 Shingles vaccine: Shingrix 11/13/21, 02/13/22   Covid-19: n/d  Advanced directives: no  Conditions/risks identified: none  Next appointment: Follow up in one year for your annual wellness visit 07/31/23 @ 9 am in person  Preventive Care 40-64 Years, Male Preventive care refers to lifestyle choices and visits with your health care provider that can promote health and wellness. What does preventive care include? A yearly physical exam. This is also called an annual well check. Dental exams once or twice a year. Routine eye exams. Ask your health care provider how often you should have your eyes checked. Personal lifestyle choices, including: Daily care of your teeth and gums. Regular physical activity. Eating a healthy diet. Avoiding tobacco and drug use. Limiting alcohol use. Practicing safe sex. Taking low-dose aspirin every day starting at age 44. What happens during an annual well check? The services and screenings done by your health care provider during your annual well check will depend on your age, overall health, lifestyle risk factors, and family history of disease. Counseling  Your health care provider may ask you questions about your: Alcohol use. Tobacco use. Drug use. Emotional well-being. Home and relationship well-being. Sexual activity. Eating habits. Work and work Statistician. Screening  You may have the following tests or  measurements: Height, weight, and BMI. Blood pressure. Lipid and cholesterol levels. These may be checked every 5 years, or more frequently if you are over 5 years old. Skin check. Lung cancer screening. You may have this screening every year starting at age 32 if you have a 30-pack-year history of smoking and currently smoke or have quit within the past 15 years. Fecal occult blood test (FOBT) of the stool. You may have this test every year starting at age 80. Flexible sigmoidoscopy or colonoscopy. You may have a sigmoidoscopy every 5 years or a colonoscopy every 10 years starting at age 29. Prostate cancer screening. Recommendations will vary depending on your family history and other risks. Hepatitis C blood test. Hepatitis B blood test. Sexually transmitted disease (STD) testing. Diabetes screening. This is done by checking your blood sugar (glucose) after you have not eaten for a while (fasting). You may have this done every 1-3 years. Discuss your test results, treatment options, and if necessary, the need for more tests with your health care provider. Vaccines  Your health care provider may recommend certain vaccines, such as: Influenza vaccine. This is recommended every year. Tetanus, diphtheria, and acellular pertussis (Tdap, Td) vaccine. You may need a Td booster every 10 years. Zoster vaccine. You may need this after age 36. Pneumococcal 13-valent conjugate (PCV13) vaccine. You may need this if you have certain conditions and have not been vaccinated. Pneumococcal polysaccharide (PPSV23) vaccine. You may need one or two doses if you smoke cigarettes or if you have certain conditions. Talk to your health care provider about which screenings and vaccines you need and how often you need them. This information is not intended to replace advice given to you by your health care provider. Make sure you discuss any questions you  have with your health care provider. Document Released:  11/30/2015 Document Revised: 07/23/2016 Document Reviewed: 09/04/2015 Elsevier Interactive Patient Education  2017 [place] Prevention in the Home Falls can cause injuries. They can happen to people of all ages. There are many things you can do to make your home safe and to help prevent falls. What can I do on the outside of my home? Regularly fix the edges of walkways and driveways and fix any cracks. Remove anything that might make you trip as you walk through a door, such as a raised step or threshold. Trim any bushes or trees on the path to your home. Use bright outdoor lighting. Clear any walking paths of anything that might make someone trip, such as rocks or tools. Regularly check to see if handrails are loose or broken. Make sure that both sides of any steps have handrails. Any raised decks and porches should have guardrails on the edges. Have any leaves, snow, or ice cleared regularly. Use sand or salt on walking paths during winter. Clean up any spills in your garage right away. This includes oil or grease spills. What can I do in the bathroom? Use night lights. Install grab bars by the toilet and in the tub and shower. Do not use towel bars as grab bars. Use non-skid mats or decals in the tub or shower. If you need to sit down in the shower, use a plastic, non-slip stool. Keep the floor dry. Clean up any water that spills on the floor as soon as it happens. Remove soap buildup in the tub or shower regularly. Attach bath mats securely with double-sided non-slip rug tape. Do not have throw rugs and other things on the floor that can make you trip. What can I do in the bedroom? Use night lights. Make sure that you have a light by your bed that is easy to reach. Do not use any sheets or blankets that are too big for your bed. They should not hang down onto the floor. Have a firm chair that has side arms. You can use this for support while you get dressed. Do not have  throw rugs and other things on the floor that can make you trip. What can I do in the kitchen? Clean up any spills right away. Avoid walking on wet floors. Keep items that you use a lot in easy-to-reach places. If you need to reach something above you, use a strong step stool that has a grab bar. Keep electrical cords out of the way. Do not use floor polish or wax that makes floors slippery. If you must use wax, use non-skid floor wax. Do not have throw rugs and other things on the floor that can make you trip. What can I do with my stairs? Do not leave any items on the stairs. Make sure that there are handrails on both sides of the stairs and use them. Fix handrails that are broken or loose. Make sure that handrails are as long as the stairways. Check any carpeting to make sure that it is firmly attached to the stairs. Fix any carpet that is loose or worn. Avoid having throw rugs at the top or bottom of the stairs. If you do have throw rugs, attach them to the floor with carpet tape. Make sure that you have a light switch at the top of the stairs and the bottom of the stairs. If you do not have them, ask someone to add them for you.  What else can I do to help prevent falls? Wear shoes that: Do not have high heels. Have rubber bottoms. Are comfortable and fit you well. Are closed at the toe. Do not wear sandals. If you use a stepladder: Make sure that it is fully opened. Do not climb a closed stepladder. Make sure that both sides of the stepladder are locked into place. Ask someone to hold it for you, if possible. Clearly mark and make sure that you can see: Any grab bars or handrails. First and last steps. Where the edge of each step is. Use tools that help you move around (mobility aids) if they are needed. These include: Canes. Walkers. Scooters. Crutches. Turn on the lights when you go into a dark area. Replace any light bulbs as soon as they burn out. Set up your furniture so  you have a clear path. Avoid moving your furniture around. If any of your floors are uneven, fix them. If there are any pets around you, be aware of where they are. Review your medicines with your doctor. Some medicines can make you feel dizzy. This can increase your chance of falling. Ask your doctor what other things that you can do to help prevent falls. This information is not intended to replace advice given to you by your health care provider. Make sure you discuss any questions you have with your health care provider. Document Released: 08/30/2009 Document Revised: 04/10/2016 Document Reviewed: 12/08/2014 Elsevier Interactive Patient Education  2017 [employer].

## 2022-07-30 ENCOUNTER — Ambulatory Visit (INDEPENDENT_AMBULATORY_CARE_PROVIDER_SITE_OTHER): Payer: Medicare Other | Admitting: Neurology

## 2022-07-30 VITALS — BP 110/70 | HR 69 | Ht 75.0 in | Wt 193.0 lb

## 2022-07-30 DIAGNOSIS — G43709 Chronic migraine without aura, not intractable, without status migrainosus: Secondary | ICD-10-CM | POA: Diagnosis not present

## 2022-07-30 DIAGNOSIS — R9089 Other abnormal findings on diagnostic imaging of central nervous system: Secondary | ICD-10-CM

## 2022-07-30 DIAGNOSIS — G40209 Localization-related (focal) (partial) symptomatic epilepsy and epileptic syndromes with complex partial seizures, not intractable, without status epilepticus: Secondary | ICD-10-CM

## 2022-07-30 NOTE — Progress Notes (Signed)
ASSESSMENT AND PLAN 61 y.o. year old male  55.  Complex partial seizures in September 2021, 2.  Long history of chronic migraine headaches 3.  Abnormal MRI of the brain  -Most recent MRI of the brain June 2023 showed stable cortical and leptomeningeal enhancement of the anterior left frontal lobe -He is doing well, no recurrent seizures, headaches controlled -Reviewed with Dr. Krista Blue, recommended repeat LP, patient wishes to hold off for now, questionable neuro sarcoid? We decided to repeat MRI brain with and without contrast in December, have him follow up with Dr. Krista Blue after to review the images  -Continue Lamictal 100/200 mg daily -Extensive laboratory evaluation was unrevealing -LP May 2022 during hospitalization, protein slightly elevated 51, RBC 2225 pink color  DIAGNOSTIC DATA (LABS, IMAGING, TESTING)    12/26/21 MRI brain (with and without) demonstrating: - Stable chronic leptomeningeal enhancement mainly in the left frontal region, but also slightly in the right frontal and bilateral parietal regions. There is associated T2FLAIR hyperintensity in the sulcal, juxtacortcal and periventricular regions. No SWI abnormalities to suggest hemosiderin and other mineralization. Would favor autoimmune, inflammatory or granulomatous etiologies. Overall no change from MRI on 04/06/21  Angiographically no evidence of arteriovenous malformation, dural AV fistula, aneurysms, dissections, or of abnormal filling defects.   Venous outflow grossly intact. Predominant outflow of both cerebral hemispheres is via the right transverse sinus and the right sigmoid sinus.   Spinal fluid testing in May 2022, elevated RBC 2225, WBC 2, total protein 51  Extensive laboratory evaluation 04/28/22: RPR, B12, CRP, TSH, CK, folate, sed rate, ANA, ferritin, CBC, Lyme, angiotensin-converting enzyme, ANCA, HIV, A1c showed mildly elevated A1c 6.0.  Depakote level was not detected, Lamictal was 5.7.  HISTORY  Ralph Simpson is a 61 year old male, seen in request by Dr. Roland Rack to follow-up hospital discharge on Apr 09, 2021, he is accompanied by his brother at today's visit on Apr 12, 2021   I reviewed and summarized the referring note. PMHx. He has past medical history of bilateral hip avascular necrosis, status post bilateral fibular grafting, chronic low back pain, on disability   He had long history of chronic migraine headaches, his typical migraine are severe holoacranial pounding headache with light, noise sensitivity, it can happen 1-3 times a week, he used to take [facility] powder, now he take Advil, at least 2 doses of Excedrin Migraine with each episode of headache, he also has visual aura prior to his severe migraine headaches, he see tunnel vision, lose vision before headaches,   He reported frequent headaches over the past couple years, had his first right arm on controllable right arm jerking movement in September 2021, only last for few minutes, is associated with his severe headache,   He had similar episode in March 2022, he again presenting with severe migraine headaches, with uncontrollable right arm jerking, word finding difficulties, was brought in by his girlfriend to the emergency room, while in the waiting area, he had another episode   CT of the head showed left frontal subarachnoid hemorrhage   Which is confirmed by MRI of the brain with without contrast, area of subarachnoid/p.o. contrast enhancement over both convexities, left more than right, reactive enhancement in the setting of subarachnoid hemorrhage is favored   He had a CT angiogram of head and neck was normal   Also four-vessel angiogram on Apr 10, 2021 showed no evidence of vascular malformation   Extensive laboratory evaluations showed no significant abnormality, other than slight elevated  scleroderma IgG antibody   Spinal fluid testing showed significantly elevated RBC 2225 at tube 4, slight elevated total  protein of 51, with otherwise no significant abnormalities   He is discharge with Depakote DR 500 mg twice a day, overall tolerating medication, complains of some fatigue, he had long history of difficulty sleeping due to chronic hip pain, tends to take frequent naps in his chair instead of lying in bed sleep   UPDATE July 16 2021: He had recurrent partial seizure on July 6 and July 12, transient right arm shaking, lasting for few minutes, no dysarthria no confusion, he has some side effect with Depakote ER 500 mg 2 tablets every night, complains of dizziness, but still taking it,  Gait abnormality due to hip pain, pending upper and lower GI study   Extensive laboratory evaluation in May 2022 showed normal or negative ESR C-reactive protein ANA hypercoagulable panel, Lyme titer, low titer scleroderma antibody, angiotension enzyme, rheumatoid factor,  UPDATE April 28 2022: He is accompanied by his significant other [name] at today's visit, who has known him for more than 20 years, reported he did have word finding difficulty, mild short-term memory loss over the past few months, MoCA examination 23/30,  He was disabled as a Nature conservation officer due to bilateral hip avascular necrosis,  He has no recurrent seizure since last visit, taking lamotrigine 100 mg 3 tablets at nighttime, sometimes forgot taking medications, continue has intermittent headache, only has to take Roselyn Meier couple times over the last 4 months, worked well for him  He used to enjoy fishing, but now with his seizure spells, he become more sedentary, worried about recurrent spells, spent most of the time watching TV  He was seen by oncologist NP for leukopenia, thought due to epileptic medications, Depakote has tapered off   We personally reviewed MRI of the brain with without contrast in February 2023, compared to previous MRI in May 2022, there was no significant change, stable chronic leptomeningeal enhancement, mainly in the  left frontal region, but also slightly in the right frontal and bilateral parietal region, there is associated T2/FLAIR hyperintensity in the sulcal, juxtacortical, and periventricular regions, no SWI abnormality to suggest hemosiderin, above findings argue against the possibility of subarachnoid hemorrhage, despite previous spinal fluid testing in May 2022, showed pink spinal fluid, hazy, RBC was 2225, WBC was 2, could not rule out possibility of bloody tap, total protein was mildly elevated 51 at that time  EEG in May showed left cortical dysfunction, intermittent slow, maximal at the left temporal region, repeat EEG in June 2023 was normal  Update July 30, 2022 SS: Updated MRI of the brain in June 2023 showed stable cortical and leptomeningeal enhancement over the left anterior frontal lobe.  LP was suggested, he decided to hold off. Remains on Lamictal 100/200, no issues. Has hip pain, uses a cane. No new neuro symptoms. Is more alert since coming off Depakote. Denies any migraines.   PHYSICAL EXAM  Vitals:   04/28/22 1507  BP: 97/65  Pulse: 82  Weight: 192 lb (87.1 kg)  Height: '6\' 3"'  (1.905 m)   Body mass index is 24 kg/m.   PHYSICAL EXAMNIATION:  Gen: NAD, conversant, well nourised, well groomed                     Cardiovascular: Regular rate rhythm, no peripheral edema, warm, nontender. Eyes: Conjunctivae clear without exudates or hemorrhage Pulmonary: Clear to auscultation bilaterally   NEUROLOGICAL EXAM:  MENTAL  STATUS: Speech/cognition: Awake, alert oriented to history taking and casual conversation    04/28/2022    3:00 PM  Montreal Cognitive Assessment   Visuospatial/ Executive (0/5) 4  Naming (0/3) 3  Attention: Read list of digits (0/2) 2  Attention: Read list of letters (0/1) 1  Attention: Serial 7 subtraction starting at 100 (0/3) 3  Language: Repeat phrase (0/2) 2  Language : Fluency (0/1) 0  Abstraction (0/2) 1  Delayed Recall (0/5) 1  Orientation  (0/6) 6  Total 23     CRANIAL NERVES: CN II: Visual fields are full to confrontation.  Pupils are round equal and briskly reactive to light. CN III, IV, VI: extraocular movement are normal. No ptosis. CN V: Facial sensation is intact to pinprick in all 3 divisions bilaterally.  CN VII: Face is symmetric with normal eye closure and smile. CN VIII: Hearing is normal to casual conversation CN XI: Head turning and shoulder shrug are intact   MOTOR: No significant muscle weakness was noted  REFLEXES: Normal  SENSORY: Intact to light touch, no extinction  COORDINATION: Finger-nose-finger and heel-to-shin are normal  GAIT/STANCE: Has to push off from seated position to stand, gait is antalgic with the left hip, uses single-point cane   REVIEW OF SYSTEMS: Out of a complete 14 system review of symptoms, the patient complains only of the following symptoms, and all other reviewed systems are negative.  See HPI  ALLERGIES: Allergies  Allergen Reactions   Aspirin     Causes brain bleed     HOME MEDICATIONS: Outpatient Medications Prior to Visit  Medication Sig Dispense Refill   atorvastatin (LIPITOR) 10 MG tablet TAKE 1 TABLET BY MOUTH EVERY DAY 90 tablet 2   Blood Glucose Monitoring Suppl (ONETOUCH VERIO) w/Device KIT Used to check blood sugar once a day. DX E11.9 1 kit 0   divalproex (DEPAKOTE) 500 MG DR tablet Take by mouth. (Patient not taking: Reported on 07/25/2022)     glucose blood (ONETOUCH VERIO) test strip Check blood sugar using 1 strip daily for blood sugar monitoring related to DM 2 (E11.9) 100 strip 1   lamoTRIgine (LAMICTAL) 100 MG tablet Take 1 tablet (100 mg total) by mouth every morning AND 2 tablets (200 mg total) at bedtime. 270 tablet 3   ondansetron (ZOFRAN-ODT) 4 MG disintegrating tablet Take by mouth.     OneTouch Delica Lancets 67R MISC Use to check blood sugar once a day.  DX: E11.9 100 each 1   pantoprazole (PROTONIX) 40 MG tablet Take by mouth. (Patient not  taking: Reported on 07/25/2022)     Rimegepant Sulfate (NURTEC) 75 MG TBDP Take 1 tab at onset of migraine.  May repeat in 2 hrs, if needed.  Max dose: 2 tabs/day. This is a 30 day prescription. 12 tablet 11   Sodium Sulfate-Mag Sulfate-KCl (SUTAB) [phone] MG TABS See admin instructions.     No facility-administered medications prior to visit.    PAST MEDICAL HISTORY: Past Medical History:  Diagnosis Date   Allergy    Avascular necrosis of bones of both hips (HCC)    GERD (gastroesophageal reflux disease)    Migraine    Neutropenia (HCC)    Osteoarthritis of both hips    Plantar fasciitis    Seizures (HCC)    x 2 03/2021   Trochanteric bursitis of right hip     PAST SURGICAL HISTORY: Past Surgical History:  Procedure Laterality Date   HERNIA REPAIR     HIP SURGERY  IR ANGIO INTRA EXTRACRAN SEL COM CAROTID INNOMINATE BILAT MOD SED  04/08/2021   IR ANGIO VERTEBRAL SEL SUBCLAVIAN INNOMINATE BILAT MOD SED  04/08/2021   IR US GUIDE VASC ACCESS RIGHT  04/08/2021    FAMILY HISTORY: Family History  Problem Relation Age of Onset   Arthritis Mother    Stroke Mother    Diabetes Father    Heart disease Father    Hyperlipidemia Father    Hypertension Father    Kidney disease Maternal Grandfather    Colon cancer Neg Hx    Stomach cancer Neg Hx    Rectal cancer Neg Hx     SOCIAL HISTORY: Social History   Socioeconomic History   Marital status: Single    Spouse name: Not on file   Number of children: 0   Years of education: Not on file   Highest education level: 10th grade  Occupational History    Comment: NA  Tobacco Use   Smoking status: Former    Packs/day: 1.00    Types: Cigarettes    Passive exposure: Never   Smokeless tobacco: Former    Types: Chew   Tobacco comments:    quit 10-15 years ago  Vaping Use   Vaping Use: Never used  Substance and Sexual Activity   Alcohol use: Not Currently   Drug use: No   Sexual activity: Not Currently  Other Topics  Concern   Not on file  Social History Narrative   Not on file   Social Determinants of Health   Financial Resource Strain: Low Risk  (07/25/2022)   Overall Financial Resource Strain (CARDIA)    Difficulty of Paying Living Expenses: Not hard at all  Food Insecurity: No Food Insecurity (07/25/2022)   Hunger Vital Sign    Worried About Running Out of Food in the Last Year: Never true    Ran Out of Food in the Last Year: Never true  Transportation Needs: No Transportation Needs (07/25/2022)   PRAPARE - Hydrologist (Medical): No    Lack of Transportation (Non-Medical): No  Physical Activity: Inactive (07/25/2022)   Exercise Vital Sign    Days of Exercise per Week: 0 days    Minutes of Exercise per Session: 0 min  Stress: No Stress Concern Present (07/25/2022)   [place]    Feeling of Stress : Not at all  Social Connections: Socially Isolated (07/25/2022)   Social Connection and Isolation Panel [NHANES]    Frequency of Communication with Friends and Family: More than three times a week    Frequency of Social Gatherings with Friends and Family: More than three times a week    Attends Religious Services: Never    Marine scientist or Organizations: No    Attends Archivist Meetings: Never    Marital Status: Never married  Intimate Partner Violence: Not At Risk (07/25/2022)   Humiliation, Afraid, Rape, and Kick questionnaire    Fear of Current or Ex-Partner: No    Emotionally Abused: No    Physically Abused: No    Sexually Abused: No   Butler Denmark, Laqueta Jean, DNP  [facility] [address], [place], [place] [address] [phone]

## 2022-07-31 ENCOUNTER — Encounter: Payer: Self-pay | Admitting: Neurology

## 2022-07-31 NOTE — Progress Notes (Signed)
Chart reviewed, agree above plan ?

## 2022-08-13 ENCOUNTER — Other Ambulatory Visit: Payer: Self-pay | Admitting: Neurology

## 2022-08-14 ENCOUNTER — Ambulatory Visit: Payer: Medicare Other | Admitting: Internal Medicine

## 2022-08-22 ENCOUNTER — Encounter: Payer: Self-pay | Admitting: Internal Medicine

## 2022-08-22 ENCOUNTER — Ambulatory Visit (INDEPENDENT_AMBULATORY_CARE_PROVIDER_SITE_OTHER): Payer: Medicare Other | Admitting: Internal Medicine

## 2022-08-22 DIAGNOSIS — M791 Myalgia, unspecified site: Secondary | ICD-10-CM | POA: Insufficient documentation

## 2022-08-22 DIAGNOSIS — E1165 Type 2 diabetes mellitus with hyperglycemia: Secondary | ICD-10-CM

## 2022-08-22 DIAGNOSIS — I700 Atherosclerosis of aorta: Secondary | ICD-10-CM | POA: Diagnosis not present

## 2022-08-22 DIAGNOSIS — E782 Mixed hyperlipidemia: Secondary | ICD-10-CM | POA: Diagnosis not present

## 2022-08-22 LAB — POCT GLYCOSYLATED HEMOGLOBIN (HGB A1C): Hemoglobin A1C: 6.7 % — AB (ref 4.0–5.6)

## 2022-08-22 MED ORDER — CYCLOBENZAPRINE HCL 10 MG PO TABS: 10.0000 mg | ORAL_TABLET | Freq: Every evening | ORAL | 0 refills | Status: DC | PRN

## 2022-08-22 MED ORDER — RELION LANCING DEVICE MISC: 1 refills | Status: AC

## 2022-08-22 MED ORDER — RELION BLOOD GLUCOSE TEST VI STRP: ORAL_STRIP | 1 refills | Status: DC

## 2022-08-22 MED ORDER — ATORVASTATIN CALCIUM 10 MG PO TABS: 10.0000 mg | ORAL_TABLET | Freq: Every day | ORAL | 1 refills | Status: DC

## 2022-08-22 NOTE — Assessment & Plan Note (Addendum)
POCT A1C 6.7% Urine microalbumin has been checked in the last year Encouraged him to consume a low carb diet Encouraged routine eye exam Encouraged routine foot exam  He declines immunizations

## 2022-08-22 NOTE — Progress Notes (Signed)
 Subjective:    Patient ID: Ralph Simpson, male    DOB: 08/30/1961, 61 y.o.   MRN: [id number]  HPI  Patient presents to clinic today for 3-month follow-up for HTN and HLD.  DM2: His last A1c was 6%, 04/2022.  He is not taking any oral diabetic medication at this time.  His sugars range.  His last eye exam was 12/2021.  He does not check his feet routinely.  Flu never.  Pneumovax never.  COVID never.  HLD with Aortic Atherosclerosis: His last LDL was 112, triglycerides 76, 04/2022.  He is taking Atorvastatin  but does have some myalgias.  He tries to consume low-fat diet.  Review of Systems     Past Medical History:  Diagnosis Date   Allergy    Avascular necrosis of bones of both hips (HCC)    GERD (gastroesophageal reflux disease)    Migraine    Neutropenia (HCC)    Osteoarthritis of both hips    Plantar fasciitis    Seizures (HCC)    x 2 03/2021   Trochanteric bursitis of right hip     Current Outpatient Medications  Medication Sig Dispense Refill   Blood Glucose Monitoring Suppl (ONETOUCH VERIO) w/Device KIT Used to check blood sugar once a day. DX E11.9 1 kit 0   glucose blood (ONETOUCH VERIO) test strip Check blood sugar using 1 strip daily for blood sugar monitoring related to DM 2 (E11.9) 100 strip 1   lamoTRIgine (LAMICTAL) 100 MG tablet TAKE 1 TABLET BY MOUTH TWICE A DAY 60 tablet 11   ondansetron (ZOFRAN-ODT) 4 MG disintegrating tablet Take by mouth.     OneTouch Delica Lancets 33G MISC Use to check blood sugar once a day.  DX: E11.9 100 each 1   pantoprazole (PROTONIX) 40 MG tablet Take by mouth.     Rimegepant Sulfate (NURTEC) 75 MG TBDP Take 1 tab at onset of migraine.  May repeat in 2 hrs, if needed.  Max dose: 2 tabs/day. This is a 30 day prescription. 12 tablet 11   No current facility-administered medications for this visit.    Allergies  Allergen Reactions   Aspirin     Causes brain bleed     Family History  Problem Relation Age of Onset   Arthritis  Mother    Stroke Mother    Diabetes Father    Heart disease Father    Hyperlipidemia Father    Hypertension Father    Kidney disease Maternal Grandfather    Colon cancer Neg Hx    Stomach cancer Neg Hx    Rectal cancer Neg Hx     Social History   Socioeconomic History   Marital status: Single    Spouse name: Not on file   Number of children: 0   Years of education: Not on file   Highest education level: 10th grade  Occupational History    Comment: NA  Tobacco Use   Smoking status: Former    Packs/day: 1.00    Types: Cigarettes    Passive exposure: Never   Smokeless tobacco: Former    Types: Chew   Tobacco comments:    quit 10-15 years ago  Vaping Use   Vaping Use: Never used  Substance and Sexual Activity   Alcohol use: Not Currently   Drug use: No   Sexual activity: Not Currently  Other Topics Concern   Not on file  Social History Narrative   Not on file   Social Determinants   of Health   Financial Resource Strain: Low Risk  (07/25/2022)   Overall Financial Resource Strain (CARDIA)    Difficulty of Paying Living Expenses: Not hard at all  Food Insecurity: No Food Insecurity (07/25/2022)   Hunger Vital Sign    Worried About Running Out of Food in the Last Year: Never true    Ran Out of Food in the Last Year: Never true  Transportation Needs: No Transportation Needs (07/25/2022)   PRAPARE - Transportation    Lack of Transportation (Medical): No    Lack of Transportation (Non-Medical): No  Physical Activity: Inactive (07/25/2022)   Exercise Vital Sign    Days of Exercise per Week: 0 days    Minutes of Exercise per Session: 0 min  Stress: No Stress Concern Present (07/25/2022)   [employer] - Occupational Stress Questionnaire    Feeling of Stress : Not at all  Social Connections: Socially Isolated (07/25/2022)   Social Connection and Isolation Panel [NHANES]    Frequency of Communication with Friends and Family: More than three times a week     Frequency of Social Gatherings with Friends and Family: More than three times a week    Attends Religious Services: Never    Active Member of Clubs or Organizations: No    Attends Club or Organization Meetings: Never    Marital Status: Never married  Intimate Partner Violence: Not At Risk (07/25/2022)   Humiliation, Afraid, Rape, and Kick questionnaire    Fear of Current or Ex-Partner: No    Emotionally Abused: No    Physically Abused: No    Sexually Abused: No     Constitutional: Patient reports intermittent headaches.  Denies fever, malaise, fatigue, or abrupt weight changes.  HEENT: Denies eye pain, eye redness, ear pain, ringing in the ears, wax buildup, runny nose, nasal congestion, bloody nose, or sore throat. Respiratory: Denies difficulty breathing, shortness of breath, cough or sputum production.   Cardiovascular: Denies chest pain, chest tightness, palpitations or swelling in the hands or feet.  Gastrointestinal: Denies abdominal pain, bloating, constipation, diarrhea or blood in the stool.  GU: Denies urgency, frequency, pain with urination, burning sensation, blood in urine, odor or discharge. Musculoskeletal: Patient reports bilateral hip pain.  Denies decrease in range of motion, difficulty with gait, muscle pain or joint swelling.  Skin: Denies redness, rashes, lesions or ulcercations.  Neurological: Denies dizziness, difficulty with memory, difficulty with speech or problems with balance and coordination.  Psych: Denies anxiety, depression, SI/HI.  No other specific complaints in a complete review of systems (except as listed in HPI above).  Objective:   Physical Exam  BP 123/64 (BP Location: Left Arm, Patient Position: Sitting, Cuff Size: Normal)   Pulse 74   Temp (!) 96.9 F (36.1 C) (Temporal)   Wt 189 lb (85.7 kg)   SpO2 99%   BMI 23.62 kg/m   Wt Readings from Last 3 Encounters:  07/30/22 193 lb (87.5 kg)  07/25/22 193 lb 12.8 oz (87.9 kg)  06/30/22 192  lb (87.1 kg)    General: Appears his stated age, well developed, well nourished in NAD. Skin: Warm, dry and intact.  HEENT: Head: normal shape and size; Eyes: sclera white, no icterus, conjunctiva pink, PERRLA and EOMs intact;  Cardiovascular: Normal rate and rhythm. S1,S2 noted.  No murmur, rubs or gallops noted. No JVD or BLE edema. No carotid bruits noted. Pulmonary/Chest: Normal effort and positive vesicular breath sounds. No respiratory distress. No wheezes, rales or ronchi   noted.  Neurological: Alert and oriented.   BMET    Component Value Date/Time   NA 140 06/12/2022 1001   NA 141 07/16/2021 0813   K 4.8 06/12/2022 1001   CL 105 06/12/2022 1001   CO2 27 06/12/2022 1001   GLUCOSE 183 (H) 06/12/2022 1001   BUN 20 06/12/2022 1001   BUN 20 07/16/2021 0813   CREATININE 0.79 06/12/2022 1001   CALCIUM 9.9 06/12/2022 1001   GFRNONAA >60 12/15/2021 1304   GFRAA >60 11/11/2015 1340    Lipid Panel     Component Value Date/Time   CHOL 175 06/12/2022 1001   TRIG 76 06/12/2022 1001   HDL 46 06/12/2022 1001   CHOLHDL 3.8 06/12/2022 1001   VLDL 16.6 03/05/2020 1003   LDLCALC 112 (H) 06/12/2022 1001    CBC    Component Value Date/Time   WBC 4.0 06/30/2022 1415   RBC 4.88 06/30/2022 1415   HGB 14.3 06/30/2022 1415   HGB 14.8 04/28/2022 1559   HCT 41.8 06/30/2022 1415   HCT 43.0 04/28/2022 1559   PLT 193 06/30/2022 1415   PLT 211 04/28/2022 1559   MCV 85.7 06/30/2022 1415   MCV 86 04/28/2022 1559   MCH 29.3 06/30/2022 1415   MCHC 34.2 06/30/2022 1415   RDW 13.0 06/30/2022 1415   RDW 12.6 04/28/2022 1559   LYMPHSABS 1.2 06/30/2022 1415   LYMPHSABS 1.1 04/28/2022 1559   MONOABS 0.4 06/30/2022 1415   EOSABS 0.1 06/30/2022 1415   EOSABS 0.1 04/28/2022 1559   BASOSABS 0.0 06/30/2022 1415   BASOSABS 0.0 04/28/2022 1559    Hgb A1C Lab Results  Component Value Date   HGBA1C 6.0 (H) 04/28/2022           Assessment & Plan:     RTC in 6 months, follow-up  chronic conditions Regina Baity, NP  

## 2022-08-22 NOTE — Assessment & Plan Note (Addendum)
Continue Atorvastatin Encouraged him to consume a low fat diet

## 2022-08-22 NOTE — Assessment & Plan Note (Addendum)
Encouraged low fat diet

## 2022-08-22 NOTE — Assessment & Plan Note (Addendum)
Encouraged him to consume a low fat diet Continue atorvastatin He can not take anticoagualtion due to previous bleed

## 2022-08-22 NOTE — Patient Instructions (Signed)

## 2022-09-15 ENCOUNTER — Ambulatory Visit: Payer: Medicare Other | Admitting: Internal Medicine

## 2022-10-21 ENCOUNTER — Telehealth: Payer: Self-pay | Admitting: Neurology

## 2022-10-21 DIAGNOSIS — R9089 Other abnormal findings on diagnostic imaging of central nervous system: Secondary | ICD-10-CM

## 2022-10-21 NOTE — Telephone Encounter (Signed)
Placed order in pod for Ralph Simpson's signature. I will fax to [facility] once I get it back.

## 2022-10-21 NOTE — Telephone Encounter (Signed)
Please call the patient, I will place orders for MRI of the brain with and without contrast for follow-up, seeing Dr. Krista Blue in Jan 2024.  Most recent MRI of the brain June 2023 showed stable cortical and leptomeningeal enhancement of the anterior left frontal lobe I am going to order MRI of the brain with and without contrast. He can come by for creatinine check before scan.   Orders Placed This Encounter  Procedures   MR BRAIN W WO CONTRAST   Creatinine, Serum

## 2022-10-21 NOTE — Telephone Encounter (Signed)
I attempted to call the pt, vm not working and he was not available.  Will attempt at a later time.

## 2022-10-21 NOTE — Telephone Encounter (Signed)
Pt called wanting to know if he can get this MRI done in [place]. Please advise.

## 2022-10-21 NOTE — Telephone Encounter (Signed)
MR brain w/wo sent to GI for scheduling.  Medicare A&[employer]/[place] Medicaid: [employer]

## 2022-10-24 NOTE — Telephone Encounter (Addendum)
I called pt and advised of results via vm( ok per dpr) and on lab request. Pt advised to report to lab during operating hours next week.   Phone room can relay message if pt calls back.

## 2022-10-27 NOTE — Telephone Encounter (Addendum)
Sent to [facility], they will schedule with [facility].

## 2022-11-05 ENCOUNTER — Other Ambulatory Visit: Payer: Medicare Other

## 2022-11-05 DIAGNOSIS — Z0289 Encounter for other administrative examinations: Secondary | ICD-10-CM

## 2022-11-05 DIAGNOSIS — R9089 Other abnormal findings on diagnostic imaging of central nervous system: Secondary | ICD-10-CM

## 2022-11-06 LAB — CREATININE, SERUM
Creatinine, Ser: 0.86 mg/dL (ref 0.76–1.27)
eGFR: 97 mL/min/{1.73_m2} (ref >59)

## 2022-11-12 ENCOUNTER — Telehealth: Payer: Self-pay | Admitting: Neurology

## 2022-11-12 NOTE — Telephone Encounter (Signed)
Called pt. Informed him that his labs was normal and it okay to proceed with MRI. He said thanks for call me back.

## 2022-11-12 NOTE — Telephone Encounter (Signed)
Please update pt that labs were normal, ok to proceed with MRI.

## 2022-11-12 NOTE — Telephone Encounter (Signed)
Pt called wanting to know if his lab results have come in so that he can get his MRI scheduled. Pt also wanted to make sure that the MRI order was sent to the [place] location. Please advise.

## 2022-11-15 ENCOUNTER — Emergency Department: Payer: Medicare Other

## 2022-11-15 ENCOUNTER — Other Ambulatory Visit: Payer: Self-pay

## 2022-11-15 ENCOUNTER — Emergency Department
Admission: EM | Admit: 2022-11-15 | Discharge: 2022-11-15 | Disposition: A | Discharge status: Home / Self Care | Payer: Medicare Other | Attending: Emergency Medicine | Admitting: Emergency Medicine

## 2022-11-15 DIAGNOSIS — G40109 Localization-related (focal) (partial) symptomatic epilepsy and epileptic syndromes with simple partial seizures, not intractable, without status epilepticus: Secondary | ICD-10-CM | POA: Insufficient documentation

## 2022-11-15 DIAGNOSIS — R569 Unspecified convulsions: Secondary | ICD-10-CM | POA: Diagnosis present

## 2022-11-15 LAB — COMPREHENSIVE METABOLIC PANEL
ALT: 20 U/L (ref 0–44)
AST: 17 U/L (ref 15–41)
Albumin: 4.7 g/dL (ref 3.5–5.0)
Alkaline Phosphatase: 63 U/L (ref 38–126)
Anion gap: 9 (ref 5–15)
BUN: 29 mg/dL — ABNORMAL HIGH (ref 8–23)
CO2: 27 mmol/L (ref 22–32)
Calcium: 9.4 mg/dL (ref 8.9–10.3)
Chloride: 105 mmol/L (ref 98–111)
Creatinine, Ser: 0.96 mg/dL (ref 0.61–1.24)
GFR, Estimated: >60 mL/min (ref >60)
Glucose, Bld: 110 mg/dL — ABNORMAL HIGH (ref 70–99)
Potassium: 4.3 mmol/L (ref 3.5–5.1)
Sodium: 141 mmol/L (ref 135–145)
Total Bilirubin: 0.5 mg/dL (ref 0.3–1.2)
Total Protein: 7.1 g/dL (ref 6.5–8.1)

## 2022-11-15 LAB — URINE DRUG SCREEN, QUALITATIVE (ARMC ONLY)
Amphetamines, Ur Screen: NOT DETECTED
Barbiturates, Ur Screen: NOT DETECTED
Benzodiazepine, Ur Scrn: NOT DETECTED
Cannabinoid 50 Ng, Ur ~~LOC~~: NOT DETECTED
Cocaine Metabolite,Ur ~~LOC~~: NOT DETECTED
MDMA (Ecstasy)Ur Screen: NOT DETECTED
Methadone Scn, Ur: NOT DETECTED
Opiate, Ur Screen: NOT DETECTED
Phencyclidine (PCP) Ur S: NOT DETECTED
Tricyclic, Ur Screen: NOT DETECTED

## 2022-11-15 LAB — DIFFERENTIAL
Abs Immature Granulocytes: 0.01 10*3/uL (ref 0.00–0.07)
Basophils Absolute: 0 10*3/uL (ref 0.0–0.1)
Basophils Relative: 1 %
Eosinophils Absolute: 0.1 10*3/uL (ref 0.0–0.5)
Eosinophils Relative: 2 %
Immature Granulocytes: 0 %
Lymphocytes Relative: 36 %
Lymphs Abs: 1.2 10*3/uL (ref 0.7–4.0)
Monocytes Absolute: 0.4 10*3/uL (ref 0.1–1.0)
Monocytes Relative: 11 %
Neutro Abs: 1.7 10*3/uL (ref 1.7–7.7)
Neutrophils Relative %: 50 %

## 2022-11-15 LAB — URINALYSIS, ROUTINE W REFLEX MICROSCOPIC
Bilirubin Urine: NEGATIVE
Glucose, UA: NEGATIVE mg/dL
Hgb urine dipstick: NEGATIVE
Ketones, ur: NEGATIVE mg/dL
Leukocytes,Ua: NEGATIVE
Nitrite: NEGATIVE
Protein, ur: NEGATIVE mg/dL
Specific Gravity, Urine: 1.027 (ref 1.005–1.030)
pH: 5 (ref 5.0–8.0)

## 2022-11-15 LAB — CBC
HCT: 43.7 % (ref 39.0–52.0)
Hemoglobin: 14.6 g/dL (ref 13.0–17.0)
MCH: 28.7 pg (ref 26.0–34.0)
MCHC: 33.4 g/dL (ref 30.0–36.0)
MCV: 86 fL (ref 80.0–100.0)
Platelets: 192 10*3/uL (ref 150–400)
RBC: 5.08 MIL/uL (ref 4.22–5.81)
RDW: 13.2 % (ref 11.5–15.5)
WBC: 3.5 10*3/uL — ABNORMAL LOW (ref 4.0–10.5)
nRBC: 0 % (ref 0.0–0.2)

## 2022-11-15 LAB — PROTIME-INR
INR: 1 (ref 0.8–1.2)
Prothrombin Time: 13.3 seconds (ref 11.4–15.2)

## 2022-11-15 LAB — APTT: aPTT: 31 seconds (ref 24–36)

## 2022-11-15 LAB — ETHANOL: Alcohol, Ethyl (B): <10 mg/dL (ref <10)

## 2022-11-15 MED ORDER — LEVETIRACETAM 500 MG PO TABS: 500.0000 mg | ORAL_TABLET | Freq: Two times a day (BID) | ORAL | 1 refills | Status: DC

## 2022-11-15 MED ORDER — LEVETIRACETAM 500 MG PO TABS
1500.0000 mg | ORAL_TABLET | Freq: Once | ORAL | Status: AC
  Administered 2022-11-15: 1500 mg via ORAL
  Filled 2022-11-15: qty 3

## 2022-11-15 NOTE — ED Triage Notes (Signed)
Pt here with a possible seizure. Pt states he had 2 seizures within the last 30 mins. Pt states when he feels them come on he gets numb on his right side when he feels them coming on. Pt was shaking in the ED lobby awaiting triage. Pt states he has not had a seizure in 9 months. Pt takes Lamictal.

## 2022-11-15 NOTE — ED Provider Notes (Signed)
[facility] Provider Note    Event Date/Time   First MD Initiated Contact with Patient 11/15/22 1521     (approximate)   History   Seizures  HPI  Ralph Simpson is a 61 y.o. 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  He was a passenger.  Suddenly his right arm started doing uncontrollable movements his head turned towards the side and his right arm and right leg started to feel numb.    Symptoms have since resolved.  He feels improved, reports he had the same symptoms 2 other times in the past 2 years and has been seen by specialist and has been diagnosed with some sort of scarring or otherwise on the left side of his brain.  He is supposed to have an MRI that is supposed to get scheduled soon as well that his neurologist has ordered  He takes 300 mg of Lamictal each night and reports that his how he has been currently directed to take it by his neurologist  In the past, when he took divalproex it caused him to "feel like a zombie"  Physical Exam   Triage Vital Signs: ED Triage Vitals  Enc Vitals Group     BP 11/15/22 1258 (!) 135/91     Pulse Rate 11/15/22 1258 72     Resp 11/15/22 1258 18     Temp 11/15/22 1258 97.9 F (36.6 C)     Temp Source 11/15/22 1258 Oral     SpO2 11/15/22 1258 96 %     Weight 11/15/22 1253 189 lb (85.7 kg)     Height 11/15/22 1253 '6\' 3"'$  (1.905 m)     Head Circumference --      Peak Flow --      Pain Score 11/15/22 1258 0     Pain Loc --      Pain Edu? --      Excl. in [place]? --     Most recent vital signs: Vitals:   11/15/22 1529 11/15/22 1530  BP:  120/70  Pulse: 75 76  Resp:    Temp:    SpO2: 100% 98%     General: Awake, no distress.  CV:  Good peripheral perfusion.  Tone Resp:  Normal effort.  Clear lungs, normal work of breathing Abd:  No distention.  Other:  Moves all extremities without  distress.  5 out of 5 strength in all extremities.  No motor or sensory deficits.  Speech is clear extraocular movements normal.  Facial movements normal.  No ongoing tremor or seizure-like activity.   ED Results / Procedures / Treatments   Labs (all labs ordered are listed, but only abnormal results are displayed) Labs Reviewed  CBC - Abnormal; Notable for the following components:      Result Value   WBC 3.5 (*)    All other components within normal limits  COMPREHENSIVE METABOLIC PANEL - Abnormal; Notable for the following components:   Glucose, Bld 110 (*)    BUN 29 (*)    All other components within normal limits  URINALYSIS, ROUTINE W REFLEX MICROSCOPIC - Abnormal; Notable for the following components:   Color, Urine YELLOW (*)    APPearance CLEAR (*)    All other components within normal limits  ETHANOL  PROTIME-INR  APTT  DIFFERENTIAL  URINE DRUG SCREEN, QUALITATIVE ([facility] ONLY)  LAMOTRIGINE LEVEL  EKG  And interpreted by me at 1300 heart rate 75 QRS 95 QTc 420 Normal sinus rhythm no evidence of acute ischemia   RADIOLOGY   Personally reviewed the patient's CT scan for gross abnormality.  No obvious tumor or mass effect, but there is some increased density noted in the left frontal region.  I also reviewed and Dr. Cheral Marker our neurologist also reviewed his CT scan  CT HEAD WO CONTRAST  Result Date: 11/15/2022 CLINICAL DATA:  Seizure disorder.  Clinical change. EXAM: CT HEAD WITHOUT CONTRAST TECHNIQUE: Contiguous axial images were obtained from the base of the skull through the vertex without intravenous contrast. RADIATION DOSE REDUCTION: This exam was performed according to the departmental dose-optimization program which includes automated exposure control, adjustment of the mA and/or kV according to patient size and/or use of iterative reconstruction technique. COMPARISON:  MR HEAD WITHOUT AND WITH CONTRAST 05/07/2022. CT HEAD WITHOUT CONTRAST 04/06/2021.  FINDINGS: Brain: Stable ill-defined hyperdensity is present in the high left frontal lobe. This corresponds to areas of leptomeningeal enhancement that is been seen on multiple MRIs. No acute infarct, hemorrhage, or mass lesion is present. Scattered white matter hypoattenuation is otherwise stable. Deep brain nuclei are within normal limits. The ventricles are of normal size. No significant extraaxial fluid collection is present. The brainstem and cerebellum are within normal limits. Vascular: No hyperdense vessel or unexpected calcification. Skull: Calvarium is intact. No focal lytic or blastic lesions are present. No significant extracranial soft tissue lesion is present. Sinuses/Orbits: The paranasal sinuses and mastoid air cells are clear. The globes and orbits are within normal limits. IMPRESSION: 1. Stable ill-defined hyperdensity in the high left frontal lobe. This corresponds to areas of leptomeningeal enhancement that is been seen on multiple MRIs. 2. No acute intracranial abnormality or significant interval change. 3. Stable mild white matter disease. This likely reflects the sequela of chronic microvascular ischemia. Electronically Signed   By: San Morelle M.D.   On: 11/15/2022 13:51      PROCEDURES:  Critical Care performed: No  Procedures   MEDICATIONS ORDERED IN ED: Medications  levETIRAcetam (KEPPRA) tablet 1,500 mg (has no administration in time range)     IMPRESSION / MDM / ASSESSMENT AND PLAN / ED COURSE  I reviewed the triage vital signs and the nursing notes.                              Differential diagnosis includes, but is not limited to, possible seizure, tremor, pseudo seizure though this seems highly unlikely, stroke though no evidence of ongoing deficit, mass lesion tumor etc.  No infectious symptoms.  His neurologic exam has returned to baseline.  Reassuring exam he reports similar episodes in the past due to what appeared to be partial seizures and I  suspect likely in relation to what ever the 6 enhancing lesion seen on previous MRI are though this appears to be stable today on his CT.  Discussed with her neurologist.  Will start him with a Keppra load as well as twice daily Keppra and have him follow-up closely with his outpatient neurologist.  Patient is understanding agreeable with this.  He is agreeable to not drive for at least a minimum of 6 months and until cleared by neurology.  Patient's presentation is most consistent with acute complicated illness / injury requiring diagnostic workup.  Leukopenia noted patient has a history of same.  Chemistry panel without acute abnormality  Labs reviewed  drug screen negative urinalysis negative      FINAL CLINICAL IMPRESSION(S) / ED DIAGNOSES   Final diagnoses:  Partial seizure ([place])     Rx / DC Orders   ED Discharge Orders          Ordered    levETIRAcetam (KEPPRA) 500 MG tablet  2 times daily        11/15/22 1602             Note:  This document was prepared using Dragon voice recognition software and may include unintentional dictation errors.   Delman Kitten, MD 11/15/22 [phone]

## 2022-11-15 NOTE — ED Triage Notes (Deleted)
Pt here with a possible seizure. Pt has had 2 seizures in the last 30 mins. Pt states he feels numb on his right side when he feels them coming on. Pt takes seizure medication daily. Pt was shaking in the ED lobby awaiting triage. Pt states his arm started shaking and he could not move his leg.

## 2022-11-15 NOTE — ED Provider Triage Note (Signed)
Emergency Medicine Provider Triage Evaluation Note  Ralph Simpson , a 61 y.o. male  was evaluated in triage.  Pt complains of right hand and leg shaking uncontrollably. He has had theses issues in the past and is on Lamictal. Had his level checked recently and no changes made to his dosage. No difference in events today than previous. No blurred vision, headache, or weakness now.  Physical Exam  BP (!) 135/91 (BP Location: Left Arm)   Pulse 72   Temp 97.9 F (36.6 C) (Oral)   Resp 18   Ht '6\' 3"'$  (1.905 m)   Wt 85.7 kg   SpO2 96%   BMI 23.62 kg/m  Gen:   Awake, no distress   Resp:  Normal effort  MSK:   Moves extremities without difficulty  Other:    Medical Decision Making  Medically screening exam initiated at 1:21 PM.  Appropriate orders placed.  Terrilee Files was informed that the remainder of the evaluation will be completed by another provider, this initial triage assessment does not replace that evaluation, and the importance of remaining in the ED until their evaluation is complete.   Victorino Dike, FNP 11/15/22 1323

## 2022-11-15 NOTE — Discharge Instructions (Addendum)
You have been seen in the emergency department today for a seizure.   Please follow up with your doctor/neurologist as soon as possible regarding today's emergency department visit and your likely seizure.  As we have discussed it is very important that you do not drive until you have been seen and cleared by your neurologist.  Please drink plenty of fluids, get plenty of sleep and avoid any alcohol or drug use  Please return to the emergency department if you have any further seizures which do not respond to medications, or for any other symptoms per se concerning for yourself.

## 2022-11-18 ENCOUNTER — Telehealth: Payer: Self-pay | Admitting: Neurology

## 2022-11-18 NOTE — Telephone Encounter (Signed)
Pt called to inform provider that he had 2 seizures Saturday and they added Keppra to his list of medications. He also wanted to inform provider that he is scheduled for an MRI on the 16th of this month. Please call back if any questions.

## 2022-11-18 NOTE — Telephone Encounter (Signed)
Please let patient know, he is on schedule for MRI of the brain for December 02, 2022, scheduled appointment with me January 11  If he is stable, has no recurrent seizure with higher dose of Keppra, will move his appointment after MRI of the brain,

## 2022-11-19 ENCOUNTER — Telehealth: Payer: Self-pay

## 2022-11-19 NOTE — Telephone Encounter (Signed)
Pt has been r/s  

## 2022-11-19 NOTE — Telephone Encounter (Signed)
Left Message per DPR. Rq CB. If patient calls back, per Dr. Krista Blue please ask about the seizure like-activity and if he has had them, leave the appointment for the 11th of January, but if no more please schedule after January 16th as he is having an MRI that day.  Thanks

## 2022-11-20 LAB — LAMOTRIGINE LEVEL: Lamotrigine Lvl: 6.9 ug/mL (ref 2.0–20.0)

## 2022-11-24 ENCOUNTER — Ambulatory Visit: Payer: Medicare Other | Admitting: Neurology

## 2022-11-27 ENCOUNTER — Ambulatory Visit: Payer: Medicare Other | Admitting: Neurology

## 2022-12-02 ENCOUNTER — Ambulatory Visit
Admission: RE | Admit: 2022-12-02 | Discharge: 2022-12-02 | Disposition: A | Discharge status: Home / Self Care | Payer: Medicare Other | Source: Ambulatory Visit | Attending: Neurology | Admitting: Neurology

## 2022-12-02 DIAGNOSIS — R9089 Other abnormal findings on diagnostic imaging of central nervous system: Secondary | ICD-10-CM | POA: Diagnosis not present

## 2022-12-02 MED ORDER — GADOBENATE DIMEGLUMINE 529 MG/ML IV SOLN
15.0000 mL | Freq: Once | INTRAVENOUS | Status: AC | PRN
  Administered 2022-12-02: 15 mL via INTRAVENOUS

## 2022-12-03 ENCOUNTER — Other Ambulatory Visit: Payer: Medicare Other

## 2022-12-23 ENCOUNTER — Ambulatory Visit: Payer: Medicare Other | Admitting: Neurology

## 2023-01-12 ENCOUNTER — Telehealth: Payer: Self-pay | Admitting: Neurology

## 2023-01-12 ENCOUNTER — Other Ambulatory Visit: Payer: Self-pay

## 2023-01-12 MED ORDER — LEVETIRACETAM 500 MG PO TABS: 500.0000 mg | ORAL_TABLET | Freq: Two times a day (BID) | ORAL | 1 refills | Status: DC

## 2023-01-12 NOTE — Telephone Encounter (Signed)
Pt is requesting a refill for levETIRAcetam (KEPPRA) 500 MG tablet .  Pharmacy: [facility] #[id number]

## 2023-01-12 NOTE — Telephone Encounter (Signed)
Medication sent, please tell pt

## 2023-01-14 ENCOUNTER — Ambulatory Visit: Payer: Medicare Other | Admitting: Neurology

## 2023-01-20 ENCOUNTER — Encounter: Payer: Self-pay | Admitting: Neurology

## 2023-01-20 ENCOUNTER — Ambulatory Visit (INDEPENDENT_AMBULATORY_CARE_PROVIDER_SITE_OTHER): Payer: Medicare Other | Admitting: Neurology

## 2023-01-20 VITALS — BP 115/74 | HR 69 | Ht 75.0 in | Wt 189.0 lb

## 2023-01-20 DIAGNOSIS — R9089 Other abnormal findings on diagnostic imaging of central nervous system: Secondary | ICD-10-CM

## 2023-01-20 DIAGNOSIS — G40209 Localization-related (focal) (partial) symptomatic epilepsy and epileptic syndromes with complex partial seizures, not intractable, without status epilepticus: Secondary | ICD-10-CM | POA: Diagnosis not present

## 2023-01-20 DIAGNOSIS — G43709 Chronic migraine without aura, not intractable, without status migrainosus: Secondary | ICD-10-CM | POA: Diagnosis not present

## 2023-01-20 MED ORDER — LEVETIRACETAM 500 MG PO TABS: 500.0000 mg | ORAL_TABLET | Freq: Two times a day (BID) | ORAL | 6 refills | Status: DC

## 2023-01-20 MED ORDER — LAMOTRIGINE 100 MG PO TABS: 200.0000 mg | ORAL_TABLET | Freq: Two times a day (BID) | ORAL | 11 refills | Status: DC

## 2023-01-20 NOTE — Progress Notes (Addendum)
ASSESSMENT AND PLAN 62 y.o. year old male  1.  Complex partial seizures   Recurrent seizure on November 15, 2022, while taking lamotrigine 300 mg daily, level was 6.9, Keppra 500 mg twice a day was added on, mild dizziness but overall tolerating it well  Will increase lamotrigine to 400 mg daily, may overlap with Keppra right now, if he is seizure-free for extended period of time, may consider lowering Keppra given tapering it off to avoid polypharmacy side effect 2.  Long history of chronic migraine headaches  His migraine is under good control 3.  Abnormal MRI of the brain  Repeat MRI of the brain with without contrast on December 04, 2022 showed stable enhancing lesions, multifocal, leptomeningeal mostly at the left frontal region, but slightly at the right frontal parietal and left occipital region,  We again had a prolonged discussion about leptomeningeal biopsy,or spinal fluid testing to figure out exact etiology of his left brain lesion, he is very hesitant to proceed Previous extensive laboratory evaluation was unrevealing Spinal fluid in May 2022 tube 4, protein slightly elevated 51, RBC 2225 pink color  DIAGNOSTIC DATA (LABS, IMAGING, TESTING) Ophthalmology evaluation by Dr. Dione Booze on March 05, 2023, diabetes, no ocular complications, nuclear sclerosis OD, cerebral infarction, visual field is reassuring,   MRI brain with and without contrast on December 02, 2022 -Stable multifocal leptomeningeal enhancement and associated T2 flair hyperintensity; mainly noted in left frontal region but also slightly in right frontoparietal and left occipital regions, stable from prior studies.  Considerations include chronic inflammatory, autoimmune, granulomatous disease process versus sequela from prior trauma or infection. -Mild chronic small vessel ischemic disease. -No acute findings.   Spinal fluid testing in May 2022, elevated RBC 2225, WBC 2, total protein 51  Extensive laboratory  evaluation 04/28/22: RPR, B12, CRP, TSH, CK, folate, sed rate, ANA, ferritin, CBC, Lyme, angiotensin-converting enzyme, ANCA, HIV, A1c showed mildly elevated A1c 6.0.  Depakote level was not detected, Lamictal was 5.7.  HISTORY  Ralph Simpson is a 62 year old male, seen in request by Dr. Ritta Slot to follow-up hospital discharge on Apr 09, 2021, he is accompanied by his brother at today's visit on Apr 12, 2021   I reviewed and summarized the referring note. PMHx. He has past medical history of bilateral hip avascular necrosis, status post bilateral fibular grafting, chronic low back pain, on disability   He had long history of chronic migraine headaches, his typical migraine are severe holoacranial pounding headache with light, noise sensitivity, it can happen 1-3 times a week, he used to take [facility] powder, now he take Advil, at least 2 doses of Excedrin Migraine with each episode of headache, he also has visual aura prior to his severe migraine headaches, he see tunnel vision, lose vision before headaches,   He reported frequent headaches over the past couple years, had his first right arm on controllable right arm jerking movement in September 2021, only last for few minutes, is associated with his severe headache,   He had similar episode in March 2022, he again presenting with severe migraine headaches, with uncontrollable right arm jerking, word finding difficulties, was brought in by his girlfriend to the emergency room, while in the waiting area, he had another episode   CT of the head showed left frontal subarachnoid hemorrhage   Which is confirmed by MRI of the brain with without contrast, area of subarachnoid/p.o. contrast enhancement over both convexities, left more than right, reactive enhancement in the setting of  subarachnoid hemorrhage is favored   He had a CT angiogram of head and neck was normal   Also four-vessel angiogram on Apr 10, 2021 showed no evidence of vascular  malformation   Extensive laboratory evaluations showed no significant abnormality, other than slight elevated scleroderma IgG antibody   Spinal fluid testing showed significantly elevated RBC 2225 at tube 4, slight elevated total protein of 51, with otherwise no significant abnormalities   He is discharge with Depakote DR 500 mg twice a day, overall tolerating medication, complains of some fatigue, he had long history of difficulty sleeping due to chronic hip pain, tends to take frequent naps in his chair instead of lying in bed sleep   UPDATE July 16 2021: He had recurrent partial seizure on July 6  and May 28, 2021, transient right arm shaking, lasting for few minutes, no dysarthria no confusion, he has some side effect with Depakote ER 500 mg 2 tablets every night, complains of dizziness, but still taking it,  Gait abnormality due to hip pain, pending upper and lower GI study   Extensive laboratory evaluation in May 2022 showed normal or negative ESR C-reactive protein ANA hypercoagulable panel, Lyme titer, low titer scleroderma antibody, angiotension enzyme, rheumatoid factor,  UPDATE April 28 2022: He is accompanied by his significant other [name] at today's visit, who has known him for more than 20 years, reported he did have word finding difficulty, mild short-term memory loss over the past few months, MoCA examination 23/30,  He was disabled as a Corporate investment banker due to bilateral hip avascular necrosis,  He has no recurrent seizure since last visit, taking lamotrigine 100 mg 3 tablets at nighttime, sometimes forgot taking medications, continue has intermittent headache, only has to take Bernita Raisin couple times over the last 4 months, worked well for him  He used to enjoy fishing, but now with his seizure spells, he become more sedentary, worried about recurrent spells, spent most of the time watching TV  He was seen by oncologist NP for leukopenia, thought due to epileptic  medications, Depakote has tapered off   We personally reviewed MRI of the brain with without contrast in February 2023, compared to previous MRI in May 2022, there was no significant change, stable chronic leptomeningeal enhancement, mainly in the left frontal region, but also slightly in the right frontal and bilateral parietal region, there is associated T2/FLAIR hyperintensity in the sulcal, juxtacortical, and periventricular regions, no SWI abnormality to suggest hemosiderin, above findings argue against the possibility of subarachnoid hemorrhage, despite previous spinal fluid testing in May 2022, showed pink spinal fluid, hazy, RBC was 2225, WBC was 2, could not rule out possibility of bloody tap, total protein was mildly elevated 51 at that time  EEG in May showed left cortical dysfunction, intermittent slow, maximal at the left temporal region, repeat EEG in June 2023 was normal  UPDATE March 5th 2024: He is accompanied by his significant other [name] at today's visit, who witnessed his most recent seizure on November 15, 2022, he was a passenger, complains of feeling hot, knowing he is going to have a spell, then began to have right arm twitching, passout, transient right-sided weakness when he was treated at [facility] Lamotrigine level was 6.9, taking 300 mg daily, normal CMP, CBC showed low WBC 3.5  Reviewed MRI of the brain with without contrast, stable multifocal leptomeningeal enhancement associated T2/FLAIR hyperintensity, mainly in the left frontal region, but slightly at the right frontal parietal left occipital region, stable  from previous study,  He is happy does not have much migraine headache, continue dealing with chronic hip pain, receiving injection periodically  PHYSICAL EXAM  Vitals:   04/28/22 1507  BP: 97/65  Pulse: 82  Weight: 192 lb (87.1 kg)  Height: 6\' 3"  (1.905 m)   Body mass index is 24 kg/m.   PHYSICAL EXAMNIATION:  Gen: NAD, conversant,  well nourised, well groomed                     Cardiovascular: Regular rate rhythm, no peripheral edema, warm, nontender. Eyes: Conjunctivae clear without exudates or hemorrhage Pulmonary: Clear to auscultation bilaterally   NEUROLOGICAL EXAM:  MENTAL STATUS: Speech/cognition: Awake, alert oriented to history taking and casual conversation    04/28/2022    3:00 PM  Montreal Cognitive Assessment   Visuospatial/ Executive (0/5) 4  Naming (0/3) 3  Attention: Read list of digits (0/2) 2  Attention: Read list of letters (0/1) 1  Attention: Serial 7 subtraction starting at 100 (0/3) 3  Language: Repeat phrase (0/2) 2  Language : Fluency (0/1) 0  Abstraction (0/2) 1  Delayed Recall (0/5) 1  Orientation (0/6) 6  Total 23     CRANIAL NERVES: CN II: Visual fields are full to confrontation.  Pupils are round equal and briskly reactive to light. CN III, IV, VI: extraocular movement are normal. No ptosis. CN V: Facial sensation is intact to pinprick in all 3 divisions bilaterally.  CN VII: Face is symmetric with normal eye closure and smile. CN VIII: Hearing is normal to casual conversation CN XI: Head turning and shoulder shrug are intact   MOTOR: No significant muscle weakness was noted  REFLEXES: Normal  SENSORY: Intact to light touch, no extinction  COORDINATION: Finger-nose-finger and heel-to-shin are normal  GAIT/STANCE: Has to push off from seated position to stand, gait is antalgic with the left hip, uses single-point cane   REVIEW OF SYSTEMS: Out of a complete 14 system review of symptoms, the patient complains only of the following symptoms, and all other reviewed systems are negative.  See HPI  ALLERGIES: Allergies  Allergen Reactions   Aspirin     Causes brain bleed     HOME MEDICATIONS: Outpatient Medications Prior to Visit  Medication Sig Dispense Refill   Blood Glucose Monitoring Suppl (ONETOUCH VERIO) w/Device KIT Used to check blood sugar once a day.  DX E11.9 1 kit 0   cyclobenzaprine (FLEXERIL) 10 MG tablet Take 1 tablet (10 mg total) by mouth at bedtime as needed for muscle spasms. 30 tablet 0   glucose blood (RELION GLUCOSE TEST STRIPS) test strip Use as instructed 100 each 1   lamoTRIgine (LAMICTAL) 100 MG tablet TAKE 1 TABLET BY MOUTH TWICE A DAY (Patient taking differently: Take 100 mg by mouth 3 (three) times daily.) 60 tablet 11   Lancet Devices (RELION LANCING DEVICE) MISC Use to check blood sugar one a day.  DX: E11.9 100 each 1   levETIRAcetam (KEPPRA) 500 MG tablet Take 1 tablet (500 mg total) by mouth 2 (two) times daily. 60 tablet 1   OneTouch Delica Lancets 33G MISC Use to check blood sugar once a day.  DX: E11.9 100 each 1   pantoprazole (PROTONIX) 40 MG tablet Take by mouth.     Rimegepant Sulfate (NURTEC) 75 MG TBDP Take 1 tab at onset of migraine.  May repeat in 2 hrs, if needed.  Max dose: 2 tabs/day. This is a 30 day prescription. 12  tablet 11   atorvastatin (LIPITOR) 10 MG tablet Take 1 tablet (10 mg total) by mouth daily. 90 tablet 1   ondansetron (ZOFRAN-ODT) 4 MG disintegrating tablet Take by mouth.     No facility-administered medications prior to visit.    PAST MEDICAL HISTORY: Past Medical History:  Diagnosis Date   Allergy    Avascular necrosis of bones of both hips (HCC)    GERD (gastroesophageal reflux disease)    Migraine    Neutropenia (HCC)    Osteoarthritis of both hips    Plantar fasciitis    Seizures (HCC)    x 2 03/2021   Trochanteric bursitis of right hip     PAST SURGICAL HISTORY: Past Surgical History:  Procedure Laterality Date   HERNIA REPAIR     HIP SURGERY     IR ANGIO INTRA EXTRACRAN SEL COM CAROTID INNOMINATE BILAT MOD SED  04/08/2021   IR ANGIO VERTEBRAL SEL SUBCLAVIAN INNOMINATE BILAT MOD SED  04/08/2021   IR US GUIDE VASC ACCESS RIGHT  04/08/2021    FAMILY HISTORY: Family History  Problem Relation Age of Onset   Arthritis Mother    Stroke Mother    Diabetes Father     Heart disease Father    Hyperlipidemia Father    Hypertension Father    Kidney disease Maternal Grandfather    Colon cancer Neg Hx    Stomach cancer Neg Hx    Rectal cancer Neg Hx     SOCIAL HISTORY: Social History   Socioeconomic History   Marital status: Single    Spouse name: Not on file   Number of children: 0   Years of education: Not on file   Highest education level: 10th grade  Occupational History    Comment: NA  Tobacco Use   Smoking status: Former    Packs/day: 1.00    Types: Cigarettes    Passive exposure: Never   Smokeless tobacco: Former    Types: Chew   Tobacco comments:    quit 10-15 years ago  Vaping Use   Vaping Use: Never used  Substance and Sexual Activity   Alcohol use: Not Currently   Drug use: No   Sexual activity: Not Currently  Other Topics Concern   Not on file  Social History Narrative   Not on file   Social Determinants of Health   Financial Resource Strain: Low Risk  (07/25/2022)   Overall Financial Resource Strain (CARDIA)    Difficulty of Paying Living Expenses: Not hard at all  Food Insecurity: No Food Insecurity (07/25/2022)   Hunger Vital Sign    Worried About Running Out of Food in the Last Year: Never true    Ran Out of Food in the Last Year: Never true  Transportation Needs: No Transportation Needs (07/25/2022)   PRAPARE - Administrator, Civil Service (Medical): No    Lack of Transportation (Non-Medical): No  Physical Activity: Inactive (07/25/2022)   Exercise Vital Sign    Days of Exercise per Week: 0 days    Minutes of Exercise per Session: 0 min  Stress: No Stress Concern Present (07/25/2022)   [employer] of Occupational Health - Occupational Stress Questionnaire    Feeling of Stress : Not at all  Social Connections: Socially Isolated (07/25/2022)   Social Connection and Isolation Panel [NHANES]    Frequency of Communication with Friends and Family: More than three times a week    Frequency of Social  Gatherings with Friends and Family:  More than three times a week    Attends Religious Services: Never    Active Member of Clubs or Organizations: No    Attends Banker Meetings: Never    Marital Status: Never married  Intimate Partner Violence: Not At Risk (07/25/2022)   Humiliation, Afraid, Rape, and Kick questionnaire    Fear of Current or Ex-Partner: No    Emotionally Abused: No    Physically Abused: No    Sexually Abused: No    Levert Feinstein, M.D. Ph.D.  [facility] [address] [place], [place] [address] Phone: [phone] Fax:      [phone]

## 2023-02-10 ENCOUNTER — Inpatient Hospital Stay (HOSPITAL_COMMUNITY): Payer: Medicare Other | Admitting: Certified Registered Nurse Anesthetist

## 2023-02-10 ENCOUNTER — Inpatient Hospital Stay (HOSPITAL_COMMUNITY): Payer: Medicare Other

## 2023-02-10 ENCOUNTER — Emergency Department (HOSPITAL_COMMUNITY): Payer: Medicare Other

## 2023-02-10 ENCOUNTER — Inpatient Hospital Stay (HOSPITAL_COMMUNITY)
Admission: EM | Admit: 2023-02-10 | Discharge: 2023-02-12 | DRG: 024 | Disposition: A | Discharge status: Home Health | Payer: Medicare Other | Attending: Student in an Organized Health Care Education/Training Program | Admitting: Student in an Organized Health Care Education/Training Program

## 2023-02-10 ENCOUNTER — Encounter (HOSPITAL_COMMUNITY): Payer: Self-pay | Admitting: Certified Registered Nurse Anesthetist

## 2023-02-10 ENCOUNTER — Encounter (HOSPITAL_COMMUNITY)
Admission: EM | Disposition: A | Discharge status: Home Health | Payer: Self-pay | Source: Home / Self Care | Attending: Neurology

## 2023-02-10 DIAGNOSIS — M160 Bilateral primary osteoarthritis of hip: Secondary | ICD-10-CM | POA: Diagnosis present

## 2023-02-10 DIAGNOSIS — I63531 Cerebral infarction due to unspecified occlusion or stenosis of right posterior cerebral artery: Secondary | ICD-10-CM | POA: Diagnosis not present

## 2023-02-10 DIAGNOSIS — Z87891 Personal history of nicotine dependence: Secondary | ICD-10-CM | POA: Diagnosis not present

## 2023-02-10 DIAGNOSIS — Z823 Family history of stroke: Secondary | ICD-10-CM | POA: Diagnosis not present

## 2023-02-10 DIAGNOSIS — I69354 Hemiplegia and hemiparesis following cerebral infarction affecting left non-dominant side: Secondary | ICD-10-CM

## 2023-02-10 DIAGNOSIS — I6621 Occlusion and stenosis of right posterior cerebral artery: Secondary | ICD-10-CM | POA: Diagnosis present

## 2023-02-10 DIAGNOSIS — G43109 Migraine with aura, not intractable, without status migrainosus: Secondary | ICD-10-CM | POA: Diagnosis present

## 2023-02-10 DIAGNOSIS — I639 Cerebral infarction, unspecified: Secondary | ICD-10-CM | POA: Diagnosis present

## 2023-02-10 DIAGNOSIS — M1990 Unspecified osteoarthritis, unspecified site: Secondary | ICD-10-CM

## 2023-02-10 DIAGNOSIS — Z79899 Other long term (current) drug therapy: Secondary | ICD-10-CM

## 2023-02-10 DIAGNOSIS — Z8349 Family history of other endocrine, nutritional and metabolic diseases: Secondary | ICD-10-CM | POA: Diagnosis not present

## 2023-02-10 DIAGNOSIS — I6389 Other cerebral infarction: Secondary | ICD-10-CM | POA: Diagnosis not present

## 2023-02-10 DIAGNOSIS — G8194 Hemiplegia, unspecified affecting left nondominant side: Secondary | ICD-10-CM | POA: Diagnosis present

## 2023-02-10 DIAGNOSIS — I10 Essential (primary) hypertension: Secondary | ICD-10-CM | POA: Diagnosis present

## 2023-02-10 DIAGNOSIS — Z833 Family history of diabetes mellitus: Secondary | ICD-10-CM | POA: Diagnosis not present

## 2023-02-10 DIAGNOSIS — E119 Type 2 diabetes mellitus without complications: Secondary | ICD-10-CM

## 2023-02-10 DIAGNOSIS — Z8261 Family history of arthritis: Secondary | ICD-10-CM | POA: Diagnosis not present

## 2023-02-10 DIAGNOSIS — Z8249 Family history of ischemic heart disease and other diseases of the circulatory system: Secondary | ICD-10-CM | POA: Diagnosis not present

## 2023-02-10 DIAGNOSIS — E1165 Type 2 diabetes mellitus with hyperglycemia: Secondary | ICD-10-CM | POA: Diagnosis present

## 2023-02-10 DIAGNOSIS — G40209 Localization-related (focal) (partial) symptomatic epilepsy and epileptic syndromes with complex partial seizures, not intractable, without status epilepticus: Secondary | ICD-10-CM | POA: Diagnosis present

## 2023-02-10 DIAGNOSIS — R29705 NIHSS score 5: Secondary | ICD-10-CM | POA: Diagnosis present

## 2023-02-10 DIAGNOSIS — K219 Gastro-esophageal reflux disease without esophagitis: Secondary | ICD-10-CM | POA: Diagnosis present

## 2023-02-10 DIAGNOSIS — Z1152 Encounter for screening for COVID-19: Secondary | ICD-10-CM | POA: Diagnosis not present

## 2023-02-10 DIAGNOSIS — I63431 Cerebral infarction due to embolism of right posterior cerebral artery: Principal | ICD-10-CM | POA: Diagnosis present

## 2023-02-10 DIAGNOSIS — I63432 Cerebral infarction due to embolism of left posterior cerebral artery: Secondary | ICD-10-CM | POA: Diagnosis present

## 2023-02-10 DIAGNOSIS — E785 Hyperlipidemia, unspecified: Secondary | ICD-10-CM | POA: Diagnosis present

## 2023-02-10 DIAGNOSIS — Z886 Allergy status to analgesic agent status: Secondary | ICD-10-CM | POA: Diagnosis not present

## 2023-02-10 HISTORY — PX: IR ANGIO VERTEBRAL SEL VERTEBRAL UNI R MOD SED: IMG5368

## 2023-02-10 HISTORY — PX: RADIOLOGY WITH ANESTHESIA: SHX6223

## 2023-02-10 HISTORY — PX: IR PERCUTANEOUS ART THROMBECTOMY/INFUSION INTRACRANIAL INC DIAG ANGIO: IMG6087

## 2023-02-10 LAB — DIFFERENTIAL
Abs Immature Granulocytes: 0.01 10*3/uL (ref 0.00–0.07)
Basophils Absolute: 0 10*3/uL (ref 0.0–0.1)
Basophils Relative: 1 %
Eosinophils Absolute: 0.1 10*3/uL (ref 0.0–0.5)
Eosinophils Relative: 1 %
Immature Granulocytes: 0 %
Lymphocytes Relative: 20 %
Lymphs Abs: 0.8 10*3/uL (ref 0.7–4.0)
Monocytes Absolute: 0.4 10*3/uL (ref 0.1–1.0)
Monocytes Relative: 9 %
Neutro Abs: 2.9 10*3/uL (ref 1.7–7.7)
Neutrophils Relative %: 69 %

## 2023-02-10 LAB — I-STAT CHEM 8, ED
BUN: 23 mg/dL (ref 8–23)
Calcium, Ion: 1.06 mmol/L — ABNORMAL LOW (ref 1.15–1.40)
Chloride: 104 mmol/L (ref 98–111)
Creatinine, Ser: 0.8 mg/dL (ref 0.61–1.24)
Glucose, Bld: 138 mg/dL — ABNORMAL HIGH (ref 70–99)
HCT: 43 % (ref 39.0–52.0)
Hemoglobin: 14.6 g/dL (ref 13.0–17.0)
Potassium: 3.9 mmol/L (ref 3.5–5.1)
Sodium: 139 mmol/L (ref 135–145)
TCO2: 22 mmol/L (ref 22–32)

## 2023-02-10 LAB — RAPID URINE DRUG SCREEN, HOSP PERFORMED
Amphetamines: NOT DETECTED
Barbiturates: NOT DETECTED
Benzodiazepines: NOT DETECTED
Cocaine: NOT DETECTED
Opiates: NOT DETECTED
Tetrahydrocannabinol: NOT DETECTED

## 2023-02-10 LAB — URINALYSIS, COMPLETE (UACMP) WITH MICROSCOPIC
Bilirubin Urine: NEGATIVE
Glucose, UA: NEGATIVE mg/dL
Ketones, ur: 20 mg/dL — AB
Leukocytes,Ua: NEGATIVE
Nitrite: NEGATIVE
Protein, ur: NEGATIVE mg/dL
Specific Gravity, Urine: >1.046 — ABNORMAL HIGH (ref 1.005–1.030)
pH: 5 (ref 5.0–8.0)

## 2023-02-10 LAB — CBC
HCT: 45.1 % (ref 39.0–52.0)
Hemoglobin: 15.2 g/dL (ref 13.0–17.0)
MCH: 28.9 pg (ref 26.0–34.0)
MCHC: 33.7 g/dL (ref 30.0–36.0)
MCV: 85.7 fL (ref 80.0–100.0)
Platelets: 177 10*3/uL (ref 150–400)
RBC: 5.26 MIL/uL (ref 4.22–5.81)
RDW: 13.1 % (ref 11.5–15.5)
WBC: 4.2 10*3/uL (ref 4.0–10.5)
nRBC: 0 % (ref 0.0–0.2)

## 2023-02-10 LAB — MRSA NEXT GEN BY PCR, NASAL: MRSA by PCR Next Gen: DETECTED — AB

## 2023-02-10 LAB — COMPREHENSIVE METABOLIC PANEL
ALT: 21 U/L (ref 0–44)
AST: 16 U/L (ref 15–41)
Albumin: 4.3 g/dL (ref 3.5–5.0)
Alkaline Phosphatase: 58 U/L (ref 38–126)
Anion gap: 11 (ref 5–15)
BUN: 21 mg/dL (ref 8–23)
CO2: 23 mmol/L (ref 22–32)
Calcium: 9.3 mg/dL (ref 8.9–10.3)
Chloride: 103 mmol/L (ref 98–111)
Creatinine, Ser: 0.85 mg/dL (ref 0.61–1.24)
GFR, Estimated: >60 mL/min (ref >60)
Glucose, Bld: 139 mg/dL — ABNORMAL HIGH (ref 70–99)
Potassium: 3.9 mmol/L (ref 3.5–5.1)
Sodium: 137 mmol/L (ref 135–145)
Total Bilirubin: 0.6 mg/dL (ref 0.3–1.2)
Total Protein: 6.6 g/dL (ref 6.5–8.1)

## 2023-02-10 LAB — PROTIME-INR
INR: 1.1 (ref 0.8–1.2)
Prothrombin Time: 13.9 seconds (ref 11.4–15.2)

## 2023-02-10 LAB — ETHANOL: Alcohol, Ethyl (B): <10 mg/dL (ref <10)

## 2023-02-10 LAB — APTT: aPTT: 31 seconds (ref 24–36)

## 2023-02-10 LAB — CBG MONITORING, ED: Glucose-Capillary: 147 mg/dL — ABNORMAL HIGH (ref 70–99)

## 2023-02-10 LAB — SARS CORONAVIRUS 2 BY RT PCR: SARS Coronavirus 2 by RT PCR: NEGATIVE

## 2023-02-10 SURGERY — IR WITH ANESTHESIA
Anesthesia: General

## 2023-02-10 MED ORDER — SUCCINYLCHOLINE CHLORIDE 200 MG/10ML IV SOSY
PREFILLED_SYRINGE | INTRAVENOUS | Status: DC | PRN
  Administered 2023-02-10: 140 mg via INTRAVENOUS

## 2023-02-10 MED ORDER — NITROGLYCERIN 1 MG/10 ML FOR IR/CATH LAB
INTRA_ARTERIAL | Status: AC
  Filled 2023-02-10: qty 10

## 2023-02-10 MED ORDER — CLEVIDIPINE BUTYRATE 0.5 MG/ML IV EMUL
INTRAVENOUS | Status: DC | PRN
  Administered 2023-02-10: 2 mg/h via INTRAVENOUS

## 2023-02-10 MED ORDER — LIDOCAINE 2% (20 MG/ML) 5 ML SYRINGE
INTRAMUSCULAR | Status: DC | PRN
  Administered 2023-02-10: 60 mg via INTRAVENOUS

## 2023-02-10 MED ORDER — ONDANSETRON HCL 4 MG/2ML IJ SOLN
4.0000 mg | Freq: Four times a day (QID) | INTRAMUSCULAR | Status: DC | PRN
  Administered 2023-02-10: 4 mg via INTRAVENOUS
  Filled 2023-02-10: qty 2

## 2023-02-10 MED ORDER — SUGAMMADEX SODIUM 200 MG/2ML IV SOLN
INTRAVENOUS | Status: DC | PRN
  Administered 2023-02-10: 200 mg via INTRAVENOUS

## 2023-02-10 MED ORDER — ONDANSETRON HCL 4 MG/2ML IJ SOLN
INTRAMUSCULAR | Status: DC | PRN
  Administered 2023-02-10: 4 mg via INTRAVENOUS

## 2023-02-10 MED ORDER — ACETAMINOPHEN 160 MG/5ML PO SOLN: 650.0000 mg | ORAL | Status: DC | PRN

## 2023-02-10 MED ORDER — PHENYLEPHRINE 80 MCG/ML (10ML) SYRINGE FOR IV PUSH (FOR BLOOD PRESSURE SUPPORT)
PREFILLED_SYRINGE | INTRAVENOUS | Status: DC | PRN
  Administered 2023-02-10 (×2): 80 ug via INTRAVENOUS

## 2023-02-10 MED ORDER — PROPOFOL 10 MG/ML IV BOLUS
INTRAVENOUS | Status: DC | PRN
  Administered 2023-02-10: 150 mg via INTRAVENOUS
  Administered 2023-02-10: 20 mg via INTRAVENOUS

## 2023-02-10 MED ORDER — EPHEDRINE SULFATE-NACL 50-0.9 MG/10ML-% IV SOSY
PREFILLED_SYRINGE | INTRAVENOUS | Status: DC | PRN
  Administered 2023-02-10: 5 mg via INTRAVENOUS

## 2023-02-10 MED ORDER — LEVETIRACETAM 500 MG PO TABS
500.0000 mg | ORAL_TABLET | Freq: Two times a day (BID) | ORAL | Status: DC
  Administered 2023-02-10 – 2023-02-12 (×4): 500 mg via ORAL
  Filled 2023-02-10 (×4): qty 1

## 2023-02-10 MED ORDER — IOHEXOL 300 MG/ML  SOLN
150.0000 mL | Freq: Once | INTRAMUSCULAR | Status: AC | PRN
  Administered 2023-02-10: 75 mL via INTRA_ARTERIAL

## 2023-02-10 MED ORDER — CHLORHEXIDINE GLUCONATE CLOTH 2 % EX PADS
6.0000 | MEDICATED_PAD | Freq: Every day | CUTANEOUS | Status: DC
  Administered 2023-02-10 – 2023-02-11 (×2): 6 via TOPICAL

## 2023-02-10 MED ORDER — CHLORHEXIDINE GLUCONATE CLOTH 2 % EX PADS: 6.0000 | MEDICATED_PAD | Freq: Every day | CUTANEOUS | Status: DC

## 2023-02-10 MED ORDER — MUPIROCIN 2 % EX OINT
1.0000 | TOPICAL_OINTMENT | Freq: Two times a day (BID) | CUTANEOUS | Status: DC
  Administered 2023-02-10 – 2023-02-11 (×3): 1 via NASAL
  Filled 2023-02-10: qty 22

## 2023-02-10 MED ORDER — ORAL CARE MOUTH RINSE: 15.0000 mL | OROMUCOSAL | Status: DC | PRN

## 2023-02-10 MED ORDER — SODIUM CHLORIDE 0.9 % IV SOLN: INTRAVENOUS | Status: DC

## 2023-02-10 MED ORDER — ONDANSETRON HCL 4 MG/2ML IJ SOLN: 4.0000 mg | Freq: Once | INTRAMUSCULAR | Status: AC

## 2023-02-10 MED ORDER — NITROGLYCERIN 1 MG/10 ML FOR IR/CATH LAB
INTRA_ARTERIAL | Status: AC | PRN
  Administered 2023-02-10: 25 ug via INTRA_ARTERIAL

## 2023-02-10 MED ORDER — CLEVIDIPINE BUTYRATE 0.5 MG/ML IV EMUL: 0.0000 mg/h | INTRAVENOUS | Status: DC

## 2023-02-10 MED ORDER — ALUM & MAG HYDROXIDE-SIMETH 200-200-20 MG/5ML PO SUSP
30.0000 mL | ORAL | Status: DC | PRN
  Administered 2023-02-10: 30 mL via ORAL
  Filled 2023-02-10: qty 30

## 2023-02-10 MED ORDER — SODIUM CHLORIDE 0.9 % IV SOLN: INTRAVENOUS | Status: DC | PRN

## 2023-02-10 MED ORDER — ACETAMINOPHEN 650 MG RE SUPP: 650.0000 mg | RECTAL | Status: DC | PRN

## 2023-02-10 MED ORDER — PHENYLEPHRINE HCL-NACL 20-0.9 MG/250ML-% IV SOLN
INTRAVENOUS | Status: DC | PRN
  Administered 2023-02-10: 10 ug/min via INTRAVENOUS

## 2023-02-10 MED ORDER — ACETAMINOPHEN 325 MG PO TABS
650.0000 mg | ORAL_TABLET | ORAL | Status: DC | PRN
  Filled 2023-02-10: qty 2

## 2023-02-10 MED ORDER — IOHEXOL 350 MG/ML SOLN
75.0000 mL | Freq: Once | INTRAVENOUS | Status: AC | PRN
  Administered 2023-02-10: 75 mL via INTRAVENOUS

## 2023-02-10 MED ORDER — LAMOTRIGINE 100 MG PO TABS
200.0000 mg | ORAL_TABLET | Freq: Two times a day (BID) | ORAL | Status: DC
  Administered 2023-02-10 – 2023-02-12 (×4): 200 mg via ORAL
  Filled 2023-02-10 (×4): qty 2

## 2023-02-10 MED ORDER — CEFAZOLIN SODIUM-DEXTROSE 2-3 GM-%(50ML) IV SOLR
INTRAVENOUS | Status: DC | PRN
  Administered 2023-02-10: 2 g via INTRAVENOUS

## 2023-02-10 MED ORDER — STROKE: EARLY STAGES OF RECOVERY BOOK
Freq: Once | Status: AC
  Filled 2023-02-10: qty 1

## 2023-02-10 MED ORDER — IOHEXOL 300 MG/ML  SOLN
150.0000 mL | Freq: Once | INTRAMUSCULAR | Status: AC | PRN
  Administered 2023-02-10: 15 mL via INTRA_ARTERIAL

## 2023-02-10 MED ORDER — DEXAMETHASONE SODIUM PHOSPHATE 10 MG/ML IJ SOLN
INTRAMUSCULAR | Status: DC | PRN
  Administered 2023-02-10: 5 mg via INTRAVENOUS

## 2023-02-10 MED ORDER — SENNOSIDES-DOCUSATE SODIUM 8.6-50 MG PO TABS: 1.0000 | ORAL_TABLET | Freq: Every evening | ORAL | Status: DC | PRN

## 2023-02-10 MED ORDER — ROCURONIUM BROMIDE 10 MG/ML (PF) SYRINGE
PREFILLED_SYRINGE | INTRAVENOUS | Status: DC | PRN
  Administered 2023-02-10: 50 mg via INTRAVENOUS
  Administered 2023-02-10: 20 mg via INTRAVENOUS

## 2023-02-10 MED ORDER — ONDANSETRON HCL 4 MG/2ML IJ SOLN
INTRAMUSCULAR | Status: AC
  Administered 2023-02-10: 4 mg via INTRAVENOUS
  Filled 2023-02-10: qty 2

## 2023-02-10 MED ORDER — CEFAZOLIN SODIUM-DEXTROSE 2-4 GM/100ML-% IV SOLN
INTRAVENOUS | Status: AC
  Filled 2023-02-10: qty 100

## 2023-02-10 MED ORDER — CLEVIDIPINE BUTYRATE 0.5 MG/ML IV EMUL
INTRAVENOUS | Status: AC
  Filled 2023-02-10: qty 50

## 2023-02-10 MED ORDER — SODIUM CHLORIDE 0.9% FLUSH: 3.0000 mL | Freq: Once | INTRAVENOUS | Status: DC

## 2023-02-10 MED ORDER — ACETAMINOPHEN 325 MG PO TABS
650.0000 mg | ORAL_TABLET | ORAL | Status: DC | PRN
  Administered 2023-02-10 – 2023-02-12 (×3): 650 mg via ORAL
  Filled 2023-02-10 (×2): qty 2

## 2023-02-10 NOTE — ED Triage Notes (Signed)
Pt BIBA from home after acute onset of headache at 1100 with left leg and arm weakness, numbness and tingling. Pt has hx of complex migraines but this feels different. Code Stroke activated. Patient A&Ox4, 6/10 headache pain, GCS 15 throughout transport. VSS, NAD noted.

## 2023-02-10 NOTE — Anesthesia Procedure Notes (Signed)
Procedure Name: Intubation Date/Time: 02/10/2023 2:29 PM  Performed by: Dorthea Cove, CRNAPre-anesthesia Checklist: Patient identified, Emergency Drugs available, Suction available and Patient being monitored Patient Re-evaluated:Patient Re-evaluated prior to induction Oxygen Delivery Method: Circle system utilized Preoxygenation: Pre-oxygenation with 100% oxygen Induction Type: IV induction, Cricoid Pressure applied and Rapid sequence Laryngoscope Size: Mac and 4 Grade View: Grade II Tube type: Oral Tube size: 7.5 mm Number of attempts: 1 Airway Equipment and Method: Stylet and Oral airway Placement Confirmation: ETT inserted through vocal cords under direct vision, positive ETCO2 and breath sounds checked- equal and bilateral Secured at: 23 cm Tube secured with: Tape Dental Injury: Teeth and Oropharynx as per pre-operative assessment

## 2023-02-10 NOTE — ED Provider Notes (Signed)
[facility] Provider Note   CSN: [id number] Arrival date & time: 02/10/23  1344     History  Chief Complaint  Patient presents with   Code Stroke    Ralph Simpson is a 62 y.o. male.  11 am last normal, left side weakness and numbness and headache. Hx of migraines, seizures. Code stroke called by EMS. Not on blood thinners. No seizures per patient. No other symptoms.  The history is provided by the patient and the EMS personnel.  Neurologic Problem This is a new problem. The current episode started 1 to 2 hours ago. The problem occurs constantly. The problem has not changed since onset.Associated symptoms include headaches. Pertinent negatives include no chest pain, no abdominal pain and no shortness of breath. Nothing aggravates the symptoms. Nothing relieves the symptoms. He has tried nothing for the symptoms. The treatment provided no relief.       Home Medications Prior to Admission medications   Medication Sig Start Date End Date Taking? Authorizing Provider  Blood Glucose Monitoring Suppl (ONETOUCH VERIO) w/Device KIT Used to check blood sugar once a day. DX E11.9 05/01/22   Jearld Fenton, NP  cyclobenzaprine (FLEXERIL) 10 MG tablet Take 1 tablet (10 mg total) by mouth at bedtime as needed for muscle spasms. 08/22/22   Jearld Fenton, NP  glucose blood (RELION GLUCOSE TEST STRIPS) test strip Use as instructed 08/22/22   Jearld Fenton, NP  lamoTRIgine (LAMICTAL) 100 MG tablet Take 2 tablets (200 mg total) by mouth 2 (two) times daily. 01/20/23   Marcial Pacas, MD  Lancet Devices (RELION LANCING DEVICE) MISC Use to check blood sugar one a day.  DX: E11.9 08/22/22   Jearld Fenton, NP  levETIRAcetam (KEPPRA) 500 MG tablet Take 1 tablet (500 mg total) by mouth 2 (two) times daily. 01/20/23 08/18/23  Marcial Pacas, MD  OneTouch Delica Lancets 99991111 MISC Use to check blood sugar once a day.  DX: E11.9 05/01/22   Jearld Fenton, NP  pantoprazole  (PROTONIX) 40 MG tablet Take by mouth. 07/29/21   [provider]  Rimegepant Sulfate (NURTEC) 75 MG TBDP Take 1 tab at onset of migraine.  May repeat in 2 hrs, if needed.  Max dose: 2 tabs/day. This is a 30 day prescription. 07/16/21   Marcial Pacas, MD      Allergies    Aspirin    Review of Systems   Review of Systems  Respiratory:  Negative for shortness of breath.   Cardiovascular:  Negative for chest pain.  Gastrointestinal:  Negative for abdominal pain.  Neurological:  Positive for headaches.    Physical Exam Updated Vital Signs BP 112/81   Pulse 79   Resp 16   Wt 84.8 kg   SpO2 96%   BMI 23.37 kg/m  Physical Exam Vitals and nursing note reviewed.  Constitutional:      General: He is not in acute distress.    Appearance: He is well-developed. He is not ill-appearing.  HENT:     Head: Normocephalic and atraumatic.     Nose: Nose normal.  Eyes:     Extraocular Movements: Extraocular movements intact.     Conjunctiva/sclera: Conjunctivae normal.     Pupils: Pupils are equal, round, and reactive to light.  Cardiovascular:     Rate and Rhythm: Normal rate and regular rhythm.     Pulses: Normal pulses.     Heart sounds: Normal heart sounds. No murmur  heard. Pulmonary:     Effort: Pulmonary effort is normal. No respiratory distress.     Breath sounds: Normal breath sounds.  Abdominal:     Palpations: Abdomen is soft.     Tenderness: There is no abdominal tenderness.  Musculoskeletal:        General: No swelling.     Cervical back: Neck supple.  Skin:    General: Skin is warm and dry.     Capillary Refill: Capillary refill takes less than 2 seconds.  Neurological:     Mental Status: He is alert.     Comments: 1/5 LLE strength, 4+/5 LUE strength, decreased left side sensation, normal strength and sensation otherwise, normal speech, normal visual fields.   Psychiatric:        Mood and Affect: Mood normal.     ED Results / Procedures / Treatments    Labs (all labs ordered are listed, but only abnormal results are displayed) Labs Reviewed  COMPREHENSIVE METABOLIC PANEL - Abnormal; Notable for the following components:      Result Value   Glucose, Bld 139 (*)    All other components within normal limits  I-STAT CHEM 8, ED - Abnormal; Notable for the following components:   Glucose, Bld 138 (*)    Calcium, Ion 1.06 (*)    All other components within normal limits  CBG MONITORING, ED - Abnormal; Notable for the following components:   Glucose-Capillary 147 (*)    All other components within normal limits  SARS CORONAVIRUS 2 BY RT PCR  PROTIME-INR  APTT  CBC  DIFFERENTIAL  ETHANOL  RAPID URINE DRUG SCREEN, HOSP PERFORMED  URINALYSIS, COMPLETE (UACMP) WITH MICROSCOPIC    EKG None  Radiology CT ANGIO HEAD NECK W WO CM (CODE STROKE)  Result Date: 02/10/2023 CLINICAL DATA:  Left-sided weakness and numbness EXAM: CT ANGIOGRAPHY HEAD AND NECK TECHNIQUE: Multidetector CT imaging of the head and neck was performed using the standard protocol during bolus administration of intravenous contrast. Multiplanar CT image reconstructions and MIPs were obtained to evaluate the vascular anatomy. Carotid stenosis measurements (when applicable) are obtained utilizing NASCET criteria, using the distal internal carotid diameter as the denominator. RADIATION DOSE REDUCTION: This exam was performed according to the departmental dose-optimization program which includes automated exposure control, adjustment of the mA and/or kV according to patient size and/or use of iterative reconstruction technique. CONTRAST:  26mL OMNIPAQUE IOHEXOL 350 MG/ML SOLN COMPARISON:  CTA 04/06/2021, correlation is also made with CT head 02/10/2023 and MRI head 12/02/2022 FINDINGS: CT HEAD FINDINGS For noncontrast findings, please see same day CT head. CTA NECK FINDINGS Aortic arch: Standard branching. Imaged portion shows no evidence of aneurysm or dissection. No significant  stenosis of the major arch vessel origins. Right carotid system: No evidence of stenosis, dissection, or occlusion. Left carotid system: No evidence of stenosis, dissection, or occlusion. Vertebral arteries: No evidence of stenosis, dissection, or occlusion. Skeleton: No acute osseous abnormality. Degenerative changes in the cervical spine. Other neck: Negative. Upper chest: No focal pulmonary opacity or pleural effusion. Review of the MIP images confirms the above findings CTA HEAD FINDINGS Anterior circulation: Both internal carotid arteries are patent to the termini, without significant stenosis. A1 segments patent. Normal anterior communicating artery. Anterior cerebral arteries are patent to their distal aspects without significant stenosis. No M1 stenosis or occlusion. MCA branches perfused to their distal aspects without significant stenosis. Posterior circulation: Vertebral arteries patent to the vertebrobasilar junction without significant stenosis. Posterior inferior cerebellar arteries patent proximally.  Basilar patent to its distal aspect without significant stenosis. Superior cerebellar arteries patent proximally. Patent P1 segments. Occlusion of the right P2 (series 7, image 124), with likely distal reconstitution of a P3 or P4 segment (series 7, image 122). The left PCA is perfused to its distal aspect without significant stenosis. The left posterior communicating artery is patent. The right posterior communicating artery is not definitively seen but may be diminutive. Venous sinuses: As permitted by contrast timing, patent. Anatomic variants: None significant. Review of the MIP images confirms the above findings On postcontrast imaging, there is enhancement in left greater than right sulci, no seen on the prior MRI. IMPRESSION: 1. Occlusion of the right P2, with likely distal reconstitution of a P3 or P4 segment. 2. No other intracranial large vessel occlusion or significant stenosis. 3. No  hemodynamically significant stenosis in the neck. 4. Enhancement in left greater than right sulci, as seen on the prior MRI. Attention on subsequent MRI. These findings were discussed by telephone on 02/10/2023 at 2:08 pm with provider ASHISH ARORA . Electronically Signed   By: Merilyn Baba M.D.   On: 02/10/2023 14:23   CT HEAD CODE STROKE WO CONTRAST  Result Date: 02/10/2023 CLINICAL DATA:  Code stroke.  Left-sided weakness and numbness EXAM: CT HEAD WITHOUT CONTRAST TECHNIQUE: Contiguous axial images were obtained from the base of the skull through the vertex without intravenous contrast. RADIATION DOSE REDUCTION: This exam was performed according to the departmental dose-optimization program which includes automated exposure control, adjustment of the mA and/or kV according to patient size and/or use of iterative reconstruction technique. COMPARISON:  11/15/2022 CT head, 12/02/2022 MRI head FINDINGS: Brain: No evidence of acute infarction, hemorrhage, mass, mass effect, or midline shift. No hydrocephalus or extra-axial collection. Redemonstrated ill-defined hyperdensity in the high left frontal lobe, as well as in the right parietal white lobe, which correlates with the areas of cortical an leptomeningeal enhancement seen on the 12/02/2022 MRI. Vascular: No hyperdense vessel. Skull: Negative for fracture or focal lesion. Sinuses/Orbits: No acute finding. Status post left lens replacement. Other: The mastoid air cells are well aerated. ASPECTS [facility] Stroke Program Early CT Score) - Ganglionic level infarction (caudate, lentiform nuclei, internal capsule, insula, M1-M3 cortex): 7 - Supraganglionic infarction (M4-M6 cortex): 3 Total score (0-10 with 10 being normal): 10 IMPRESSION: 1. No acute intracranial process. ASPECTS is 10. 2. Redemonstrated ill-defined hyperdensity in the high left frontal lobe and right parietal lobe, which correlates with the area of cortical an leptomeningeal enhancement seen on the  prior MRIs. These findings were discussed by telephone on 02/10/2023 at 2:05 pm with provider ARORA . Electronically Signed   By: Merilyn Baba M.D.   On: 02/10/2023 14:07    Procedures Procedures    Medications Ordered in ED Medications  sodium chloride flush (NS) 0.9 % injection 3 mL (has no administration in time range)   stroke: early stages of recovery book (has no administration in time range)  0.9 %  sodium chloride infusion (has no administration in time range)  acetaminophen (TYLENOL) tablet 650 mg (has no administration in time range)    Or  acetaminophen (TYLENOL) 160 MG/5ML solution 650 mg (has no administration in time range)    Or  acetaminophen (TYLENOL) suppository 650 mg (has no administration in time range)  senna-docusate (Senokot-S) tablet 1 tablet (has no administration in time range)  ceFAZolin (ANCEF) 2-4 GM/100ML-% IVPB (has no administration in time range)  nitroGLYCERIN 100 mcg/mL intra-arterial injection (has no administration in  time range)  ondansetron (ZOFRAN) injection 4 mg (4 mg Intravenous Given 02/10/23 1425)  iohexol (OMNIPAQUE) 350 MG/ML injection 75 mL (75 mLs Intravenous Contrast Given 02/10/23 1402)    ED Course/ Medical Decision Making/ A&P                             Medical Decision Making Amount and/or Complexity of Data Reviewed Labs: ordered. Radiology: ordered.  Risk Decision regarding hospitalization.   Terrilee Files is here with strokelike symptoms.  History of seizures and complex migraines.  Patient arrives with normal vitals.  No fever.  Last known normal on 1130.  Developed left lower leg weakness and arm weakness and left-sided numbness.  Had a mild headache.  But did not feel like his normal complex migraines.  Code stroke activated in the field.  He arrives with left-sided weakness and left-sided numbness.  Otherwise his exam is unremarkable.  He went directly to CT with neurology with Dr. Malen Gauze.  We both the chart reviewed and  seems like he has had complex imaging and workup with neurology in the past.  He has had this leptomeningeal enhancement that was seen on imaging a couple years ago.  He had lumbar puncture, angiograms and big workup without understanding quite what this area was.  He has refused repeat LPs and biopsy of this area in the past.  This could be an inflammatory process.  Ultimately he had a CT head that showed no head bleed however CT angiogram shows occlusion of the right P2.  Ultimately neurology decided against TNK as they think he is little bit higher risk for bleeding then others given this enhancement that he had on his imaging in the past.  Ultimately decision was made to take him for IR thrombectomy.  Lab work for my review and interpretation unremarkable.  Patient went to IR.  Hemodynamically stable.  Will be admitted to the neurological ICU.  This chart was dictated using voice recognition software.  Despite best efforts to proofread,  errors can occur which can change the documentation meaning.         Final Clinical Impression(s) / ED Diagnoses Final diagnoses:  Cerebrovascular accident (CVA), unspecified mechanism [facility])    Rx / DC Orders ED Discharge Orders     None         Lennice Sites, DO 02/10/23 1440

## 2023-02-10 NOTE — Anesthesia Postprocedure Evaluation (Signed)
Anesthesia Post Note  Patient: Ralph Simpson  Procedure(s) Performed: IR WITH ANESTHESIA     Patient location during evaluation: ICU Anesthesia Type: General Level of consciousness: awake and alert Pain management: pain level controlled Vital Signs Assessment: post-procedure vital signs reviewed and stable Respiratory status: spontaneous breathing, nonlabored ventilation, respiratory function stable and patient connected to nasal cannula oxygen Cardiovascular status: blood pressure returned to baseline and stable Postop Assessment: no apparent nausea or vomiting Anesthetic complications: no   No notable events documented.  Last Vitals:  Vitals:   02/10/23 1645 02/10/23 1700  BP: 118/77 129/79  Pulse: 85 85  Resp: 13 14  Temp:    SpO2: 97% 97%    Last Pain:  Vitals:   02/10/23 1615  TempSrc: Oral  PainSc: [place]

## 2023-02-10 NOTE — Transfer of Care (Signed)
Immediate Anesthesia Transfer of Care Note  Patient: Ralph Simpson  Procedure(s) Performed: IR WITH ANESTHESIA  Patient Location: ICU  Anesthesia Type:General  Level of Consciousness: awake, drowsy, and patient cooperative  Airway & Oxygen Therapy: Patient Spontanous Breathing and Patient connected to face mask oxygen  Post-op Assessment: Report given to RN and Post -op Vital signs reviewed and stable  Post vital signs: Reviewed and stable  Last Vitals: SEE ICU VITALS FOR ABP Vitals Value Taken Time  BP    Temp    Pulse 89 02/10/23 1622  Resp 13 02/10/23 1622  SpO2 97 % 02/10/23 1622  Vitals shown include unvalidated device data.  Last Pain:  Vitals:   02/10/23 1424  PainSc: 6          Complications: No notable events documented.

## 2023-02-10 NOTE — Code Documentation (Signed)
Stroke Response Nurse Documentation Code Documentation  Ralph Simpson is a 62 y.o. 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 On No antithrombotic. Code stroke was activated by EMS.   Patient from home where he was LKW at 1100 and now complaining of complete left sided weakness/tingling . Per EMS, patient had a sudden onset of a headache at 1100 with complete left sided numbness/tingling outside of the symptoms associated with his migraines.   Stroke team at the bedside on patient arrival. Labs drawn and patient cleared for CT by Dr. Ronnald Nian. Patient to CT with team. NIHSS 5, see documentation for details and code stroke times. Patient with left arm weakness, left leg weakness, and left decreased sensation on exam. The following imaging was completed:  CT Head and CTA. Patient is not a candidate for IV Thrombolytic due to history meningeal enhancements. Patient is not a candidate for IR due to R P2 PCA Occlusion.   Care Plan: Patient transferred to IR.   Bedside handoff with IR RN [place].    Ralene Cork  Stroke Response RN

## 2023-02-10 NOTE — Procedures (Signed)
INR.  Status post left vertebral artery angiogram.  Right CFA approach.  Findings.  1. Occluded right posterior cerebral artery distal P1/P2 junction.  Status post complete revascularization of occluded right posterior cerebral artery distal P1 P2 junction with 1 pass with contact aspiration using an 054 zoom aspiration catheter withTICI 3 revascularization.  CT brain demonstrates no evidence of intracranial hemorrhage.  Cortical hyperattenuation noted in the left  parietal region representing contrast staining.  8 French Angio-Seal closure device deployed at the right groin puncture site for hemostasis.  Distal pulses all intact bilaterally compared to prior to the  procedure.  Patient was extubated.  Moving all 4s equally. Following simple commands.  Pupils 96mm RT = LT sluggishly reactive.  Arlean Hopping MD.

## 2023-02-10 NOTE — Anesthesia Preprocedure Evaluation (Addendum)
Anesthesia Evaluation  Patient identified by MRN, date of birth, ID band Patient awake    Reviewed: Allergy & Precautions, NPO status , Patient's Chart, lab work & pertinent test results, Unable to perform ROS - Chart review onlyPreop documentation limited or incomplete due to emergent nature of procedure.  History of Anesthesia Complications Negative for: history of anesthetic complications  Airway Mallampati: III  TM Distance: >3 FB Neck ROM: Full    Dental  (+) Dental Advisory Given, Missing   Pulmonary former smoker   Pulmonary exam normal        Cardiovascular Normal cardiovascular exam     Neuro/Psych  Headaches, Seizures -,  CVA, Residual Symptoms    GI/Hepatic ,GERD  Medicated and Controlled,,  Endo/Other  diabetes    Renal/GU      Musculoskeletal  (+) Arthritis ,    Abdominal   Peds  Hematology   Anesthesia Other Findings   Reproductive/Obstetrics                             Anesthesia Physical Anesthesia Plan  ASA: 4 and emergent  Anesthesia Plan: General   Post-op Pain Management:    Induction: Intravenous and Rapid sequence  PONV Risk Score and Plan: 2 and Treatment may vary due to age or medical condition and Ondansetron  Airway Management Planned: Oral ETT  Additional Equipment: Arterial line  Intra-op Plan:   Post-operative Plan: Possible Post-op intubation/ventilation  Informed Consent:      Only emergency history available and History available from chart only  Plan Discussed with: CRNA and Anesthesiologist  Anesthesia Plan Comments:         Anesthesia Quick Evaluation

## 2023-02-10 NOTE — H&P (Addendum)
Neurology H&P  Reason for Consult: Code Stroke Referring Physician: Dr. Ronnald Nian  CC: Left lower extremity weakness  History is obtained from: Patient, EMS, chart review  HPI: Ralph Simpson is a 62 y.o. male with a medical history significant for complex migraine headaches, neutropenia, complex partial seizures on lamotrigine and Keppra, abnormal brain MRI with multifocal enhancing lesions, mostly leptomeningeal and largely at the left frontal region but slightly at the right frontoparietal and left occipital regions who presented to the ED via EMS on 02/10/2023 for evaluation of acute onset of headache with left arm weakness, numbness, tingling and left leg weakness and numbness.  Patient states that at the onset of his headache at 11:00 AM, his headache was consistent with his typical migraine headaches, however the patient did not have any vision changes prior to onset or during headache which is different than his usual headaches and the patient never has experienced left lower extremity weakness with headaches as he did today.  Per EMS, patient had to crawl to the door to let them in.  Per chart review, Dr. Krista Blue with [facility] has spoken to the patient at length about his leptomeningeal enhancement and further testing including biopsy and further CSF studies to which the patient has been very reluctant. On arrival, the patient complains of ongoing right periorbital headache rated 6/10 in severity with associated nausea.    LKW: 11:00 AM TNK given?: no, due to patient's history of leptomeningeal enhancement of unclear etiology, the risks of IV thrombolytics are felt to outweigh the benefits at this time.   IR Thrombectomy? Yes, CT angio head and neck reviewed by attending neurologist, neurointerventional radiologist, and the neuroradiologist with findings consistent with an acute right P2 PCA occlusion.  Modified Rankin Scale: 0-Completely asymptomatic and back to baseline post- stroke  ROS: A complete  ROS was performed and is negative except as noted in the HPI.   Past Medical History:  Diagnosis Date   Allergy    Avascular necrosis of bones of both hips (HCC)    GERD (gastroesophageal reflux disease)    Migraine    Neutropenia (HCC)    Osteoarthritis of both hips    Plantar fasciitis    Seizures (HCC)    x 2 03/2021   Trochanteric bursitis of right hip    Past Surgical History:  Procedure Laterality Date   HERNIA REPAIR     HIP SURGERY     IR ANGIO INTRA EXTRACRAN SEL COM CAROTID INNOMINATE BILAT MOD SED  04/08/2021   IR ANGIO VERTEBRAL SEL SUBCLAVIAN INNOMINATE BILAT MOD SED  04/08/2021   IR US GUIDE VASC ACCESS RIGHT  04/08/2021   Family History  Problem Relation Age of Onset   Arthritis Mother    Stroke Mother    Diabetes Father    Heart disease Father    Hyperlipidemia Father    Hypertension Father    Kidney disease Maternal Grandfather    Colon cancer Neg Hx    Stomach cancer Neg Hx    Rectal cancer Neg Hx    Social History:   reports that he has quit smoking. His smoking use included cigarettes. He smoked an average of 1 pack per day. He has never been exposed to tobacco smoke. He has quit using smokeless tobacco.  His smokeless tobacco use included chew. He reports that he does not currently use alcohol. He reports that he does not use drugs.  Medications  Current Facility-Administered Medications:    [START ON 02/11/2023]  stroke: early stages of recovery book, , Does not apply, Once, Toberman, Stevi W, NP   0.9 %  sodium chloride infusion, , Intravenous, Continuous, Toberman, Stevi W, NP   acetaminophen (TYLENOL) tablet 650 mg, 650 mg, Oral, Q4H PRN **OR** acetaminophen (TYLENOL) 160 MG/5ML solution 650 mg, 650 mg, Per Tube, Q4H PRN **OR** acetaminophen (TYLENOL) suppository 650 mg, 650 mg, Rectal, Q4H PRN, Ebbie Latus, Roney Mans, NP   ondansetron (ZOFRAN) 4 MG/2ML injection, , , ,    ondansetron (ZOFRAN) injection 4 mg, 4 mg, Intravenous, Once, Amie Portland, MD    senna-docusate (Senokot-S) tablet 1 tablet, 1 tablet, Oral, QHS PRN, Ebbie Latus, Stevi W, NP   sodium chloride flush (NS) 0.9 % injection 3 mL, 3 mL, Intravenous, Once, Curatolo, Adam, DO  Current Outpatient Medications:    Blood Glucose Monitoring Suppl (ONETOUCH VERIO) w/Device KIT, Used to check blood sugar once a day. DX E11.9, Disp: 1 kit, Rfl: 0   cyclobenzaprine (FLEXERIL) 10 MG tablet, Take 1 tablet (10 mg total) by mouth at bedtime as needed for muscle spasms., Disp: 30 tablet, Rfl: 0   glucose blood (RELION GLUCOSE TEST STRIPS) test strip, Use as instructed, Disp: 100 each, Rfl: 1   lamoTRIgine (LAMICTAL) 100 MG tablet, Take 2 tablets (200 mg total) by mouth 2 (two) times daily., Disp: 120 tablet, Rfl: 11   Lancet Devices (RELION LANCING DEVICE) MISC, Use to check blood sugar one a day.  DX: E11.9, Disp: 100 each, Rfl: 1   levETIRAcetam (KEPPRA) 500 MG tablet, Take 1 tablet (500 mg total) by mouth 2 (two) times daily., Disp: 60 tablet, Rfl: 6   OneTouch Delica Lancets 99991111 MISC, Use to check blood sugar once a day.  DX: E11.9, Disp: 100 each, Rfl: 1   pantoprazole (PROTONIX) 40 MG tablet, Take by mouth., Disp: , Rfl:    Rimegepant Sulfate (NURTEC) 75 MG TBDP, Take 1 tab at onset of migraine.  May repeat in 2 hrs, if needed.  Max dose: 2 tabs/day. This is a 30 day prescription., Disp: 12 tablet, Rfl: 11  Exam: Current vital signs: BP 112/81   Pulse 79   Resp 16   Wt 84.8 kg   SpO2 96%   BMI 23.37 kg/m  Vital signs in last 24 hours: SpO2:  [96 %] 96 % (03/26 1422) Weight:  [84.8 kg] 84.8 kg (03/26 1300)  GENERAL: Awake, alert, in no acute distress Psych: Affect appropriate for situation, patient is calm and cooperative with examination Head: Normocephalic and atraumatic, without obvious abnormality EENT: Normal conjunctivae, dry mucous membranes, no OP obstruction LUNGS: Normal respiratory effort. Non-labored breathing on room air CV: Regular rate and rhythm on  telemetry ABDOMEN: Soft, non-tender, non-distended Extremities: Warm, well perfused, without obvious deformity  NEURO:  Mental Status: Awake, alert, and oriented to person, place, time, and situation. He is able to provide a clear and coherent history of present illness. Speech/Language: speech is intact without dysarthria or aphasia. No neglect is noted Cranial Nerves:  II: PERRL. Visual fields full.  III, IV, VI: EOMI without ptosis V: Sensation is intact to light touch and symmetrical to face.  VII: Face is symmetric resting and smiling. VIII: Hearing intact to voice IX, X: Palate elevation is symmetric. Phonation normal.  XI: Normal sternocleidomastoid and trapezius muscle strength XII: Tongue protrudes midline without fasciculations.   Motor: 5/5 strength present in the right upper and lower extremity Left upper extremity with some weakness noted with minimal vertical drift. Left lower extremity with significant  weakness and minimal movement without gravity. Tone is normal. Bulk is normal.  Sensation: Patient endorses numbness and tingling to the left arm and numbness to the left leg Coordination: No overt ataxia noted.  Unable to perform on the left lower extremity due to significant weakness. Gait: Deferred for patient safety  NIHSS: 1a Level of Conscious.: 0 1b LOC Questions: 0 1c LOC Commands: 0 2 Best Gaze: 0 3 Visual: 0 4 Facial Palsy: 0 5a Motor Arm - left: 1 5b Motor Arm - Right: 0 6a Motor Leg - Left: 3 6b Motor Leg - Right: 0 7 Limb Ataxia: 0 8 Sensory: 1 9 Best Language: 0 10 Dysarthria: 0 11 Extinct. and Inatten.: 0 TOTAL: 5  Labs I have reviewed labs in epic and the results pertinent to this consultation are: CBC    Component Value Date/Time   WBC 4.2 02/10/2023 1346   RBC 5.26 02/10/2023 1346   HGB 14.6 02/10/2023 1352   HGB 14.8 04/28/2022 1559   HCT 43.0 02/10/2023 1352   HCT 43.0 04/28/2022 1559   PLT 177 02/10/2023 1346   PLT 211  04/28/2022 1559   MCV 85.7 02/10/2023 1346   MCV 86 04/28/2022 1559   MCH 28.9 02/10/2023 1346   MCHC 33.7 02/10/2023 1346   RDW 13.1 02/10/2023 1346   RDW 12.6 04/28/2022 1559   LYMPHSABS 0.8 02/10/2023 1346   LYMPHSABS 1.1 04/28/2022 1559   MONOABS 0.4 02/10/2023 1346   EOSABS 0.1 02/10/2023 1346   EOSABS 0.1 04/28/2022 1559   BASOSABS 0.0 02/10/2023 1346   BASOSABS 0.0 04/28/2022 1559   CMP     Component Value Date/Time   NA 139 02/10/2023 1352   NA 141 07/16/2021 0813   K 3.9 02/10/2023 1352   CL 104 02/10/2023 1352   CO2 27 11/15/2022 1300   GLUCOSE 138 (H) 02/10/2023 1352   BUN 23 02/10/2023 1352   BUN 20 07/16/2021 0813   CREATININE 0.80 02/10/2023 1352   CREATININE 0.79 06/12/2022 1001   CALCIUM 9.4 11/15/2022 1300   PROT 7.1 11/15/2022 1300   PROT 7.1 07/16/2021 0813   ALBUMIN 4.7 11/15/2022 1300   ALBUMIN 4.7 07/16/2021 0813   AST 17 11/15/2022 1300   ALT 20 11/15/2022 1300   ALKPHOS 63 11/15/2022 1300   BILITOT 0.5 11/15/2022 1300   BILITOT 0.3 07/16/2021 0813   GFRNONAA >60 11/15/2022 1300   GFRAA >60 11/11/2015 1340   Lipid Panel     Component Value Date/Time   CHOL 175 06/12/2022 1001   TRIG 76 06/12/2022 1001   HDL 46 06/12/2022 1001   CHOLHDL 3.8 06/12/2022 1001   VLDL 16.6 03/05/2020 1003   LDLCALC 112 (H) 06/12/2022 1001   Lab Results  Component Value Date   HGBA1C 6.7 (A) 08/22/2022   Imaging I have reviewed the images obtained:  CT head 02/10/23: 1. No acute intracranial process. ASPECTS is 10. 2. Redemonstrated ill-defined hyperdensity in the high left frontal lobe and right parietal lobe, which correlates with the area of cortical an leptomeningeal enhancement seen on the prior MRIs.  CT angio head and neck 02/10/23: 1. Occlusion of the right P2, with likely distal reconstitution of a P3 or P4 segment. 2. No other intracranial large vessel occlusion or significant stenosis. 3. No hemodynamically significant stenosis in the neck. 4.  Enhancement in left greater than right sulci, as seen on the prior MRI. Attention on subsequent MRI.  Assessment: 62 year old male with PMHx of complex migraine headaches, neutropenia, complex  partial seizures on home lamotrigine and Keppra, and nonspecific multifocal enhancing lesions of the brain, mostly leptomeningeal and largely at the left frontal region but slightly at the right frontoparietal and left occipital regions who presented to the ED via EMS for evaluation of acute onset of headache with associated left upper and lower extremity numbness, tingling, and weakness.  Patient's typical migraine headaches include left upper extremity weakness, numbness, and tingling though his normal complex migraine headaches do not include left lower extremity weakness.  Imaging was obtained concerning for a new left P2 occlusion and patient was taken to IR.  Patient was not offered TNKase as the risks of thrombolytic therapy were felt to outweigh the benefits with unclear etiology of known leptomeningeal enhancement, which could potentially bleed easy .   Plan: Acute Ischemic Stroke Cerebral infarction due to embolism of left posterior cerebral artery   Acuity: Acute Current Suspected Etiology: stroke work up pending for etiology  Continue Evaluation:  -Admit to: ICU -Prophylaxis per IR following procedure -Blood pressure control, goal per IR -MRI/ECHO/A1C/Lipid panel. -Hyperglycemia management per SSI to maintain glucose 140-180mg /dL. -PT/OT/ST therapies and recommendations when able  CNS Acute ischemic stroke -Close neuro monitoring -STAT CT head with any neurologic decline and notify neurology  Hemiplegia and hemiparesis following cerebral infarction affecting left non-dominant side  -PT/OT  History of complex partial seizures -Continue home AEDs lamotrigine and Keppra -Seizure precautions  RESP Intubation for IR procedure -vent management per ICU as needed following IR -wean when  able -maintain SpO2 > 92% -supplemental oxygen as needed  CV Hyperlipidemia, unspecified  - Statin for goal LDL < 70  Echocardiogram pending  HEME AM CBC -monitor H&H -Transfuse for hgb < 7  ENDO A1c pending -goal HgbA1c < 7% -SSI as needed  GI/GU -Gentle hydration while NPO  Fluid/Electrolyte Disorders AM CMP  -Replete, repeat labs as needed, trend  ID Monitor fever curve and WBC -UA pending  Prophylaxis DVT:  SCDs GI: PPI Bowel: Docusate / Senna  Diet: NPO until passes bedside swallow  Code Status: Full Code   THE FOLLOWING WERE PRESENT ON ADMISSION: CNS -  Acute Ischemic Stroke, Hemiplegia, nonspecific leptomeningeal enhancement, complex partial seizure history, history of migraine headaches  Pt seen by NP/Neuro and later by MD. Note/plan to be edited by MD as needed.  Anibal Henderson, AGAC-NP [facility] Pager: [phone]   Attending Neurohospitalist Addendum Patient seen and examined with APP/Resident. Agree with the history and physical as documented above. Agree with the plan as documented, which I helped formulate. I have independently reviewed the chart, obtained history, review of systems and examined the patient.I have personally reviewed pertinent head/neck/spine imaging (CT/MRI).   Briefly, patient with a known history of leptomeningeal enhancement of unclear etiology with workup still being underway with outpatient neurology, seizures, complex migraine and migraine headaches, presenting for sudden onset of left-sided weakness different than his usual complex migraine.  On examination his concerning findings of left hemiparesis and left-sided hemisensory loss.  Imaging-CT head shows stable hyperintensities of unknown significance in the left frontoparietal and also somewhat in the right hemisphere.  TNK felt to unsafe given these findings with risk of bleeding that could be potentially high. CT angiography with right PCA  occlusion-deemed to be correlating with the left-sided sensory and motor symptoms for which she was offered thrombectomy, which she agreed to after discussing risks, benefits and alternatives.  His partner Standley Brooking was also notified.  Her phone number is [phone] Will be admitted post  IR to the neuro ICU under neurology/stroke service.  Stroke team to follow.  Workup as above.  Aspirin has not been used in the past due to concern for bleed-I will continue to hold aspirin unless required postprocedurally.  Please feel free to call with any questions.  -- Amie Portland, MD Neurologist [facility] Pager: [phone]  CRITICAL CARE ATTESTATION Performed by: Amie Portland, MD Total critical care time: 45 minutes Critical care time was exclusive of separately billable procedures and treating other patients and/or supervising APPs/Residents/Students Critical care was necessary to treat or prevent imminent or life-threatening deterioration. This patient is critically ill and at significant risk for neurological worsening and/or death and care requires constant monitoring. Critical care was time spent personally by me on the following activities: development of treatment plan with patient and/or surrogate as well as nursing, discussions with consultants, evaluation of patient's response to treatment, examination of patient, obtaining history from patient or surrogate, ordering and performing treatments and interventions, ordering and review of laboratory studies, ordering and review of radiographic studies, pulse oximetry, re-evaluation of patient's condition, participation in multidisciplinary rounds and medical decision making of high complexity in the care of this patient.

## 2023-02-10 NOTE — ED Notes (Signed)
Patient consented for IR with Neuro at bedside, patient moved to IR bay 1, report given to IR RN by Stroke RN.

## 2023-02-10 NOTE — Anesthesia Procedure Notes (Addendum)
Arterial Line Insertion Start/End3/26/2024 2:28 PM, 02/10/2023 2:31 PM Performed by: Rande Brunt, CRNA, CRNA  Patient location: Pre-op. Preanesthetic checklist: patient identified, IV checked, site marked, risks and benefits discussed, surgical consent, monitors and equipment checked, pre-op evaluation, timeout performed and anesthesia consent Lidocaine 1% used for infiltration Left, radial was placed Catheter size: 20 G Hand hygiene performed  and maximum sterile barriers used   Attempts: 1 Procedure performed without using ultrasound guided technique. Following insertion, dressing applied and Biopatch. Post procedure assessment: normal and unchanged  Patient tolerated the procedure well with no immediate complications.

## 2023-02-11 ENCOUNTER — Encounter (HOSPITAL_COMMUNITY): Payer: Self-pay | Admitting: Radiology

## 2023-02-11 ENCOUNTER — Inpatient Hospital Stay (HOSPITAL_COMMUNITY): Payer: Medicare Other

## 2023-02-11 DIAGNOSIS — I6389 Other cerebral infarction: Secondary | ICD-10-CM

## 2023-02-11 DIAGNOSIS — I639 Cerebral infarction, unspecified: Secondary | ICD-10-CM | POA: Diagnosis not present

## 2023-02-11 LAB — COMPREHENSIVE METABOLIC PANEL
ALT: 21 U/L (ref 0–44)
AST: 17 U/L (ref 15–41)
Albumin: 3.4 g/dL — ABNORMAL LOW (ref 3.5–5.0)
Alkaline Phosphatase: 51 U/L (ref 38–126)
Anion gap: 7 (ref 5–15)
BUN: 11 mg/dL (ref 8–23)
CO2: 22 mmol/L (ref 22–32)
Calcium: 8.3 mg/dL — ABNORMAL LOW (ref 8.9–10.3)
Chloride: 107 mmol/L (ref 98–111)
Creatinine, Ser: 0.76 mg/dL (ref 0.61–1.24)
GFR, Estimated: >60 mL/min (ref >60)
Glucose, Bld: 125 mg/dL — ABNORMAL HIGH (ref 70–99)
Potassium: 3.8 mmol/L (ref 3.5–5.1)
Sodium: 136 mmol/L (ref 135–145)
Total Bilirubin: 0.7 mg/dL (ref 0.3–1.2)
Total Protein: 5.6 g/dL — ABNORMAL LOW (ref 6.5–8.1)

## 2023-02-11 LAB — LIPID PANEL
Cholesterol: 199 mg/dL (ref 0–200)
HDL: 39 mg/dL — ABNORMAL LOW (ref >40)
LDL Cholesterol: 144 mg/dL — ABNORMAL HIGH (ref 0–99)
Total CHOL/HDL Ratio: 5.1 RATIO
Triglycerides: 81 mg/dL (ref <150)
VLDL: 16 mg/dL (ref 0–40)

## 2023-02-11 LAB — CBC WITH DIFFERENTIAL/PLATELET
Abs Immature Granulocytes: 0.01 10*3/uL (ref 0.00–0.07)
Basophils Absolute: 0 10*3/uL (ref 0.0–0.1)
Basophils Relative: 0 %
Eosinophils Absolute: 0 10*3/uL (ref 0.0–0.5)
Eosinophils Relative: 0 %
HCT: 37.4 % — ABNORMAL LOW (ref 39.0–52.0)
Hemoglobin: 13 g/dL (ref 13.0–17.0)
Immature Granulocytes: 0 %
Lymphocytes Relative: 23 %
Lymphs Abs: 1.1 10*3/uL (ref 0.7–4.0)
MCH: 29.3 pg (ref 26.0–34.0)
MCHC: 34.8 g/dL (ref 30.0–36.0)
MCV: 84.4 fL (ref 80.0–100.0)
Monocytes Absolute: 0.6 10*3/uL (ref 0.1–1.0)
Monocytes Relative: 12 %
Neutro Abs: 3.1 10*3/uL (ref 1.7–7.7)
Neutrophils Relative %: 65 %
Platelets: 158 10*3/uL (ref 150–400)
RBC: 4.43 MIL/uL (ref 4.22–5.81)
RDW: 13.2 % (ref 11.5–15.5)
WBC: 4.8 10*3/uL (ref 4.0–10.5)
nRBC: 0 % (ref 0.0–0.2)

## 2023-02-11 LAB — ECHOCARDIOGRAM COMPLETE
AR max vel: 3.24 cm2
AV Area VTI: 3 cm2
AV Area mean vel: 3.19 cm2
AV Mean grad: 4 mmHg
AV Peak grad: 7.7 mmHg
Ao pk vel: 1.39 m/s
Area-P 1/2: 3.85 cm2
Height: 75 in
S' Lateral: 3.4 cm
Weight: 2998.26 oz

## 2023-02-11 LAB — HEMOGLOBIN A1C
Hgb A1c MFr Bld: 6.5 % — ABNORMAL HIGH (ref 4.8–5.6)
Mean Plasma Glucose: 140 mg/dL

## 2023-02-11 MED ORDER — ATORVASTATIN CALCIUM 40 MG PO TABS
40.0000 mg | ORAL_TABLET | Freq: Every day | ORAL | Status: DC
  Administered 2023-02-11 – 2023-02-12 (×2): 40 mg via ORAL
  Filled 2023-02-11 (×2): qty 1

## 2023-02-11 MED ORDER — PANTOPRAZOLE SODIUM 40 MG PO TBEC
40.0000 mg | DELAYED_RELEASE_TABLET | Freq: Every day | ORAL | Status: DC
  Administered 2023-02-11 – 2023-02-12 (×2): 40 mg via ORAL
  Filled 2023-02-11 (×2): qty 1

## 2023-02-11 MED ORDER — CLOPIDOGREL BISULFATE 75 MG PO TABS
75.0000 mg | ORAL_TABLET | Freq: Every day | ORAL | Status: DC
  Administered 2023-02-11 – 2023-02-12 (×2): 75 mg via ORAL
  Filled 2023-02-11 (×2): qty 1

## 2023-02-11 MED ORDER — SODIUM CHLORIDE 0.9 % IV BOLUS
500.0000 mL | Freq: Once | INTRAVENOUS | Status: AC
  Administered 2023-02-11: 500 mL via INTRAVENOUS

## 2023-02-11 MED ORDER — ENOXAPARIN SODIUM 30 MG/0.3ML IJ SOSY
30.0000 mg | PREFILLED_SYRINGE | INTRAMUSCULAR | Status: DC
  Administered 2023-02-11: 30 mg via SUBCUTANEOUS
  Filled 2023-02-11: qty 0.3

## 2023-02-11 MED ORDER — ASPIRIN 81 MG PO CHEW
81.0000 mg | CHEWABLE_TABLET | Freq: Every day | ORAL | Status: DC
  Administered 2023-02-11 – 2023-02-12 (×2): 81 mg via ORAL
  Filled 2023-02-11 (×2): qty 1

## 2023-02-11 NOTE — Progress Notes (Signed)
PT Cancellation Note  Patient Details Name: BERLON WIRTHLIN MRN: [id number] DOB: 27-Sep-1961   Cancelled Treatment:    Reason Eval/Treat Not Completed: Active bedrest order; PT will follow up.   Reginia Naas 02/11/2023, 8:57 AM Magda Kiel, PT Acute Rehabilitation Services [phone] 02/11/2023

## 2023-02-11 NOTE — Progress Notes (Signed)
Pt's systolic blood pressure dropped during night to 100-120 while sleeping. SBP returned to goal range when pt was awakened. Dr. Lorrin Goodell notified. Two 500 mL boluses given per orders with minimal improvement. Nurse instructed by Dr. Lorrin Goodell to perform no further interventions at this time.

## 2023-02-11 NOTE — Progress Notes (Addendum)
STROKE TEAM PROGRESS NOTE   INTERVAL HISTORY His family is at the bedside.  He states yesterday he developed left side weakness, numbness and tingling and a headache.  He was not a candidate for TNK due to past MRIs with multiple focal enhancing lesions and concern about increased risk of bleeding with thrombolysis.. CTA revealed an acute right P2 PCA occlusion.  He was sent to IR for mechanical thrombectomy with complete revascularization with a TICI 3.  Complains of a headache this morning and states that his left hand is still a little bit weak Blood pressure goal is 1 20-1/61 24 hours after thrombectomy not requiring any IV drips LDL 144 will add atorvastatin.  MRI brain with scattered acute ischemic infarcts in right PCA and bilateral cerebellum Will start aspirin 81 mg and Plavix 75 mg daily for 3 weeks then aspirin alone Transfer the patient out of the ICU to the floor  Vitals:   02/11/23 1200 02/11/23 1300 02/11/23 1400 02/11/23 1500  BP: 110/70 96/62 (!) 140/80 98/68  Pulse: 85 74    Resp: 16 17 18  (!) 21  Temp:      TempSrc:      SpO2: 94% 93% 95% 98%  Weight:      Height:       CBC:  Recent Labs  Lab 02/10/23 1346 02/10/23 1352 02/11/23 0643  WBC 4.2  --  4.8  NEUTROABS 2.9  --  3.1  HGB 15.2 14.6 13.0  HCT 45.1 43.0 37.4*  MCV 85.7  --  84.4  PLT 177  --  0000000   Basic Metabolic Panel:  Recent Labs  Lab 02/10/23 1346 02/10/23 1352 02/11/23 0643  NA 137 139 136  K 3.9 3.9 3.8  CL 103 104 107  CO2 23  --  22  GLUCOSE 139* 138* 125*  BUN 21 23 11   CREATININE 0.85 0.80 0.76  CALCIUM 9.3  --  8.3*   Lipid Panel:  Recent Labs  Lab 02/11/23 0643  CHOL 199  TRIG 81  HDL 39*  CHOLHDL 5.1  VLDL 16  LDLCALC 144*   HgbA1c: No results for input(s): "HGBA1C" in the last 168 hours. Urine Drug Screen:  Recent Labs  Lab 02/10/23 1625  LABOPIA NONE DETECTED  COCAINSCRNUR NONE DETECTED  LABBENZ NONE DETECTED  AMPHETMU NONE DETECTED  THCU NONE DETECTED   LABBARB NONE DETECTED    Alcohol Level  Recent Labs  Lab 02/10/23 1346  ETH <10    IMAGING past 24 hours ECHOCARDIOGRAM COMPLETE  Result Date: 02/11/2023    ECHOCARDIOGRAM REPORT   Patient Name:   Ralph Simpson Date of Exam: 02/11/2023 Medical Rec #:  [id number]      Height:       75.0 in Accession #:    [id number]     Weight:       187.4 lb Date of Birth:  1960/12/20      BSA:          2.134 m Patient Age:    62 years       BP:           101/65 mmHg Patient Gender: M              HR:           83 bpm. Exam Location:  Inpatient Procedure: 2D Echo, Cardiac Doppler and Color Doppler Indications:    Stroke I63.9  History:        Patient has no  prior history of Echocardiogram examinations.                 Stroke; Risk Factors:Diabetes and Dyslipidemia. Chronic                 Migraine.  Sonographer:    Ronny Flurry Referring Phys: [id number] [place]  1. Left ventricular ejection fraction, by estimation, is 55 to 60%. The left ventricle has normal function. The left ventricle has no regional wall motion abnormalities. Left ventricular diastolic parameters are indeterminate.  2. Right ventricular systolic function is normal. The right ventricular size is normal.  3. The mitral valve is normal in structure. No evidence of mitral valve regurgitation. No evidence of mitral stenosis.  4. The aortic valve is normal in structure. Aortic valve regurgitation is not visualized. No aortic stenosis is present.  5. The inferior vena cava is normal in size with greater than 50% respiratory variability, suggesting right atrial pressure of 3 mmHg. FINDINGS  Left Ventricle: Left ventricular ejection fraction, by estimation, is 55 to 60%. The left ventricle has normal function. The left ventricle has no regional wall motion abnormalities. The left ventricular internal cavity size was normal in size. There is  no left ventricular hypertrophy. Left ventricular diastolic parameters are indeterminate. Right  Ventricle: The right ventricular size is normal. No increase in right ventricular wall thickness. Right ventricular systolic function is normal. Left Atrium: Left atrial size was normal in size. Right Atrium: Right atrial size was normal in size. Pericardium: There is no evidence of pericardial effusion. Mitral Valve: The mitral valve is normal in structure. No evidence of mitral valve regurgitation. No evidence of mitral valve stenosis. Tricuspid Valve: The tricuspid valve is normal in structure. Tricuspid valve regurgitation is not demonstrated. No evidence of tricuspid stenosis. Aortic Valve: The aortic valve is normal in structure. Aortic valve regurgitation is not visualized. No aortic stenosis is present. Aortic valve mean gradient measures 4.0 mmHg. Aortic valve peak gradient measures 7.7 mmHg. Aortic valve area, by VTI measures 3.00 cm. Pulmonic Valve: The pulmonic valve was normal in structure. Pulmonic valve regurgitation is not visualized. No evidence of pulmonic stenosis. Aorta: The aortic root is normal in size and structure. Venous: The inferior vena cava is normal in size with greater than 50% respiratory variability, suggesting right atrial pressure of 3 mmHg. IAS/Shunts: No atrial level shunt detected by color flow Doppler.  LEFT VENTRICLE PLAX 2D LVIDd:         5.00 cm   Diastology LVIDs:         3.40 cm   LV e' medial:    8.59 cm/s LV PW:         0.90 cm   LV E/e' medial:  7.1 LV IVS:        0.90 cm   LV e' lateral:   11.40 cm/s LVOT diam:     2.20 cm   LV E/e' lateral: 5.4 LV SV:         82 LV SV Index:   38 LVOT Area:     3.80 cm  RIGHT VENTRICLE             IVC RV S prime:     10.30 cm/s  IVC diam: 1.60 cm TAPSE (M-mode): 1.6 cm LEFT ATRIUM             Index        RIGHT ATRIUM           Index  LA diam:        3.40 cm 1.59 cm/m   RA Area:     18.65 cm LA Vol (A2C):   51.2 ml 23.99 ml/m  RA Volume:   51.50 ml  24.13 ml/m LA Vol (A4C):   43.9 ml 20.57 ml/m LA Biplane Vol: 50.0 ml 23.42  ml/m  AORTIC VALVE AV Area (Vmax):    3.24 cm AV Area (Vmean):   3.19 cm AV Area (VTI):     3.00 cm AV Vmax:           139.00 cm/s AV Vmean:          93.000 cm/s AV VTI:            0.273 m AV Peak Grad:      7.7 mmHg AV Mean Grad:      4.0 mmHg LVOT Vmax:         118.33 cm/s LVOT Vmean:        78.067 cm/s LVOT VTI:          0.215 m LVOT/AV VTI ratio: 0.79  AORTA Ao Root diam: 3.90 cm Ao Asc diam:  3.10 cm MITRAL VALVE MV Area (PHT): 3.85 cm    SHUNTS MV Decel Time: 197 msec    Systemic VTI:  0.22 m MV E velocity: 61.10 cm/s  Systemic Diam: 2.20 cm MV A velocity: 97.00 cm/s MV E/A ratio:  0.63 Kardie Tobb DO Electronically signed by Berniece Salines DO Signature Date/Time: 02/11/2023/2:24:37 PM    Final    MR BRAIN WO CONTRAST  Result Date: 02/11/2023 CLINICAL DATA:  Stroke follow-up EXAM: MRI HEAD WITHOUT CONTRAST TECHNIQUE: Multiplanar, multiecho pulse sequences of the brain and surrounding structures were obtained without intravenous contrast. COMPARISON:  Head CT and CTA from yesterday FINDINGS: Brain: Scattered acute infarcts in the posterior circulation including at the right hippocampus, lateral right thalamus, right occipital cortex, and bilateral cerebellum. The largest area of infarction is at the hippocampus. FLAIR hyperintensity in the cerebral white matter, likely chronic small vessel ischemia. Chronic FLAIR hyperintensity at left precentral sulcus which has been seen on prior imaging to enhance, favoring remote inflammation with scarring given continued stability. No hydrocephalus or masslike finding. Vascular: Grossly preserved arterial flow voids.  Preceding CTA. Skull and upper cervical spine: Negative Sinuses/Orbits: Left cataract resection.  No acute finding IMPRESSION: 1. Scattered acute infarcts in the right PCA distribution and in the bilateral cerebellum as described. 2. Stable sulcal signal abnormality at the superior left frontal lobe. Electronically Signed   By: Jorje Guild M.D.   On:  02/11/2023 06:14    PHYSICAL EXAM  Temp:  [97.4 F (36.3 C)-98.2 F (36.8 C)] 98 F (36.7 C) (03/27 1151) Pulse Rate:  [72-100] 74 (03/27 1300) Resp:  [9-31] 21 (03/27 1500) BP: (79-140)/(45-95) 134/75 (03/27 1515) SpO2:  [90 %-98 %] 98 % (03/27 1500) Arterial Line BP: (100-156)/(58-75) 121/61 (03/27 1300) Weight:  [85 kg] 85 kg (03/26 1615)  General - Well nourished, well developed pleasant middle-aged Caucasian male, in no apparent distress. Cardiovascular - Regular rhythm and rate.  Mental Status -  Level of arousal and orientation to time, place, and person were intact. Language including expression, naming, repetition, comprehension was assessed and found intact. Attention span and concentration were normal. Recent and remote memory were intact. Fund of Knowledge was assessed and was intact.  Cranial Nerves II - XII - II - Visual field intact OU. III, IV, VI - Extraocular movements intact. V -  Facial sensation intact bilaterally. VII - Facial movement intact bilaterally. VIII - Hearing & vestibular intact bilaterally. X - Palate elevates symmetrically. XI - Chin turning & shoulder shrug intact bilaterally. XII - Tongue protrusion intact.  Motor Strength - The patient's strength was normal in all extremities and pronator drift was absent.  Slight fine motor skills are diminished in the left hand bulk was normal and fasciculations were absent.   Motor Tone - Muscle tone was assessed at the neck and appendages and was normal.  Sensory -slight numbness still in left arm  Coordination - The patient had normal movements in the hands and feet with no ataxia or dysmetria.  Tremor was absent.  Gait and Station - deferred.  ASSESSMENT/PLAN Ralph Simpson is a 62 y.o. male with history of complex migraine headaches, neutropenia, complex partial seizures on lamotrigine and Keppra, abnormal brain MRI with multifocal enhancing lesions, mostly leptomeningeal and largely at the  left frontal region but slightly at the right frontoparietal and left occipital regions who presented to the ED via EMS on 02/10/2023 for evaluation of acute onset of headache with left arm weakness, numbness, tingling and left leg weakness and numbness.  S/P mechanical thrombectomy of P2  Stroke: Scattered acute ischemic infarcts in right PCA and bilateral cerebellum S/P mechanical thrombectomy of right P2 occlusion with successful TICI 3 revascularization. Etiology: Likely cardioembolic Code Stroke  CT head No acute abnormality. ASPECTS 10.    CTA head & neck right P2 occlusion MRI scattered acute infarcts in right PCA and bilateral cerebellum 2D Echo 55 to 60% LDL 144 HgbA1c 6.7 Recommend 30-day heart monitor after discharge.  As patient is not a good candidate for anticoagulation secondary to his multifocal enhancing lesions of unknown etiology.  Patient is unwilling to consider brain biopsy UDS negative VTE prophylaxis -Lovenox    Diet   Diet Carb Modified Fluid consistency: Thin; Room service appropriate? Yes   No antithrombotic prior to admission, now on aspirin 81 mg and Plavix 75 mg.  For 3 weeks then aspirin alone Therapy recommendations: Pending Disposition: Pending  Hypertension Home meds: None Stable Long-term BP goal normotensive  Hyperlipidemia Home meds: None LDL 144, goal < 70 Add atorvastatin 40 mg Continue statin at discharge  Seizure disorder Complex migraines Abnormal brain MRI Continue home Keppra and Lamictal And Fioricet if needed for headache management Prior MRIs -stable multiple enhancing lesions mostly leptomeningeal in left frontal region, right frontal parietal and left occipital region and has declined brain biopsy  Other Active Problems GERD-continue home [facility] day # [place], [facility]  [facility]  I have personally obtained history,examined this patient, reviewed notes, independently viewed imaging  studies, participated in medical decision making and plan of care.ROS completed by me personally and pertinent positives fully documented  I have made any additions or clarifications directly to the above note. Agree with note above.  Patient presented with left hemiparesis and numbness secondary to right P2 occlusion and due to concerns about increased bleeding risk into his enhancing multiple cortical brain lesions of unknown etiology thrombolysis was not done.  However he went for emergent thrombectomy which was successful and patient seems to be doing well.  Continue close neurological monitoring and strict blood pressure control as per post thrombectomy protocol.  Mobilize out of bed.  Therapy consults.  Continue cardiac monitoring and ongoing stroke workup.  Aspirin and Plavix for 3 weeks followed by aspirin alone.  Will consider only 30-day heart  monitor at discharge and not loop recorder as he may not be a good long-term anticoagulation candidate due to his enhancing brain lesions of unknown known etiology and he still presently refusing brain biopsy for that.  Discussed with family at the bedside and answered questions.  Discussed with Dr. Estanislado Pandy..This patient is critically ill and at significant risk of neurological worsening, death and care requires constant monitoring of vital signs, hemodynamics,respiratory and cardiac monitoring, extensive review of multiple databases, frequent neurological assessment, discussion with family, other specialists and medical decision making of high complexity.I have made any additions or clarifications directly to the above note.This critical care time does not reflect procedure time, or teaching time or supervisory time of PA/NP/Med Resident etc but could involve care discussion time.  I spent 30 minutes of neurocritical care time  in the care of  this patient.       Antony Contras, MD Medical Director [facility] Pager: [phone] 02/11/2023  4:47 PM   To contact Stroke Continuity provider, please refer to [email or web address] After hours, contact General Neurology

## 2023-02-11 NOTE — Progress Notes (Signed)
Echocardiogram 2D Echocardiogram has been performed.  Ronny Flurry 02/11/2023, 12:59 PM

## 2023-02-11 NOTE — Progress Notes (Signed)
  Transition of Care [facility]) Screening Note   Patient Details  Name: TOWNSEND FLOCCO Date of Birth: Apr 16, 1961   Transition of [facility]) CM/SW Contact:    Ella Bodo, RN Phone Number: 02/11/2023, 5:09 PM    Transition of Care Department [facility]) has reviewed patient and no TOC needs have been identified at this time. We will continue to monitor patient advancement through interdisciplinary progression rounds. If new patient transition needs arise, please place a TOC consult.  PT/OT recommending no OP follow up; will follow progress.   Reinaldo Raddle, RN, BSN  Trauma/Neuro ICU Case Manager [phone]

## 2023-02-11 NOTE — Evaluation (Signed)
Physical Therapy Evaluation Patient Details Name: Ralph Simpson MRN: [id number] DOB: Jul 25, 1961 Today's Date: 02/11/2023  History of Present Illness  62 y.o. male presents to [facility] hospital on 02/10/2023 with acute onset HA, and L sided weakness/numbness. Imaging identifies R PCA embolic infarct. Pt underwent thrombectomy of R PCA. PMH includes migraines, complex partial seizures, GERD, OA.  Clinical Impression  Pt presents to PT with deficits in vision, gait, balance. Pt reports blurred vision, in spots that move around, but are most often in left visual field. Pt also reports mild instability at this time, but utilizes cane well and is able to ambulate for household distances and negotiate stairs without physical assistance. PT will follow up during this admission for further dynamic gait and balance training. PT recommends discharge home when medically ready.       Recommendations for follow up therapy are one component of a multi-disciplinary discharge planning process, led by the attending physician.  Recommendations may be updated based on patient status, additional functional criteria and insurance authorization.  Follow Up Recommendations       Assistance Recommended at Discharge PRN  Patient can return home with the following  Assist for transportation;Assistance with cooking/housework    Equipment Recommendations None recommended by PT  Recommendations for Other Services       Functional Status Assessment Patient has had a recent decline in their functional status and demonstrates the ability to make significant improvements in function in a reasonable and predictable amount of time.     Precautions / Restrictions Precautions Precautions: Fall Precaution Comments: hx of seizures Restrictions Weight Bearing Restrictions: No Other Position/Activity Restrictions: LUE restrictions 2/2 radial cath      Mobility  Bed Mobility Overal bed mobility: Independent                   Transfers Overall transfer level: Modified independent Equipment used: Straight cane                    Ambulation/Gait Ambulation/Gait assistance: Supervision Gait Distance (Feet): 200 Feet Assistive device: Straight cane Gait Pattern/deviations: Step-through pattern, Decreased stride length, Narrow base of support Gait velocity: functional Gait velocity interpretation: 1.31 - 2.62 ft/sec, indicative of limited community ambulator   General Gait Details: step-through gait, mild lateal sway which pt corrects with stepping strategy vs cane  Stairs Stairs: Yes Stairs assistance: Supervision Stair Management: One rail Right, Forwards, Step to pattern Number of Stairs: 4    Wheelchair Mobility    Modified Rankin (Stroke Patients Only) Modified Rankin (Stroke Patients Only) Pre-Morbid Rankin Score: Slight disability Modified Rankin: Slight disability     Balance Overall balance assessment: Needs assistance Sitting-balance support: No upper extremity supported, Feet supported Sitting balance-Leahy Scale: Good     Standing balance support: No upper extremity supported, During functional activity Standing balance-Leahy Scale: Good           Rhomberg - Eyes Opened: 15 Rhomberg - Eyes Closed: 30                 Pertinent Vitals/Pain Pain Assessment Pain Assessment: No/denies pain    Home Living Family/patient expects to be discharged to:: Private residence Living Arrangements: Non-relatives/Friends Available Help at Discharge: Friend(s);Available PRN/intermittently Type of Home: House Home Access: Stairs to enter Entrance Stairs-Rails: Right Entrance Stairs-Number of Steps: 4   Home Layout: Able to live on main level with bedroom/bathroom Home Equipment: Cane - single point;Wheelchair - manual;Crutches      Prior Function Prior  Level of Function : Needs assist             Mobility Comments: ambulates with SPC ADLs Comments:  assistance for transportation     Hand Dominance   Dominant Hand: Right    Extremity/Trunk Assessment   Upper Extremity Assessment Upper Extremity Assessment: Overall WFL for tasks assessed    Lower Extremity Assessment Lower Extremity Assessment: Overall WFL for tasks assessed (chronic deficits in L hip from prior surgeries)    Cervical / Trunk Assessment Cervical / Trunk Assessment: Normal  Communication   Communication: No difficulties  Cognition Arousal/Alertness: Awake/alert Behavior During Therapy: WFL for tasks assessed/performed Overall Cognitive Status: Within Functional Limits for tasks assessed                                          General Comments General comments (skin integrity, edema, etc.): VSS on RA, pt reports continued blurred vision in left visual field    Exercises     Assessment/Plan    PT Assessment Patient needs continued PT services  PT Problem List Decreased balance;Decreased activity tolerance;Decreased mobility       PT Treatment Interventions DME instruction;Gait training;Stair training;Balance training;Neuromuscular re-education;Patient/family education    PT Goals (Current goals can be found in the Care Plan section)  Acute Rehab PT Goals Patient Stated Goal: to return to independence PT Goal Formulation: With patient Time For Goal Achievement: 02/25/23 Potential to Achieve Goals: Good Additional Goals Additional Goal #1: Pt will score >19/24 on the DGI to indicate a reduced risk for falls Additional Goal #2: Pt will score >45/56 on the BERG to indicate a reduced risk for falls    Frequency Min 2X/week     Co-evaluation               AM-PAC PT "6 Clicks" Mobility  Outcome Measure Help needed turning from your back to your side while in a flat bed without using bedrails?: None Help needed moving from lying on your back to sitting on the side of a flat bed without using bedrails?: None Help needed  moving to and from a bed to a chair (including a wheelchair)?: None Help needed standing up from a chair using your arms (e.g., wheelchair or bedside chair)?: None Help needed to walk in hospital room?: A Little Help needed climbing 3-5 steps with a railing? : A Little 6 Click Score: 22    End of Session Equipment Utilized During Treatment: Gait belt Activity Tolerance: Patient tolerated treatment well Patient left: in chair;with call bell/phone within reach;with chair alarm set Nurse Communication: Mobility status PT Visit Diagnosis: Other abnormalities of gait and mobility (R26.89);Other symptoms and signs involving the nervous system (R29.898)    Time: [id number] PT Time Calculation (min) (ACUTE ONLY): 16 min   Charges:   PT Evaluation $PT Eval Low Complexity: [place], PT, DPT Acute Rehabilitation Office [phone]   Zenaida Niece 02/11/2023, 3:00 PM

## 2023-02-11 NOTE — Evaluation (Signed)
Occupational Therapy Evaluation Patient Details Name: Ralph Simpson MRN: [id number] DOB: Mar 23, 1961 Today's Date: 02/11/2023   History of Present Illness 62 y.o. male presents to [facility] hospital on 02/10/2023 with acute onset HA, and L sided weakness/numbness. Imaging identifies R PCA embolic infarct. Pt underwent thrombectomy of R PCA. PMH includes migraines, complex partial seizures, GERD, OA.   Clinical Impression   Patient evaluated by Occupational Therapy with no further acute OT needs identified. All education has been completed and the patient has no further questions. Prior to this admit, pt reports that he was independent with all ADL tasks using a SPC for functional mobility. He does not drive due to his hx of seizures. Reports visual deficits that have not resolved since his migraine although able to navigate within room and department with SBA. Pt reports that his friend, [name] will be able to provide assistance at home if needed.  OT is signing off. Thank you for this referral.       Recommendations for follow up therapy are one component of a multi-disciplinary discharge planning process, led by the attending physician.  Recommendations may be updated based on patient status, additional functional criteria and insurance authorization.   Assistance Recommended at Discharge PRN  Patient can return home with the following Assist for transportation;Assistance with cooking/housework    Functional Status Assessment  Patient has had a recent decline in their functional status and demonstrates the ability to make significant improvements in function in a reasonable and predictable amount of time.  Equipment Recommendations  None recommended by OT       Precautions / Restrictions Precautions Precautions: Fall Precaution Comments: hx of seizures Restrictions Weight Bearing Restrictions: No Other Position/Activity Restrictions: LUE restrictions 2/2 radial cath      Mobility Bed  Mobility Overal bed mobility: Independent     Patient Response: Cooperative  Transfers Overall transfer level: Modified independent Equipment used: Straight cane      Balance Overall balance assessment: Needs assistance Sitting-balance support: No upper extremity supported, Feet supported Sitting balance-Leahy Scale: Good     Standing balance support: No upper extremity supported, During functional activity Standing balance-Leahy Scale: Good       ADL either performed or assessed with clinical judgement   ADL Overall ADL's : Modified independent;At baseline             Vision Baseline Vision/History: 1 Wears glasses (reading) Ability to See in Adequate Light: 0 Adequate Patient Visual Report: Other (comment) (Reports the left side vision in each eye to be blurry. Also reports black spots that move when he looks around. Denies hx of glaucoma or AMD. Reports cataract surgery in the past.) Vision Assessment?: Yes Additional Comments: Light sensitivity due to migraine history. Reports that he usually has visual changes when he gets a migraine although they typically resolve once the migraine is gone. At eval, pt reports no migraine and continued visual issues. Able to navigate in hallway, stairwell, in room, and bathroom without difficulty. Able to locate all supplies needed for brushing teeth on sink counter.            Pertinent Vitals/Pain Pain Assessment Pain Assessment: No/denies pain     Hand Dominance Right   Extremity/Trunk Assessment Upper Extremity Assessment Upper Extremity Assessment: Overall WFL for tasks assessed   Lower Extremity Assessment Lower Extremity Assessment: Defer to PT evaluation   Cervical / Trunk Assessment Cervical / Trunk Assessment: Normal   Communication Communication Communication: No difficulties   Cognition Arousal/Alertness: Awake/alert  Behavior During Therapy: WFL for tasks assessed/performed Overall Cognitive Status:  Within Functional Limits for tasks assessed     General Comments  VSS on RA            Home Living Family/patient expects to be discharged to:: Private residence Living Arrangements: Non-relatives/Friends (Friend: [name]) Available Help at Discharge: Friend(s);Available PRN/intermittently Type of Home: House Home Access: Stairs to enter [employer] of Steps: 4 Entrance Stairs-Rails: Right Home Layout: Able to live on main level with bedroom/bathroom     Bathroom Shower/Tub: Teacher, early years/pre: Standard     Home Equipment: Cane - single point;Wheelchair - manual;Crutches          Prior Functioning/Environment Prior Level of Function : Needs assist     Mobility Comments: ambulates with SPC ADLs Comments: assistance for transportation due to hx of seizures        OT Problem List: Decreased strength      OT Treatment/Interventions:   Eval only   OT Goals(Current goals can be found in the care plan section) Acute Rehab OT Goals Patient Stated Goal: to go home  OT Frequency:  1X visit    Co-evaluation PT/OT/SLP Co-Evaluation/Treatment: Yes Reason for Co-Treatment: To address functional/ADL transfers   OT goals addressed during session: ADL's and self-care;Strengthening/ROM      AM-PAC OT "6 Clicks" Daily Activity     Outcome Measure Help from another person eating meals?: None Help from another person taking care of personal grooming?: None Help from another person toileting, which includes using toliet, bedpan, or urinal?: None Help from another person bathing (including washing, rinsing, drying)?: None Help from another person to put on and taking off regular upper body clothing?: None Help from another person to put on and taking off regular lower body clothing?: None 6 Click Score: 24   End of Session Equipment Utilized During Treatment: Gait belt;Other (comment) [facility]) Nurse Communication: Mobility status  Activity  Tolerance: Patient tolerated treatment well Patient left: in chair;with call bell/phone within reach;with chair alarm set  OT Visit Diagnosis: Muscle weakness (generalized) (M62.81)                Time: [id number] OT Time Calculation (min): 29 min Charges:  OT General Charges $OT Visit: 1 Visit OT Evaluation $OT Eval Low Complexity: 1 Low OT Treatments $Self Care/Home Management : 8-22 mins  Ailene Ravel, OTR/L,CBIS  Supplemental OT - [facility] and [facility] Secure Chat Preferred    Keontae Levingston, Clarene Duke 02/11/2023, 4:01 PM

## 2023-02-11 NOTE — Progress Notes (Signed)
Referring Physician(s): Amie Portland  Supervising Physician: Luanne Bras  Patient Status:  [facility] - In-pt  Chief Complaint:  F/u after cerebral intervention  Brief History:  Ralph Simpson is a 62 y.o. male who presented to the ED via EMS on 02/10/2023 for evaluation of acute onset of headache with left arm weakness, numbness, tingling and left leg weakness and numbness.   Code stroke called.  Found to have an occluded right posterior cerebral artery distal P1/P2 junction.   Status post complete revascularization of occluded right posterior cerebral artery distal P1 P2 junction with 1 pass with contact aspiration using an 054 zoom aspiration catheter withTICI 3 revascularization by Dr. Estanislado Pandy  Subjective:  Doing ok. Family at bedside.  Allergies: Aspirin  Medications: Prior to Admission medications   Medication Sig Start Date End Date Taking? Authorizing Provider  Blood Glucose Monitoring Suppl (ONETOUCH VERIO) w/Device KIT Used to check blood sugar once a day. DX E11.9 05/01/22   Jearld Fenton, NP  cyclobenzaprine (FLEXERIL) 10 MG tablet Take 1 tablet (10 mg total) by mouth at bedtime as needed for muscle spasms. 08/22/22   Jearld Fenton, NP  glucose blood (RELION GLUCOSE TEST STRIPS) test strip Use as instructed 08/22/22   Jearld Fenton, NP  lamoTRIgine (LAMICTAL) 100 MG tablet Take 2 tablets (200 mg total) by mouth 2 (two) times daily. 01/20/23   Marcial Pacas, MD  Lancet Devices (RELION LANCING DEVICE) MISC Use to check blood sugar one a day.  DX: E11.9 08/22/22   Jearld Fenton, NP  levETIRAcetam (KEPPRA) 500 MG tablet Take 1 tablet (500 mg total) by mouth 2 (two) times daily. 01/20/23 08/18/23  Marcial Pacas, MD  OneTouch Delica Lancets 99991111 MISC Use to check blood sugar once a day.  DX: E11.9 05/01/22   Jearld Fenton, NP  pantoprazole (PROTONIX) 40 MG tablet Take by mouth. 07/29/21   [provider]  Rimegepant Sulfate (NURTEC) 75 MG TBDP Take 1 tab at onset  of migraine.  May repeat in 2 hrs, if needed.  Max dose: 2 tabs/day. This is a 30 day prescription. 07/16/21   Marcial Pacas, MD     Vital Signs: BP (!) 140/80   Pulse 74   Temp 98 F (36.7 C) (Axillary)   Resp 18   Ht 6\' 3"  (1.905 m)   Wt 187 lb 6.3 oz (85 kg)   SpO2 (!) 88%   BMI 23.42 kg/m   Physical Exam Alert, awake, and oriented x3. Speech and comprehension intact. PERRL bilaterally. EOMs intact bilaterally without nystagmus or subjective diplopia. Visual fields intact bilaterally. No facial asymmetry. Tongue midline. Motor power symmetric proportional to effort. No pronator drift. Fine motor and coordination intact and symmetric. 5/5 Strength bilaterally Common femoral artery puncture site looks good, no bleeding, no hematoma, no pseudoaneurysm Gait not assessed. Romberg not assessed. Heel to toe not assessed. Distal pulses palpable with Doppler bilaterally.    Labs:  CBC: Recent Labs    06/30/22 1415 11/15/22 1300 02/10/23 1346 02/10/23 1352 02/11/23 0643  WBC 4.0 3.5* 4.2  --  4.8  HGB 14.3 14.6 15.2 14.6 13.0  HCT 41.8 43.7 45.1 43.0 37.4*  PLT 193 192 177  --  158    COAGS: Recent Labs    11/15/22 1300 02/10/23 1346  INR 1.0 1.1  APTT 31 31    BMP: Recent Labs    06/12/22 1001 11/05/22 1146 11/15/22 1300 02/10/23 1346 02/10/23 1352 02/11/23 0643  NA  140  --  141 137 139 136  K 4.8  --  4.3 3.9 3.9 3.8  CL 105  --  105 103 104 107  CO2 27  --  27 23  --  22  GLUCOSE 183*  --  110* 139* 138* 125*  BUN 20  --  29* 21 23 11   CALCIUM 9.9  --  9.4 9.3  --  8.3*  CREATININE 0.79   < > 0.96 0.85 0.80 0.76  GFRNONAA  --   --  >60 >60  --  >60   < > = values in this interval not displayed.    LIVER FUNCTION TESTS: Recent Labs    06/12/22 1001 11/15/22 1300 02/10/23 1346 02/11/23 0643  BILITOT 0.5 0.5 0.6 0.7  AST 14 17 16 17   ALT 19 20 21 21   ALKPHOS  --  63 58 51  PROT 7.1 7.1 6.6 5.6*  ALBUMIN  --  4.7 4.3 3.4*     Assessment and Plan:  Occluded right posterior cerebral artery distal P1/P2 junction.   Status post complete revascularization of occluded right posterior cerebral artery distal P1 P2 junction with 1 pass with contact aspiration using an 054 zoom aspiration catheter withTICI 3 revascularization by Dr. Estanislado Pandy.  Care per Neurology.  Ok to be OOB.  Remove groin dressing tomorrow.  Electronically Signed: Murrell Redden, PA-C 02/11/2023, 2:34 PM    I spent a total of 15 Minutes at the the patient's bedside AND on the patient's hospital floor or unit, greater than 50% of which was counseling/coordinating care for f/u cerebral intervention.

## 2023-02-11 NOTE — Progress Notes (Signed)
OT Cancellation Note  Patient Details Name: Ralph Simpson MRN: [id number] DOB: November 03, 1961   Cancelled Treatment:    Reason Eval/Treat Not Completed: Active bedrest order (Will continue to follow patient and evaluate when bedrest order has been discontinued.)  Ailene Ravel, OTR/L,CBIS  Supplemental OT - [facility] Secure Chat Preferred   02/11/2023, 7:56 AM

## 2023-02-12 ENCOUNTER — Other Ambulatory Visit: Payer: Self-pay | Admitting: Physician Assistant

## 2023-02-12 DIAGNOSIS — I639 Cerebral infarction, unspecified: Secondary | ICD-10-CM

## 2023-02-12 DIAGNOSIS — I4891 Unspecified atrial fibrillation: Secondary | ICD-10-CM

## 2023-02-12 MED ORDER — ASPIRIN 81 MG PO CHEW: 81.0000 mg | CHEWABLE_TABLET | Freq: Every day | ORAL | 0 refills | Status: DC

## 2023-02-12 MED ORDER — ENOXAPARIN SODIUM 40 MG/0.4ML IJ SOSY: 40.0000 mg | PREFILLED_SYRINGE | INTRAMUSCULAR | Status: DC

## 2023-02-12 MED ORDER — CLOPIDOGREL BISULFATE 75 MG PO TABS: 75.0000 mg | ORAL_TABLET | Freq: Every day | ORAL | 0 refills | Status: DC

## 2023-02-12 MED ORDER — ASPIRIN 81 MG PO CHEW: 81.0000 mg | CHEWABLE_TABLET | Freq: Every day | ORAL | 0 refills | Status: AC

## 2023-02-12 MED ORDER — ATORVASTATIN CALCIUM 40 MG PO TABS: 40.0000 mg | ORAL_TABLET | Freq: Every day | ORAL | 0 refills | Status: DC

## 2023-02-12 NOTE — TOC Transition Note (Signed)
Transition of Care [facility]) - CM/SW Discharge Note   Patient Details  Name: Ralph Simpson MRN: [id number] Date of Birth: 19-Jul-1961  Transition of Care [facility]) CM/SW Contact:  Ella Bodo, RN Phone Number: 02/12/2023, 3:47 PM   Clinical Narrative:    62 y.o. male presents to [facility] hospital on 02/10/2023 with acute onset HA, and L sided weakness/numbness. Imaging identifies R PCA embolic infarct. Pt underwent thrombectomy of R PCA.  PTA, pt fairly independent and living alone; he ambulates with a cane.  PT recommending [place] follow up, and patient agreeable to services.  Referral to [facility] for continued PT at home.  No DME needs, per pt/therapy.    Final next level of care: Home w Home Health Services Barriers to Discharge: Barriers Resolved   Patient Goals and CMS Choice CMS Medicare.gov Compare Post Acute Care list provided to:: Patient Choice offered to / list presented to : Patient                          Discharge Plan and Services Additional resources added to the After Visit Summary for     Discharge Planning Services: CM Consult Post Acute Care Choice: Home Health                    HH Arranged: PT [place]: [place] ([place]) Date [facility] Agency Contacted: 02/12/23 Time [place]: [id number] Representative spoke with at [place]: [place] ([place]) Interventions [place]: Patient Declined (02/10/2023)  Housing: Low Risk  (02/10/2023)  Transportation Needs: No Transportation Needs (02/10/2023)  Utilities: Not At Risk (02/10/2023)  Alcohol Screen: Low Risk  (07/25/2022)  Depression (PHQ2-9): Low Risk  (07/25/2022)  Recent Concern: Depression (PHQ2-9) - Medium Risk (06/12/2022)  Financial Resource Strain: Low Risk  (07/25/2022)  Physical Activity: Inactive (07/25/2022)  Social Connections: Socially Isolated (07/25/2022)  Stress: No Stress Concern Present (07/25/2022)  Tobacco Use:  Medium Risk (02/12/2023)     Readmission Risk Interventions     No data to display         Reinaldo Raddle, RN, BSN  Trauma/Neuro ICU Case Manager [phone]

## 2023-02-12 NOTE — Progress Notes (Signed)
   02/12/23 1554  Vitals  Temp 97.6 F (36.4 C)  Temp Source Oral  BP 126/71  MAP (mmHg) 87  Pulse Rate 73  ECG Heart Rate 74  Resp 18  Oxygen Therapy  SpO2 95 %  O2 Device Room Air  MEWS Score  MEWS Temp 0  MEWS Systolic 0  MEWS Pulse 0  MEWS RR 0  MEWS LOC 0  MEWS Score 0  MEWS Score Color Green   AVS went over with patient and patient's significant other. All questions answered. See last set of vitals above. Both IV's removed and patient wheeled downstairs.  Montez Hageman RN

## 2023-02-12 NOTE — Progress Notes (Signed)
Dr. Percival Spanish to read 30 day event monitor post CVA

## 2023-02-12 NOTE — Progress Notes (Signed)
Physical Therapy Treatment Patient Details Name: Ralph Simpson MRN: [id number] DOB: [birth year] Today's Date: 02/12/2023   History of Present Illness 62 y.o. male presents to [facility] hospital on 02/10/2023 with acute onset HA, and L sided weakness/numbness. Imaging identifies R PCA embolic infarct. Pt underwent thrombectomy of R PCA. PMH includes migraines, complex partial seizures, GERD, OA.    PT Comments    The pt continues to demo good progress with mobility, and is independent with bed mobility and sit-stand transfers. However, he does demo mild instability with hallway ambulation and balance challenge such as head turns and navigating around obstacles. The pt was previously independent with use of his cane and is hopeful to return to independence with tasks such as feeding his dogs (involves walking on uneven ground and lifting/moving 50lb bag of dog food). Given minor LOB in session with balance challenge, we discussed how to modify this activity safely in the short term, but I feel HHPT to practice these movements and balance in his normal environment would be beneficial.    Recommendations for follow up therapy are one component of a multi-disciplinary discharge planning process, led by the attending physician.  Recommendations may be updated based on patient status, additional functional criteria and insurance authorization.  Follow Up Recommendations       Assistance Recommended at Discharge PRN  Patient can return home with the following Assist for transportation;Assistance with cooking/housework   Equipment Recommendations  None recommended by PT    Recommendations for Other Services       Precautions / Restrictions Precautions Precautions: Fall Precaution Comments: hx of seizures Restrictions Weight Bearing Restrictions: No Other Position/Activity Restrictions: LUE restrictions 2/2 radial cath     Mobility  Bed Mobility Overal bed mobility: Independent                   Transfers Overall transfer level: Modified independent Equipment used: Straight cane               General transfer comment: mild instability with initial stand, but able to complete without LOB    Ambulation/Gait Ambulation/Gait assistance: Supervision, Min guard Gait Distance (Feet): 300 Feet Assistive device: Straight cane Gait Pattern/deviations: Step-through pattern, Decreased stride length, Narrow base of support Gait velocity: pt reports he is at his baseline, 0.31 m/s Gait velocity interpretation: <1.31 ft/sec, indicative of household ambulator   General Gait Details: lateral drift at times and scissoring steps with head movements during gait. pt needing minA to balance. reports gait speed as baseline   Stairs Stairs: Yes Stairs assistance: Min guard Stair Management: No rails, With cane, Alternating pattern, Forwards Number of Stairs: 12 General stair comments: alternating ascending, step-to descending with minA due to LOB on one step   Wheelchair Mobility    Modified Rankin (Stroke Patients Only) Modified Rankin (Stroke Patients Only) Pre-Morbid Rankin Score: Slight disability Modified Rankin: Moderate disability     Balance Overall balance assessment: Needs assistance Sitting-balance support: No upper extremity supported, Feet supported Sitting balance-Leahy Scale: Good     Standing balance support: No upper extremity supported, During functional activity Standing balance-Leahy Scale: Good                   Standardized Balance Assessment Standardized Balance Assessment : Dynamic Gait Index   Dynamic Gait Index Level Surface: Mild Impairment Change in Gait Speed: Mild Impairment Gait with Horizontal Head Turns: Moderate Impairment Gait with Vertical Head Turns: Mild Impairment Gait and Pivot Turn: Mild Impairment Step  Over Obstacle: Mild Impairment Step Around Obstacles: Normal Steps: Mild Impairment Total Score: 16       Cognition Arousal/Alertness: Awake/alert Behavior During Therapy: WFL for tasks assessed/performed Overall Cognitive Status: Within Functional Limits for tasks assessed                                                 Pertinent Vitals/Pain Pain Assessment Pain Assessment: No/denies pain     PT Goals (current goals can now be found in the care plan section) Acute Rehab PT Goals Patient Stated Goal: to return to independence PT Goal Formulation: With patient Time For Goal Achievement: 02/25/23 Potential to Achieve Goals: Good Progress towards PT goals: Progressing toward goals    Frequency    Min 2X/week      PT Plan Discharge plan needs to be updated       AM-PAC PT "6 Clicks" Mobility   Outcome Measure  Help needed turning from your back to your side while in a flat bed without using bedrails?: None Help needed moving from lying on your back to sitting on the side of a flat bed without using bedrails?: None Help needed moving to and from a bed to a chair (including a wheelchair)?: None Help needed standing up from a chair using your arms (e.g., wheelchair or bedside chair)?: None Help needed to walk in hospital room?: A Little Help needed climbing 3-5 steps with a railing? : A Little 6 Click Score: 22    End of Session Equipment Utilized During Treatment: Gait belt Activity Tolerance: Patient tolerated treatment well Patient left: in chair;with call bell/phone within reach;with chair alarm set Nurse Communication: Mobility status PT Visit Diagnosis: Other abnormalities of gait and mobility (R26.89);Other symptoms and signs involving the nervous system (R29.898)     Time: [id number] PT Time Calculation (min) (ACUTE ONLY): 44 min  Charges:  $Gait Training: 23-37 mins $Therapeutic Activity: 8-22 mins                     Ralph Simpson, PT, DPT   Acute Rehabilitation Department Office [phone] Secure Chat Communication Preferred   Ralph Simpson 02/12/2023, 10:38 AM

## 2023-02-12 NOTE — Discharge Summary (Addendum)
Stroke Discharge Summary  Patient ID: Ralph Simpson   MRN: [id number]      DOB: 05-21-1961  Date of Admission: 02/10/2023 Date of Discharge: 02/12/2023  Attending Physician:  Stroke, Md, MD Patient's PCP:  Jearld Fenton, NP  DISCHARGE DIAGNOSIS: Acute Ischemic Strokes Scattered acute ischemic infarcts in right PCA and bilateral cerebellum S/P mechanical thrombectomy for right P2 occlusion with successful TICI 3 revascularization.  Principal Problem:   Acute ischemic stroke [facility])  Active Problems:   Posterior cerebral artery embolism, right Hyperlipidemia Borderline diabetes Seizure disorder Abnormal brain MRI   Allergies as of 02/12/2023       Reactions   Aspirin    Causes brain bleed        Medication List     TAKE these medications    aspirin 81 MG chewable tablet Chew 1 tablet (81 mg total) by mouth daily. Start taking on: February 13, 2023   atorvastatin 40 MG tablet Commonly known as: LIPITOR Take 1 tablet (40 mg total) by mouth daily. Start taking on: February 13, 2023   clopidogrel 75 MG tablet Commonly known as: PLAVIX Take 1 tablet (75 mg total) by mouth daily. Start taking on: February 13, 2023   cyclobenzaprine 10 MG tablet Commonly known as: FLEXERIL Take 1 tablet (10 mg total) by mouth at bedtime as needed for muscle spasms.   lamoTRIgine 100 MG tablet Commonly known as: LAMICTAL Take 2 tablets (200 mg total) by mouth 2 (two) times daily.   levETIRAcetam 500 MG tablet Commonly known as: Keppra Take 1 tablet (500 mg total) by mouth 2 (two) times daily.   Nurtec 75 MG Tbdp Generic drug: Rimegepant Sulfate Take 1 tab at onset of migraine.  May repeat in 2 hrs, if needed.  Max dose: 2 tabs/day. This is a 30 day prescription. What changed:  how much to take how to take this when to take this reasons to take this additional instructions   OneTouch Delica Lancets 99991111 Misc Use to check blood sugar once a day.  DX: E11.9   OneTouch Verio  w/Device Kit Used to check blood sugar once a day. DX E11.9   pantoprazole 40 MG tablet Commonly known as: PROTONIX Take 40 mg by mouth daily as needed (For acid reflux).   RELION GLUCOSE TEST STRIPS test strip Generic drug: glucose blood Use as instructed   ReliOn Lancing Device Misc Use to check blood sugar one a day.  DX: E11.9        LABORATORY STUDIES CBC    Component Value Date/Time   WBC 4.8 02/11/2023 0643   RBC 4.43 02/11/2023 0643   HGB 13.0 02/11/2023 0643   HGB 14.8 04/28/2022 1559   HCT 37.4 (L) 02/11/2023 0643   HCT 43.0 04/28/2022 1559   PLT 158 02/11/2023 0643   PLT 211 04/28/2022 1559   MCV 84.4 02/11/2023 0643   MCV 86 04/28/2022 1559   MCH 29.3 02/11/2023 0643   MCHC 34.8 02/11/2023 0643   RDW 13.2 02/11/2023 0643   RDW 12.6 04/28/2022 1559   LYMPHSABS 1.1 02/11/2023 0643   LYMPHSABS 1.1 04/28/2022 1559   MONOABS 0.6 02/11/2023 0643   EOSABS 0.0 02/11/2023 0643   EOSABS 0.1 04/28/2022 1559   BASOSABS 0.0 02/11/2023 0643   BASOSABS 0.0 04/28/2022 1559   CMP    Component Value Date/Time   NA 136 02/11/2023 0643   NA 141 07/16/2021 0813   K 3.8 02/11/2023 0643   CL  107 02/11/2023 0643   CO2 22 02/11/2023 0643   GLUCOSE 125 (H) 02/11/2023 0643   BUN 11 02/11/2023 0643   BUN 20 07/16/2021 0813   CREATININE 0.76 02/11/2023 0643   CREATININE 0.79 06/12/2022 1001   CALCIUM 8.3 (L) 02/11/2023 0643   PROT 5.6 (L) 02/11/2023 0643   PROT 7.1 07/16/2021 0813   ALBUMIN 3.4 (L) 02/11/2023 0643   ALBUMIN 4.7 07/16/2021 0813   AST 17 02/11/2023 0643   ALT 21 02/11/2023 0643   ALKPHOS 51 02/11/2023 0643   BILITOT 0.7 02/11/2023 0643   BILITOT 0.3 07/16/2021 0813   GFRNONAA >60 02/11/2023 0643   GFRAA >60 11/11/2015 1340   COAGS Lab Results  Component Value Date   INR 1.1 02/10/2023   INR 1.0 11/15/2022   INR 1.0 04/06/2021   Lipid Panel    Component Value Date/Time   CHOL 199 02/11/2023 0643   TRIG 81 02/11/2023 0643   HDL 39 (L)  02/11/2023 0643   CHOLHDL 5.1 02/11/2023 0643   VLDL 16 02/11/2023 0643   LDLCALC 144 (H) 02/11/2023 0643   LDLCALC 112 (H) 06/12/2022 1001   HgbA1C  Lab Results  Component Value Date   HGBA1C 6.5 (H) 02/11/2023   Urinalysis    Component Value Date/Time   COLORURINE YELLOW 02/10/2023 1625   APPEARANCEUR HAZY (A) 02/10/2023 1625   LABSPEC >1.046 (H) 02/10/2023 1625   PHURINE 5.0 02/10/2023 1625   GLUCOSEU NEGATIVE 02/10/2023 1625   HGBUR MODERATE (A) 02/10/2023 1625   BILIRUBINUR NEGATIVE 02/10/2023 1625   KETONESUR 20 (A) 02/10/2023 1625   PROTEINUR NEGATIVE 02/10/2023 1625   NITRITE NEGATIVE 02/10/2023 1625   LEUKOCYTESUR NEGATIVE 02/10/2023 1625   Urine Drug Screen     Component Value Date/Time   LABOPIA NONE DETECTED 02/10/2023 1625   COCAINSCRNUR NONE DETECTED 02/10/2023 1625   COCAINSCRNUR NONE DETECTED 11/15/2022 1256   LABBENZ NONE DETECTED 02/10/2023 1625   AMPHETMU NONE DETECTED 02/10/2023 1625   THCU NONE DETECTED 02/10/2023 1625   LABBARB NONE DETECTED 02/10/2023 1625    Alcohol Level    Component Value Date/Time   ETH <10 02/10/2023 1346     SIGNIFICANT DIAGNOSTIC STUDIES IR PERCUTANEOUS ART THROMBECTOMY/INFUSION INTRACRANIAL INC DIAG ANGIO  Result Date: 02/12/2023 INDICATION: Acute onset of left arm and leg numbness, weakness and paresthesias. CTA of the brain demonstrating occluded right posterior cerebral artery in the P1 P2 junction. EXAM: 1. EMERGENT LARGE VESSEL OCCLUSION THROMBOLYSIS (POSTERIOR CIRCULATION) COMPARISON:  CTA of the head and neck of February 10, 2023. MEDICATIONS: Ancef 2 g IV antibiotic was administered within 1 hour of the procedure. ANESTHESIA/SEDATION: General anesthesia. CONTRAST:  Omnipaque 300 approximately 80 mL. FLUOROSCOPY TIME:  Fluoroscopy Time: 24 minutes 12 seconds (1199 mGy). COMPLICATIONS: None immediate. TECHNIQUE: Following a full explanation of the procedure along with the potential associated complications, an informed  witnessed consent was obtained. The risks of intracranial hemorrhage of 10%, worsening neurological deficit, ventilator dependency, death and inability to revascularize were all reviewed in detail with the patient. Consent was obtained from the patient. The patient was then put under general anesthesia by the Department of Anesthesiology at [facility]. The right groin was prepped and draped in the usual sterile fashion. Thereafter using modified Seldinger technique, transfemoral access into the right common femoral artery was obtained without difficulty. Over an 0.035 inch guidewire a 9 French 45 cm Arrow sheath was inserted. Through this, and also over an 0.035 inch guidewire a 5 [place] JB 1 catheter  was advanced to the aortic arch region and selectively positioned in the left subclavian artery. FINDINGS: The left vertebral artery origin demonstrates wide patency. The vessel has mild tortuosity in its proximal 1/3. More distally, the vessel is seen to opacify to the cranial skull base. Wide patency is seen of the left vertebrobasilar junction and the left posterior-inferior cerebellar artery. Wide patency is seen of the basilar artery, the left posterior cerebral artery, the superior cerebellar arteries and the anterior-inferior cerebellar arteries into the capillary and venous phases. Occlusion of the right posterior cerebral in the distal P1 segment is noted. PROCEDURE: Over an 035 inch 300 cm Roadrunner guidewire, an 8 French 90 cm Neuron Max sheath was advanced and positioned at the origin of the left vertebral artery. The guidewire was removed. Good aspiration was obtained from the hub of the Neuron Max sheath. A gentle control arteriogram demonstrated no evidence of spasms, dissections or of intraluminal filling defects. Over an 035 inch Roadrunner guidewire, the Neuron Max sheath was advanced to the distal left vertebral artery. The guidewire was removed. Good aspiration obtained from the hub of the  Neuron Max sheath. A combination of an 054 132 cm Zoom aspiration catheter with a 160 cm 021 microcatheter was advanced over an 018 inch micro guidewire with a moderate J configuration to the distal basilar artery. The guidewire was then gently manipulated through the occluded right posterior cerebral artery distal P1 segment into the distal P3 region followed by the advancement of the Zoom aspiration catheter to engage the occlusion at the P1 P2 junction. The guidewire, and the microcatheter were removed. Constant aspiration was then applied at the hub of the Zoom aspiration catheter for approximately 2 minutes with a Penumbra pump. With proximal aspiration through the Neuron Max sheath with a 20 mL syringe, the Zoom aspiration catheter was removed. Control arteriogram performed through the Neuron Max sheath in the distal left vertebral artery now demonstrated complete revascularization of the right posterior cerebral artery achieving a TICI 3 revascularization. Neuron Max sheath was retrieved to just its origin. A final control arteriogram performed through this demonstrated patency of the left vertebral artery extra cranially and intracranially. The right posterior cerebral artery distribution continued to demonstrate a TICI 3 revascularization. Patency of the left posterior cerebral arteries, the superior cerebellar arteries and the anterior-inferior cerebellar arteries was intact. The Neuron Max sheath was removed. A 5 [place] JB 1 catheter was then advanced over an 035 inch Roadrunner guidewire and positioned in the right non dominant vertebral artery. A control arteriogram performed through this demonstrated patency of the right vertebral artery to the cranial skull base including the right vertebrobasilar junction and the right posterior-inferior cerebellar artery. Complete patency of the right posterior cerebral artery distribution was noted. Unopacified blood was noted in the basilar artery, with patency  maintained of the superior cerebellar arteries and the anterior-inferior cerebellar arteries and the left posterior cerebral artery distribution. The 9 French Arrow sheath was removed with hemostasis at the right groin puncture site with an 8 [place] Angio-Seal closure device. Distal pulses were present bilaterally unchanged from prior to the procedure. A flat panel CT of the brain demonstrated no evidence of hemorrhagic complications. Patient was extubated. Upon recovery, the patient was responsive appropriately and moving all fours equally, and following simple commands appropriately. The patient was then transferred to the neuro ICU for post revascularization care. IMPRESSION: Status post endovascular complete revascularization of occluded right posterior cerebral artery distal P1 P2 junction with 1 pass  with an 054 Zoom aspiration catheter achieving a TICI 3 revascularization. PLAN: Follow-up in the clinic 4 weeks post discharge. Electronically Signed   By: Luanne Bras M.D.   On: 02/12/2023 09:07   IR ANGIO VERTEBRAL SEL VERTEBRAL UNI R MOD SED  Result Date: 02/12/2023 INDICATION: Acute onset of left arm and leg numbness, weakness and paresthesias. CTA of the brain demonstrating occluded right posterior cerebral artery in the P1 P2 junction. EXAM: 1. EMERGENT LARGE VESSEL OCCLUSION THROMBOLYSIS (POSTERIOR CIRCULATION) COMPARISON:  CTA of the head and neck of February 10, 2023. MEDICATIONS: Ancef 2 g IV antibiotic was administered within 1 hour of the procedure. ANESTHESIA/SEDATION: General anesthesia. CONTRAST:  Omnipaque 300 approximately 80 mL. FLUOROSCOPY TIME:  Fluoroscopy Time: 24 minutes 12 seconds (1199 mGy). COMPLICATIONS: None immediate. TECHNIQUE: Following a full explanation of the procedure along with the potential associated complications, an informed witnessed consent was obtained. The risks of intracranial hemorrhage of 10%, worsening neurological deficit, ventilator dependency, death and  inability to revascularize were all reviewed in detail with the patient. Consent was obtained from the patient. The patient was then put under general anesthesia by the Department of Anesthesiology at [facility]. The right groin was prepped and draped in the usual sterile fashion. Thereafter using modified Seldinger technique, transfemoral access into the right common femoral artery was obtained without difficulty. Over an 0.035 inch guidewire a 9 French 45 cm Arrow sheath was inserted. Through this, and also over an 0.035 inch guidewire a 5 [place] JB 1 catheter was advanced to the aortic arch region and selectively positioned in the left subclavian artery. FINDINGS: The left vertebral artery origin demonstrates wide patency. The vessel has mild tortuosity in its proximal 1/3. More distally, the vessel is seen to opacify to the cranial skull base. Wide patency is seen of the left vertebrobasilar junction and the left posterior-inferior cerebellar artery. Wide patency is seen of the basilar artery, the left posterior cerebral artery, the superior cerebellar arteries and the anterior-inferior cerebellar arteries into the capillary and venous phases. Occlusion of the right posterior cerebral in the distal P1 segment is noted. PROCEDURE: Over an 035 inch 300 cm Roadrunner guidewire, an 8 French 90 cm Neuron Max sheath was advanced and positioned at the origin of the left vertebral artery. The guidewire was removed. Good aspiration was obtained from the hub of the Neuron Max sheath. A gentle control arteriogram demonstrated no evidence of spasms, dissections or of intraluminal filling defects. Over an 035 inch Roadrunner guidewire, the Neuron Max sheath was advanced to the distal left vertebral artery. The guidewire was removed. Good aspiration obtained from the hub of the Neuron Max sheath. A combination of an 054 132 cm Zoom aspiration catheter with a 160 cm 021 microcatheter was advanced over an 018 inch  micro guidewire with a moderate J configuration to the distal basilar artery. The guidewire was then gently manipulated through the occluded right posterior cerebral artery distal P1 segment into the distal P3 region followed by the advancement of the Zoom aspiration catheter to engage the occlusion at the P1 P2 junction. The guidewire, and the microcatheter were removed. Constant aspiration was then applied at the hub of the Zoom aspiration catheter for approximately 2 minutes with a Penumbra pump. With proximal aspiration through the Neuron Max sheath with a 20 mL syringe, the Zoom aspiration catheter was removed. Control arteriogram performed through the Neuron Max sheath in the distal left vertebral artery now demonstrated complete revascularization of the right posterior  cerebral artery achieving a TICI 3 revascularization. Neuron Max sheath was retrieved to just its origin. A final control arteriogram performed through this demonstrated patency of the left vertebral artery extra cranially and intracranially. The right posterior cerebral artery distribution continued to demonstrate a TICI 3 revascularization. Patency of the left posterior cerebral arteries, the superior cerebellar arteries and the anterior-inferior cerebellar arteries was intact. The Neuron Max sheath was removed. A 5 [place] JB 1 catheter was then advanced over an 035 inch Roadrunner guidewire and positioned in the right non dominant vertebral artery. A control arteriogram performed through this demonstrated patency of the right vertebral artery to the cranial skull base including the right vertebrobasilar junction and the right posterior-inferior cerebellar artery. Complete patency of the right posterior cerebral artery distribution was noted. Unopacified blood was noted in the basilar artery, with patency maintained of the superior cerebellar arteries and the anterior-inferior cerebellar arteries and the left posterior cerebral artery  distribution. The 9 French Arrow sheath was removed with hemostasis at the right groin puncture site with an 8 [place] Angio-Seal closure device. Distal pulses were present bilaterally unchanged from prior to the procedure. A flat panel CT of the brain demonstrated no evidence of hemorrhagic complications. Patient was extubated. Upon recovery, the patient was responsive appropriately and moving all fours equally, and following simple commands appropriately. The patient was then transferred to the neuro ICU for post revascularization care. IMPRESSION: Status post endovascular complete revascularization of occluded right posterior cerebral artery distal P1 P2 junction with 1 pass with an 054 Zoom aspiration catheter achieving a TICI 3 revascularization. PLAN: Follow-up in the clinic 4 weeks post discharge. Electronically Signed   By: Luanne Bras M.D.   On: 02/12/2023 09:07   ECHOCARDIOGRAM COMPLETE  Result Date: 02/11/2023    ECHOCARDIOGRAM REPORT   Patient Name:   AVRIAN BALILES Date of Exam: 02/11/2023 Medical Rec #:  [id number]      Height:       75.0 in Accession #:    [id number]     Weight:       187.4 lb Date of Birth:  11/09/1961      BSA:          2.134 m Patient Age:    62 years       BP:           101/65 mmHg Patient Gender: M              HR:           83 bpm. Exam Location:  Inpatient Procedure: 2D Echo, Cardiac Doppler and Color Doppler Indications:    Stroke I63.9  History:        Patient has no prior history of Echocardiogram examinations.                 Stroke; Risk Factors:Diabetes and Dyslipidemia. Chronic                 Migraine.  Sonographer:    Ronny Flurry Referring Phys: [id number] Kiana  1. Left ventricular ejection fraction, by estimation, is 55 to 60%. The left ventricle has normal function. The left ventricle has no regional wall motion abnormalities. Left ventricular diastolic parameters are indeterminate.  2. Right ventricular systolic function is normal.  The right ventricular size is normal.  3. The mitral valve is normal in structure. No evidence of mitral valve regurgitation. No evidence of mitral stenosis.  4. The aortic valve is normal  in structure. Aortic valve regurgitation is not visualized. No aortic stenosis is present.  5. The inferior vena cava is normal in size with greater than 50% respiratory variability, suggesting right atrial pressure of 3 mmHg. FINDINGS  Left Ventricle: Left ventricular ejection fraction, by estimation, is 55 to 60%. The left ventricle has normal function. The left ventricle has no regional wall motion abnormalities. The left ventricular internal cavity size was normal in size. There is  no left ventricular hypertrophy. Left ventricular diastolic parameters are indeterminate. Right Ventricle: The right ventricular size is normal. No increase in right ventricular wall thickness. Right ventricular systolic function is normal. Left Atrium: Left atrial size was normal in size. Right Atrium: Right atrial size was normal in size. Pericardium: There is no evidence of pericardial effusion. Mitral Valve: The mitral valve is normal in structure. No evidence of mitral valve regurgitation. No evidence of mitral valve stenosis. Tricuspid Valve: The tricuspid valve is normal in structure. Tricuspid valve regurgitation is not demonstrated. No evidence of tricuspid stenosis. Aortic Valve: The aortic valve is normal in structure. Aortic valve regurgitation is not visualized. No aortic stenosis is present. Aortic valve mean gradient measures 4.0 mmHg. Aortic valve peak gradient measures 7.7 mmHg. Aortic valve area, by VTI measures 3.00 cm. Pulmonic Valve: The pulmonic valve was normal in structure. Pulmonic valve regurgitation is not visualized. No evidence of pulmonic stenosis. Aorta: The aortic root is normal in size and structure. Venous: The inferior vena cava is normal in size with greater than 50% respiratory variability, suggesting right  atrial pressure of 3 mmHg. IAS/Shunts: No atrial level shunt detected by color flow Doppler.  LEFT VENTRICLE PLAX 2D LVIDd:         5.00 cm   Diastology LVIDs:         3.40 cm   LV e' medial:    8.59 cm/s LV PW:         0.90 cm   LV E/e' medial:  7.1 LV IVS:        0.90 cm   LV e' lateral:   11.40 cm/s LVOT diam:     2.20 cm   LV E/e' lateral: 5.4 LV SV:         82 LV SV Index:   38 LVOT Area:     3.80 cm  RIGHT VENTRICLE             IVC RV S prime:     10.30 cm/s  IVC diam: 1.60 cm TAPSE (M-mode): 1.6 cm LEFT ATRIUM             Index        RIGHT ATRIUM           Index LA diam:        3.40 cm 1.59 cm/m   RA Area:     18.65 cm LA Vol (A2C):   51.2 ml 23.99 ml/m  RA Volume:   51.50 ml  24.13 ml/m LA Vol (A4C):   43.9 ml 20.57 ml/m LA Biplane Vol: 50.0 ml 23.42 ml/m  AORTIC VALVE AV Area (Vmax):    3.24 cm AV Area (Vmean):   3.19 cm AV Area (VTI):     3.00 cm AV Vmax:           139.00 cm/s AV Vmean:          93.000 cm/s AV VTI:            0.273 m AV Peak Grad:  7.7 mmHg AV Mean Grad:      4.0 mmHg LVOT Vmax:         118.33 cm/s LVOT Vmean:        78.067 cm/s LVOT VTI:          0.215 m LVOT/AV VTI ratio: 0.79  AORTA Ao Root diam: 3.90 cm Ao Asc diam:  3.10 cm MITRAL VALVE MV Area (PHT): 3.85 cm    SHUNTS MV Decel Time: 197 msec    Systemic VTI:  0.22 m MV E velocity: 61.10 cm/s  Systemic Diam: 2.20 cm MV A velocity: 97.00 cm/s MV E/A ratio:  0.63 Kardie Tobb DO Electronically signed by Berniece Salines DO Signature Date/Time: 02/11/2023/2:24:37 PM    Final    MR BRAIN WO CONTRAST  Result Date: 02/11/2023 CLINICAL DATA:  Stroke follow-up EXAM: MRI HEAD WITHOUT CONTRAST TECHNIQUE: Multiplanar, multiecho pulse sequences of the brain and surrounding structures were obtained without intravenous contrast. COMPARISON:  Head CT and CTA from yesterday FINDINGS: Brain: Scattered acute infarcts in the posterior circulation including at the right hippocampus, lateral right thalamus, right occipital cortex, and  bilateral cerebellum. The largest area of infarction is at the hippocampus. FLAIR hyperintensity in the cerebral white matter, likely chronic small vessel ischemia. Chronic FLAIR hyperintensity at left precentral sulcus which has been seen on prior imaging to enhance, favoring remote inflammation with scarring given continued stability. No hydrocephalus or masslike finding. Vascular: Grossly preserved arterial flow voids.  Preceding CTA. Skull and upper cervical spine: Negative Sinuses/Orbits: Left cataract resection.  No acute finding IMPRESSION: 1. Scattered acute infarcts in the right PCA distribution and in the bilateral cerebellum as described. 2. Stable sulcal signal abnormality at the superior left frontal lobe. Electronically Signed   By: Jorje Guild M.D.   On: 02/11/2023 06:14   CT ANGIO HEAD NECK W WO CM (CODE STROKE)  Result Date: 02/10/2023 CLINICAL DATA:  Left-sided weakness and numbness EXAM: CT ANGIOGRAPHY HEAD AND NECK TECHNIQUE: Multidetector CT imaging of the head and neck was performed using the standard protocol during bolus administration of intravenous contrast. Multiplanar CT image reconstructions and MIPs were obtained to evaluate the vascular anatomy. Carotid stenosis measurements (when applicable) are obtained utilizing NASCET criteria, using the distal internal carotid diameter as the denominator. RADIATION DOSE REDUCTION: This exam was performed according to the departmental dose-optimization program which includes automated exposure control, adjustment of the mA and/or kV according to patient size and/or use of iterative reconstruction technique. CONTRAST:  20mL OMNIPAQUE IOHEXOL 350 MG/ML SOLN COMPARISON:  CTA 04/06/2021, correlation is also made with CT head 02/10/2023 and MRI head 12/02/2022 FINDINGS: CT HEAD FINDINGS For noncontrast findings, please see same day CT head. CTA NECK FINDINGS Aortic arch: Standard branching. Imaged portion shows no evidence of aneurysm or  dissection. No significant stenosis of the major arch vessel origins. Right carotid system: No evidence of stenosis, dissection, or occlusion. Left carotid system: No evidence of stenosis, dissection, or occlusion. Vertebral arteries: No evidence of stenosis, dissection, or occlusion. Skeleton: No acute osseous abnormality. Degenerative changes in the cervical spine. Other neck: Negative. Upper chest: No focal pulmonary opacity or pleural effusion. Review of the MIP images confirms the above findings CTA HEAD FINDINGS Anterior circulation: Both internal carotid arteries are patent to the termini, without significant stenosis. A1 segments patent. Normal anterior communicating artery. Anterior cerebral arteries are patent to their distal aspects without significant stenosis. No M1 stenosis or occlusion. MCA branches perfused to their distal aspects without significant stenosis.  Posterior circulation: Vertebral arteries patent to the vertebrobasilar junction without significant stenosis. Posterior inferior cerebellar arteries patent proximally. Basilar patent to its distal aspect without significant stenosis. Superior cerebellar arteries patent proximally. Patent P1 segments. Occlusion of the right P2 (series 7, image 124), with likely distal reconstitution of a P3 or P4 segment (series 7, image 122). The left PCA is perfused to its distal aspect without significant stenosis. The left posterior communicating artery is patent. The right posterior communicating artery is not definitively seen but may be diminutive. Venous sinuses: As permitted by contrast timing, patent. Anatomic variants: None significant. Review of the MIP images confirms the above findings On postcontrast imaging, there is enhancement in left greater than right sulci, no seen on the prior MRI. IMPRESSION: 1. Occlusion of the right P2, with likely distal reconstitution of a P3 or P4 segment. 2. No other intracranial large vessel occlusion or  significant stenosis. 3. No hemodynamically significant stenosis in the neck. 4. Enhancement in left greater than right sulci, as seen on the prior MRI. Attention on subsequent MRI. These findings were discussed by telephone on 02/10/2023 at 2:08 pm with provider ASHISH ARORA . Electronically Signed   By: Merilyn Baba M.D.   On: 02/10/2023 14:23   CT HEAD CODE STROKE WO CONTRAST  Result Date: 02/10/2023 CLINICAL DATA:  Code stroke.  Left-sided weakness and numbness EXAM: CT HEAD WITHOUT CONTRAST TECHNIQUE: Contiguous axial images were obtained from the base of the skull through the vertex without intravenous contrast. RADIATION DOSE REDUCTION: This exam was performed according to the departmental dose-optimization program which includes automated exposure control, adjustment of the mA and/or kV according to patient size and/or use of iterative reconstruction technique. COMPARISON:  11/15/2022 CT head, 12/02/2022 MRI head FINDINGS: Brain: No evidence of acute infarction, hemorrhage, mass, mass effect, or midline shift. No hydrocephalus or extra-axial collection. Redemonstrated ill-defined hyperdensity in the high left frontal lobe, as well as in the right parietal white lobe, which correlates with the areas of cortical an leptomeningeal enhancement seen on the 12/02/2022 MRI. Vascular: No hyperdense vessel. Skull: Negative for fracture or focal lesion. Sinuses/Orbits: No acute finding. Status post left lens replacement. Other: The mastoid air cells are well aerated. ASPECTS [facility] Stroke Program Early CT Score) - Ganglionic level infarction (caudate, lentiform nuclei, internal capsule, insula, M1-M3 cortex): 7 - Supraganglionic infarction (M4-M6 cortex): 3 Total score (0-10 with 10 being normal): 10 IMPRESSION: 1. No acute intracranial process. ASPECTS is 10. 2. Redemonstrated ill-defined hyperdensity in the high left frontal lobe and right parietal lobe, which correlates with the area of cortical an  leptomeningeal enhancement seen on the prior MRIs. These findings were discussed by telephone on 02/10/2023 at 2:05 pm with provider ARORA . Electronically Signed   By: Merilyn Baba M.D.   On: 02/10/2023 14:07      HISTORY OF PRESENT ILLNESS Ralph Simpson is a 62 y.o. 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  S/P mechanical thrombectomy of P2 .    HOSPITAL COURSE Stroke: Scattered acute ischemic infarcts in right PCA and bilateral cerebellum S/P mechanical thrombectomy of right P2 occlusion with successful  TICI 3 revascularization. Etiology: Likely cardioembolic Code Stroke  CT head No acute abnormality. ASPECTS 10.    CTA head & neck right P2 occlusion MRI scattered acute infarcts in right PCA and bilateral cerebellum 2D Echo 55 to 60% LDL 144 HgbA1c 6.5 Recommend 30-day heart monitor after discharge.  As patient is not a good candidate for anticoagulation secondary to his multifocal enhancing lesions of unknown etiology.  Patient is unwilling to consider brain biopsy UDS negative VTE prophylaxis -Lovenox       Diet    Diet Carb Modified Fluid consistency: Thin; Room service appropriate? Yes        No antithrombotic prior to admission, now on aspirin 81 mg and Plavix 75 mg.  For 3 weeks then Plavix alone. Patient has noted aspirin allergy, benefits outweigh risk for 3 week DAPT treatment.  Therapy recommendations: Home with Home Health PT  Hypertension Home meds: None Stable Long-term BP goal normotensive   Hyperlipidemia Home meds: None LDL 144, goal < 70 Add  atorvastatin 40 mg Continue statin at discharge   Seizure disorder Complex migraines Abnormal brain MRI Continue home Keppra and Lamictal And Fioricet if needed for headache management Prior MRIs -stable multiple enhancing lesions mostly leptomeningeal in left frontal region, right frontal parietal and left occipital region and has declined brain biopsy Recommend outpatient surgeon follow-up; discussed with patient.    Other Active Problems GERD-continue home Protonix   DISCHARGE EXAM Blood pressure (!) 89/51, pulse 74, temperature (!) 97.3 F (36.3 C), temperature source Axillary, resp. rate 18, height 6\' 3"  (1.905 m), weight 85 kg, SpO2 95 %.   General - Well nourished, well developed pleasant middle-aged Caucasian male, in no apparent distress. Cardiovascular - Regular rhythm and rate.   Mental Status -  Level of arousal and orientation to time, place, and person were intact. Language including expression, naming, repetition, comprehension was assessed and found intact. Attention span and concentration were normal. Recent and remote memory were intact. Fund of Knowledge was assessed and was intact.   Cranial Nerves II - XII - II - Visual field intact OU. III, IV, VI - Extraocular movements intact. V - Facial sensation intact bilaterally. VII - Facial movement intact bilaterally. VIII - Hearing & vestibular intact bilaterally. X - Palate elevates symmetrically. XI - Chin turning & shoulder shrug intact bilaterally. XII - Tongue protrusion intact.   Motor Strength - The patient's strength was normal in all extremities and pronator drift was absent.  Slight fine motor skills are diminished in the left hand bulk was normal and fasciculations were absent.   Motor Tone - Muscle tone was assessed at the neck and appendages and was normal.   Sensory  intact & symmetrical    Coordination - The patient had normal movements in the hands and feet with no ataxia or dysmetria.   Tremor was absent.   Gait and Station - deferred.    Discharge Diet       Diet   Diet Carb Modified Fluid consistency: Thin; Room service appropriate? Yes   liquids  DISCHARGE PLAN Disposition:  home with home health PT aspirin 81 mg daily and clopidogrel 75 mg daily for secondary stroke prevention for 3 weeks then Plavix alone. Ongoing stroke risk factor control by Primary Care Physician at time of discharge Follow-up PCP Jearld Fenton, NP in 2 weeks, including follow-up on HgbA1c of 6.5. Follow-up in [facility] Dr. Krista Blue in 8 weeks, office to schedule an appointment.  Outpatient referral  to neurosurgery to discuss biopsy of brain lesions  40 minutes were spent preparing discharge.   Pt seen by Neuro NP/APP and later by MD. Note/plan to be edited by MD as needed.    Otelia Santee, DNP, AGACNP-BC [facility] Please use AMION for pager and EPIC for messaging  I have personally obtained history,examined this patient, reviewed notes, independently viewed imaging studies, participated in medical decision making and plan of care.ROS completed by me personally and pertinent positives fully documented  I have made any additions or clarifications directly to the above note. Agree with note above.    Antony Contras, MD Medical Director [facility] Pager: [phone] 02/12/2023 3:35 PM

## 2023-02-13 ENCOUNTER — Telehealth: Payer: Self-pay

## 2023-02-13 NOTE — Transitions of Care (Post Inpatient/ED Visit) (Addendum)
   02/13/2023  Name: Ralph Simpson MRN: [id number] DOB: 1961/08/03  Today's TOC FU Call Status: Today's TOC FU Call Status:: Successful TOC FU Call Competed TOC FU Call Complete Date: 02/13/23  Transition Care Management Follow-up Telephone Call Date of Discharge: 02/12/23 [facility]charge [facility] Type of Discharge: Inpatient Admission Primary Inpatient Discharge Diagnosis:: Acute Ischemic Stroke How have you been since you were released from the hospital?: Same (still feel tired and weak) Any questions or concerns?: No  Items Reviewed: Did you receive and understand the discharge instructions provided?: Yes Medications obtained and verified?: Yes (Medications Reviewed) Any new allergies since your discharge?: No Dietary orders reviewed?: Yes Type of Diet Ordered:: Regular Do you have support at home?: Yes People in Home: friend(s) Name of Support/Comfort Primary Source: [place]- friend  Home Care and Equipment/Supplies: [place] Ordered?: Yes Name of [place]:: [place] set up a time to come to your home?: Yes EMR reviewed for [place] Orders: Orders present/patient has not received call (refer to CM for follow-up) Any new equipment or medical supplies ordered?: No  Functional Questionnaire: Do you need assistance with bathing/showering or dressing?: Yes Do you need assistance with meal preparation?: Yes Do you need assistance with eating?: No Do you have difficulty maintaining continence: No Do you need assistance with getting out of bed/getting out of a chair/moving?: No Do you have difficulty managing or taking your medications?: No  Follow up appointments reviewed: PCP Follow-up appointment confirmed?: Yes Date of PCP follow-up appointment?: 03/02/23 Follow-up Provider: Webb Silversmith, NP Specialist Hospital Follow-up appointment confirmed?: Yes Date of Specialist follow-up appointment?:  03/23/23 Follow-Up Specialty Provider:: Dr. Percival Spanish (cardiology) Do you need transportation to your follow-up appointment?: No Do you understand care options if your condition(s) worsen?: Yes-patient verbalized understanding  SDOH Interventions Today    Flowsheet Row Most Recent Value  SDOH Interventions   Food Insecurity Interventions Intervention Not Indicated  Housing Interventions Intervention Not Indicated     Johnney Killian, RN, BSN, CCM Care Management Coordinator [facility] Phone: [phone]: [phone]

## 2023-02-18 ENCOUNTER — Ambulatory Visit: Payer: Self-pay | Admitting: *Deleted

## 2023-02-18 ENCOUNTER — Encounter: Payer: Self-pay | Admitting: *Deleted

## 2023-02-18 NOTE — Patient Instructions (Addendum)
Visit Information  Thank you for taking time to visit with me today. Please don't hesitate to contact me if I can be of assistance to you before our next scheduled telephone appointment.  Following are the goals we discussed today:  Call insurance company about life alert system.  Monitor blood pressure daily. Follow up appointments are:  1 - Webb Silversmith on 4/15@ 340pm  2 - Nechama Guard, MD    -     5/6 @ 10am Neurology NPI: [id number] [address]  [place] [address]  Phone: [phone]  3 - Dr. Percival Spanish at cardiology office [place] in [place]   on 5/7 @ 320 pm   Our next appointment is by telephone on 5/10  Please call the care guide team at [phone] if you need to cancel or reschedule your appointment.   Please call the Suicide and Crisis Lifeline: 988 call the [place] National Suicide Prevention Lifeline: (207)770-7038 or TTY: 407-810-8201 TTY 251-856-8739) to talk to a trained counselor call 1-800-273-TALK (toll free, 24 hour hotline) call 911 if you are experiencing a Mental Health or [place] or need someone to talk to.  Patient verbalizes understanding of instructions and care plan provided today and agrees to view in [place]. Active MyChart status and patient understanding of how to access instructions and care plan via MyChart confirmed with patient.     The patient has been provided with contact information for the care management team and has been advised to call with any health related questions or concerns.   Valente David, RN, MSN, [place] Care Management Care Management Coordinator [phone]

## 2023-02-18 NOTE — Patient Outreach (Signed)
  Care Coordination   Initial Visit Note   02/18/2023 Name: Ralph Simpson MRN: [id number] DOB: 1961/01/11  Ralph Simpson is a 62 y.o. year old male who sees Baity, Coralie Keens, NP for primary care. I spoke with  Ralph Simpson by phone today.  What matters to the patients health and wellness today?  Patient recently hospitalized with stroke, discharged home on 3/28.  Involved with home heath PT to increase strength.  Denies any urgent concerns, encouraged to contact this care manager with questions.     Goals Addressed             This Visit's Progress    Adequate recovery post stroke       Care Coordination Interventions: Evaluation of current treatment plan related to stroke and patient's adherence to plan as established by provider Advised patient to monitor blood pressure daily and record readings Reviewed medications with patient and discussed affordability and adherence Reviewed scheduled/upcoming provider appointments including PCP on 4/15, Neurology on 5/6, cardiology on 5/7, and another neurology follow upon 7/9 Discussed plans with patient for ongoing care management follow up and provided patient with direct contact information for care management team Screening for signs and symptoms of depression related to chronic disease state  Assessed social determinant of health barriers Advised patient to call insurance company to inquire about life alert system that may be covered         SDOH assessments and interventions completed:  Yes  SDOH Interventions    Flowsheet Row Telephone from 02/13/2023 in [place] from 07/25/2022 in [facility]  SDOH Interventions    Food Insecurity Interventions Intervention Not Indicated Intervention Not Indicated  Housing Interventions Intervention Not Indicated Intervention Not Indicated  Transportation Interventions -- Intervention Not Indicated   Utilities Interventions -- Intervention Not Indicated  Financial Strain Interventions -- Intervention Not Indicated  Physical Activity Interventions -- Patient Refused  Stress Interventions -- Intervention Not Indicated  Social Connections Interventions -- Intervention Not Indicated          Care Coordination Interventions:  Yes, provided   Interventions Today    Flowsheet Row Most Recent Value  Chronic Disease   Chronic disease during today's visit Other  [stroke]  General Interventions   General Interventions Discussed/Reviewed General Interventions Reviewed, Doctor Visits  Doctor Visits Discussed/Reviewed Doctor Visits Reviewed, PCP, Specialist  PCP/Specialist Visits Compliance with follow-up visit  Education Interventions   Education Provided Provided Education  Provided Verbal Education On When to see the doctor, Medication  Pharmacy Interventions   Pharmacy Dicussed/Reviewed Affording Medications, Pharmacy Topics Reviewed  Safety Interventions   Safety Discussed/Reviewed Home Safety, Safety Reviewed, Fall Risk       Follow up plan: Follow up call scheduled for 5/10    Encounter Outcome:  Pt. Visit Completed   Valente David, RN, MSN, [place] Care Management Care Management Coordinator [phone]

## 2023-02-20 ENCOUNTER — Ambulatory Visit: Payer: Medicare Other | Attending: Physician Assistant

## 2023-02-20 DIAGNOSIS — I639 Cerebral infarction, unspecified: Secondary | ICD-10-CM | POA: Diagnosis not present

## 2023-02-20 DIAGNOSIS — I4891 Unspecified atrial fibrillation: Secondary | ICD-10-CM | POA: Diagnosis not present

## 2023-02-27 ENCOUNTER — Ambulatory Visit: Payer: Medicare Other | Admitting: Internal Medicine

## 2023-03-02 ENCOUNTER — Ambulatory Visit (INDEPENDENT_AMBULATORY_CARE_PROVIDER_SITE_OTHER): Payer: Medicare Other | Admitting: Internal Medicine

## 2023-03-02 ENCOUNTER — Encounter: Payer: Self-pay | Admitting: Internal Medicine

## 2023-03-02 VITALS — BP 126/74 | HR 76 | Temp 97.1°F | Wt 190.0 lb

## 2023-03-02 DIAGNOSIS — G40209 Localization-related (focal) (partial) symptomatic epilepsy and epileptic syndromes with complex partial seizures, not intractable, without status epilepticus: Secondary | ICD-10-CM

## 2023-03-02 DIAGNOSIS — M8700 Idiopathic aseptic necrosis of unspecified bone: Secondary | ICD-10-CM | POA: Diagnosis not present

## 2023-03-02 DIAGNOSIS — E1169 Type 2 diabetes mellitus with other specified complication: Secondary | ICD-10-CM

## 2023-03-02 DIAGNOSIS — K219 Gastro-esophageal reflux disease without esophagitis: Secondary | ICD-10-CM | POA: Diagnosis not present

## 2023-03-02 DIAGNOSIS — I700 Atherosclerosis of aorta: Secondary | ICD-10-CM

## 2023-03-02 DIAGNOSIS — M1611 Unilateral primary osteoarthritis, right hip: Secondary | ICD-10-CM

## 2023-03-02 DIAGNOSIS — Z8673 Personal history of transient ischemic attack (TIA), and cerebral infarction without residual deficits: Secondary | ICD-10-CM

## 2023-03-02 DIAGNOSIS — G43709 Chronic migraine without aura, not intractable, without status migrainosus: Secondary | ICD-10-CM | POA: Diagnosis not present

## 2023-03-02 DIAGNOSIS — E1165 Type 2 diabetes mellitus with hyperglycemia: Secondary | ICD-10-CM

## 2023-03-02 DIAGNOSIS — E785 Hyperlipidemia, unspecified: Secondary | ICD-10-CM

## 2023-03-02 NOTE — Assessment & Plan Note (Signed)
Continue Nurtec as needed.

## 2023-03-02 NOTE — Progress Notes (Signed)
Subjective:    Patient ID: Ralph Simpson, male    DOB: [birth year], 61 y.o.   MRN: [id number]  HPI  Patient presents to clinic today for follow-up of chronic conditions.  AVN/OA Bilateral Hips: Status post bone grafting. He does not take any mediations for this. He follows with orthopedics.  GERD: Triggered by meat.  He is not taking Pantoprazole as prescribed.  Upper GI from 07/2019 reviewed.  Migraines: Triggered by weather changes.  These occur rarely.  He takes Nurtec for relief of symptoms.  He follows with neurology.  DM2: His last A1c was 6.5%, 01/2023.  He is not taking any oral diabetic medication at this time.  His sugars range.  His last eye exam was > 1 year ago.  He does not check his feet routinely.  Flu never.  Pneumovax never.  COVID never.  HLD with Aortic Atherosclerosis status post Stroke: His last LDL was 144, triglycerides 81, 01/2023.  He denies myalgias on Atorvastatin.  He is taking Aspirin and Plavix as well.  He tries to consume a low-fat diet.  Seizures: He denies recent seizures.  He is taking Lamotrigine and Keppra as prescribed.  He follows with neurology  Review of Systems  Past Medical History:  Diagnosis Date   Allergy    Avascular necrosis of bones of both hips    GERD (gastroesophageal reflux disease)    Migraine    Neutropenia    Osteoarthritis of both hips    Plantar fasciitis    Seizures    x 2 03/2021   Trochanteric bursitis of right hip     Current Outpatient Medications  Medication Sig Dispense Refill   aspirin 81 MG chewable tablet Chew 1 tablet (81 mg total) by mouth daily. 21 tablet 0   atorvastatin (LIPITOR) 40 MG tablet Take 1 tablet (40 mg total) by mouth daily. 30 tablet 0   Blood Glucose Monitoring Suppl (ONETOUCH VERIO) w/Device KIT Used to check blood sugar once a day. DX E11.9 1 kit 0   clopidogrel (PLAVIX) 75 MG tablet Take 1 tablet (75 mg total) by mouth daily. 30 tablet 0   cyclobenzaprine (FLEXERIL) 10 MG tablet Take 1  tablet (10 mg total) by mouth at bedtime as needed for muscle spasms. 30 tablet 0   glucose blood (RELION GLUCOSE TEST STRIPS) test strip Use as instructed 100 each 1   lamoTRIgine (LAMICTAL) 100 MG tablet Take 2 tablets (200 mg total) by mouth 2 (two) times daily. 120 tablet 11   Lancet Devices (RELION LANCING DEVICE) MISC Use to check blood sugar one a day.  DX: E11.9 100 each 1   levETIRAcetam (KEPPRA) 500 MG tablet Take 1 tablet (500 mg total) by mouth 2 (two) times daily. 60 tablet 6   OneTouch Delica Lancets 33G MISC Use to check blood sugar once a day.  DX: E11.9 100 each 1   pantoprazole (PROTONIX) 40 MG tablet Take 40 mg by mouth daily as needed (For acid reflux).     Rimegepant Sulfate (NURTEC) 75 MG TBDP Take 1 tab at onset of migraine.  May repeat in 2 hrs, if needed.  Max dose: 2 tabs/day. This is a 30 day prescription. (Patient taking differently: Take 1 tablet by mouth daily as needed (For migraine).) 12 tablet 11   No current facility-administered medications for this visit.    Allergies  Allergen Reactions   Aspirin     Causes brain bleed     Family History  Problem  Relation Age of Onset   Arthritis Mother    Stroke Mother    Diabetes Father    Heart disease Father    Hyperlipidemia Father    Hypertension Father    Kidney disease Maternal Grandfather    Colon cancer Neg Hx    Stomach cancer Neg Hx    Rectal cancer Neg Hx     Social History   Socioeconomic History   Marital status: Single    Spouse name: Not on file   Number of children: 0   Years of education: Not on file   Highest education level: 10th grade  Occupational History    Comment: NA  Tobacco Use   Smoking status: Former    Packs/day: 1    Types: Cigarettes    Passive exposure: Never   Smokeless tobacco: Former    Types: Chew   Tobacco comments:    quit 10-15 years ago  Vaping Use   Vaping Use: Never used  Substance and Sexual Activity   Alcohol use: Not Currently   Drug use: No    Sexual activity: Not Currently  Other Topics Concern   Not on file  Social History Narrative   Not on file   Social Determinants of Health   Financial Resource Strain: Low Risk  (07/25/2022)   Overall Financial Resource Strain (CARDIA)    Difficulty of Paying Living Expenses: Not hard at all  Food Insecurity: No Food Insecurity (02/13/2023)   Hunger Vital Sign    Worried About Running Out of Food in the Last Year: Never true    Ran Out of Food in the Last Year: Never true  Transportation Needs: No Transportation Needs (02/10/2023)   PRAPARE - Administrator, Civil Service (Medical): No    Lack of Transportation (Non-Medical): No  Physical Activity: Inactive (07/25/2022)   Exercise Vital Sign    Days of Exercise per Week: 0 days    Minutes of Exercise per Session: 0 min  Stress: No Stress Concern Present (07/25/2022)   [employer] of Occupational Health - Occupational Stress Questionnaire    Feeling of Stress : Not at all  Social Connections: Socially Isolated (07/25/2022)   Social Connection and Isolation Panel [NHANES]    Frequency of Communication with Friends and Family: More than three times a week    Frequency of Social Gatherings with Friends and Family: More than three times a week    Attends Religious Services: Never    Database administrator or Organizations: No    Attends Banker Meetings: Never    Marital Status: Never married  Intimate Partner Violence: Not At Risk (02/10/2023)   Humiliation, Afraid, Rape, and Kick questionnaire    Fear of Current or Ex-Partner: No    Emotionally Abused: No    Physically Abused: No    Sexually Abused: No     Constitutional: Patient reports intermittent headaches, fatigue.  Denies fever, malaise, fatigue, or abrupt weight changes.  HEENT: Denies eye pain, eye redness, ear pain, ringing in the ears, wax buildup, runny nose, nasal congestion, bloody nose, or sore throat. Respiratory: Denies difficulty  breathing, shortness of breath, cough or sputum production.   Cardiovascular: Denies chest pain, chest tightness, palpitations or swelling in the hands or feet.  Gastrointestinal: Denies abdominal pain, bloating, constipation, diarrhea or blood in the stool.  GU: Denies urgency, frequency, pain with urination, burning sensation, blood in urine, odor or discharge. Musculoskeletal: Patient reports bilateral hip pain, difficulty with  gait.  Denies decrease in range of motion, muscle pain or joint swelling.  Skin: Denies redness, rashes, lesions or ulcercations.  Neurological: Denies dizziness, difficulty with memory, difficulty with speech or problems with balance and coordination.  Psych: Denies anxiety, depression, SI/HI.  No other specific complaints in a complete review of systems (except as listed in HPI above).     Objective:   Physical Exam  BP 126/74 (BP Location: Right Arm, Patient Position: Sitting, Cuff Size: Normal)   Pulse 76   Temp (!) 97.1 F (36.2 C) (Temporal)   Wt 190 lb (86.2 kg)   SpO2 97%   BMI 23.75 kg/m   Wt Readings from Last 3 Encounters:  02/10/23 187 lb 6.3 oz (85 kg)  01/20/23 189 lb (85.7 kg)  11/15/22 189 lb (85.7 kg)    General: Appears his stated age, chronically ill appearing, in NAD. Skin: Warm, dry and intact.  HEENT: Head: normal shape and size; Eyes: sclera white, no icterus, conjunctiva pink, PERRLA and EOMs intact;  Cardiovascular: Normal rate and rhythm. S1,S2 noted.  No murmur, rubs or gallops noted. No JVD or BLE edema. No carotid bruits noted. Pulmonary/Chest: Normal effort and positive vesicular breath sounds. No respiratory distress. No wheezes, rales or ronchi noted.  Abdomen:  Normal bowel sounds.  Musculoskeletal: Strength 5/5 BUE/BLE.  Limping gait, using a walking stick for assistance. Neurological: Alert and oriented. Cranial nerves II-XII grossly intact. Coordination normal.  Psychiatric: Mood and affect normal. Behavior is  normal. Judgment and thought content normal.     BMET    Component Value Date/Time   NA 136 02/11/2023 0643   NA 141 07/16/2021 0813   K 3.8 02/11/2023 0643   CL 107 02/11/2023 0643   CO2 22 02/11/2023 0643   GLUCOSE 125 (H) 02/11/2023 0643   BUN 11 02/11/2023 0643   BUN 20 07/16/2021 0813   CREATININE 0.76 02/11/2023 0643   CREATININE 0.79 06/12/2022 1001   CALCIUM 8.3 (L) 02/11/2023 0643   GFRNONAA >60 02/11/2023 0643   GFRAA >60 11/11/2015 1340    Lipid Panel     Component Value Date/Time   CHOL 199 02/11/2023 0643   TRIG 81 02/11/2023 0643   HDL 39 (L) 02/11/2023 0643   CHOLHDL 5.1 02/11/2023 0643   VLDL 16 02/11/2023 0643   LDLCALC 144 (H) 02/11/2023 0643   LDLCALC 112 (H) 06/12/2022 1001    CBC    Component Value Date/Time   WBC 4.8 02/11/2023 0643   RBC 4.43 02/11/2023 0643   HGB 13.0 02/11/2023 0643   HGB 14.8 04/28/2022 1559   HCT 37.4 (L) 02/11/2023 0643   HCT 43.0 04/28/2022 1559   PLT 158 02/11/2023 0643   PLT 211 04/28/2022 1559   MCV 84.4 02/11/2023 0643   MCV 86 04/28/2022 1559   MCH 29.3 02/11/2023 0643   MCHC 34.8 02/11/2023 0643   RDW 13.2 02/11/2023 0643   RDW 12.6 04/28/2022 1559   LYMPHSABS 1.1 02/11/2023 0643   LYMPHSABS 1.1 04/28/2022 1559   MONOABS 0.6 02/11/2023 0643   EOSABS 0.0 02/11/2023 0643   EOSABS 0.1 04/28/2022 1559   BASOSABS 0.0 02/11/2023 0643   BASOSABS 0.0 04/28/2022 1559    Hgb A1C Lab Results  Component Value Date   HGBA1C 6.5 (H) 02/11/2023            Assessment & Plan:     RTC in 6 months for follow-up of chronic conditions Nicki Reaper, NP

## 2023-03-02 NOTE — Assessment & Plan Note (Signed)
Lipid profile reviewed Encouraged him to consume a low-fat diet Continue Atorvastatin 

## 2023-03-02 NOTE — Assessment & Plan Note (Signed)
Following with orthopedics. 

## 2023-03-02 NOTE — Assessment & Plan Note (Signed)
Lipid profile and A1c reviewed Encouraged him to consume a low fat diet  Continue Atorvastatin, ASA and Plavix

## 2023-03-02 NOTE — Assessment & Plan Note (Signed)
A1C reviewed Will check urine microalbumin today Encouraged low carb diet  Encouraged routine eye exam Encouraged routine foot exam He declines immunizations

## 2023-03-02 NOTE — Assessment & Plan Note (Signed)
Lipid profile reviewed Encouraged him to consume a low fat diet  Continue Atorvastatin, ASA and Plavix

## 2023-03-02 NOTE — Assessment & Plan Note (Addendum)
Continue Keppra and Lamotrigine He will continue to follow with neurology

## 2023-03-02 NOTE — Patient Instructions (Signed)

## 2023-03-02 NOTE — Assessment & Plan Note (Signed)
Avoid foods that trigger your reflux Not currently taking PPI

## 2023-03-03 LAB — MICROALBUMIN / CREATININE URINE RATIO
Creatinine, Urine: 202 mg/dL (ref 20–320)
Microalb Creat Ratio: 3 mg/g creat (ref <30)
Microalb, Ur: 0.7 mg/dL

## 2023-03-04 ENCOUNTER — Telehealth: Payer: Self-pay | Admitting: *Deleted

## 2023-03-04 NOTE — Telephone Encounter (Signed)
Pt given lab results per notes of R. Baity, NP from 03/03/23 on 03/04/23. Pt verbalized understanding diabetes is not affecting your kidneys. Patient reports his blood glucose has been dropping into the 70's. Instructed patient if continues or having sx call back schedule appt with PCP for management of preventing low blood glucose. Patient verbalized understanding.

## 2023-03-05 LAB — OPHTHALMOLOGY REPORT-SCANNED

## 2023-03-16 ENCOUNTER — Other Ambulatory Visit: Payer: Self-pay | Admitting: Internal Medicine

## 2023-03-16 ENCOUNTER — Telehealth: Payer: Self-pay | Admitting: Internal Medicine

## 2023-03-16 MED ORDER — ATORVASTATIN CALCIUM 40 MG PO TABS: 40.0000 mg | ORAL_TABLET | Freq: Every day | ORAL | 1 refills | Status: DC

## 2023-03-16 NOTE — Telephone Encounter (Signed)
He needs to be taking 40 mg

## 2023-03-16 NOTE — Telephone Encounter (Signed)
Medication Refill - Medication: atorvastatin (LIPITOR) 40 MG tablet   Has the patient contacted their pharmacy? No. Ariel with [facility] called to get clarification on whether provider wants to keep patient on 10mg  of Atorvastin or the 40mg  that he was on before he went into the hospital and they changed his medication. Please f/u for refills as patient took his last dose on yesterday.  Preferred Pharmacy (with phone number or street name): [facility] [phone] [place], [place] - [address]  Phone: [phone] Fax: [phone]  Has the patient been seen for an appointment in the last year OR does the patient have an upcoming appointment? No.  Agent: Please be advised that RX refills may take up to 3 business days. We ask that you follow-up with your pharmacy.

## 2023-03-16 NOTE — Addendum Note (Signed)
Addended by: Paschal Dopp on: 03/16/2023 04:32 PM   Modules accepted: Orders

## 2023-03-16 NOTE — Telephone Encounter (Signed)
Pt advised.  RX sent to the pharmacy.   Thanks,   -Vernona Rieger

## 2023-03-16 NOTE — Telephone Encounter (Signed)
Medication Refill - Medication: atorvastatin (LIPITOR) 40 MG tablet [[id number]]     Has the patient contacted their pharmacy? Yes.   (Agent: If no, request that the patient contact the pharmacy for the refill. If patient does not wish to contact the pharmacy document the reason why and proceed with request.) (Agent: If yes, when and what did the pharmacy advise?)  Preferred Pharmacy (with phone number or street name): [facility] [phone] [place], [place] - [address] [place] [address] Phone: [phone]  Fax: [phone]  Has the patient been seen for an appointment in the last year OR does the patient have an upcoming appointment? Yes.    Agent: Please be advised that RX refills may take up to 3 business days. We ask that you follow-up with your pharmacy.

## 2023-03-17 NOTE — Telephone Encounter (Signed)
Unable to refill per protocol, Rx request was refilled 03/16/23 for 90 days, duplicate request.  Requested Prescriptions  Pending Prescriptions Disp Refills   atorvastatin (LIPITOR) 40 MG tablet 90 tablet 1    Sig: Take 1 tablet (40 mg total) by mouth daily.     Cardiovascular:  Antilipid - Statins Failed - 03/16/2023  5:16 PM      Failed - Lipid Panel in normal range within the last 12 months    Cholesterol  Date Value Ref Range Status  02/11/2023 199 0 - 200 mg/dL Final   LDL Cholesterol (Calc)  Date Value Ref Range Status  06/12/2022 112 (H) mg/dL (calc) Final    Comment:    Reference range: <100 . Desirable range <100 mg/dL for primary prevention;   <70 mg/dL for patients with CHD or diabetic patients  with > or = 2 CHD risk factors. Marland Kitchen LDL-C is now calculated using the Martin-Hopkins  calculation, which is a validated novel method providing  better accuracy than the Friedewald equation in the  estimation of LDL-C.  Horald Pollen et al. Lenox Ahr. 1610;960(45): 2061-2068  (http://education.[employer].com/faq/FAQ164)    LDL Cholesterol  Date Value Ref Range Status  02/11/2023 144 (H) 0 - 99 mg/dL Final    Comment:           Total Cholesterol/HDL:CHD Risk Coronary Heart Disease Risk Table                     Men   Women  1/2 Average Risk   3.4   3.3  Average Risk       5.0   4.4  2 X Average Risk   9.6   7.1  3 X Average Risk  23.4   11.0        Use the calculated Patient Ratio above and the CHD Risk Table to determine the patient's CHD Risk.        ATP III CLASSIFICATION (LDL):  <100     mg/dL   Optimal  409-811  mg/dL   Near or Above                    Optimal  130-159  mg/dL   Borderline  914-782  mg/dL   High  >956     mg/dL   Very High Performed at [facility], [address] [place], [place] [address]    HDL  Date Value Ref Range Status  02/11/2023 39 (L) >40 mg/dL Final   Triglycerides  Date Value Ref Range Status  02/11/2023 81 <150 mg/dL  Final         Passed - Patient is not pregnant      Passed - Valid encounter within last 12 months    Recent Outpatient Visits           2 weeks ago Aortic atherosclerosis [facility])   [place] [facility] [place], Salvadore Oxford, NP   6 months ago Aortic atherosclerosis [facility])   [place] [place], Salvadore Oxford, NP   8 months ago [place]   [facility] [place], [place] W, NP   9 months ago Type 2 diabetes mellitus with hyperglycemia, without long-term current use of insulin [facility])   [place] [facility] [place], [place] W, NP   1 year ago Type 2 diabetes mellitus with hyperglycemia, without long-term current use of insulin [facility])   [facility] [place], Salvadore Oxford, NP       Future Appointments             In 1 week Rollene Rotunda, MD [facility]   In 2 months Levert Feinstein, MD [facility]   In 5 months [place], Salvadore Oxford, NP [facility] [place], [facility]

## 2023-03-22 NOTE — Progress Notes (Unsigned)
Cardiology Office Note   Date:  03/24/2023   ID:  Ralph Simpson, DOB [birth year], MRN [id number]  PCP:  Lorre Munroe, NP  Cardiologist:   None Referring:  Lorre Munroe, NP  Chief Complaint  Patient presents with   Cerebrovascular Accident     History of Present Illness: Ralph Simpson is a 62 y.o. male who presents for evaluation of CVA with acute ischemic ischemic infarcts in the right PCA and bilateral cerebellum  He was status post mechanical thrombectomy. Echo was unremarkable.   A monitor was ordered.  I have reviewed this today for this visit.  There was no evidence of atrial fibrillation.        I did look at notes from [facility] neurology.  I also reviewed hospital records.  He does have stable enhancing lesions in the left frontal lesion right frontal parietal and left occipital regions.  He previously declined brain biopsy.  He said that when he went home he had some weakness.  He does walk with a cane because he had joint problems.  However, he denies any palpitations and has had no presyncope or syncope.  He had no chest pressure, neck or arm discomfort.  He had no weight gain or edema.  He does have some fuzzy feeling.     Past Medical History:  Diagnosis Date   Allergy    Avascular necrosis of bones of both hips (HCC)    GERD (gastroesophageal reflux disease)    Migraine    Neutropenia (HCC)    Osteoarthritis of both hips    Plantar fasciitis    Seizures (HCC)    x 2 03/2021   Trochanteric bursitis of right hip     Past Surgical History:  Procedure Laterality Date   HERNIA REPAIR     HIP SURGERY     IR ANGIO INTRA EXTRACRAN SEL COM CAROTID INNOMINATE BILAT MOD SED  04/08/2021   IR ANGIO VERTEBRAL SEL SUBCLAVIAN INNOMINATE BILAT MOD SED  04/08/2021   IR ANGIO VERTEBRAL SEL VERTEBRAL UNI R MOD SED  02/10/2023   IR PERCUTANEOUS ART THROMBECTOMY/INFUSION INTRACRANIAL INC DIAG ANGIO  02/10/2023   IR US GUIDE VASC ACCESS RIGHT  04/08/2021   RADIOLOGY WITH  ANESTHESIA N/A 02/10/2023   Procedure: IR WITH ANESTHESIA;  Surgeon: Radiologist, Medication, MD;  Location: [facility] OR;  Service: Radiology;  Laterality: N/A;     Current Outpatient Medications  Medication Sig Dispense Refill   aspirin 81 MG chewable tablet Chew 1 tablet (81 mg total) by mouth daily. 21 tablet 0   atorvastatin (LIPITOR) 40 MG tablet Take 1 tablet (40 mg total) by mouth daily. 90 tablet 1   Blood Glucose Monitoring Suppl (ONETOUCH VERIO) w/Device KIT Used to check blood sugar once a day. DX E11.9 1 kit 0   cyclobenzaprine (FLEXERIL) 10 MG tablet Take 1 tablet (10 mg total) by mouth at bedtime as needed for muscle spasms. 30 tablet 0   glucose blood (RELION GLUCOSE TEST STRIPS) test strip Use as instructed 100 each 1   lamoTRIgine (LAMICTAL) 100 MG tablet Take 2 tablets (200 mg total) by mouth 2 (two) times daily. 120 tablet 11   Lancet Devices (RELION LANCING DEVICE) MISC Use to check blood sugar one a day.  DX: E11.9 100 each 1   levETIRAcetam (KEPPRA) 500 MG tablet Take 1 tablet (500 mg total) by mouth 2 (two) times daily. 60 tablet 6   pantoprazole (PROTONIX) 40 MG tablet Take 40 mg  by mouth daily as needed (For acid reflux).     Rimegepant Sulfate (NURTEC) 75 MG TBDP Take 1 tab at onset of migraine.  May repeat in 2 hrs, if needed.  Max dose: 2 tabs/day. This is a 30 day prescription. (Patient taking differently: Take 1 tablet by mouth daily as needed (For migraine).) 12 tablet 11   No current facility-administered medications for this visit.    Allergies:   Aspirin    Social History:  The patient  reports that he has quit smoking. His smoking use included cigarettes. He smoked an average of 1 pack per day. He has never been exposed to tobacco smoke. He has quit using smokeless tobacco.  His smokeless tobacco use included chew. He reports that he does not currently use alcohol. He reports that he does not use drugs.   Family History:  The patient's family history includes  Arthritis in his mother; Diabetes in his father; Heart disease in his father; Hyperlipidemia in his father; Hypertension in his father; Kidney disease in his maternal grandfather; Stroke in his mother.    ROS:  Please see the history of present illness.   Otherwise, review of systems are positive for none.   All other systems are reviewed and negative.    PHYSICAL EXAM: VS:  BP 110/70   Pulse 75   Ht 6\' 3"  (1.905 m)   Wt 190 lb 9.6 oz (86.5 kg)   SpO2 100%   BMI 23.82 kg/m  , BMI Body mass index is 23.82 kg/m. GENERAL:  Well appearing HEENT:  Pupils equal round and reactive, fundi not visualized, oral mucosa unremarkable NECK:  No jugular venous distention, waveform within normal limits, carotid upstroke brisk and symmetric, no bruits, no thyromegaly LYMPHATICS:  No cervical, inguinal adenopathy LUNGS:  Clear to auscultation bilaterally BACK:  No CVA tenderness CHEST:  Unremarkable HEART:  PMI not displaced or sustained,S1 and S2 within normal limits, no S3, no S4, no clicks, no rubs, no murmurs ABD:  Flat, positive bowel sounds normal in frequency in pitch, no bruits, no rebound, no guarding, no midline pulsatile mass, no hepatomegaly, no splenomegaly EXT:  2 plus pulses throughout, no edema, no cyanosis no clubbing SKIN:  No rashes no nodules NEURO:  Cranial nerves II through XII grossly intact, motor grossly intact throughout PSYCH:  Cognitively intact, oriented to person place and time    EKG:  EKG is not ordered today. The ekg ordered 11/15/2022 demonstrates normal sinus rhythm, rate 74, axis within normal limits, intervals within normal limits, no acute ST-T wave changes.   Recent Labs: 04/28/2022: TSH 1.130 02/11/2023: ALT 21; BUN 11; Creatinine, Ser 0.76; Hemoglobin 13.0; Platelets 158; Potassium 3.8; Sodium 136    Lipid Panel    Component Value Date/Time   CHOL 199 02/11/2023 0643   TRIG 81 02/11/2023 0643   HDL 39 (L) 02/11/2023 0643   CHOLHDL 5.1 02/11/2023 0643    VLDL 16 02/11/2023 0643   LDLCALC 144 (H) 02/11/2023 0643   LDLCALC 112 (H) 06/12/2022 1001      Wt Readings from Last 3 Encounters:  03/24/23 190 lb 9.6 oz (86.5 kg)  03/02/23 190 lb (86.2 kg)  02/10/23 187 lb 6.3 oz (85 kg)      Other studies Reviewed: Additional studies/ records that were reviewed today include: None. Review of the above records demonstrates:  Please see elsewhere in the note.     ASSESSMENT AND PLAN:  CVA: I did look through neurology notes both from  [facility] and from [place].  The etiology of his issues were thought to be embolic but it was no strong suggestion of fibrillation.  His monitor did not show fibrillation.  He is going to go back and see his neurologist in a couple of days and I have asked him to ask about whether he would like for him to have an implanted loop.  I did not feel strongly that this was necessary.  I would like to get the patient to buy a wearable.  For now he will be on aspirin alone.  Neurology suggested he could be off the Plavix.  Dyslipidemia: He was started on statin with an LDL of 144.  I think the goal should be at least less than 100.  Current medicines are reviewed at length with the patient today.  The patient does not have concerns regarding medicines.  The following changes have been made:  no change  Labs/ tests ordered today include:   Orders Placed This Encounter  Procedures   Lipid panel     Disposition:   FU with me in 3 months.   Signed, Rollene Rotunda, MD  03/24/2023 5:21 PM    [facility]

## 2023-03-24 ENCOUNTER — Encounter: Payer: Self-pay | Admitting: Cardiology

## 2023-03-24 ENCOUNTER — Ambulatory Visit: Payer: Medicare Other | Attending: Cardiology | Admitting: Cardiology

## 2023-03-24 VITALS — BP 110/70 | HR 75 | Ht 75.0 in | Wt 190.6 lb

## 2023-03-24 DIAGNOSIS — I639 Cerebral infarction, unspecified: Secondary | ICD-10-CM | POA: Diagnosis not present

## 2023-03-24 DIAGNOSIS — E782 Mixed hyperlipidemia: Secondary | ICD-10-CM | POA: Diagnosis not present

## 2023-03-24 NOTE — Patient Instructions (Signed)
Medication Instructions:   STOP PLAVIX  *If you need a refill on your cardiac medications before your next appointment, please call your pharmacy*   Follow-Up: At [facility], you and your health needs are our priority.  As part of our continuing mission to provide you with exceptional heart care, we have created designated Provider Care Teams.  These Care Teams include your primary Cardiologist (physician) and Advanced Practice Providers (APPs -  Physician Assistants and Nurse Practitioners) who all work together to provide you with the care you need, when you need it.  We recommend signing up for the patient portal called "MyChart".  Sign up information is provided on this After Visit Summary.  MyChart is used to connect with patients for Virtual Visits (Telemedicine).  Patients are able to view lab/test results, encounter notes, upcoming appointments, etc.  Non-urgent messages can be sent to your provider as well.   To learn more about what you can do with MyChart, go to [email or web address].    Your next appointment:   3 month(s)  Provider:   Rollene Rotunda MD   Other Instructions  ASK NEUROLOGIST IF THEY WANT AN IMPLANTABLE MONITOR

## 2023-03-27 ENCOUNTER — Other Ambulatory Visit: Payer: Self-pay | Admitting: Internal Medicine

## 2023-03-27 ENCOUNTER — Ambulatory Visit: Payer: Self-pay | Admitting: *Deleted

## 2023-03-27 NOTE — Telephone Encounter (Signed)
Requested medication (s) are due for refill today: yes  Requested medication (s) are on the active medication list: yes  Last refill:  07/16/21  Future visit scheduled: yes  Notes to clinic:  Medication not assigned to a protocol, review manually. Last refill by another provider, routing for approval.     Requested Prescriptions  Pending Prescriptions Disp Refills   Rimegepant Sulfate (NURTEC) 75 MG TBDP 12 tablet 11    Sig: Take 1 tab at onset of migraine.  May repeat in 2 hrs, if needed.  Max dose: 2 tabs/day. This is a 30 day prescription.     Off-Protocol Failed - 03/27/2023  2:08 PM      Failed - Medication not assigned to a protocol, review manually.      Passed - Valid encounter within last 12 months    Recent Outpatient Visits           3 weeks ago Aortic atherosclerosis [facility])   [place] [facility] [place], Salvadore Oxford, NP   7 months ago Aortic atherosclerosis [facility])   [place] [facility] [place], Salvadore Oxford, NP   9 months ago Pruritus   [place] [facility] [place], [place] W, NP   9 months ago Type 2 diabetes mellitus with hyperglycemia, without long-term current use of insulin [facility])   [place] [facility] [place], [place] W, NP   1 year ago Type 2 diabetes mellitus with hyperglycemia, without long-term current use of insulin [facility])   [place] [facility] [place], Salvadore Oxford, NP       Future Appointments             In 2 months Levert Feinstein, MD [facility]   In 2 months Rollene Rotunda, MD [facility] [place] [facility]   In 5 months [employer] [facility] [employer], Salvadore Oxford, NP  [facility], [facility]

## 2023-03-27 NOTE — Telephone Encounter (Signed)
Requested medication (s) are due for refill today: yes  Requested medication (s) are on the active medication list: yes  Last refill:  07/16/21  Future visit scheduled: yes  Notes to clinic:  Medication not assigned to a protocol, review manually. Last refilled by another provider.     Requested Prescriptions  Pending Prescriptions Disp Refills   Rimegepant Sulfate (NURTEC) 75 MG TBDP 12 tablet 11    Sig: Take 1 tab at onset of migraine.  May repeat in 2 hrs, if needed.  Max dose: 2 tabs/day. This is a 30 day prescription.     Off-Protocol Failed - 03/27/2023  2:08 PM      Failed - Medication not assigned to a protocol, review manually.      Passed - Valid encounter within last 12 months    Recent Outpatient Visits           3 weeks ago Aortic atherosclerosis [facility])   [place] [facility] [place], Salvadore Oxford, NP   7 months ago Aortic atherosclerosis [facility])   [place] [facility] [place], Salvadore Oxford, NP   9 months ago Pruritus   [place] [facility] [place], [place] W, NP   9 months ago Type 2 diabetes mellitus with hyperglycemia, without long-term current use of insulin [facility])   [place] [facility] [place], [place] W, NP   1 year ago Type 2 diabetes mellitus with hyperglycemia, without long-term current use of insulin [facility])   [place] [facility] [place], Salvadore Oxford, NP       Future Appointments             In 2 months Levert Feinstein, MD [facility]   In 2 months Rollene Rotunda, MD [facility]   In 5 months [place], Salvadore Oxford, NP [place] [facility], [facility]

## 2023-03-27 NOTE — Patient Outreach (Signed)
  Care Coordination   Follow Up Visit Note   03/27/2023 Name: Ralph Simpson MRN: [id number] DOB: [birth year]  Ralph Simpson is a 62 y.o. year old male who sees [place], Salvadore Oxford, NP for primary care. I spoke with  Ralph Simpson by phone today.  What matters to the patients health and wellness today?  Ongoing recovery from stroke and seizure.  Continues to have PT at home.     Goals Addressed             This Visit's Progress    Adequate recovery post stroke   On track    Care Coordination Interventions: Evaluation of current treatment plan related to stroke and patient's adherence to plan as established by provider Advised patient to monitor blood pressure daily and record readings Reviewed medications with patient and discussed affordability and adherence Reviewed scheduled/upcoming provider appointments including neurology follow upon 7/9 Discussed plans with patient for ongoing care management follow up and provided patient with direct contact information for care management team Advised patient to call insurance company to inquire about life alert system that may be covered.  Also advised to call to get alternatives for transportation in case family can't take him         SDOH assessments and interventions completed:  No     Care Coordination Interventions:  Yes, provided   Interventions Today    Flowsheet Row Most Recent Value  Chronic Disease   Chronic disease during today's visit Other  [stroke]  General Interventions   General Interventions Discussed/Reviewed General Interventions Reviewed, Doctor Visits  Doctor Visits Discussed/Reviewed Doctor Visits Reviewed, PCP, Specialist  [6/18 with neuro/seizure specialist, 7/16 labs with PCP, and 7/9 with stroke neuro]  PCP/Specialist Visits Compliance with follow-up visit  Education Interventions   Education Provided Provided Education  Provided Verbal Education On Medication, Blood Sugar Monitoring, Other, When to see  the doctor  [Medication changes to decrease risk for seizure, now tapering Keppra up.  Monitoring blood pressure, HR, and glucose (today was 103/72, 71, and 174)]        Follow up plan: Follow up call scheduled for 6/5    Encounter Outcome:  Pt. Visit Completed   Kemper Durie, RN, MSN, [facility] Care Management Care Management Coordinator [phone]

## 2023-03-27 NOTE — Telephone Encounter (Signed)
Medication Refill - Medication: Rimegepant Sulfate (NURTEC) 75 MG TBDP   Has the patient contacted their pharmacy? Yes.     (Preferred Pharmacy (with phone number or street name):  [facility] [phone] [facility] [place] - [address]  [address] [place] [address]  Phone: [phone] Fax: [phone]  Hours: Not open 24 hours     Has the patient been seen for an appointment in the last year OR does the patient have an upcoming appointment? Yes.    Agent: Please be advised that RX refills may take up to 3 business days. We ask that you follow-up with your pharmacy.

## 2023-03-29 ENCOUNTER — Other Ambulatory Visit: Payer: Self-pay | Admitting: Neurology

## 2023-03-30 ENCOUNTER — Telehealth: Payer: Self-pay | Admitting: Pharmacy Technician

## 2023-03-30 ENCOUNTER — Encounter (HOSPITAL_COMMUNITY): Payer: Self-pay

## 2023-03-30 ENCOUNTER — Other Ambulatory Visit (HOSPITAL_COMMUNITY): Payer: Self-pay

## 2023-03-30 HISTORY — PX: IR CT HEAD LTD: IMG2386

## 2023-03-30 NOTE — Telephone Encounter (Signed)
Patient Advocate Encounter   Received notification that prior authorization for Nurtec 75MG  dispersible tablets is required.   PA submitted on 03/30/2023 [id number] [facility] Medicare Electronic Prior Authorization Request Form Status is pending

## 2023-03-30 NOTE — Telephone Encounter (Signed)
Pharmacy Patient Advocate Encounter  Prior Authorization for Nurtec 75MG  dispersible tablets has been approved by [employer] (ins).    PA # PA Case ID #: [id number] Effective dates: 03/30/2023 through 11/16/2098

## 2023-04-06 ENCOUNTER — Telehealth: Payer: Self-pay | Admitting: Cardiology

## 2023-04-06 DIAGNOSIS — I639 Cerebral infarction, unspecified: Secondary | ICD-10-CM

## 2023-04-06 NOTE — Telephone Encounter (Signed)
Caller stated patient wants to get a loop implant and wants a call back to discuss.  Caller stated can speak directly with patient at [phone].

## 2023-04-06 NOTE — Telephone Encounter (Signed)
Spoke to patient he stated he has decided he wants to have loop recorder.Advised I will send message to Dr.Hochrein.

## 2023-04-07 NOTE — Telephone Encounter (Signed)
I am just now finding out that Ralph Simpson is leaving! You are correct, it should not be Mondays, but first and third Thursdays.  Unfortunately, I am not in the office on a Thursday due to rounding weeks and vacation. My next availability is Thursday June 20. If that is too long to wait, please try the EP folks at [facility].

## 2023-04-07 NOTE — Telephone Encounter (Signed)
Per Dr. Joslyn Devon, Fayrene Fearing, MD  Pugh, Juluis Pitch D, LPN; Cv Div Nl [id number] hours ago (7:50 PM)    Per my note, I reviewed the note from [facility] neurology and they are suggesting an implanted monitor.    Please arrange with Dr. Royann Shivers if possible.     Notes from [facility]  "  His strokes involved bilateral posterior circulation which have an embolic appearance. He did have an event monitor x 30 days which did not identify atrial fibrillation. He may need benefit from loop recorder for long monitoring."     Referral placed to Dr. Royann Shivers for loop implant- will forward to Dr. Renaye Rakers  nurse to see when this can be scheduled.

## 2023-04-07 NOTE — Telephone Encounter (Signed)
Referral placed for EP at [address].

## 2023-04-14 ENCOUNTER — Telehealth: Payer: Self-pay | Admitting: Emergency Medicine

## 2023-04-14 NOTE — Telephone Encounter (Signed)
Called, voicemail not set up. Need to "push back" his appt for 05/07/23 with Dr Royann Shivers for Loop implant to 1200 instead of 11:40.  Will send MyChart Message as well.

## 2023-04-22 ENCOUNTER — Other Ambulatory Visit: Payer: Self-pay | Admitting: Neurology

## 2023-04-22 ENCOUNTER — Encounter: Payer: Medicare Other | Admitting: *Deleted

## 2023-04-22 DIAGNOSIS — G96198 Other disorders of meninges, not elsewhere classified: Secondary | ICD-10-CM

## 2023-04-23 ENCOUNTER — Ambulatory Visit: Payer: Self-pay | Admitting: *Deleted

## 2023-04-23 NOTE — Patient Outreach (Signed)
  Care Coordination   Follow Up Visit Note   04/23/2023 Name: CLAUS DUREE MRN: [id number] DOB: 01/21/1961  [name] is a 62 y.o. year old male who sees [place], Salvadore Oxford, NP for primary care. I spoke with  [name] by phone today.  What matters to the patients health and wellness today?  Have loop recorder without complications    Goals Addressed             This Visit's Progress    Adequate recovery post stroke   On track    Care Coordination Interventions: Evaluation of current treatment plan related to stroke and patient's adherence to plan as established by provider Advised patient to monitor blood pressure daily and record readings Reviewed medications with patient and discussed affordability and adherence Reviewed scheduled/upcoming provider appointments including neurology follow upon 7/9 Discussed plans with patient for ongoing care management follow up and provided patient with direct contact information for care management team Advised patient to call insurance company to inquire about life alert system that may be covered.          SDOH assessments and interventions completed:  No     Care Coordination Interventions:  Yes, provided   Interventions Today    Flowsheet Row Most Recent Value  Chronic Disease   Chronic disease during today's visit Other  [seizure/stroke]  General Interventions   General Interventions Discussed/Reviewed General Interventions Reviewed, Doctor Visits  [Will have loop recorder placed on 6/20]  Doctor Visits Discussed/Reviewed Doctor Visits Reviewed, PCP, Specialist  [ENT on 6/10, MRI on 6/12, and neuro follow upon 6/18]  PCP/Specialist Visits Compliance with follow-up visit  Education Interventions   Education Provided Provided Education  Provided Verbal Education On Medication, When to see the doctor, Other  [Will continue monitoring blood pressure regularly, will also look into life alert system.  Family is supportive as  he is currently not able to drive]       Follow up plan: Follow up call scheduled for 7/1    Encounter Outcome:  Pt. Visit Completed   Kemper Durie, RN, MSN, [facility] Care Management Care Management Coordinator [phone]

## 2023-04-29 ENCOUNTER — Other Ambulatory Visit: Payer: Self-pay

## 2023-04-29 ENCOUNTER — Emergency Department (HOSPITAL_COMMUNITY)
Admission: EM | Admit: 2023-04-29 | Discharge: 2023-04-30 | Disposition: A | Discharge status: Home / Self Care | Payer: Medicare Other | Attending: Emergency Medicine | Admitting: Emergency Medicine

## 2023-04-29 ENCOUNTER — Encounter (HOSPITAL_COMMUNITY): Payer: Self-pay

## 2023-04-29 ENCOUNTER — Ambulatory Visit
Admission: RE | Admit: 2023-04-29 | Discharge: 2023-04-29 | Disposition: A | Discharge status: Home / Self Care | Payer: Medicare Other | Source: Ambulatory Visit | Attending: Neurology | Admitting: Neurology

## 2023-04-29 ENCOUNTER — Emergency Department (HOSPITAL_COMMUNITY): Payer: Medicare Other

## 2023-04-29 DIAGNOSIS — D72819 Decreased white blood cell count, unspecified: Secondary | ICD-10-CM | POA: Diagnosis not present

## 2023-04-29 DIAGNOSIS — Z7982 Long term (current) use of aspirin: Secondary | ICD-10-CM | POA: Diagnosis not present

## 2023-04-29 DIAGNOSIS — G459 Transient cerebral ischemic attack, unspecified: Secondary | ICD-10-CM | POA: Insufficient documentation

## 2023-04-29 DIAGNOSIS — R29818 Other symptoms and signs involving the nervous system: Secondary | ICD-10-CM | POA: Diagnosis not present

## 2023-04-29 DIAGNOSIS — G96198 Other disorders of meninges, not elsewhere classified: Secondary | ICD-10-CM | POA: Diagnosis present

## 2023-04-29 DIAGNOSIS — R569 Unspecified convulsions: Secondary | ICD-10-CM | POA: Diagnosis not present

## 2023-04-29 DIAGNOSIS — R531 Weakness: Secondary | ICD-10-CM | POA: Diagnosis present

## 2023-04-29 LAB — COMPREHENSIVE METABOLIC PANEL
ALT: 25 U/L (ref 0–44)
AST: 19 U/L (ref 15–41)
Albumin: 4.3 g/dL (ref 3.5–5.0)
Alkaline Phosphatase: 66 U/L (ref 38–126)
Anion gap: 9 (ref 5–15)
BUN: 20 mg/dL (ref 8–23)
CO2: 24 mmol/L (ref 22–32)
Calcium: 9 mg/dL (ref 8.9–10.3)
Chloride: 103 mmol/L (ref 98–111)
Creatinine, Ser: 0.91 mg/dL (ref 0.61–1.24)
GFR, Estimated: >60 mL/min (ref >60)
Glucose, Bld: 117 mg/dL — ABNORMAL HIGH (ref 70–99)
Potassium: 3.9 mmol/L (ref 3.5–5.1)
Sodium: 136 mmol/L (ref 135–145)
Total Bilirubin: 0.4 mg/dL (ref 0.3–1.2)
Total Protein: 6.7 g/dL (ref 6.5–8.1)

## 2023-04-29 LAB — I-STAT CHEM 8, ED
BUN: 22 mg/dL (ref 8–23)
Calcium, Ion: 1.24 mmol/L (ref 1.15–1.40)
Chloride: 104 mmol/L (ref 98–111)
Creatinine, Ser: 0.9 mg/dL (ref 0.61–1.24)
Glucose, Bld: 113 mg/dL — ABNORMAL HIGH (ref 70–99)
HCT: 43 % (ref 39.0–52.0)
Hemoglobin: 14.6 g/dL (ref 13.0–17.0)
Potassium: 3.8 mmol/L (ref 3.5–5.1)
Sodium: 140 mmol/L (ref 135–145)
TCO2: 27 mmol/L (ref 22–32)

## 2023-04-29 LAB — DIFFERENTIAL
Abs Immature Granulocytes: 0.01 10*3/uL (ref 0.00–0.07)
Basophils Absolute: 0 10*3/uL (ref 0.0–0.1)
Basophils Relative: 1 %
Eosinophils Absolute: 0.1 10*3/uL (ref 0.0–0.5)
Eosinophils Relative: 2 %
Immature Granulocytes: 0 %
Lymphocytes Relative: 34 %
Lymphs Abs: 1.3 10*3/uL (ref 0.7–4.0)
Monocytes Absolute: 0.4 10*3/uL (ref 0.1–1.0)
Monocytes Relative: 10 %
Neutro Abs: 2.1 10*3/uL (ref 1.7–7.7)
Neutrophils Relative %: 53 %

## 2023-04-29 LAB — CBC
HCT: 44.3 % (ref 39.0–52.0)
Hemoglobin: 15.1 g/dL (ref 13.0–17.0)
MCH: 29.4 pg (ref 26.0–34.0)
MCHC: 34.1 g/dL (ref 30.0–36.0)
MCV: 86.2 fL (ref 80.0–100.0)
Platelets: 171 10*3/uL (ref 150–400)
RBC: 5.14 MIL/uL (ref 4.22–5.81)
RDW: 12.9 % (ref 11.5–15.5)
WBC: 3.9 10*3/uL — ABNORMAL LOW (ref 4.0–10.5)
nRBC: 0 % (ref 0.0–0.2)

## 2023-04-29 LAB — APTT: aPTT: 27 seconds (ref 24–36)

## 2023-04-29 LAB — URINALYSIS, ROUTINE W REFLEX MICROSCOPIC
Bacteria, UA: NONE SEEN
Bilirubin Urine: NEGATIVE
Glucose, UA: NEGATIVE mg/dL
Hgb urine dipstick: NEGATIVE
Ketones, ur: 5 mg/dL — AB
Leukocytes,Ua: NEGATIVE
Nitrite: NEGATIVE
Protein, ur: NEGATIVE mg/dL
Specific Gravity, Urine: 1.015 (ref 1.005–1.030)
pH: 5 (ref 5.0–8.0)

## 2023-04-29 LAB — PROTIME-INR
INR: 1 (ref 0.8–1.2)
Prothrombin Time: 13.8 seconds (ref 11.4–15.2)

## 2023-04-29 MED ORDER — GADOBUTROL 1 MMOL/ML IV SOLN
8.0000 mL | Freq: Once | INTRAVENOUS | Status: AC | PRN
  Administered 2023-04-29: 8 mL via INTRAVENOUS

## 2023-04-29 MED ORDER — IOHEXOL 350 MG/ML SOLN
75.0000 mL | Freq: Once | INTRAVENOUS | Status: AC | PRN
  Administered 2023-04-29: 75 mL via INTRAVENOUS

## 2023-04-29 NOTE — ED Provider Notes (Signed)
[facility] EMERGENCY DEPARTMENT AT [facility] Provider Note   CSN: [id number] Arrival date & time: 04/29/23  2130     History  Chief Complaint  Patient presents with   Transient Ischemic Attack    Ralph Simpson is a 62 y.o. male.  He has a history of stroke and also seizures.  Stroke involved left-sided weakness and needed thrombectomy.  He had an outpatient MRI today.  Did not feel well afterwards and after dinner went to sleep in his chair.  Last known well was 6:30 PM.  At 715 he awoke with numbness of his right arm and felt that it may be weak.  This progressed to his chest and right leg.  Possibly with some speech difficulty.  Symptoms lasted primarily 20 minutes although he is not sure if his speech is back to baseline.  He ultimately called 911 who brought him here for further evaluation.  No headache blurry vision double vision chest pain shortness of breath.  The history is provided by the patient and the EMS personnel.  Cerebrovascular Accident This is a new problem. The current episode started 3 to 5 hours ago. The problem has been resolved. Pertinent negatives include no chest pain, no abdominal pain, no headaches and no shortness of breath. Nothing aggravates the symptoms. Nothing relieves the symptoms. He has tried rest for the symptoms. The treatment provided significant relief.       Home Medications Prior to Admission medications   Medication Sig Start Date End Date Taking? Authorizing Provider  aspirin 81 MG chewable tablet Chew 1 tablet (81 mg total) by mouth daily. 02/13/23   Lynnae January, NP  atorvastatin (LIPITOR) 40 MG tablet Take 1 tablet (40 mg total) by mouth daily. 03/16/23   Lorre Munroe, NP  Blood Glucose Monitoring Suppl (ONETOUCH VERIO) w/Device KIT Used to check blood sugar once a day. DX E11.9 05/01/22   Lorre Munroe, NP  cyclobenzaprine (FLEXERIL) 10 MG tablet Take 1 tablet (10 mg total) by mouth at bedtime as needed for muscle spasms.  08/22/22   Lorre Munroe, NP  glucose blood (RELION GLUCOSE TEST STRIPS) test strip Use as instructed 08/22/22   Lorre Munroe, NP  lamoTRIgine (LAMICTAL) 100 MG tablet Take 2 tablets (200 mg total) by mouth 2 (two) times daily. 01/20/23   Levert Feinstein, MD  Lancet Devices (RELION LANCING DEVICE) MISC Use to check blood sugar one a day.  DX: E11.9 08/22/22   Lorre Munroe, NP  levETIRAcetam (KEPPRA) 500 MG tablet Take 1 tablet (500 mg total) by mouth 2 (two) times daily. 01/20/23 08/18/23  Levert Feinstein, MD  pantoprazole (PROTONIX) 40 MG tablet Take 40 mg by mouth daily as needed (For acid reflux). 07/29/21   [provider]  Rimegepant Sulfate (NURTEC) 75 MG TBDP TAKE 1 TAB AT ONSET OF MIGRAINE. MAY REPEAT IN 2 HRS, IF NEEDED. MAX DOSE: 2 TABS/DAY. 03/30/23   Levert Feinstein, MD      Allergies    Aspirin    Review of Systems   Review of Systems  Constitutional:  Negative for fever.  Eyes:  Negative for visual disturbance.  Respiratory:  Negative for shortness of breath.   Cardiovascular:  Negative for chest pain.  Gastrointestinal:  Negative for abdominal pain.  Neurological:  Positive for speech difficulty, weakness and numbness. Negative for headaches.    Physical Exam Updated Vital Signs BP 109/77   Pulse 62   Temp 98.2 F (36.8 C) (  Oral)   Resp 12   SpO2 98%  Physical Exam Vitals and nursing note reviewed.  Constitutional:      General: He is not in acute distress.    Appearance: Normal appearance. He is well-developed.  HENT:     Head: Normocephalic and atraumatic.  Eyes:     Conjunctiva/sclera: Conjunctivae normal.  Cardiovascular:     Rate and Rhythm: Normal rate and regular rhythm.     Heart sounds: No murmur heard. Pulmonary:     Effort: Pulmonary effort is normal. No respiratory distress.     Breath sounds: Normal breath sounds.  Abdominal:     Palpations: Abdomen is soft.     Tenderness: There is no abdominal tenderness. There is no guarding or rebound.   Musculoskeletal:        General: No deformity. Normal range of motion.     Cervical back: Neck supple.  Skin:    General: Skin is warm and dry.     Capillary Refill: Capillary refill takes less than 2 seconds.  Neurological:     General: No focal deficit present.     Mental Status: He is alert and oriented to person, place, and time.     Cranial Nerves: No cranial nerve deficit.     Sensory: No sensory deficit.     Motor: No weakness.     ED Results / Procedures / Treatments   Labs (all labs ordered are listed, but only abnormal results are displayed) Labs Reviewed  CBC - Abnormal; Notable for the following components:      Result Value   WBC 3.9 (*)    All other components within normal limits  COMPREHENSIVE METABOLIC PANEL - Abnormal; Notable for the following components:   Glucose, Bld 117 (*)    All other components within normal limits  URINALYSIS, ROUTINE W REFLEX MICROSCOPIC - Abnormal; Notable for the following components:   Color, Urine STRAW (*)    Ketones, ur 5 (*)    All other components within normal limits  I-STAT CHEM 8, ED - Abnormal; Notable for the following components:   Glucose, Bld 113 (*)    All other components within normal limits  PROTIME-INR  APTT  DIFFERENTIAL  RAPID URINE DRUG SCREEN, HOSP PERFORMED    EKG None  Radiology CT ANGIO HEAD NECK W WO CM  Result Date: 04/30/2023 CLINICAL DATA:  Initial evaluation for TIA. EXAM: CT ANGIOGRAPHY HEAD AND NECK WITH AND WITHOUT CONTRAST TECHNIQUE: Multidetector CT imaging of the head and neck was performed using the standard protocol during bolus administration of intravenous contrast. Multiplanar CT image reconstructions and MIPs were obtained to evaluate the vascular anatomy. Carotid stenosis measurements (when applicable) are obtained utilizing NASCET criteria, using the distal internal carotid diameter as the denominator. RADIATION DOSE REDUCTION: This exam was performed according to the  departmental dose-optimization program which includes automated exposure control, adjustment of the mA and/or kV according to patient size and/or use of iterative reconstruction technique. CONTRAST:  75mL OMNIPAQUE IOHEXOL 350 MG/ML SOLN COMPARISON:  Prior MRI from earlier the same day as well as prior CTA from 02/10/2023. FINDINGS: CT HEAD FINDINGS Brain: Cerebral volume within normal limits. Chronic lacunar infarct at the right thalamocapsular region, new from prior CT, and consistent with previously identified PCA occlusion. No other acute large vessel territory infarct. No acute intracranial hemorrhage. Subtle sulcal effacement with hyperdensity seen at the high left frontal lobe (series 4, image 27). 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 No other mass lesion, mass effect or midline shift. No hydrocephalus or extra-axial fluid collection. Vascular: No abnormal hyperdense vessel. Skull: Scalp soft tissues and calvarium demonstrate no acute finding. Sinuses/Orbits: Globes orbital soft tissues within normal limits. Prior ocular lens replacement on the left. Paranasal sinuses and mastoid air cells are clear. Other: None. Review of the MIP images confirms the above findings CTA NECK FINDINGS Aortic arch: Standard branching. Imaged portion shows no evidence of aneurysm or dissection. No significant stenosis of the major arch vessel origins. Right carotid system: No evidence of dissection, stenosis (50% or greater), or occlusion. Left carotid system: No evidence of dissection, stenosis (50% or greater), or occlusion. Vertebral arteries: Both vertebral arteries arise from subclavian arteries. No proximal subclavian artery stenosis. Left vertebral artery slightly dominant. Vertebral arteries patent without stenosis or dissection. Skeleton: No discrete or worrisome osseous lesions. Mild spondylosis for age. Other neck: No other acute abnormality  within the neck. Upper chest: Emphysema.  No other acute finding. Review of the MIP images confirms the above findings CTA HEAD FINDINGS Anterior circulation: Both internal carotid arteries widely patent to the termini without stenosis. A1 segments widely patent. Normal anterior communicating artery complex. Both anterior cerebral arteries widely patent to their distal aspects without stenosis. No M1 stenosis or occlusion. Normal MCA bifurcations. Distal MCA branches well perfused and symmetric. Posterior circulation: Both V4 segments patent without stenosis. Both PICA patent. Basilar widely patent without stenosis. Superior cerebellar and posterior cerebral arteries widely patent bilaterally. Venous sinuses: Patent allowing for timing the contrast bolus. Anatomic variants: None significant. Subtle leptomeningeal enhancement present at the high left frontal lobe, corresponding with abnormality on prior brain MRI (series 6, image 45). No intracranial aneurysm. Review of the MIP images confirms the above findings IMPRESSION: 1. Negative CTA of the head and neck. No large vessel occlusion or other emergent finding. No significant atheromatous disease or hemodynamically significant stenosis. 2. Subtle sulcal effacement with hyperdensity and leptomeningeal enhancement at the high left frontal lobe, corresponding with the abnormality on prior brain MRI. 3. Chronic lacunar infarct at the right thalamocapsular region, in keeping with the previously identified PCA occlusion. Right PCA now widely patent status post revascularization. 4. No other acute intracranial abnormality. Electronically Signed   By: Rise Mu M.D.   On: 04/30/2023 01:56   MR BRAIN WO CONTRAST  Result Date: 04/30/2023 CLINICAL DATA:  Initial evaluation for neuro deficit, stroke suspected. EXAM: MRI HEAD WITHOUT CONTRAST TECHNIQUE: Multiplanar, multiecho pulse sequences of the brain and surrounding structures were obtained without  intravenous contrast. COMPARISON:  Comparison made with prior MRI from earlier the same day. FINDINGS: Brain: Limited diffusion-weighted imaging of the brain was performed to evaluate for stroke. Diffusion-weighted sequences demonstrate no acute or subacute ischemia. Note is made of a small subcentimeter focus of apparent diffusion signal abnormality at the level of the right thalamus (series 5, image 78). No convincing ADC correlate. Additionally, this is located along the margin of a chronic right thalamic lacunar infarct. Given this, this is favored to be artifactual. Otherwise, no other visible acute intracranial abnormality. No visible mass lesion, mass effect, or midline shift. No hydrocephalus or extra-axial fluid collection. Vascular: Not assessed on this limited exam. Skull and upper cervical spine: Not assessed on this limited exam. Sinuses/Orbits: Not assessed on this limited exam. Other: None. IMPRESSION: 1. Limited brain MRI with diffusion-weighted imaging only performed. No acute or subacute infarct. 2. Small focus of apparent diffusion signal abnormality  at the right thalamus, located along the margin of a chronic right thalamic lacunar infarct, and favored to be artifactual. Electronically Signed   By: Rise Mu M.D.   On: 04/30/2023 01:27    Procedures Procedures    Medications Ordered in ED Medications - No data to display  ED Course/ Medical Decision Making/ A&P Clinical Course as of 04/30/23 0841  Wed Apr 29, 2023  2155 EKG showing normal sinus rhythm no acute ST-T changes.  Not crossing in epic. [MB]  2157 Reviewed case with Dr. Amada Jupiter neurology.  He did not feel stroke activation was indicated but did recommend a CTA head and neck and they will consult on the patient.  Anticipate will likely need admission for further stroke workup. [MB]    Clinical Course User Index [MB] Terrilee Files, MD                             Medical Decision Making Amount and/or  Complexity of Data Reviewed Labs: ordered. Radiology: ordered.  Risk Prescription drug management.   This patient complains of numbness tingling right side with some possible weakness possible speech difficulty; this involves an extensive number of treatment Options and is a complaint that carries with it a high risk of complications and morbidity. The differential includes stroke, TIA, migraine, seizure, mania  I ordered, reviewed and interpreted labs, which included CBC with mildly low white count, chemistries unremarkable, urine negative for signs of infection I ordered imaging studies which included CT angio head and neck and I independently    visualized and interpreted imaging which showed no LVO, does have some chronic findings unchanged from prior MRI Additional history obtained from EMS Previous records obtained and reviewed in epic including recent discharge summary and neurology notes I consulted neurology Dr. Amada Jupiter and discussed lab and imaging findings and discussed disposition.  Cardiac monitoring reviewed, normal sinus rhythm Social determinants considered, patient physically inactive and socially isolated Critical Interventions: None  After the interventions stated above, I reevaluated the patient and found patient to be in no distress with no significant neurologic findings Admission and further testing considered, his care is signed out to Dr. Oletta Cohn to follow-up on final recommendations from neurology.         Final Clinical Impression(s) / ED Diagnoses Final diagnoses:  TIA (transient ischemic attack)    Rx / DC Orders ED Discharge Orders     None         Terrilee Files, MD 04/30/23 [id number]

## 2023-04-29 NOTE — ED Triage Notes (Signed)
[facility]. Timeline is not clear, pt laid down to sleep at 18:30 and woke up around 1945. He did not feel good upon sleeping, when he woke up he had numbness and tingling in the right arm. Sensation went down into his right side but all symptoms resolved within 15-20 minutes. PMH of CVA but no residuals, seizures. Pt had MRI today for seizures.

## 2023-04-30 ENCOUNTER — Emergency Department (HOSPITAL_COMMUNITY): Payer: Medicare Other

## 2023-04-30 DIAGNOSIS — G459 Transient cerebral ischemic attack, unspecified: Secondary | ICD-10-CM | POA: Diagnosis not present

## 2023-04-30 DIAGNOSIS — R29818 Other symptoms and signs involving the nervous system: Secondary | ICD-10-CM | POA: Diagnosis not present

## 2023-04-30 LAB — RAPID URINE DRUG SCREEN, HOSP PERFORMED
Amphetamines: NOT DETECTED
Barbiturates: NOT DETECTED
Benzodiazepines: NOT DETECTED
Cocaine: NOT DETECTED
Opiates: NOT DETECTED
Tetrahydrocannabinol: NOT DETECTED

## 2023-04-30 MED ORDER — CLOPIDOGREL BISULFATE 75 MG PO TABS: 75.0000 mg | ORAL_TABLET | Freq: Every day | ORAL | 0 refills | Status: DC

## 2023-04-30 MED ORDER — CLOPIDOGREL BISULFATE 75 MG PO TABS
75.0000 mg | ORAL_TABLET | Freq: Once | ORAL | Status: AC
  Administered 2023-04-30: 75 mg via ORAL
  Filled 2023-04-30: qty 1

## 2023-04-30 MED ORDER — LEVETIRACETAM 500 MG PO TABS
500.0000 mg | ORAL_TABLET | Freq: Once | ORAL | Status: DC
  Filled 2023-04-30: qty 1

## 2023-04-30 MED ORDER — LEVETIRACETAM 500 MG PO TABS
1500.0000 mg | ORAL_TABLET | Freq: Once | ORAL | Status: AC
  Administered 2023-04-30: 1500 mg via ORAL
  Filled 2023-04-30: qty 3

## 2023-04-30 NOTE — Consult Note (Signed)
Neurology Consultation Reason for Consult: Right-sided numbness Referring Physician: Polina, C  CC: Right-sided numbness and weakness  History is obtained from: Patient  HPI: Ralph Simpson is a 62 y.o. male with a history of complicated migraine, seizures, and recent embolic stroke who presents with transient right-sided numbness and weakness.  He also had some difficulty with speech in that timeframe.  He states that the first thing that he noticed was tingling around his shoulder which gradually moved down over the course of about 15 minutes to involve his entire right side.  He also had some difficulty with word finding during that time.  At the time of his arrival to the emergency department, his symptoms were essentially resolved.  He does have frequent right-sided involvement with his seizures, but this typically starts with twitching, and he has weakness afterwards, but has not had an episode like this before without twitching.  He does have a history of migraines with weakness and numbness, but those usually involve the left side though they do involve the right side as well.  He did not have a headache after the episode today, but he does not always have a headache after his previous episodes as well.  Finally, he recently had embolic strokes.    Past Medical History:  Diagnosis Date   Allergy    Avascular necrosis of bones of both hips (HCC)    GERD (gastroesophageal reflux disease)    Migraine    Neutropenia (HCC)    Osteoarthritis of both hips    Plantar fasciitis    Seizures (HCC)    x 2 03/2021   Trochanteric bursitis of right hip      Family History  Problem Relation Age of Onset   Arthritis Mother    Stroke Mother    Diabetes Father    Heart disease Father    Hyperlipidemia Father    Hypertension Father    Kidney disease Maternal Grandfather    Colon cancer Neg Hx    Stomach cancer Neg Hx    Rectal cancer Neg Hx      Social History:  reports that he has quit  smoking. His smoking use included cigarettes. He smoked an average of 1 pack per day. He has never been exposed to tobacco smoke. He has quit using smokeless tobacco.  His smokeless tobacco use included chew. He reports that he does not currently use alcohol. He reports that he does not use drugs.   Exam: Current vital signs: BP 114/78   Pulse 65   Temp 99.3 F (37.4 C) (Oral)   Resp 17   SpO2 100%  Vital signs in last 24 hours: Temp:  [99.3 F (37.4 C)] 99.3 F (37.4 C) (06/12 2133) Pulse Rate:  [62-65] 65 (06/12 2345) Resp:  [14-17] 17 (06/12 2345) BP: (114-126)/(78-80) 114/78 (06/12 2345) SpO2:  [97 %-100 %] 100 % (06/12 2345)   Physical Exam  Appears well-developed and well-nourished.   Neuro: Mental Status: Patient is awake, alert, oriented to person, place, month, year, and situation. Patient is able to give a clear and coherent history. No signs of aphasia or neglect Cranial Nerves: II: Visual Fields are full. Pupils are equal, round, and reactive to light.   III,IV, VI: EOMI without ptosis or diploplia.  V: Facial sensation is symmetric to temperature VII: Facial movement is symmetric.  VIII: hearing is intact to voice X: Uvula elevates symmetrically XI: Shoulder shrug is symmetric. XII: tongue is midline without atrophy or fasciculations.  Motor: Tone is normal. Bulk is normal. 5/5 strength was present in bilateral upper extremities, and bilateral lower extremities he has difficulty due to pain, but is able to lift both against gravity. Sensory: Sensation is symmetric to light touch and temperature in the arms and legs. Cerebellar: No clear ataxia finger-nose-finger  I have reviewed labs in epic and the results pertinent to this consultation are: Creatinine 0.91  I have reviewed the images obtained: MRI brain from earlier today-previously identified enhancing region in the left hemisphere appears grossly stable  Impression: 62 year old male with numbness and  tingling that started in his shoulder moving down over the course of 50 minutes as well as some difficulty speaking.  Possible etiologies include TIA, seizure, migraine aura.  The description including positive symptoms as well as rate of spread over the course of 15 minutes would strongly argue for migraine aura.  He has never had a seizure without twitching, and I think this is less likely.  TIA would be a possibility, but given that he is already scheduled to have a loop recorder placed in a few days as well as already using secondary risk factor modification goals from his previous stroke, I do not know that he would get significant benefit from readmission.  I do think, since he has had no problems with bleeding, that adding Plavix briefly for 3 weeks would be relatively low risk intervention and I would favor doing that at this time.  An MRI could be helpful if diffusion was found to have areas of stroke, as this would lead credence to the possibility of ischemia as etiology.  I would also do a CT angiogram to ensure there is been no interval acute disease.  Recommendations: 1) MRI brain 2) CT angio head and neck 3) if these are negative, then I would favor starting Plavix 75 mg daily for 3 weeks in case this did represent TIA, though my suspicion is low 4) I would continue his home seizure medication regimen 5) if he has significant findings on either MRI or CTA, please call.  Otherwise follow-up with outpatient neurology.   Ritta Slot, MD [facility] [phone]  If 7pm- 7am, please page neurology on call as listed in AMION.

## 2023-04-30 NOTE — ED Provider Notes (Signed)
Case signed out to me by Dr. Charm Barges.  Also discussed with Dr. Amada Jupiter.  Patient to receive CT angio head and neck and MRI diffusion weighted images to rule out acute stroke and other abnormality.  These have been performed and do not show acute abnormality.  Patient therefore will be discharged.  Per Dr. Amada Jupiter, 3 weeks of Plavix, continue current dosing of Keppra.  He has follow-up with his neurologist as an outpatient.   Gilda Crease, MD 04/30/23 [phone]

## 2023-04-30 NOTE — Discharge Instructions (Signed)
Neurology has recommended taking Plavix, and antiplatelet medication, daily for 3 weeks.  This is in addition to your aspirin.  Continue your current dose of Keppra.  Follow-up with your neurologist as is scheduled.

## 2023-05-05 ENCOUNTER — Telehealth: Payer: Self-pay | Admitting: Cardiology

## 2023-05-05 NOTE — Telephone Encounter (Signed)
Pt c/o medication issue:  1. Name of Medication:   clopidogrel (PLAVIX) 75 MG tablet  aspirin 81 MG chewable tablet  atorvastatin (LIPITOR) 40 MG tablet   2. How are you currently taking this medication (dosage and times per day)? As prescribed  3. Are you having a reaction (difficulty breathing--STAT)?   4. What is your medication issue?   Patient stated he has an upcoming procedure and wants to know if he needs to hold his medications or take as normal.

## 2023-05-05 NOTE — Telephone Encounter (Signed)
Call to patient and gave him message. He states understanding.   Per Dr Royann Shivers He does not need to hold the meds. Take as usual.

## 2023-05-05 NOTE — Telephone Encounter (Signed)
He does not need to hold the meds. Take as usual.

## 2023-05-05 NOTE — Telephone Encounter (Signed)
Patient is having loop recorder placement in office on Thursday. He ask does he hold medications (aspirin or Plavix)  Please advise

## 2023-05-07 ENCOUNTER — Ambulatory Visit: Payer: Medicare Other | Attending: Cardiovascular Disease | Admitting: Cardiovascular Disease

## 2023-05-07 ENCOUNTER — Telehealth: Payer: Self-pay | Admitting: Cardiology

## 2023-05-07 DIAGNOSIS — I639 Cerebral infarction, unspecified: Secondary | ICD-10-CM | POA: Diagnosis present

## 2023-05-07 DIAGNOSIS — Z95818 Presence of other cardiac implants and grafts: Secondary | ICD-10-CM | POA: Diagnosis present

## 2023-05-07 MED ORDER — LIDOCAINE-EPINEPHRINE 1 %-1:100000 IJ SOLN
10.0000 mL | Freq: Once | INTRAMUSCULAR | Status: AC
Start: 2023-05-07 — End: 2023-05-07
  Administered 2023-05-07: 10 mL via INTRADERMAL

## 2023-05-07 NOTE — Progress Notes (Signed)
LOOP RECORDER IMPLANT   Procedure report  Procedure performed:  Loop recorder implantation   Reason for procedure:  Cryptogenic stroke Procedure performed by:  Thurmon Fair, MD  Complications:  None  Estimated blood loss:  <5 mL  Medications administered during procedure:  Lidocaine 1% 30 mL locally  Device details:  Medtronic Linq II model number [id number], serial number [id number]  Procedure details:  After the risks and benefits of the procedure were discussed the patient provided informed consent. A timeout was performed (1225h) just before the procedure was started. The patient was prepped and draped in usual sterile fashion. The best location for loop recording had been established by preprocedure mapping to be in the fourth left parasternal intercostal space. Local anesthesia with 1% lidocaine was administered to to the left infraclavicular area. A 3 cm horizontal incision was made and dislocation. Usingelectrocautery and mostly sharp and blunt dissection a pocket was created with careful attention to hemostasis. The pocket was flushed with copious amounts of antibiotic solution.  The device was then carefully inserted in the pocket with care so that there would not be pressure on the incision. The pocket was then closed in layers using 2 layers of 2-0 Vicryl and cutaneous staples after which a sterile dressing was applied.    Thurmon Fair, MD, [facility] [phone] office [phone] pager

## 2023-05-07 NOTE — Patient Instructions (Addendum)
Discharge Instructions for  Loop Recorder Explant/Implant    Follow up: Keep your wound check- MyChart- appointment on Wednesday 05/27/23 at 0900.   If you have any questions or concerns, please call the office at [phone].     Implantable Loop Recorder Placement, Care After This sheet gives you information about how to care for yourself after your procedure. Your health care provider may also give you more specific instructions. If you have problems or questions, contact your health care provider. What can I expect after the procedure? After the procedure, it is common to have: Soreness or discomfort near the incision. Some swelling or bruising near the incision.  Follow these instructions at home: Incision care  Monitor your cardiac device site for redness, swelling, and drainage. Call the device clinic at [phone] if you experience these symptoms or fever/chills.  Keep the large square bandage on your site for 24 hours and then you may remove it yourself. Keep the steri-strips underneath in place.   You may shower after 72 hours / 3 days from your procedure with the steri-strips in place. They will usually fall off on their own, or may be removed after 10 days. Pat dry.   Avoid lotions, ointments, or perfumes over your incision until it is well-healed.  Please do not submerge in water until your site is completely healed.   Your device is MRI compatible.   Remote monitoring is used to monitor your cardiac device from home. This monitoring is scheduled every month by our office. It allows [place] to keep an eye on the function of your device to ensure it is working properly.  If your wound site starts to bleed apply pressure.    For help with the monitor please call [employer] Monitor Support Specialist directly at [phone].    If you have any questions/concerns please call the device clinic at [phone].  Activity  Return to your normal activities.  General  instructions Follow instructions from your health care provider about how to manage your implantable loop recorder and transmit the information. Learn how to activate a recording if this is necessary for your type of device. You may go through a metal detection gate, and you may let someone hold a metal detector over your chest. Show your ID card if needed. Do not have an MRI unless you check with your health care provider first. Take over-the-counter and prescription medicines only as told by your health care provider. Keep all follow-up visits as told by your health care provider. This is important. Contact a health care provider if: You have redness, swelling, or pain around your incision. You have a fever. You have pain that is not relieved by your pain medicine. You have triggered your device because of fainting (syncope) or because of a heartbeat that feels like it is racing, slow, fluttering, or skipping (palpitations). Get help right away if you have: Chest pain. Difficulty breathing. Summary After the procedure, it is common to have soreness or discomfort near the incision. Change your dressing as told by your health care provider. Follow instructions from your health care provider about how to manage your implantable loop recorder and transmit the information. Keep all follow-up visits as told by your health care provider. This is important. This information is not intended to replace advice given to you by your health care provider. Make sure you discuss any questions you have with your health care provider. Document Released: 10/15/2015 Document Revised: 12/19/2017 Document Reviewed: 12/19/2017 Elsevier Patient Education  2020 Elsevier Inc.  Medication Instructions:  No changes *If you need a refill on your cardiac medications before your next appointment, please call your pharmacy*  Follow-Up: At [facility], you and your health needs are our priority.  As part of  our continuing mission to provide you with exceptional heart care, we have created designated Provider Care Teams.  These Care Teams include your primary Cardiologist (physician) and Advanced Practice Providers (APPs -  Physician Assistants and Nurse Practitioners) who all work together to provide you with the care you need, when you need it.  We recommend signing up for the patient portal called "MyChart".  Sign up information is provided on this After Visit Summary.  MyChart is used to connect with patients for Virtual Visits (Telemedicine).  Patients are able to view lab/test results, encounter notes, upcoming appointments, etc.  Non-urgent messages can be sent to your provider as well.   To learn more about what you can do with MyChart, go to [email or web address].

## 2023-05-07 NOTE — Telephone Encounter (Signed)
Patient calling to see if doctor is in the office.  He states he went to "the doctor at [facility] one time and he wasn't in the office when I went'.  Assured patient that doctor was in office.  Confirmed appointment.

## 2023-05-07 NOTE — Progress Notes (Signed)
  Cardiology Office Note:  .   Date:  05/07/2023  ID:  Ralph Simpson, DOB October 13, 1961, MRN [id number] PCP: Lorre Munroe, NP  [facility] Providers Cardiologist:  None    History of Present Illness: .   Ralph Simpson is a 62 y.o. male with a history of bilateral cerebellar infarcts in the March 2024, felt to possibly be cardioembolic.  He has not had documented atrial fibrillation.  He was referred for loop recorder implantation by Dr. Antoine Poche.  He has been on dual antiplatelet therapy with aspirin and clopidogrel as well as statin therapy and has not had recurrent strokes since that time.  He  Additional issues include enhancing lesions in left frontal, right frontal parietal and left occipital regions for which she has been offered brain biopsy, which she has declined.  These were discovered after he experienced seizures in May 2022.  His past medical history significant negative for diabetes, hypertension and known coronary or peripheral atrial disease.  Previous ECG has shown sinus rhythm.  He has normal thyroid function.  The echocardiogram performed in March 2024 showed normal left ventricular systolic function and no significant valvular abnormalities.  ROS: no interval neuro events  Studies Reviewed: .         Risk Assessment/Calculations:          Physical Exam:   VS:  There were no vitals taken for this visit.   Wt Readings from Last 3 Encounters:  03/24/23 190 lb 9.6 oz (86.5 kg)  03/02/23 190 lb (86.2 kg)  02/10/23 187 lb 6.3 oz (85 kg)    GEN: Well nourished, well developed in no acute distress, tall, mild pectus excavatum, somewhat Marfanoid phenotype? NECK: No JVD; No carotid bruits CARDIAC: RRR, no murmurs, rubs, gallops RESPIRATORY:  Clear to auscultation without rales, wheezing or rhonchi  ABDOMEN: Soft, non-tender, non-distended EXTREMITIES:  No edema; No deformity   ASSESSMENT AND PLAN: .   62 year old gentleman with history of cryptogenic stroke  and suspicion for possible cardioembolic source.  He is here for loop recorder implantation.  This procedure has been fully reviewed with the patient and written informed consent has been obtained.        Dispo: proceed with ILR  Signed, Thurmon Fair, MD

## 2023-05-07 NOTE — Telephone Encounter (Signed)
Patient wants to confirm procedure today.

## 2023-05-18 ENCOUNTER — Ambulatory Visit: Payer: Self-pay | Admitting: *Deleted

## 2023-05-18 NOTE — Patient Outreach (Signed)
  Care Coordination   Follow Up Visit Note   05/19/2023 Name: Ralph Simpson MRN: [id number] DOB: 03-14-1961  Ralph Simpson is a 62 y.o. year old male who sees [place], Salvadore Oxford, NP for primary care. I spoke with  Ralph Simpson by phone today.  What matters to the patients health and wellness today?  Continue recovery from stroke, get results from loop recorder to confirm he is not having abnormal heart rhythms.     Goals Addressed             This Visit's Progress    Adequate recovery post stroke   On track    Care Coordination Interventions: Evaluation of current treatment plan related to stroke and patient's adherence to plan as established by provider Advised patient to monitor blood pressure daily and record readings Reviewed medications with patient and discussed affordability and adherence Reviewed scheduled/upcoming provider appointments including neurology follow upon 7/9 Discussed plans with patient for ongoing care management follow up and provided patient with direct contact information for care management team         SDOH assessments and interventions completed:  No     Care Coordination Interventions:  Yes, provided   Interventions Today    Flowsheet Row Most Recent Value  Chronic Disease   Chronic disease during today's visit Other  [stroke]  General Interventions   General Interventions Discussed/Reviewed Doctor Visits, General Interventions Reviewed, Labs  Labs --  [lipid panel on 7/16]  Doctor Visits Discussed/Reviewed Doctor Visits Reviewed, PCP, Specialist  [Cardiology 7/10, neurology 7/18]  PCP/Specialist Visits Compliance with follow-up visit  Education Interventions   Education Provided Provided Education  Provided Verbal Education On Nutrition, Medication, Other  [Discussed loop recorder.  Continues to monitor HR and BP daily, state they both have been stable]       Follow up plan: Follow up call scheduled for 8/1    Encounter Outcome:   Pt. Visit Completed   Kemper Durie, RN, MSN, [facility] Care Management Care Management Coordinator [phone]

## 2023-05-26 ENCOUNTER — Inpatient Hospital Stay: Payer: Medicare Other | Admitting: Neurology

## 2023-05-26 ENCOUNTER — Other Ambulatory Visit: Payer: Self-pay

## 2023-05-26 NOTE — Patient Outreach (Signed)
Telephone outreach to patient to obtain mRS was successfully completed. MRS= 1  Deysy Schabel [facility] Care Management Assistant [phone] 

## 2023-05-27 ENCOUNTER — Ambulatory Visit: Payer: Medicare Other | Attending: Cardiology | Admitting: Cardiovascular Disease

## 2023-05-27 ENCOUNTER — Encounter: Payer: Self-pay | Admitting: Cardiovascular Disease

## 2023-05-27 VITALS — Ht 75.5 in

## 2023-05-27 DIAGNOSIS — Z95818 Presence of other cardiac implants and grafts: Secondary | ICD-10-CM

## 2023-05-27 NOTE — Progress Notes (Signed)
Called patient following loop recorder implantation. Steristrips are off, skin healed, no redness/swelling/drainage or tenderness at the site.

## 2023-06-02 ENCOUNTER — Other Ambulatory Visit: Payer: Medicare Other

## 2023-06-02 DIAGNOSIS — E1169 Type 2 diabetes mellitus with other specified complication: Secondary | ICD-10-CM

## 2023-06-03 LAB — LIPID PANEL
Cholesterol: 113 mg/dL (ref <200)
HDL: 41 mg/dL (ref >=40)
LDL Cholesterol (Calc): 57 mg/dL (calc)
Non-HDL Cholesterol (Calc): 72 mg/dL (calc) (ref <130)
Total CHOL/HDL Ratio: 2.8 (calc) (ref <5.0)
Triglycerides: 70 mg/dL (ref <150)

## 2023-06-05 ENCOUNTER — Telehealth: Payer: Self-pay

## 2023-06-05 NOTE — Telephone Encounter (Signed)
Pt given lab results per notes of Nicki Reaper, NP on 06/03/23. Pt verbalized understanding. Patient verbalized understanding. He would like a copy mailed to his home address on file.     Cholesterol looks great.  Continue atorvastatin.  Written by Lorre Munroe, NP on 06/03/2023  7:56 AM EDT

## 2023-06-05 NOTE — Telephone Encounter (Signed)
Results mailed 

## 2023-06-10 ENCOUNTER — Ambulatory Visit (INDEPENDENT_AMBULATORY_CARE_PROVIDER_SITE_OTHER): Payer: Medicare Other

## 2023-06-10 DIAGNOSIS — I639 Cerebral infarction, unspecified: Secondary | ICD-10-CM

## 2023-06-11 LAB — CUP PACEART REMOTE DEVICE CHECK
Date Time Interrogation Session: 20240724192714
Implantable Pulse Generator Implant Date: 20240620

## 2023-06-18 ENCOUNTER — Ambulatory Visit: Payer: Self-pay | Admitting: *Deleted

## 2023-06-18 NOTE — Patient Outreach (Signed)
  Care Coordination   Follow Up Visit Note   06/18/2023 Name: Ralph Simpson MRN: [id number] DOB: 01-28-1961  Ralph Simpson is a 62 y.o. year old male who sees E[place]y, Salvadore Oxford, NP for primary care. I spoke with  Ralph Simpson by phone today.  What matters to the patients health and wellness today?  Remain free from stroke and seizures    Goals Addressed             This Visit's Progress    Adequate recovery post stroke   On track    Care Coordination Interventions: Evaluation of current treatment plan related to stroke and patient's adherence to plan as established by provider Advised patient to monitor blood pressure daily and record readings Reviewed medications with patient and discussed affordability and adherence Reviewed scheduled/upcoming provider appointments including neurology follow upon 7/9 Discussed plans with patient for ongoing care management follow up and provided patient with direct contact information for care management team         SDOH assessments and interventions completed:  No     Care Coordination Interventions:  Yes, provided   Interventions Today    Flowsheet Row Most Recent Value  Chronic Disease   Chronic disease during today's visit Other, Hypertension (HTN)  General Interventions   General Interventions Discussed/Reviewed General Interventions Reviewed, Doctor Visits, Labs  Labs Hgb A1c every 3 months  [6.5 recently]  Doctor Visits Discussed/Reviewed Doctor Visits Reviewed, Specialist, PCP  [Dentist and cardiology 8/7, cancer center 8/14, [facility] 9/13]  PCP/Specialist Visits Compliance with follow-up visit  Education Interventions   Education Provided Provided Education  Provided Verbal Education On Medication, When to see the doctor, Other  [blood pressure range 100-110s/60s]       Follow up plan: Follow up call scheduled for 9/24    Encounter Outcome:  Pt. Visit Completed   Kemper Durie, RN, MSN, [facility] Care Management Care  Management Coordinator [phone]

## 2023-06-22 ENCOUNTER — Telehealth: Payer: Self-pay | Admitting: Cardiology

## 2023-06-22 NOTE — Telephone Encounter (Signed)
Left message for pt to call back  °

## 2023-06-22 NOTE — Telephone Encounter (Signed)
New Message:      Patient wants t o know when he comes for his appointment on 8--7-24, will the loop  recorder be removed?

## 2023-06-23 NOTE — Progress Notes (Unsigned)
  Cardiology Office Note:   Date:  06/24/2023  ID:  Ralph Simpson, DOB Aug 20, 1961, MRN [id number] PCP: Lorre Munroe, NP  [facility] Providers Cardiologist:  None {  History of Present Illness:   Ralph Simpson is a 62 y.o. male  with a history of bilateral cerebellar infarcts in the March 2024, felt to possibly be cardioembolic.  He has not had documented atrial fibrillation.  He is status post  loop recorder implantation.  Since I last saw him he has had no new cardiovascular complaints.  He walks with a cane mostly because of his hips.  He does have some residual weakness from his stroke.  He is a little bit bothered by the loop device catching on his shirt.  He was not aware that we would be leaving this in.  ROS: As stated in the HPI and negative for all other systems.  Studies Reviewed:    EKG:   NA  Risk Assessment/Calculations:             Physical Exam:   VS:  BP 108/74 (BP Location: Left Arm, Patient Position: Sitting, Cuff Size: Normal)   Pulse 72   Ht 6\' 3"  (1.905 m)   Wt 197 lb (89.4 kg)   SpO2 96%   BMI 24.62 kg/m    Wt Readings from Last 3 Encounters:  06/24/23 197 lb (89.4 kg)  03/24/23 190 lb 9.6 oz (86.5 kg)  03/02/23 190 lb (86.2 kg)     GEN: Well nourished, well developed in no acute distress NECK: No JVD; No carotid bruits CARDIAC: RRR, no murmurs, rubs, gallops RESPIRATORY:  Clear to auscultation without rales, wheezing or rhonchi  ABDOMEN: Soft, non-tender, non-distended EXTREMITIES:  No edema; No deformity   ASSESSMENT AND PLAN:   CVA: He is followed by neurology at [facility].  He had a spinal tap.  We are following his loop monitor and see no atrial fibrillation.   Dyslipidemia: LDL is 57 on statin.  No change in therapy.  He has had an excellent response.        Follow up with me in one year.   Signed, Rollene Rotunda, MD

## 2023-06-23 NOTE — Telephone Encounter (Signed)
Spoke with pt, aware loop recorders are usually not taken out but if that is something is wrong with it or it is bothering him. Patient voiced understanding and will discuss with dr hochrein tomorrow.

## 2023-06-23 NOTE — Progress Notes (Signed)
Carelink Summary Report / Loop Recorder 

## 2023-06-24 ENCOUNTER — Ambulatory Visit: Payer: Medicare Other | Attending: Cardiology | Admitting: Cardiology

## 2023-06-24 ENCOUNTER — Encounter: Payer: Self-pay | Admitting: Cardiology

## 2023-06-24 VITALS — BP 108/74 | HR 72 | Ht 75.0 in | Wt 197.0 lb

## 2023-06-24 DIAGNOSIS — E785 Hyperlipidemia, unspecified: Secondary | ICD-10-CM | POA: Insufficient documentation

## 2023-06-24 DIAGNOSIS — I639 Cerebral infarction, unspecified: Secondary | ICD-10-CM | POA: Insufficient documentation

## 2023-06-24 NOTE — Patient Instructions (Signed)
Medication Instructions:  Continue same medications *If you need a refill on your cardiac medications before your next appointment, please call your pharmacy*   Lab Work: None ordered   Testing/Procedures: None ordered   Follow-Up: At [facility], you and your health needs are our priority.  As part of our continuing mission to provide you with exceptional heart care, we have created designated Provider Care Teams.  These Care Teams include your primary Cardiologist (physician) and Advanced Practice Providers (APPs -  Physician Assistants and Nurse Practitioners) who all work together to provide you with the care you need, when you need it.  We recommend signing up for the patient portal called "MyChart".  Sign up information is provided on this After Visit Summary.  MyChart is used to connect with patients for Virtual Visits (Telemedicine).  Patients are able to view lab/test results, encounter notes, upcoming appointments, etc.  Non-urgent messages can be sent to your provider as well.   To learn more about what you can do with MyChart, go to [email or web address].    Your next appointment:  1 year  Call in April to schedule August appointment     Provider:  Dr.Hochrein

## 2023-06-28 ENCOUNTER — Other Ambulatory Visit: Payer: Self-pay | Admitting: Internal Medicine

## 2023-06-28 DIAGNOSIS — E1165 Type 2 diabetes mellitus with hyperglycemia: Secondary | ICD-10-CM

## 2023-06-30 NOTE — Telephone Encounter (Signed)
Requested medications are due for refill today.  yes  Requested medications are on the active medications list.  no  Last refill. none  Future visit scheduled.   yes  Notes to clinic.  Need rx for these strips.    Requested Prescriptions  Pending Prescriptions Disp Refills   ONETOUCH VERIO test strip [Pharmacy Med Name: ONE TOUCH VERIO TEST STRIP] 100 strip 10    Sig: CHECK BLOOD SUGAR USING 1 STRIP DAILY FOR BLOOD SUGAR MONITORING RELATED TO DM 2 (E11.9)     Endocrinology: Diabetes - Testing Supplies Passed - 06/28/2023  3:35 PM      Passed - Valid encounter within last 12 months    Recent Outpatient Visits           4 months ago Aortic atherosclerosis [facility])   [place] [facility] [place], Salvadore Oxford, NP   10 months ago Aortic atherosclerosis [facility])   [place] [facility] [place], Salvadore Oxford, NP   1 year ago Pruritus   [place] [facility] [place], [place] W, NP   1 year ago Type 2 diabetes mellitus with hyperglycemia, without long-term current use of insulin [facility])   [place] [facility] [place], [place], NP   1 year ago Type 2 diabetes mellitus with hyperglycemia, without long-term current use of insulin [facility])   [place] [facility] [place], Salvadore Oxford, NP       Future Appointments             In 2 months [place], Salvadore Oxford, NP [place] [facility], [facility]

## 2023-07-01 ENCOUNTER — Encounter: Payer: Self-pay | Admitting: Oncology

## 2023-07-01 ENCOUNTER — Inpatient Hospital Stay: Payer: Medicare Other | Attending: Oncology

## 2023-07-01 ENCOUNTER — Inpatient Hospital Stay (HOSPITAL_BASED_OUTPATIENT_CLINIC_OR_DEPARTMENT_OTHER): Payer: Medicare Other | Admitting: Oncology

## 2023-07-01 VITALS — BP 113/81 | HR 75 | Temp 98.3°F | Resp 16 | Ht 75.0 in | Wt 196.7 lb

## 2023-07-01 DIAGNOSIS — D709 Neutropenia, unspecified: Secondary | ICD-10-CM | POA: Insufficient documentation

## 2023-07-01 DIAGNOSIS — D708 Other neutropenia: Secondary | ICD-10-CM

## 2023-07-01 DIAGNOSIS — R7989 Other specified abnormal findings of blood chemistry: Secondary | ICD-10-CM | POA: Insufficient documentation

## 2023-07-01 DIAGNOSIS — M8700 Idiopathic aseptic necrosis of unspecified bone: Secondary | ICD-10-CM

## 2023-07-01 DIAGNOSIS — Z87891 Personal history of nicotine dependence: Secondary | ICD-10-CM | POA: Insufficient documentation

## 2023-07-01 LAB — CBC WITH DIFFERENTIAL/PLATELET
Abs Immature Granulocytes: 0.01 10*3/uL (ref 0.00–0.07)
Basophils Absolute: 0 10*3/uL (ref 0.0–0.1)
Basophils Relative: 1 %
Eosinophils Absolute: 0.1 10*3/uL (ref 0.0–0.5)
Eosinophils Relative: 2 %
HCT: 42.5 % (ref 39.0–52.0)
Hemoglobin: 14.5 g/dL (ref 13.0–17.0)
Immature Granulocytes: 0 %
Lymphocytes Relative: 29 %
Lymphs Abs: 1.2 10*3/uL (ref 0.7–4.0)
MCH: 28.9 pg (ref 26.0–34.0)
MCHC: 34.1 g/dL (ref 30.0–36.0)
MCV: 84.8 fL (ref 80.0–100.0)
Monocytes Absolute: 0.5 10*3/uL (ref 0.1–1.0)
Monocytes Relative: 11 %
Neutro Abs: 2.4 10*3/uL (ref 1.7–7.7)
Neutrophils Relative %: 57 %
Platelets: 173 10*3/uL (ref 150–400)
RBC: 5.01 MIL/uL (ref 4.22–5.81)
RDW: 12.8 % (ref 11.5–15.5)
WBC: 4.1 10*3/uL (ref 4.0–10.5)
nRBC: 0 % (ref 0.0–0.2)

## 2023-07-01 LAB — COMPREHENSIVE METABOLIC PANEL
ALT: 22 U/L (ref 0–44)
AST: 18 U/L (ref 15–41)
Albumin: 4.5 g/dL (ref 3.5–5.0)
Alkaline Phosphatase: 66 U/L (ref 38–126)
Anion gap: 8 (ref 5–15)
BUN: 28 mg/dL — ABNORMAL HIGH (ref 8–23)
CO2: 25 mmol/L (ref 22–32)
Calcium: 9.5 mg/dL (ref 8.9–10.3)
Chloride: 105 mmol/L (ref 98–111)
Creatinine, Ser: 1.41 mg/dL — ABNORMAL HIGH (ref 0.61–1.24)
GFR, Estimated: 56 mL/min — ABNORMAL LOW (ref >60)
Glucose, Bld: 142 mg/dL — ABNORMAL HIGH (ref 70–99)
Potassium: 4.2 mmol/L (ref 3.5–5.1)
Sodium: 138 mmol/L (ref 135–145)
Total Bilirubin: 0.5 mg/dL (ref 0.3–1.2)
Total Protein: 7 g/dL (ref 6.5–8.1)

## 2023-07-01 NOTE — Assessment & Plan Note (Signed)
Chronic leukopenia was predominantly neutropenia, since May 2022.   Today's blood work showed complete normal white blood cell with normal differential. may be secondary to discontinuation of Depakote

## 2023-07-01 NOTE — Progress Notes (Signed)
Hematology/Oncology Consult note  [phone] [phone]   Patient Care Team: Lorre Munroe, NP as PCP - General (Internal Medicine) Rickard Patience, MD as Consulting Physician (Hematology and Oncology) Kemper Durie, RN as [facility] Care Management   CHIEF COMPLAINTS/REASON FOR VISIT:  Leukopenia   ASSESSMENT & PLAN:   Neutropenia, unspecified (HCC) Chronic leukopenia was predominantly neutropenia, since May 2022.   Today's blood work showed complete normal white blood cell with normal differential. may be secondary to discontinuation of Depakote   Elevated serum creatinine Possibly due to recent NSAID use.  Encourage oral hydration and avoid nephrotoxins.  Recommend him to follow up with primary provider and repeat lab in September   No need to follow up routinely.  I recommend patient to continue follow up with primary care physician. Patient may re-establish care in the future if clinically indicated.   All questions were answered. The patient knows to call the clinic with any problems, questions or concerns.  Rickard Patience, MD, PhD [facility] Hematology Oncology 07/01/2023   HISTORY OF PRESENTING ILLNESS:  Ralph Simpson is a 62 y.o. male who was seen in consultation at the request of Lorre Munroe, NP for evaluation of leukopenia.  11/13/2021, CBC showed white count of 2.6, ANC 1.2.  Normal hemoglobin and platelet counts.  Reviewed patient's previous history, patient has normal white count on 04/07/2021, after that he started to have decrease of total white count as well as neutropenia. Patient was hospitalized in May 2022 due to seizure activities.   Patient follows up with neurology Dr. Terrace Arabia.  Currently he is on Depakote as well as lamotrigine. Patient presented to emergency room on 08/04/2021.  Patient had a fever, body aches and headache.  ANC was decreased at 0.9.  Total white count 1.7.  Smear review showed leukopenia with absolute  neutropenia.  Moderate left shift. Today patient reports feeling well.  Denies nausea vomiting diarrhea.    INTERVAL HISTORY Ralph Simpson is a 62 y.o. male who has above history reviewed by me today presents for follow up visit for leukopenia.   Patient follows up with neurology for seizure management.  He has been off Depakote and currently on keppra.  Uses NSAID for muscle/joint pain.    Review of Systems  Constitutional:  Negative for appetite change, chills, fatigue, fever and unexpected weight change.  HENT:   Negative for hearing loss and voice change.   Eyes:  Negative for eye problems and icterus.  Respiratory:  Negative for chest tightness, cough and shortness of breath.   Cardiovascular:  Negative for chest pain and leg swelling.  Gastrointestinal:  Negative for abdominal distention and abdominal pain.  Endocrine: Negative for hot flashes.  Genitourinary:  Negative for difficulty urinating, dysuria and frequency.   Musculoskeletal:  Negative for arthralgias.  Skin:  Negative for itching and rash.  Neurological:  Negative for light-headedness and numbness.  Hematological:  Negative for adenopathy. Does not bruise/bleed easily.  Psychiatric/Behavioral:  Negative for confusion.     MEDICAL HISTORY:  Past Medical History:  Diagnosis Date   Allergy    Avascular necrosis of bones of both hips (HCC)    GERD (gastroesophageal reflux disease)    Migraine    Neutropenia (HCC)    Osteoarthritis of both hips    Plantar fasciitis    Seizures (HCC)    x 2 03/2021   Trochanteric bursitis of right hip     SURGICAL HISTORY: Past Surgical History:  Procedure  Laterality Date   HERNIA REPAIR     HIP SURGERY     IR ANGIO INTRA EXTRACRAN SEL COM CAROTID INNOMINATE BILAT MOD SED  04/08/2021   IR ANGIO VERTEBRAL SEL SUBCLAVIAN INNOMINATE BILAT MOD SED  04/08/2021   IR ANGIO VERTEBRAL SEL VERTEBRAL UNI R MOD SED  02/10/2023   IR CT HEAD LTD  03/30/2023   IR PERCUTANEOUS ART  THROMBECTOMY/INFUSION INTRACRANIAL INC DIAG ANGIO  02/10/2023   IR US GUIDE VASC ACCESS RIGHT  04/08/2021   RADIOLOGY WITH ANESTHESIA N/A 02/10/2023   Procedure: IR WITH ANESTHESIA;  Surgeon: Radiologist, Medication, MD;  Location: [facility] OR;  Service: Radiology;  Laterality: N/A;    SOCIAL HISTORY: Social History   Socioeconomic History   Marital status: Single    Spouse name: Not on file   Number of children: 0   Years of education: Not on file   Highest education level: 10th grade  Occupational History    Comment: NA  Tobacco Use   Smoking status: Former    Current packs/day: 1.00    Types: Cigarettes    Passive exposure: Never   Smokeless tobacco: Former    Types: Chew   Tobacco comments:    quit 10-15 years ago  Vaping Use   Vaping status: Never Used  Substance and Sexual Activity   Alcohol use: Not Currently   Drug use: No   Sexual activity: Not Currently  Other Topics Concern   Not on file  Social History Narrative   Not on file   Social Determinants of Health   Financial Resource Strain: Low Risk  (07/25/2022)   Overall Financial Resource Strain (CARDIA)    Difficulty of Paying Living Expenses: Not hard at all  Food Insecurity: Low Risk  (04/27/2023)   [facility]   Food vital sign    Within the past 12 months, you worried that your food would run out before you got money to buy more: Never true    Within the past 12 months, the food you bought just didn't last and you didn't have money to get more. : Never true  Transportation Needs: No Transportation Needs (04/27/2023)   [facility]   Transportation    In the past 12 months, has lack of reliable transportation kept you from medical appointments, meetings, work or from getting things needed for daily living? : No  Physical Activity: Inactive (07/25/2022)   Exercise Vital Sign    Days of Exercise per Week: 0 days    Minutes of Exercise per Session: 0 min   Stress: No Stress Concern Present (07/25/2022)   [employer] of Occupational Health - Occupational Stress Questionnaire    Feeling of Stress : Not at all  Social Connections: Socially Isolated (07/25/2022)   Social Connection and Isolation Panel [NHANES]    Frequency of Communication with Friends and Family: More than three times a week    Frequency of Social Gatherings with Friends and Family: More than three times a week    Attends Religious Services: Never    Database administrator or Organizations: No    Attends Banker Meetings: Never    Marital Status: Never married  Intimate Partner Violence: Not At Risk (02/10/2023)   Humiliation, Afraid, Rape, and Kick questionnaire    Fear of Current or Ex-Partner: No    Emotionally Abused: No    Physically Abused: No    Sexually Abused: No  FAMILY HISTORY: Family History  Problem Relation Age of Onset   Arthritis Mother    Stroke Mother    Diabetes Father    Heart disease Father    Hyperlipidemia Father    Hypertension Father    Kidney disease Maternal Grandfather    Colon cancer Neg Hx    Stomach cancer Neg Hx    Rectal cancer Neg Hx     ALLERGIES:  is allergic to aspirin.  MEDICATIONS:  Current Outpatient Medications  Medication Sig Dispense Refill   aspirin 81 MG chewable tablet Chew 1 tablet (81 mg total) by mouth daily. 21 tablet 0   atorvastatin (LIPITOR) 40 MG tablet Take 1 tablet (40 mg total) by mouth daily. 90 tablet 1   Blood Glucose Monitoring Suppl (ONETOUCH VERIO) w/Device KIT Used to check blood sugar once a day. DX E11.9 1 kit 0   Lancet Devices (RELION LANCING DEVICE) MISC Use to check blood sugar one a day.  DX: E11.9 100 each 1   ONETOUCH VERIO test strip CHECK BLOOD SUGAR USING 1 STRIP DAILY FOR BLOOD SUGAR MONITORING RELATED TO DM 2 (E11.9) 100 strip 10   Rimegepant Sulfate (NURTEC) 75 MG TBDP TAKE 1 TAB AT ONSET OF MIGRAINE. MAY REPEAT IN 2 HRS, IF NEEDED. MAX DOSE: 2 TABS/DAY. 8 tablet 2    clopidogrel (PLAVIX) 75 MG tablet Take 1 tablet (75 mg total) by mouth daily. (Patient not taking: Reported on 06/24/2023) 21 tablet 0   levETIRAcetam (KEPPRA) 500 MG tablet Take 1 tablet (500 mg total) by mouth 2 (two) times daily. (Patient taking differently: Take 500 mg by mouth 2 (two) times daily. Take 3 tablets by mouth 2 times daily) 60 tablet 6   No current facility-administered medications for this visit.     PHYSICAL EXAMINATION: ECOG PERFORMANCE STATUS: 0 - Asymptomatic Vitals:   07/01/23 1457  BP: 113/81  Pulse: 75  Resp: 16  Temp: 98.3 F (36.8 C)  SpO2: 95%   Filed Weights   07/01/23 1457  Weight: 196 lb 11.2 oz (89.2 kg)    Physical Exam Constitutional:      General: He is not in acute distress. HENT:     Head: Normocephalic and atraumatic.  Eyes:     General: No scleral icterus. Cardiovascular:     Rate and Rhythm: Normal rate and regular rhythm.     Heart sounds: Normal heart sounds.  Pulmonary:     Effort: Pulmonary effort is normal. No respiratory distress.     Breath sounds: No wheezing.  Abdominal:     General: Bowel sounds are normal. There is no distension.     Palpations: Abdomen is soft.  Musculoskeletal:        General: No deformity. Normal range of motion.     Cervical back: Normal range of motion and neck supple.  Skin:    General: Skin is warm and dry.     Findings: No erythema or rash.  Neurological:     Mental Status: He is alert and oriented to person, place, and time. Mental status is at baseline.     Cranial Nerves: No cranial nerve deficit.     Coordination: Coordination normal.  Psychiatric:        Mood and Affect: Mood normal.       LABORATORY DATA:  I have reviewed the data as listed    Latest Ref Rng & Units 07/01/2023    2:25 PM 04/29/2023   10:29 PM 04/29/2023   10:16  PM  CBC  WBC 4.0 - 10.5 K/uL 4.1   3.9   Hemoglobin 13.0 - 17.0 g/dL 59.5  63.8  75.6   Hematocrit 39.0 - 52.0 % 42.5  43.0  44.3   Platelets 150  - 400 K/uL 173   171        Latest Ref Rng & Units 07/01/2023    2:25 PM 04/29/2023   10:29 PM 04/29/2023   10:16 PM  CMP  Glucose 70 - 99 mg/dL 433  295  188   BUN 8 - 23 mg/dL 28  22  20    Creatinine 0.61 - 1.24 mg/dL 4.16  6.06  3.01   Sodium 135 - 145 mmol/L 138  140  136   Potassium 3.5 - 5.1 mmol/L 4.2  3.8  3.9   Chloride 98 - 111 mmol/L 105  104  103   CO2 22 - 32 mmol/L 25   24   Calcium 8.9 - 10.3 mg/dL 9.5   9.0   Total Protein 6.5 - 8.1 g/dL 7.0   6.7   Total Bilirubin 0.3 - 1.2 mg/dL 0.5   0.4   Alkaline Phos 38 - 126 U/L 66   66   AST 15 - 41 U/L 18   19   ALT 0 - 44 U/L 22   25     RADIOGRAPHIC STUDIES: I have personally reviewed the radiological images as listed and agreed with the findings in the report. CUP PACEART REMOTE DEVICE CHECK  Result Date: 06/11/2023 ILR summary report received. Battery status OK. Normal device function. No new symptom, tachy, brady, or pause episodes. No new AF episodes. Monthly summary reports and ROV/PRN LA, CVRS  MR BRAIN W WO CONTRAST  Result Date: 05/08/2023 CLINICAL DATA:  Seizures in stroke March 2024. Patient feels dizzy and fatigue. EXAM: MRI HEAD WITHOUT AND WITH CONTRAST TECHNIQUE: Multiplanar, multiecho pulse sequences of the brain and surrounding structures were obtained without and with intravenous contrast. CONTRAST:  8mL GADAVIST GADOBUTROL 1 MMOL/ML IV SOLN COMPARISON:  MR head without contrast 02/11/2023 FINDINGS: Brain: Subtle diffusion trace signal adjacent to the remote infarct of the right thalamus is likely artifactual. No significant acute infarct is present. No hemorrhage or mass lesion is present. T2 and FLAIR hyperintensities are associated with the remote right thalamic infarct. Periventricular and subcortical T2 hyperintensities are otherwise stable. A remote lacunar infarct is again noted in the left corona radiata. The ventricles are of normal size. No significant extraaxial fluid collection is present. Stable  remote lacunar infarcts are present within the cerebellum bilaterally. Brainstem is unremarkable. The internal auditory canals are within normal limits. Midline structures are within normal limits. Vascular: Flow is present in the major intracranial arteries. Skull and upper cervical spine: The craniocervical junction is normal. Upper cervical spine is within normal limits. Marrow signal is unremarkable. Sinuses/Orbits: Left lens replacement is present. Globes and orbits are otherwise within normal limits. The paranasal sinuses and mastoid air cells are clear. IMPRESSION: 1. No acute intracranial abnormality or significant interval change. 2. Remote right thalamic infarct. 3. Stable remote lacunar infarcts of the cerebellum bilaterally. 4. Stable atrophy and white matter disease likely reflects the sequela of chronic microvascular ischemia. Electronically Signed   By: Marin Roberts M.D.   On: 05/08/2023 16:44   CT ANGIO HEAD NECK W WO CM  Result Date: 04/30/2023 CLINICAL DATA:  Initial evaluation for TIA. EXAM: CT ANGIOGRAPHY HEAD AND NECK WITH AND WITHOUT CONTRAST TECHNIQUE: Multidetector CT imaging of  the head and neck was performed using the standard protocol during bolus administration of intravenous contrast. Multiplanar CT image reconstructions and MIPs were obtained to evaluate the vascular anatomy. Carotid stenosis measurements (when applicable) are obtained utilizing NASCET criteria, using the distal internal carotid diameter as the denominator. RADIATION DOSE REDUCTION: This exam was performed according to the departmental dose-optimization program which includes automated exposure control, adjustment of the mA and/or kV according to patient size and/or use of iterative reconstruction technique. CONTRAST:  75mL OMNIPAQUE IOHEXOL 350 MG/ML SOLN COMPARISON:  Prior MRI from earlier the same day as well as prior CTA from 02/10/2023. FINDINGS: CT HEAD FINDINGS Brain: Cerebral volume within normal  limits. Chronic lacunar infarct at the right thalamocapsular region, new from prior CT, and consistent with previously identified PCA occlusion. No other acute large vessel territory infarct. No acute intracranial hemorrhage. Subtle sulcal effacement with hyperdensity seen at the high left frontal lobe (series 4, image 27). This corresponds with the abnormal leptomeningeal process in this region, better characterized on prior brain MRI. No other mass lesion, mass effect or midline shift. No hydrocephalus or extra-axial fluid collection. Vascular: No abnormal hyperdense vessel. Skull: Scalp soft tissues and calvarium demonstrate no acute finding. Sinuses/Orbits: Globes orbital soft tissues within normal limits. Prior ocular lens replacement on the left. Paranasal sinuses and mastoid air cells are clear. Other: None. Review of the MIP images confirms the above findings CTA NECK FINDINGS Aortic arch: Standard branching. Imaged portion shows no evidence of aneurysm or dissection. No significant stenosis of the major arch vessel origins. Right carotid system: No evidence of dissection, stenosis (50% or greater), or occlusion. Left carotid system: No evidence of dissection, stenosis (50% or greater), or occlusion. Vertebral arteries: Both vertebral arteries arise from subclavian arteries. No proximal subclavian artery stenosis. Left vertebral artery slightly dominant. Vertebral arteries patent without stenosis or dissection. Skeleton: No discrete or worrisome osseous lesions. Mild spondylosis for age. Other neck: No other acute abnormality within the neck. Upper chest: Emphysema.  No other acute finding. Review of the MIP images confirms the above findings CTA HEAD FINDINGS Anterior circulation: Both internal carotid arteries widely patent to the termini without stenosis. A1 segments widely patent. Normal anterior communicating artery complex. Both anterior cerebral arteries widely patent to their distal aspects without  stenosis. No M1 stenosis or occlusion. Normal MCA bifurcations. Distal MCA branches well perfused and symmetric. Posterior circulation: Both V4 segments patent without stenosis. Both PICA patent. Basilar widely patent without stenosis. Superior cerebellar and posterior cerebral arteries widely patent bilaterally. Venous sinuses: Patent allowing for timing the contrast bolus. Anatomic variants: None significant. Subtle leptomeningeal enhancement present at the high left frontal lobe, corresponding with abnormality on prior brain MRI (series 6, image 45). No intracranial aneurysm. Review of the MIP images confirms the above findings IMPRESSION: 1. Negative CTA of the head and neck. No large vessel occlusion or other emergent finding. No significant atheromatous disease or hemodynamically significant stenosis. 2. Subtle sulcal effacement with hyperdensity and leptomeningeal enhancement at the high left frontal lobe, corresponding with the abnormality on prior brain MRI. 3. Chronic lacunar infarct at the right thalamocapsular region, in keeping with the previously identified PCA occlusion. Right PCA now widely patent status post revascularization. 4. No other acute intracranial abnormality. Electronically Signed   By: Rise Mu M.D.   On: 04/30/2023 01:56   MR BRAIN WO CONTRAST  Result Date: 04/30/2023 CLINICAL DATA:  Initial evaluation for neuro deficit, stroke suspected. EXAM: MRI HEAD WITHOUT CONTRAST TECHNIQUE:  Multiplanar, multiecho pulse sequences of the brain and surrounding structures were obtained without intravenous contrast. COMPARISON:  Comparison made with prior MRI from earlier the same day. FINDINGS: Brain: Limited diffusion-weighted imaging of the brain was performed to evaluate for stroke. Diffusion-weighted sequences demonstrate no acute or subacute ischemia. Note is made of a small subcentimeter focus of apparent diffusion signal abnormality at the level of the right thalamus (series 5,  image 78). No convincing ADC correlate. Additionally, this is located along the margin of a chronic right thalamic lacunar infarct. Given this, this is favored to be artifactual. Otherwise, no other visible acute intracranial abnormality. No visible mass lesion, mass effect, or midline shift. No hydrocephalus or extra-axial fluid collection. Vascular: Not assessed on this limited exam. Skull and upper cervical spine: Not assessed on this limited exam. Sinuses/Orbits: Not assessed on this limited exam. Other: None. IMPRESSION: 1. Limited brain MRI with diffusion-weighted imaging only performed. No acute or subacute infarct. 2. Small focus of apparent diffusion signal abnormality at the right thalamus, located along the margin of a chronic right thalamic lacunar infarct, and favored to be artifactual. Electronically Signed   By: Rise Mu M.D.   On: 04/30/2023 01:27   08/26/2021, ultrasound abdomen limited right upper quadrant showed cholelithiasis without sonographic evidence of acute cholecystitis.

## 2023-07-01 NOTE — Assessment & Plan Note (Addendum)
Possibly due to recent NSAID use.  Encourage oral hydration and avoid nephrotoxins.  Recommend him to follow up with primary provider and repeat lab in September

## 2023-07-13 ENCOUNTER — Ambulatory Visit (INDEPENDENT_AMBULATORY_CARE_PROVIDER_SITE_OTHER): Payer: Medicare Other

## 2023-07-13 DIAGNOSIS — I639 Cerebral infarction, unspecified: Secondary | ICD-10-CM | POA: Diagnosis not present

## 2023-07-14 LAB — CUP PACEART REMOTE DEVICE CHECK
Date Time Interrogation Session: 20240826192715
Implantable Pulse Generator Implant Date: 20240620

## 2023-07-22 NOTE — Progress Notes (Signed)
Carelink Summary Report / Loop Recorder 

## 2023-07-31 ENCOUNTER — Other Ambulatory Visit: Payer: Self-pay | Admitting: Internal Medicine

## 2023-07-31 ENCOUNTER — Ambulatory Visit (INDEPENDENT_AMBULATORY_CARE_PROVIDER_SITE_OTHER): Payer: Medicare Other

## 2023-07-31 VITALS — BP 102/64 | Ht 75.0 in | Wt 197.6 lb

## 2023-07-31 DIAGNOSIS — Z0000 Encounter for general adult medical examination without abnormal findings: Secondary | ICD-10-CM | POA: Diagnosis not present

## 2023-07-31 MED ORDER — RELION BLOOD GLUCOSE TEST VI STRP: ORAL_STRIP | 12 refills | Status: DC

## 2023-07-31 NOTE — Patient Instructions (Addendum)
Ralph Simpson , Thank you for taking time to come for your Medicare Wellness Visit. I appreciate your ongoing commitment to your health goals. Please review the following plan we discussed and let me know if I can assist you in the future.   Referrals/Orders/Follow-Ups/Clinician Recommendations: none  This is a list of the screening recommended for you and due dates:  Health Maintenance  Topic Date Due   Complete foot exam   11/15/2022   Eye exam for diabetics  01/02/2023   Flu Shot  Never done   Hemoglobin A1C  08/14/2023   Yearly kidney health urinalysis for diabetes  03/01/2024   Yearly kidney function blood test for diabetes  06/30/2024   Medicare Annual Wellness Visit  07/30/2024   Colon Cancer Screening  07/29/2028   DTaP/Tdap/Td vaccine (2 - Td or Tdap) 11/14/2031   Hepatitis C Screening  Completed   HIV Screening  Completed   Zoster (Shingles) Vaccine  Completed   HPV Vaccine  Aged Out   COVID-19 Vaccine  Discontinued    Advanced directives: (ACP Link)Information on Advanced Care Planning can be found at [facility] of [place] Advance Health Care Directives Advance Health Care Directives ([email or web address]   Next Medicare Annual Wellness Visit scheduled for next year: Yes   08/05/24 @ 8:15 am in person

## 2023-07-31 NOTE — Progress Notes (Signed)
Subjective:   Ralph Simpson is a 62 y.o. male who presents for Medicare Annual/Subsequent preventive examination.  Visit Complete: In person   Cardiac Risk Factors include: advanced age (>70men, >61 women);diabetes mellitus;dyslipidemia;male gender;sedentary lifestyle     Objective:    Today's Vitals   07/31/23 0848 07/31/23 0853  BP: 102/64   Weight: 197 lb 9.6 oz (89.6 kg)   Height: 6\' 3"  (1.905 m)   PainSc:  5    Body mass index is 24.7 kg/m.     07/31/2023    9:05 AM 07/01/2023    2:48 PM 02/10/2023    4:15 PM 11/15/2022   12:58 PM 07/25/2022    8:57 AM 06/30/2022    2:35 PM 12/30/2021    2:55 PM  Advanced Directives  Does Patient Have a Medical Advance Directive? No No No No No No No  Would patient like information on creating a medical advance directive? No - Patient declined No - Patient declined No - Patient declined No - Patient declined No - Patient declined  No - Patient declined    Current Medications (verified) Outpatient Encounter Medications as of 07/31/2023  Medication Sig   aspirin 81 MG chewable tablet Chew 1 tablet (81 mg total) by mouth daily.   atorvastatin (LIPITOR) 40 MG tablet Take 1 tablet (40 mg total) by mouth daily.   Blood Glucose Monitoring Suppl (ONETOUCH VERIO) w/Device KIT Used to check blood sugar once a day. DX E11.9   clonazePAM (KLONOPIN) 1 MG tablet Take 1 mg by mouth 2 (two) times daily.   Lancet Devices (RELION LANCING DEVICE) MISC Use to check blood sugar one a day.  DX: E11.9   levETIRAcetam (KEPPRA) 500 MG tablet Take 1 tablet (500 mg total) by mouth 2 (two) times daily. (Patient taking differently: Take 500 mg by mouth 2 (two) times daily. Take 3 tablets by mouth 2 times daily)   ONETOUCH VERIO test strip CHECK BLOOD SUGAR USING 1 STRIP DAILY FOR BLOOD SUGAR MONITORING RELATED TO DM 2 (E11.9)   Rimegepant Sulfate (NURTEC) 75 MG TBDP TAKE 1 TAB AT ONSET OF MIGRAINE. MAY REPEAT IN 2 HRS, IF NEEDED. MAX DOSE: 2 TABS/DAY.    clopidogrel (PLAVIX) 75 MG tablet Take 1 tablet (75 mg total) by mouth daily. (Patient not taking: Reported on 06/24/2023)   No facility-administered encounter medications on file as of 07/31/2023.    Allergies (verified) Aspirin   History: Past Medical History:  Diagnosis Date   Allergy    Avascular necrosis of bones of both hips (HCC)    GERD (gastroesophageal reflux disease)    Migraine    Neutropenia (HCC)    Osteoarthritis of both hips    Plantar fasciitis    Seizures (HCC)    x 2 03/2021   Trochanteric bursitis of right hip    Past Surgical History:  Procedure Laterality Date   HERNIA REPAIR     HIP SURGERY     IR ANGIO INTRA EXTRACRAN SEL COM CAROTID INNOMINATE BILAT MOD SED  04/08/2021   IR ANGIO VERTEBRAL SEL SUBCLAVIAN INNOMINATE BILAT MOD SED  04/08/2021   IR ANGIO VERTEBRAL SEL VERTEBRAL UNI R MOD SED  02/10/2023   IR CT HEAD LTD  03/30/2023   IR PERCUTANEOUS ART THROMBECTOMY/INFUSION INTRACRANIAL INC DIAG ANGIO  02/10/2023   IR US GUIDE VASC ACCESS RIGHT  04/08/2021   RADIOLOGY WITH ANESTHESIA N/A 02/10/2023   Procedure: IR WITH ANESTHESIA;  Surgeon: Radiologist, Medication, MD;  Location: [facility] OR;  Service: Radiology;  Laterality: N/A;   Family History  Problem Relation Age of Onset   Arthritis Mother    Stroke Mother    Diabetes Father    Heart disease Father    Hyperlipidemia Father    Hypertension Father    Kidney disease Maternal Grandfather    Colon cancer Neg Hx    Stomach cancer Neg Hx    Rectal cancer Neg Hx    Social History   Socioeconomic History   Marital status: Single    Spouse name: Not on file   Number of children: 0   Years of education: Not on file   Highest education level: 10th grade  Occupational History    Comment: NA  Tobacco Use   Smoking status: Former    Current packs/day: 1.00    Types: Cigarettes    Passive exposure: Never   Smokeless tobacco: Former    Types: Chew   Tobacco comments:    quit 10-15 years ago  Vaping Use    Vaping status: Never Used  Substance and Sexual Activity   Alcohol use: Not Currently   Drug use: No   Sexual activity: Not Currently  Other Topics Concern   Not on file  Social History Narrative   Not on file   Social Determinants of Health   Financial Resource Strain: Low Risk  (07/31/2023)   Overall Financial Resource Strain (CARDIA)    Difficulty of Paying Living Expenses: Not very hard  Food Insecurity: No Food Insecurity (07/31/2023)   Hunger Vital Sign    Worried About Running Out of Food in the Last Year: Never true    Ran Out of Food in the Last Year: Never true  Transportation Needs: No Transportation Needs (07/31/2023)   PRAPARE - Administrator, Civil Service (Medical): No    Lack of Transportation (Non-Medical): No  Physical Activity: Insufficiently Active (07/31/2023)   Exercise Vital Sign    Days of Exercise per Week: 2 days    Minutes of Exercise per Session: 20 min  Stress: No Stress Concern Present (07/31/2023)   [employer] of Occupational Health - Occupational Stress Questionnaire    Feeling of Stress : Only a little  Social Connections: Moderately Isolated (07/31/2023)   Social Connection and Isolation Panel [NHANES]    Frequency of Communication with Friends and Family: More than three times a week    Frequency of Social Gatherings with Friends and Family: Once a week    Attends Religious Services: Never    Database administrator or Organizations: Yes    Attends Banker Meetings: Never    Marital Status: Never married    Tobacco Counseling Counseling given: Not Answered Tobacco comments: quit 10-15 years ago   Clinical Intake:  Pre-visit preparation completed: Yes  Pain : 0-10 Pain Score: 5  Pain Type: Chronic pain Pain Location: Hip Pain Orientation: Right, Left Pain Descriptors / Indicators: Aching Pain Onset: More than a month ago Pain Frequency: Constant Pain Relieving Factors: shift on feet or in  chair  Pain Relieving Factors: shift on feet or in chair  Nutritional Status: BMI of 19-24  Normal Nutritional Risks: None Diabetes: Yes CBG done?: No Did pt. bring in CBG monitor from home?: No  How often do you need to have someone help you when you read instructions, pamphlets, or other written materials from your doctor or pharmacy?: 1 - Never  Interpreter Needed?: No  Information entered by :: Kennedy Bucker, LPN  Activities of Daily Living    07/31/2023    9:07 AM 02/10/2023    4:15 PM  In your present state of health, do you have any difficulty performing the following activities:  Hearing? 0 0  Vision? 0 0  Difficulty concentrating or making decisions? 0 0  Walking or climbing stairs? 1 0  Comment hips   Dressing or bathing? 0 0  Doing errands, shopping? 0 0  Preparing Food and eating ? N   Using the Toilet? N   In the past six months, have you accidently leaked urine? N   Do you have problems with loss of bowel control? N   Managing your Medications? N   Managing your Finances? N   Housekeeping or managing your Housekeeping? N     Patient Care Team: Lorre Munroe, NP as PCP - General (Internal Medicine) Rickard Patience, MD as Consulting Physician (Hematology and Oncology) Kemper Durie, RN as [facility] Care Management  Indicate any recent Medical Services you may have received from other than [facility] providers in the past year (date may be approximate).     Assessment:   This is a routine wellness examination for Roley.  Hearing/Vision screen Hearing Screening - Comments:: No aids Vision Screening - Comments:: Readers-    Goals Addressed             This Visit's Progress    DIET - INCREASE WATER INTAKE         Depression Screen    07/31/2023    9:03 AM 07/25/2022    8:55 AM 06/12/2022   11:09 AM 08/09/2021    4:15 PM 03/05/2020    9:37 AM 01/27/2020    3:42 PM 01/23/2017    5:20 PM  PHQ 2/9 Scores  PHQ - 2 Score 1 1 0 0 0 0 0  PHQ- 9  Score  3 9 0 2      Fall Risk    07/31/2023    9:06 AM 07/25/2022    8:58 AM 06/12/2022   11:09 AM 03/05/2020    9:37 AM 01/23/2017    5:20 PM  Fall Risk   Falls in the past year? 0 1 0 0 No  Number falls in past yr: 0 1 0    Injury with Fall? 0 0 0    Risk for fall due to : No Fall Risks No Fall Risks;Medication side effect No Fall Risks    Follow up Falls prevention discussed;Falls evaluation completed Falls prevention discussed;Falls evaluation completed Falls evaluation completed      MEDICARE RISK AT HOME: Medicare Risk at Home Any stairs in or around the home?: Yes If so, are there any without handrails?: No Home free of loose throw rugs in walkways, pet beds, electrical cords, etc?: Yes Adequate lighting in your home to reduce risk of falls?: Yes Life alert?: No Use of a cane, walker or w/c?: Yes (cane) Grab bars in the bathroom?: No Shower chair or bench in shower?: Yes Elevated toilet seat or a handicapped toilet?: No  TIMED UP AND GO:  Was the test performed?  Yes  Length of time to ambulate 10 feet: 6 sec Gait slow and steady with assistive device    Cognitive Function:      04/28/2022    3:00 PM  Montreal Cognitive Assessment   Visuospatial/ Executive (0/5) 4  Naming (0/3) 3  Attention: Read list of digits (0/2) 2  Attention: Read list of letters (0/1) 1  Attention: Serial 7 subtraction starting at 100 (0/3) 3  Language: Repeat phrase (0/2) 2  Language : Fluency (0/1) 0  Abstraction (0/2) 1  Delayed Recall (0/5) 1  Orientation (0/6) 6  Total 23      07/31/2023    9:08 AM  6CIT Screen  What Year? 0 points  What month? 0 points  What time? 0 points  Count back from 20 0 points  Months in reverse 4 points  Repeat phrase 4 points  Total Score 8 points    Immunizations Immunization History  Administered Date(s) Administered   Tdap 11/13/2021   Zoster Recombinant(Shingrix) 11/13/2021, 02/13/2022    TDAP status: Up to date  Flu Vaccine status:  Declined, Education has been provided regarding the importance of this vaccine but patient still declined. Advised may receive this vaccine at local pharmacy or Health Dept. Aware to provide a copy of the vaccination record if obtained from local pharmacy or Health Dept. Verbalized acceptance and understanding.  Pneumococcal vaccine status: Declined,  Education has been provided regarding the importance of this vaccine but patient still declined. Advised may receive this vaccine at local pharmacy or Health Dept. Aware to provide a copy of the vaccination record if obtained from local pharmacy or Health Dept. Verbalized acceptance and understanding.   Covid-19 vaccine status: Declined, Education has been provided regarding the importance of this vaccine but patient still declined. Advised may receive this vaccine at local pharmacy or Health Dept.or vaccine clinic. Aware to provide a copy of the vaccination record if obtained from local pharmacy or Health Dept. Verbalized acceptance and understanding.  Qualifies for Shingles Vaccine? Yes   Zostavax completed No   Shingrix Completed?: Yes  Screening Tests Health Maintenance  Topic Date Due   FOOT EXAM  11/15/2022   OPHTHALMOLOGY EXAM  01/02/2023   INFLUENZA VACCINE  Never done   HEMOGLOBIN A1C  08/14/2023   Diabetic kidney evaluation - Urine ACR  03/01/2024   Diabetic kidney evaluation - eGFR measurement  06/30/2024   Medicare Annual Wellness (AWV)  07/30/2024   Colonoscopy  07/29/2028   DTaP/Tdap/Td (2 - Td or Tdap) 11/14/2031   Hepatitis C Screening  Completed   HIV Screening  Completed   Zoster Vaccines- Shingrix  Completed   HPV VACCINES  Aged Out   COVID-19 Vaccine  Discontinued    Health Maintenance  Health Maintenance Due  Topic Date Due   FOOT EXAM  11/15/2022   OPHTHALMOLOGY EXAM  01/02/2023   INFLUENZA VACCINE  Never done    Colorectal cancer screening: Type of screening: Colonoscopy. Completed 07/29/21. Repeat every 7  years  Lung Cancer Screening: (Low Dose CT Chest recommended if Age 65-80 years, 20 pack-year currently smoking OR have quit w/in 15years.) does not qualify.    Additional Screening:  Hepatitis C Screening: does qualify; Completed 08/12/21  Vision Screening: Recommended annual ophthalmology exams for early detection of glaucoma and other disorders of the eye. Is the patient up to date with their annual eye exam?  No  Who is the provider or what is the name of the office in which the patient attends annual eye exams? No one If pt is not established with a provider, would they like to be referred to a provider to establish care? No .   Dental Screening: Recommended annual dental exams for proper oral hygiene  Diabetic Foot Exam: Diabetic Foot Exam: Overdue, Pt has been advised about the importance in completing this exam. Pt is scheduled for diabetic foot  exam on 00.  Community Resource Referral / Chronic Care Management: CRR required this visit?  No   CCM required this visit?  No     Plan:     I have personally reviewed and noted the following in the patient's chart:   Medical and social history Use of alcohol, tobacco or illicit drugs  Current medications and supplements including opioid prescriptions. Patient is not currently taking opioid prescriptions. Functional ability and status Nutritional status Physical activity Advanced directives List of other physicians Hospitalizations, surgeries, and ER visits in previous 12 months Vitals Screenings to include cognitive, depression, and falls Referrals and appointments  In addition, I have reviewed and discussed with patient certain preventive protocols, quality metrics, and best practice recommendations. A written personalized care plan for preventive services as well as general preventive health recommendations were provided to patient.     Hal Hope, LPN   04/14/4131   After Visit Summary: my chart  Nurse Notes:  none

## 2023-08-02 ENCOUNTER — Other Ambulatory Visit: Payer: Self-pay | Admitting: Internal Medicine

## 2023-08-04 NOTE — Telephone Encounter (Signed)
Requested Prescriptions  Pending Prescriptions Disp Refills   Blood Glucose Monitoring Suppl (ONETOUCH VERIO FLEX SYSTEM) w/Device KIT [Pharmacy Med Name: ONE TOUCH VERIO FLEX SYST KIT] 1 kit 0    Sig: USED TO CHECK BLOOD SUGAR ONCE A DAY. DX E11.9     Endocrinology: Diabetes - Testing Supplies Passed - 08/02/2023  4:37 PM      Passed - Valid encounter within last 12 months    Recent Outpatient Visits           5 months ago Aortic atherosclerosis [facility])   [place] [facility] [place], Salvadore Oxford, NP   11 months ago Aortic atherosclerosis [facility])   [place] [facility] [place], Salvadore Oxford, NP   1 year ago [place]   [place] [facility] [place], [place] W, NP   1 year ago Type 2 diabetes mellitus with hyperglycemia, without long-term current use of insulin [facility])   [place] [facility] [place], [place], NP   1 year ago Type 2 diabetes mellitus with hyperglycemia, without long-term current use of insulin [facility])   [place] [facility] [place], Salvadore Oxford, NP       Future Appointments             In 1 month Baity, Salvadore Oxford, NP  [facility], [facility]

## 2023-08-05 ENCOUNTER — Ambulatory Visit: Payer: Medicare Other | Admitting: Neurology

## 2023-08-05 ENCOUNTER — Other Ambulatory Visit: Payer: Self-pay | Admitting: Internal Medicine

## 2023-08-06 NOTE — Telephone Encounter (Signed)
Duplicate request, last refill 08/04/23.  Requested Prescriptions  Pending Prescriptions Disp Refills   Blood Glucose Monitoring Suppl (ONETOUCH VERIO FLEX SYSTEM) w/Device KIT [Pharmacy Med Name: ONE TOUCH VERIO FLEX SYST KIT]      Sig: USED TO CHECK BLOOD SUGAR ONCE A DAY. DX E11.9     Endocrinology: Diabetes - Testing Supplies Passed - 08/05/2023 12:51 PM      Passed - Valid encounter within last 12 months    Recent Outpatient Visits           5 months ago Aortic atherosclerosis [facility])   [place] [facility] [place], Salvadore Oxford, NP   11 months ago Aortic atherosclerosis [facility])   [place] [facility] [place], Salvadore Oxford, NP   1 year ago Pruritus   [place] [facility] [place], [place], NP   1 year ago Type 2 diabetes mellitus with hyperglycemia, without long-term current use of insulin [facility])   [place] [facility] [place], [place], NP   1 year ago Type 2 diabetes mellitus with hyperglycemia, without long-term current use of insulin [facility])   [place] [facility] [place], Salvadore Oxford, NP       Future Appointments             In 1 month Baity, Salvadore Oxford, NP [facility], [facility]

## 2023-08-17 ENCOUNTER — Ambulatory Visit (INDEPENDENT_AMBULATORY_CARE_PROVIDER_SITE_OTHER): Payer: Medicare Other

## 2023-08-17 DIAGNOSIS — I639 Cerebral infarction, unspecified: Secondary | ICD-10-CM | POA: Diagnosis not present

## 2023-08-17 LAB — CUP PACEART REMOTE DEVICE CHECK
Date Time Interrogation Session: 20240928192710
Implantable Pulse Generator Implant Date: 20240620

## 2023-08-25 ENCOUNTER — Other Ambulatory Visit: Payer: Self-pay | Admitting: Internal Medicine

## 2023-08-25 NOTE — Telephone Encounter (Signed)
Atorvastatin already requested in a separate refill encounter today from the pharmacy.

## 2023-08-25 NOTE — Telephone Encounter (Signed)
Medication Refill - Medication:  atorvastatin (LIPITOR) 40 MG tablet *has about a week left  ONE TOUCH METER (patient's is broken)  Rimegepant Sulfate (NURTEC) 75 MG TBDP   Has the patient contacted their pharmacy? Yes, advised to contact PCP  Preferred Pharmacy (with phone number or street name):  [id number] [place], [place] - [address]  Phone: [phone] Fax: [phone]   Has the patient been seen for an appointment in the last year OR does the patient have an upcoming appointment? Yes. F/U on 09/07/2023

## 2023-08-26 MED ORDER — ONETOUCH VERIO FLEX SYSTEM W/DEVICE KIT: PACK | 0 refills | Status: AC

## 2023-08-26 MED ORDER — NURTEC 75 MG PO TBDP: ORAL_TABLET | ORAL | 2 refills | Status: DC

## 2023-08-26 NOTE — Telephone Encounter (Signed)
Requested medication (s) are due for refill today: routing for review  Requested medication (s) are on the active medication list: yes  Last refill:  multiple dates  Future visit scheduled: yes  Notes to clinic:   Medication not assigned to a protocol, review manually.      Requested Prescriptions  Pending Prescriptions Disp Refills   Blood Glucose Monitoring Suppl (ONETOUCH VERIO FLEX SYSTEM) w/Device KIT 1 kit 0    Sig: USED TO CHECK BLOOD SUGAR ONCE A DAY. DX E11.9     Endocrinology: Diabetes - Testing Supplies Passed - 08/25/2023  5:22 PM      Passed - Valid encounter within last 12 months    Recent Outpatient Visits           5 months ago Aortic atherosclerosis [facility])   [place] [facility] [place], Salvadore Oxford, NP   1 year ago Aortic atherosclerosis [facility])   [place] [facility] [place], Salvadore Oxford, NP   1 year ago Pruritus   [place] [facility] [place], [place], NP   1 year ago Type 2 diabetes mellitus with hyperglycemia, without long-term current use of insulin [facility])   [place] [facility] [place], [place], NP   1 year ago Type 2 diabetes mellitus with hyperglycemia, without long-term current use of insulin [facility])   [place] [facility] [place], Salvadore Oxford, NP       Future Appointments             In 1 week Sampson Si, Salvadore Oxford, NP [place] [facility]             Rimegepant Sulfate (NURTEC) 75 MG TBDP 8 tablet 2    Sig: TAKE 1 TAB AT ONSET OF MIGRAINE. MAY REPEAT IN 2 HRS, IF NEEDED. MAX DOSE: 2 TABS/DAY.     Off-Protocol Failed - 08/25/2023  5:22 PM      Failed - Medication not assigned to a protocol, review manually.      Passed - Valid encounter within last 12 months    Recent Outpatient Visits           5 months ago Aortic atherosclerosis [facility])   [place] [facility] [place], Salvadore Oxford, NP   1 year ago Aortic atherosclerosis [facility])    [place] [facility] [place], Salvadore Oxford, NP   1 year ago Pruritus   [place] [facility] [place], [place], NP   1 year ago Type 2 diabetes mellitus with hyperglycemia, without long-term current use of insulin [facility])   [place] [facility] [place], [place], NP   1 year ago Type 2 diabetes mellitus with hyperglycemia, without long-term current use of insulin [facility])   [place] [facility] [place], Salvadore Oxford, NP       Future Appointments             In 1 week Sampson Si, Salvadore Oxford, NP [facility], [facility]

## 2023-08-26 NOTE — Telephone Encounter (Signed)
Requested Prescriptions  Pending Prescriptions Disp Refills   atorvastatin (LIPITOR) 40 MG tablet [Pharmacy Med Name: ATORVASTATIN 40 MG TABLET] 90 tablet 1    Sig: TAKE 1 TABLET BY MOUTH EVERY DAY     Cardiovascular:  Antilipid - Statins Failed - 08/25/2023  3:31 PM      Failed - Lipid Panel in normal range within the last 12 months    Cholesterol  Date Value Ref Range Status  06/02/2023 113 <200 mg/dL Final   LDL Cholesterol (Calc)  Date Value Ref Range Status  06/02/2023 57 mg/dL (calc) Final    Comment:    Reference range: <100 . Desirable range <100 mg/dL for primary prevention;   <70 mg/dL for patients with CHD or diabetic patients  with > or = 2 CHD risk factors. Marland Kitchen LDL-C is now calculated using the Martin-Hopkins  calculation, which is a validated novel method providing  better accuracy than the Friedewald equation in the  estimation of LDL-C.  Horald Pollen et al. Lenox Ahr. 4098;119(14): 2061-2068  (http://education.[employer].com/faq/FAQ164)    HDL  Date Value Ref Range Status  06/02/2023 41 > OR = 40 mg/dL Final   Triglycerides  Date Value Ref Range Status  06/02/2023 70 <150 mg/dL Final         Passed - Patient is not pregnant      Passed - Valid encounter within last 12 months    Recent Outpatient Visits           5 months ago [facility])   [place] [facility] [place], Salvadore Oxford, NP   1 year ago Aortic atherosclerosis [facility])   [place] [facility] [place], Salvadore Oxford, NP   1 year ago Pruritus   [place] [facility] [place], [place], NP   1 year ago Type 2 diabetes mellitus with hyperglycemia, without long-term current use of insulin [facility])   [place] [facility] [place], [place], NP   1 year ago Type 2 diabetes mellitus with hyperglycemia, without long-term current use of insulin [facility])   [place] [facility] [place], Salvadore Oxford, NP       Future  Appointments             In 1 week Sampson Si, Salvadore Oxford, NP [facility], [facility]

## 2023-08-31 NOTE — Progress Notes (Signed)
Carelink Summary Report / Loop Recorder 

## 2023-09-07 ENCOUNTER — Encounter: Payer: Self-pay | Admitting: Internal Medicine

## 2023-09-07 ENCOUNTER — Ambulatory Visit (INDEPENDENT_AMBULATORY_CARE_PROVIDER_SITE_OTHER): Payer: Medicare Other | Admitting: Internal Medicine

## 2023-09-07 VITALS — BP 100/50 | HR 84 | Ht 75.0 in | Wt 199.0 lb

## 2023-09-07 DIAGNOSIS — G40209 Localization-related (focal) (partial) symptomatic epilepsy and epileptic syndromes with complex partial seizures, not intractable, without status epilepticus: Secondary | ICD-10-CM

## 2023-09-07 DIAGNOSIS — E1165 Type 2 diabetes mellitus with hyperglycemia: Secondary | ICD-10-CM | POA: Diagnosis not present

## 2023-09-07 DIAGNOSIS — I951 Orthostatic hypotension: Secondary | ICD-10-CM | POA: Insufficient documentation

## 2023-09-07 DIAGNOSIS — Z8673 Personal history of transient ischemic attack (TIA), and cerebral infarction without residual deficits: Secondary | ICD-10-CM

## 2023-09-07 DIAGNOSIS — M1611 Unilateral primary osteoarthritis, right hip: Secondary | ICD-10-CM

## 2023-09-07 DIAGNOSIS — E1169 Type 2 diabetes mellitus with other specified complication: Secondary | ICD-10-CM

## 2023-09-07 DIAGNOSIS — G43709 Chronic migraine without aura, not intractable, without status migrainosus: Secondary | ICD-10-CM | POA: Diagnosis not present

## 2023-09-07 DIAGNOSIS — E785 Hyperlipidemia, unspecified: Secondary | ICD-10-CM

## 2023-09-07 DIAGNOSIS — M8700 Idiopathic aseptic necrosis of unspecified bone: Secondary | ICD-10-CM

## 2023-09-07 DIAGNOSIS — K219 Gastro-esophageal reflux disease without esophagitis: Secondary | ICD-10-CM | POA: Diagnosis not present

## 2023-09-07 MED ORDER — MIDODRINE HCL 5 MG PO TABS: 5.0000 mg | ORAL_TABLET | Freq: Two times a day (BID) | ORAL | 1 refills | Status: DC

## 2023-09-07 NOTE — Assessment & Plan Note (Signed)
He will continue to follow with [facility]

## 2023-09-07 NOTE — Assessment & Plan Note (Signed)
CMET and lipid profile today Encouraged him to consume a low fat diet Continue atorvastatin

## 2023-09-07 NOTE — Progress Notes (Signed)
Subjective:    Patient ID: Ralph Simpson, male    DOB: [birth year], 62 y.o.   MRN: [id number]  HPI  Patient presents to clinic today for 7-month follow-up of chronic conditions.  AVN/OA bilateral hips: Status post bone grafting.  He does not take any medications for this.  He follows with orthopedics.  GERD: Triggered by meat.  He is not taking any medication for this.  Upper GI from 07/2019 reviewed.  Migraines: Triggered by weather changes.  These occur rarely.  He takes nurtec as needed with some relief of symptoms.  He follows with neurology.  DM2: His last A1c was 6.5%, 01/2023.  He is not taking any oral diabetic medication at this time.  He does not check his sugars.  He does not check his feet routinely.  His last eye exam was >1-year ago.  Flu never.  Pneumovax never.  COVID never.  HLD with aortic atherosclerosis status post stroke: His last LDL was 144, triglycerides 81, 02/2023.  He denies myalgias on atorvastatin.  He is taking aspirin as well.  He tries to consume a low-fat diet.  He follows with neurology.  Seizures: He denies recent seizures.  He is taking keppra as prescribed.  He follows with neurology.  Review of Systems     Past Medical History:  Diagnosis Date   Allergy    Avascular necrosis of bones of both hips (HCC)    GERD (gastroesophageal reflux disease)    Migraine    Neutropenia (HCC)    Osteoarthritis of both hips    Plantar fasciitis    Seizures (HCC)    x 2 03/2021   Trochanteric bursitis of right hip     Current Outpatient Medications  Medication Sig Dispense Refill   aspirin 81 MG chewable tablet Chew 1 tablet (81 mg total) by mouth daily. 21 tablet 0   atorvastatin (LIPITOR) 40 MG tablet TAKE 1 TABLET BY MOUTH EVERY DAY 90 tablet 1   Blood Glucose Monitoring Suppl (ONETOUCH VERIO FLEX SYSTEM) w/Device KIT USED TO CHECK BLOOD SUGAR ONCE A DAY. DX E11.9 1 kit 0   clonazePAM (KLONOPIN) 1 MG tablet Take 1 mg by mouth 2 (two) times daily.      clopidogrel (PLAVIX) 75 MG tablet Take 1 tablet (75 mg total) by mouth daily. (Patient not taking: Reported on 06/24/2023) 21 tablet 0   glucose blood (RELION GLUCOSE TEST STRIPS) test strip Use as instructed 100 each 12   Lancet Devices (RELION LANCING DEVICE) MISC Use to check blood sugar one a day.  DX: E11.9 100 each 1   levETIRAcetam (KEPPRA) 500 MG tablet Take 1 tablet (500 mg total) by mouth 2 (two) times daily. (Patient taking differently: Take 500 mg by mouth 2 (two) times daily. Take 3 tablets by mouth 2 times daily) 60 tablet 6   Rimegepant Sulfate (NURTEC) 75 MG TBDP TAKE 1 TAB AT ONSET OF MIGRAINE. MAY REPEAT IN 2 HRS, IF NEEDED. MAX DOSE: 2 TABS/DAY. 8 tablet 2   No current facility-administered medications for this visit.    Allergies  Allergen Reactions   Aspirin     Causes brain bleed     Family History  Problem Relation Age of Onset   Arthritis Mother    Stroke Mother    Diabetes Father    Heart disease Father    Hyperlipidemia Father    Hypertension Father    Kidney disease Maternal Grandfather    Colon cancer Neg Hx  Stomach cancer Neg Hx    Rectal cancer Neg Hx     Social History   Socioeconomic History   Marital status: Single    Spouse name: Not on file   Number of children: 0   Years of education: Not on file   Highest education level: 10th grade  Occupational History    Comment: NA  Tobacco Use   Smoking status: Former    Current packs/day: 1.00    Types: Cigarettes    Passive exposure: Never   Smokeless tobacco: Former    Types: Chew   Tobacco comments:    quit 10-15 years ago  Vaping Use   Vaping status: Never Used  Substance and Sexual Activity   Alcohol use: Not Currently   Drug use: No   Sexual activity: Not Currently  Other Topics Concern   Not on file  Social History Narrative   Not on file   Social Determinants of Health   Financial Resource Strain: Low Risk  (07/31/2023)   Overall Financial Resource Strain (CARDIA)     Difficulty of Paying Living Expenses: Not very hard  Food Insecurity: No Food Insecurity (07/31/2023)   Hunger Vital Sign    Worried About Running Out of Food in the Last Year: Never true    Ran Out of Food in the Last Year: Never true  Transportation Needs: No Transportation Needs (07/31/2023)   PRAPARE - Administrator, Civil Service (Medical): No    Lack of Transportation (Non-Medical): No  Physical Activity: Insufficiently Active (07/31/2023)   Exercise Vital Sign    Days of Exercise per Week: 2 days    Minutes of Exercise per Session: 20 min  Stress: No Stress Concern Present (07/31/2023)   [employer] of Occupational Health - Occupational Stress Questionnaire    Feeling of Stress : Only a little  Social Connections: Moderately Isolated (07/31/2023)   Social Connection and Isolation Panel [NHANES]    Frequency of Communication with Friends and Family: More than three times a week    Frequency of Social Gatherings with Friends and Family: Once a week    Attends Religious Services: Never    Database administrator or Organizations: Yes    Attends Banker Meetings: Never    Marital Status: Never married  Intimate Partner Violence: Not At Risk (07/31/2023)   Humiliation, Afraid, Rape, and Kick questionnaire    Fear of Current or Ex-Partner: No    Emotionally Abused: No    Physically Abused: No    Sexually Abused: No     Constitutional: Pt reports headache. Denies fever, malaise, fatigue, or abrupt weight changes.  HEENT: Denies eye pain, eye redness, ear pain, ringing in the ears, wax buildup, runny nose, nasal congestion, bloody nose, or sore throat. Respiratory: Denies difficulty breathing, shortness of breath, cough or sputum production.   Cardiovascular: Denies chest pain, chest tightness, palpitations or swelling in the hands or feet.  Gastrointestinal: Denies abdominal pain, bloating, constipation, diarrhea or blood in the stool.  GU: Denies  urgency, frequency, pain with urination, burning sensation, blood in urine, odor or discharge. Musculoskeletal: Pt reports hip pain. Denies decrease in range of motion, difficulty with gait, muscle pain or joint swelling.  Skin: Denies redness, rashes, lesions or ulcercations.  Neurological: Pt reports dizziness. Denies difficulty with memory, difficulty with speech or problems with balance and coordination.  Psych: Denies anxiety, depression, SI/HI.  No other specific complaints in a complete review of systems (except as  listed in HPI above).  Objective:   Physical Exam BP (!) 100/50   Pulse 84   Ht 6\' 3"  (1.905 m)   Wt 199 lb (90.3 kg)   SpO2 96%   BMI 24.87 kg/m   Wt Readings from Last 3 Encounters:  07/31/23 197 lb 9.6 oz (89.6 kg)  07/01/23 196 lb 11.2 oz (89.2 kg)  06/24/23 197 lb (89.4 kg)    General: Appears his stated age, well developed, well nourished in NAD. Skin: Warm, dry and intact.  HEENT: Head: normal shape and size; Eyes: sclera white, no icterus, conjunctiva pink, PERRLA and EOMs intact;  Cardiovascular: Normal rate and rhythm. S1,S2 noted.  No murmur, rubs or gallops noted. No JVD or BLE edema. No carotid bruits noted. Pulmonary/Chest: Normal effort and positive vesicular breath sounds. No respiratory distress. No wheezes, rales or ronchi noted.  Abdomen: Limping gait with use of cane.  Neurological: Alert and oriented. Cranial nerves II-XII grossly intact. Coordination normal.  Psychiatric: Mood and affect normal. Behavior is normal. Judgment and thought content normal.   BMET    Component Value Date/Time   NA 138 07/01/2023 1425   NA 141 07/16/2021 0813   K 4.2 07/01/2023 1425   CL 105 07/01/2023 1425   CO2 25 07/01/2023 1425   GLUCOSE 142 (H) 07/01/2023 1425   BUN 28 (H) 07/01/2023 1425   BUN 20 07/16/2021 0813   CREATININE 1.41 (H) 07/01/2023 1425   CREATININE 0.79 06/12/2022 1001   CALCIUM 9.5 07/01/2023 1425   GFRNONAA 56 (L) 07/01/2023 1425    GFRAA >60 11/11/2015 1340    Lipid Panel     Component Value Date/Time   CHOL 113 06/02/2023 0829   TRIG 70 06/02/2023 0829   HDL 41 06/02/2023 0829   CHOLHDL 2.8 06/02/2023 0829   VLDL 16 02/11/2023 0643   LDLCALC 57 06/02/2023 0829    CBC    Component Value Date/Time   WBC 4.1 07/01/2023 1425   RBC 5.01 07/01/2023 1425   HGB 14.5 07/01/2023 1425   HGB 14.8 04/28/2022 1559   HCT 42.5 07/01/2023 1425   HCT 43.0 04/28/2022 1559   PLT 173 07/01/2023 1425   PLT 211 04/28/2022 1559   MCV 84.8 07/01/2023 1425   MCV 86 04/28/2022 1559   MCH 28.9 07/01/2023 1425   MCHC 34.1 07/01/2023 1425   RDW 12.8 07/01/2023 1425   RDW 12.6 04/28/2022 1559   LYMPHSABS 1.2 07/01/2023 1425   LYMPHSABS 1.1 04/28/2022 1559   MONOABS 0.5 07/01/2023 1425   EOSABS 0.1 07/01/2023 1425   EOSABS 0.1 04/28/2022 1559   BASOSABS 0.0 07/01/2023 1425   BASOSABS 0.0 04/28/2022 1559    Hgb A1C Lab Results  Component Value Date   HGBA1C 6.5 (H) 02/11/2023           Assessment & Plan:       RTC in 6 months, followup chronic conditions Nicki Reaper, NP

## 2023-09-07 NOTE — Assessment & Plan Note (Signed)
Continue Keppra He will need to follow with neurology

## 2023-09-07 NOTE — Patient Instructions (Signed)

## 2023-09-07 NOTE — Assessment & Plan Note (Signed)
Try to avoid foods that trigger reflux Encourage weight loss as this can produce reflux symptoms Okay to take Tums or Pepcid OTC as needed if symptoms occur

## 2023-09-07 NOTE — Assessment & Plan Note (Deleted)
Try to avoid foods that trigger reflux Encourage weight loss as this can produce reflux symptoms Okay to take Tums or Pepcid OTC as needed if symptoms occur

## 2023-09-07 NOTE — Assessment & Plan Note (Signed)
Will start Midodrine 5 mg day Encouraged adequate water intake

## 2023-09-07 NOTE — Assessment & Plan Note (Signed)
CMET and lipid profile today Continue atorvastatin and aspirin Encouraged low fat diet

## 2023-09-07 NOTE — Assessment & Plan Note (Signed)
A1C and urine microalbumin today Encouraged him to consume a low carb diet Encouraged routine eye exam Encouraged routine foot exam He declines immunizations

## 2023-09-07 NOTE — Assessment & Plan Note (Signed)
Continue Nurtec as needed He will need to follow with neurology

## 2023-09-07 NOTE — Assessment & Plan Note (Signed)
He will continue to follow with [facility]

## 2023-09-08 LAB — COMPLETE METABOLIC PANEL WITH GFR
AG Ratio: 2 (calc) (ref 1.0–2.5)
ALT: 21 U/L (ref 9–46)
AST: 15 U/L (ref 10–35)
Albumin: 4.7 g/dL (ref 3.6–5.1)
Alkaline phosphatase (APISO): 80 U/L (ref 35–144)
BUN/Creatinine Ratio: 39 (calc) — ABNORMAL HIGH (ref 6–22)
BUN: 29 mg/dL — ABNORMAL HIGH (ref 7–25)
CO2: 25 mmol/L (ref 20–32)
Calcium: 9.9 mg/dL (ref 8.6–10.3)
Chloride: 104 mmol/L (ref 98–110)
Creat: 0.75 mg/dL (ref 0.70–1.35)
Globulin: 2.3 g/dL (ref 1.9–3.7)
Glucose, Bld: 135 mg/dL (ref 65–139)
Potassium: 4.4 mmol/L (ref 3.5–5.3)
Sodium: 138 mmol/L (ref 135–146)
Total Bilirubin: 0.5 mg/dL (ref 0.2–1.2)
Total Protein: 7 g/dL (ref 6.1–8.1)
eGFR: 101 mL/min/{1.73_m2} (ref >=60)

## 2023-09-08 LAB — LIPID PANEL
Cholesterol: 139 mg/dL (ref <200)
HDL: 37 mg/dL — ABNORMAL LOW (ref >=40)
LDL Cholesterol (Calc): 75 mg/dL
Non-HDL Cholesterol (Calc): 102 mg/dL (ref <130)
Total CHOL/HDL Ratio: 3.8 (calc) (ref <5.0)
Triglycerides: 176 mg/dL — ABNORMAL HIGH (ref <150)

## 2023-09-08 LAB — CBC
HCT: 45.5 % (ref 38.5–50.0)
Hemoglobin: 15.3 g/dL (ref 13.2–17.1)
MCH: 28.8 pg (ref 27.0–33.0)
MCHC: 33.6 g/dL (ref 32.0–36.0)
MCV: 85.7 fL (ref 80.0–100.0)
MPV: 10.2 fL (ref 7.5–12.5)
Platelets: 199 10*3/uL (ref 140–400)
RBC: 5.31 10*6/uL (ref 4.20–5.80)
RDW: 12.9 % (ref 11.0–15.0)
WBC: 4.3 10*3/uL (ref 3.8–10.8)

## 2023-09-08 LAB — HEMOGLOBIN A1C
Hgb A1c MFr Bld: 8.2 %{Hb} — ABNORMAL HIGH (ref <5.7)
Mean Plasma Glucose: 189 mg/dL
eAG (mmol/L): 10.4 mmol/L

## 2023-09-18 ENCOUNTER — Ambulatory Visit: Payer: Medicare Other | Admitting: Cardiology

## 2023-09-18 LAB — CUP PACEART REMOTE DEVICE CHECK
Date Time Interrogation Session: 20241031192707
Implantable Pulse Generator Implant Date: 20240620

## 2023-09-21 ENCOUNTER — Ambulatory Visit (INDEPENDENT_AMBULATORY_CARE_PROVIDER_SITE_OTHER): Payer: Medicare Other

## 2023-09-21 DIAGNOSIS — I639 Cerebral infarction, unspecified: Secondary | ICD-10-CM | POA: Diagnosis not present

## 2023-10-12 NOTE — Progress Notes (Signed)
Carelink Summary Report / Loop Recorder 

## 2023-10-26 ENCOUNTER — Ambulatory Visit (INDEPENDENT_AMBULATORY_CARE_PROVIDER_SITE_OTHER): Payer: Medicare Other

## 2023-10-26 DIAGNOSIS — I639 Cerebral infarction, unspecified: Secondary | ICD-10-CM | POA: Diagnosis not present

## 2023-10-26 LAB — CUP PACEART REMOTE DEVICE CHECK: Date Time Interrogation Session: 20241208231806

## 2023-11-30 ENCOUNTER — Ambulatory Visit (INDEPENDENT_AMBULATORY_CARE_PROVIDER_SITE_OTHER): Payer: Medicare Other

## 2023-11-30 ENCOUNTER — Ambulatory Visit: Payer: Self-pay | Admitting: *Deleted

## 2023-11-30 DIAGNOSIS — I639 Cerebral infarction, unspecified: Secondary | ICD-10-CM

## 2023-11-30 LAB — CUP PACEART REMOTE DEVICE CHECK: Date Time Interrogation Session: 20250112232435

## 2023-11-30 NOTE — Telephone Encounter (Signed)
 2nd attempt to contact patient to review elevated glucose. No answer, LVMTCB [phone].

## 2023-11-30 NOTE — Telephone Encounter (Signed)
 No contact made with patient Chief Complaint: elevated blood sugars Symptoms: per agent note: blood glucose 282 and 242.  Frequency: yesterday and this am Pertinent Negatives: Patient denies na Disposition: [] ED /[] Urgent Care (no appt availability in office) / [] Appointment(In office/virtual)/ []  [place] [facility]/ [] Home Care/ [] Refused Recommended Disposition /[] [facility]/ [x]  Follow-up with PCP Additional Notes:   No contact with patient. Left message to call back [phone].  Summary: high blood sugar   Pt states that his blood sugar is running high. Pt states his blood sugar was 282 yesterday and when he checked it this morning it was 242. Pt states that he is eating right and do not know why his blood sugar is high. Pt states that he is not having any symptoms. Pt wanted an appt scheduled with PCP. First available appt was today, pt declined since the appt was not after 3:30pm. First available appt after 3:30pm was on 12/08/2023 at 3:40pm. Pt requested to schedule that appt.             Reason for Disposition  Third attempt to contact caller AND no contact made. Phone number verified.  Answer Assessment - Initial Assessment Questions N/A Patient calling in regards to high blood sugars.  Protocols used: No Contact or Duplicate Contact Call-A-AH

## 2023-11-30 NOTE — Telephone Encounter (Signed)
 Summary: high blood sugar   Pt states that his blood sugar is running high. Pt states his blood sugar was 282 yesterday and when he checked it this morning it was 242. Pt states that he is eating right and do not know why his blood sugar is high. Pt states that he is not having any symptoms. Pt wanted an appt scheduled with PCP. First available appt was today, pt declined since the appt was not after 3:30pm. First available appt after 3:30pm was on 12/08/2023 at 3:40pm. Pt requested to schedule that appt.        Called patient to review sx of elevated blood glucose. No answer, LVMTCB [phone].

## 2023-12-08 ENCOUNTER — Ambulatory Visit (INDEPENDENT_AMBULATORY_CARE_PROVIDER_SITE_OTHER): Payer: Medicare Other | Admitting: Internal Medicine

## 2023-12-08 ENCOUNTER — Encounter: Payer: Self-pay | Admitting: Internal Medicine

## 2023-12-08 ENCOUNTER — Other Ambulatory Visit: Payer: Self-pay | Admitting: Internal Medicine

## 2023-12-08 VITALS — BP 108/64 | Ht 75.0 in | Wt 202.6 lb

## 2023-12-08 DIAGNOSIS — E785 Hyperlipidemia, unspecified: Secondary | ICD-10-CM

## 2023-12-08 DIAGNOSIS — E1169 Type 2 diabetes mellitus with other specified complication: Secondary | ICD-10-CM

## 2023-12-08 DIAGNOSIS — E1165 Type 2 diabetes mellitus with hyperglycemia: Secondary | ICD-10-CM

## 2023-12-08 LAB — POCT GLYCOSYLATED HEMOGLOBIN (HGB A1C): Hemoglobin A1C: 7.9 % — AB (ref 4.0–5.6)

## 2023-12-08 MED ORDER — RELION BLOOD GLUCOSE TEST VI STRP: ORAL_STRIP | 12 refills | Status: DC

## 2023-12-08 NOTE — Telephone Encounter (Signed)
Requested medication (s) are due for refill today: yes  Requested medication (s) are on the active medication list: yes  Last refill:  12/08/23  Future visit scheduled: yes  Notes to clinic:  Pharmacy comment: Please clarify the directions  for this prescription.      Requested Prescriptions  Pending Prescriptions Disp Refills   RELION GLUCOSE TEST STRIPS test strip [Pharmacy Med Name: RELION PREMIER TEST STRIP] 100 each 12    Sig: USE AS DIRECTED     Endocrinology: Diabetes - Testing Supplies Passed - 12/08/2023  3:37 PM      Passed - Valid encounter within last 12 months    Recent Outpatient Visits           Today Hyperlipidemia associated with type 2 diabetes mellitus [facility])   [place] [facility] [place], [place] W, NP   3 months ago Partial symptomatic epilepsy with complex partial seizures, not intractable, without status epilepticus [facility])   [place] [facility] [place], Salvadore Oxford, NP   9 months ago Aortic atherosclerosis [facility])   [place] [facility] [place], Salvadore Oxford, NP   1 year ago Aortic atherosclerosis [facility])   [place] [facility] [place], Salvadore Oxford, NP   1 year ago [place]   [place] [facility] [place], Salvadore Oxford, NP       Future Appointments             In 3 months [place], Salvadore Oxford, NP [place] [facility], [facility]

## 2023-12-08 NOTE — Progress Notes (Signed)
Subjective:    Patient ID: Ralph Simpson, male    DOB: 15-Mar-1961, 63 y.o.   MRN: [id number]  HPI  Patient presents to clinic today for 3-month follow-up of HLD/DM 2.  DM2: His last A1c was 8.2%, 08/2023.  He is not taking any oral diabetic medication at this time.  He sugars range 146-282  He does not check his feet routinely.  His last eye exam was >1-year ago.  Flu never.  Pneumovax never.  COVID never.  HLD with aortic atherosclerosis status post stroke: His last LDL was 75, triglycerides 440, 08/2023.  He denies myalgias on atorvastatin.  He is taking aspirin as well.  He tries to consume a low-fat diet.  He follows with neurology.  Review of Systems     Past Medical History:  Diagnosis Date   Allergy    Avascular necrosis of bones of both hips (HCC)    GERD (gastroesophageal reflux disease)    Migraine    Neutropenia (HCC)    Osteoarthritis of both hips    Plantar fasciitis    Seizures (HCC)    x 2 03/2021   Trochanteric bursitis of right hip     Current Outpatient Medications  Medication Sig Dispense Refill   aspirin 81 MG chewable tablet Chew 1 tablet (81 mg total) by mouth daily. 21 tablet 0   atorvastatin (LIPITOR) 40 MG tablet TAKE 1 TABLET BY MOUTH EVERY DAY 90 tablet 1   Blood Glucose Monitoring Suppl (ONETOUCH VERIO FLEX SYSTEM) w/Device KIT USED TO CHECK BLOOD SUGAR ONCE A DAY. DX E11.9 1 kit 0   clonazePAM (KLONOPIN) 1 MG tablet Take 1 mg by mouth 2 (two) times daily.     glucose blood (RELION GLUCOSE TEST STRIPS) test strip Use as instructed 100 each 12   Lancet Devices (RELION LANCING DEVICE) MISC Use to check blood sugar one a day.  DX: E11.9 100 each 1   levETIRAcetam (KEPPRA) 500 MG tablet Take 1 tablet (500 mg total) by mouth 2 (two) times daily. (Patient taking differently: Take 500 mg by mouth 2 (two) times daily. Take 3 tablets by mouth 2 times daily) 60 tablet 6   midodrine (PROAMATINE) 5 MG tablet Take 1 tablet (5 mg total) by mouth 2 (two) times  daily with a meal. 180 tablet 1   Rimegepant Sulfate (NURTEC) 75 MG TBDP TAKE 1 TAB AT ONSET OF MIGRAINE. MAY REPEAT IN 2 HRS, IF NEEDED. MAX DOSE: 2 TABS/DAY. 8 tablet 2   No current facility-administered medications for this visit.    Allergies  Allergen Reactions   Aspirin     Causes brain bleed     Family History  Problem Relation Age of Onset   Arthritis Mother    Stroke Mother    Diabetes Father    Heart disease Father    Hyperlipidemia Father    Hypertension Father    Kidney disease Maternal Grandfather    Colon cancer Neg Hx    Stomach cancer Neg Hx    Rectal cancer Neg Hx     Social History   Socioeconomic History   Marital status: Single    Spouse name: Not on file   Number of children: 0   Years of education: Not on file   Highest education level: 10th grade  Occupational History    Comment: NA  Tobacco Use   Smoking status: Former    Current packs/day: 1.00    Types: Cigarettes    Passive exposure:  Never   Smokeless tobacco: Former    Types: Chew   Tobacco comments:    quit 10-15 years ago  Vaping Use   Vaping status: Never Used  Substance and Sexual Activity   Alcohol use: Not Currently   Drug use: No   Sexual activity: Not Currently  Other Topics Concern   Not on file  Social History Narrative   Not on file   Social Drivers of Health   Financial Resource Strain: Low Risk  (07/31/2023)   Overall Financial Resource Strain (CARDIA)    Difficulty of Paying Living Expenses: Not very hard  Food Insecurity: No Food Insecurity (07/31/2023)   Hunger Vital Sign    Worried About Running Out of Food in the Last Year: Never true    Ran Out of Food in the Last Year: Never true  Transportation Needs: No Transportation Needs (07/31/2023)   PRAPARE - Administrator, Civil Service (Medical): No    Lack of Transportation (Non-Medical): No  Physical Activity: Insufficiently Active (07/31/2023)   Exercise Vital Sign    Days of Exercise per Week:  2 days    Minutes of Exercise per Session: 20 min  Stress: No Stress Concern Present (07/31/2023)   [employer] of Occupational Health - Occupational Stress Questionnaire    Feeling of Stress : Only a little  Social Connections: Moderately Isolated (07/31/2023)   Social Connection and Isolation Panel [NHANES]    Frequency of Communication with Friends and Family: More than three times a week    Frequency of Social Gatherings with Friends and Family: Once a week    Attends Religious Services: Never    Database administrator or Organizations: Yes    Attends Banker Meetings: Never    Marital Status: Never married  Intimate Partner Violence: Not At Risk (07/31/2023)   Humiliation, Afraid, Rape, and Kick questionnaire    Fear of Current or Ex-Partner: No    Emotionally Abused: No    Physically Abused: No    Sexually Abused: No     Constitutional: Denies fever, malaise, fatigue, or abrupt weight changes.  HEENT: Denies eye pain, eye redness, ear pain, ringing in the ears, wax buildup, runny nose, nasal congestion, bloody nose, or sore throat. Respiratory: Denies difficulty breathing, shortness of breath, cough or sputum production.   Cardiovascular: Denies chest pain, chest tightness, palpitations or swelling in the hands or feet.  Gastrointestinal: Denies abdominal pain, bloating, constipation, diarrhea or blood in the stool.  GU: Pt reports urinary frequency. Denies urgency, frequency, pain with urination, burning sensation, blood in urine, odor or discharge. Musculoskeletal: Pt reports hip pain. Denies decrease in range of motion, difficulty with gait, muscle pain or joint swelling.  Skin: Denies redness, rashes, lesions or ulcercations.  Neurological: Denies dizziness, difficulty with memory, difficulty with speech or problems with balance and coordination.  Psych: Denies anxiety, depression, SI/HI.  No other specific complaints in a complete review of systems (except  as listed in HPI above).  Objective:   Physical Exam BP 108/64 (BP Location: Left Arm, Patient Position: Sitting, Cuff Size: Normal)   Ht 6\' 3"  (1.905 m)   Wt 202 lb 9.6 oz (91.9 kg)   BMI 25.32 kg/m    Wt Readings from Last 3 Encounters:  09/07/23 199 lb (90.3 kg)  07/31/23 197 lb 9.6 oz (89.6 kg)  07/01/23 196 lb 11.2 oz (89.2 kg)    General: Appears his stated age, well developed, well nourished in NAD.  Skin: Warm, dry and intact. No ulcerations noted. HEENT: Head: normal shape and size; Eyes: sclera white, no icterus, conjunctiva pink, PERRLA and EOMs intact;  Cardiovascular: Normal rate and rhythm. S1,S2 noted.  No murmur, rubs or gallops noted. No JVD or BLE edema. No carotid bruits noted. Pulmonary/Chest: Normal effort and positive vesicular breath sounds. No respiratory distress. No wheezes, rales or ronchi noted.  Neurological: Alert and oriented. Sensation intact to BLE.  BMET    Component Value Date/Time   NA 138 09/07/2023 1548   NA 141 07/16/2021 0813   K 4.4 09/07/2023 1548   CL 104 09/07/2023 1548   CO2 25 09/07/2023 1548   GLUCOSE 135 09/07/2023 1548   BUN 29 (H) 09/07/2023 1548   BUN 20 07/16/2021 0813   CREATININE 0.75 09/07/2023 1548   CALCIUM 9.9 09/07/2023 1548   GFRNONAA 56 (L) 07/01/2023 1425   GFRAA >60 11/11/2015 1340    Lipid Panel     Component Value Date/Time   CHOL 139 09/07/2023 1548   TRIG 176 (H) 09/07/2023 1548   HDL 37 (L) 09/07/2023 1548   CHOLHDL 3.8 09/07/2023 1548   VLDL 16 02/11/2023 0643   LDLCALC 75 09/07/2023 1548    CBC    Component Value Date/Time   WBC 4.3 09/07/2023 1548   RBC 5.31 09/07/2023 1548   HGB 15.3 09/07/2023 1548   HGB 14.8 04/28/2022 1559   HCT 45.5 09/07/2023 1548   HCT 43.0 04/28/2022 1559   PLT 199 09/07/2023 1548   PLT 211 04/28/2022 1559   MCV 85.7 09/07/2023 1548   MCV 86 04/28/2022 1559   MCH 28.8 09/07/2023 1548   MCHC 33.6 09/07/2023 1548   RDW 12.9 09/07/2023 1548   RDW 12.6  04/28/2022 1559   LYMPHSABS 1.2 07/01/2023 1425   LYMPHSABS 1.1 04/28/2022 1559   MONOABS 0.5 07/01/2023 1425   EOSABS 0.1 07/01/2023 1425   EOSABS 0.1 04/28/2022 1559   BASOSABS 0.0 07/01/2023 1425   BASOSABS 0.0 04/28/2022 1559    Hgb A1C Lab Results  Component Value Date   HGBA1C 8.2 (H) 09/07/2023           Assessment & Plan:       RTC in 3 months, followup chronic conditions Nicki Reaper, NP

## 2023-12-08 NOTE — Assessment & Plan Note (Signed)
We will check c-Met lipid profile at next visit Encouraged him to consume a low fat diet Continue atorvastatin

## 2023-12-08 NOTE — Patient Instructions (Signed)

## 2023-12-08 NOTE — Assessment & Plan Note (Addendum)
POCT A1c 7.9 He declines starting oral diabetic medication at this time, despite know his risk for heart attack, stroke, diabetic retinopathy, neuropathy Encouraged him to consume a low carb diet Encouraged routine eye exam Encouraged routine foot exam He declines immunizations

## 2023-12-15 ENCOUNTER — Telehealth: Payer: Self-pay | Admitting: Internal Medicine

## 2023-12-15 NOTE — Telephone Encounter (Signed)
Medication Refill -  Most Recent Primary Care Visit:  Provider: Lorre Munroe  Department: [facility]  Visit Type: OFFICE VISIT  Date: 12/08/2023  Medication: RELION GLUCOSE TEST STRIPS   Has the patient contacted their pharmacy? yes (Agent: If yes, when and what did the pharmacy advise?)contact pcp  Is this the correct pharmacy for this prescription? yes  This is the patient's preferred pharmacy:  [facility] [address] [place] - [address] [place] [address] Phone: [phone] Fax: [phone]   Has the prescription been filled recently? no  Is the patient out of the medication? yes  Has the patient been seen for an appointment in the last year OR does the patient have an upcoming appointment? yes  Can we respond through MyChart? yes  Agent: Please be advised that Rx refills may take up to 3 business days. We ask that you follow-up with your pharmacy.

## 2023-12-15 NOTE — Telephone Encounter (Addendum)
Pt called in states was told needs some type of number in order to fill his rx of RELION GLUCOSE TEST STRIPS , , he isnt sure what number they mean. He may mean a prior [place]

## 2023-12-15 NOTE — Telephone Encounter (Signed)
[facility] called and no one answered the phone. Patient called, left VM to return the call to the office regarding a refill request. If patient returns the call, let him know it was sent on 12/09/23 #100/12 refill(s) to [facility] and to check with them to make sure it was received. If not received, call us back so it can be resent.

## 2023-12-16 ENCOUNTER — Other Ambulatory Visit: Payer: Self-pay

## 2023-12-16 MED ORDER — RELION BLOOD GLUCOSE TEST VI STRP: 1.0000 | ORAL_STRIP | Freq: Three times a day (TID) | 12 refills | Status: DC | PRN

## 2023-12-16 NOTE — Telephone Encounter (Signed)
Called pharmacy and they advised prescription needed to be resent with Dx code. I added that and resent to pharmacy. Patient advised.

## 2023-12-22 ENCOUNTER — Other Ambulatory Visit: Payer: Self-pay

## 2023-12-22 MED ORDER — RELION BLOOD GLUCOSE TEST VI STRP: 1.0000 | ORAL_STRIP | Freq: Three times a day (TID) | 12 refills | Status: DC

## 2024-01-01 ENCOUNTER — Telehealth: Payer: Self-pay | Admitting: Internal Medicine

## 2024-01-01 ENCOUNTER — Other Ambulatory Visit: Payer: Self-pay

## 2024-01-01 MED ORDER — RELION BLOOD GLUCOSE TEST VI STRP: 1.0000 | ORAL_STRIP | Freq: Every day | 12 refills | Status: AC

## 2024-01-01 NOTE — Telephone Encounter (Signed)
Done

## 2024-01-01 NOTE — Telephone Encounter (Signed)
Patient states that his insurance will not cover his glucose blood (RELION GLUCOSE TEST STRIPS) test strip if the rx states to test 3x per day. Patient is requesting that rx be update to 1x per day. Please resend to [facility] [address], [place] - [address] [place] [address] Phone: [phone]  Fax: [phone]

## 2024-01-01 NOTE — Telephone Encounter (Signed)
yes

## 2024-01-04 ENCOUNTER — Ambulatory Visit (INDEPENDENT_AMBULATORY_CARE_PROVIDER_SITE_OTHER): Payer: Medicare Other

## 2024-01-04 DIAGNOSIS — I639 Cerebral infarction, unspecified: Secondary | ICD-10-CM | POA: Diagnosis not present

## 2024-01-05 ENCOUNTER — Telehealth: Payer: Self-pay | Admitting: *Deleted

## 2024-01-05 LAB — CUP PACEART REMOTE DEVICE CHECK: Date Time Interrogation Session: 20250216231833

## 2024-01-05 NOTE — Patient Outreach (Signed)
  Care Coordination   Follow Up Visit Note   01/05/2024 Name: Ralph Simpson MRN: [id number] DOB: [birth year]  Ralph Simpson is a 63 y.o. year old male who sees [place], Salvadore Oxford, NP for primary care. I spoke with  Ralph Simpson by phone today.  What matters to the patients health and wellness today?  Patient report he is doing well, working to better improve his diabetes.    Goals Addressed             This Visit's Progress    COMPLETED: Adequate recovery post stroke       Care Coordination Interventions: Evaluation of current treatment plan related to stroke and patient's adherence to plan as established by provider Advised patient to monitor blood pressure daily and record readings Reviewed medications with patient and discussed affordability and adherence Reviewed scheduled/upcoming provider appointments including neurology follow upon 7/9 Discussed plans with patient for ongoing care management follow up and provided patient with direct contact information for care management team      Management of chronic medical conditions       Interventions Today    Flowsheet Row Most Recent Value  Chronic Disease   Chronic disease during today's visit Diabetes, Other  [hypotension, seizure disorder]  General Interventions   General Interventions Discussed/Reviewed General Interventions Reviewed, Doctor Visits, Labs  Labs Hgb A1c every 3 months  [Recently decreased from 8.2 down to 7.9]  Doctor Visits Discussed/Reviewed Doctor Visits Reviewed, PCP, Specialist  [Upcoming: 3/5 with ortho for hip pain, 4/22 with PCP, 5/7 with neuro]  PCP/Specialist Visits Compliance with follow-up visit  Education Interventions   Education Provided Provided Education, Provided Web-based Education  Provided Verbal Education On Nutrition, Blood Sugar Monitoring, Medication, When to see the doctor  [facility] reviewed, report taking as instructed.  Continue monitoring blood sugar, today was 180, but as low as 114,  as high as 200s]  Nutrition Interventions   Nutrition Discussed/Reviewed Carbohydrate meal planning, Nutrition Reviewed, Adding fruits and vegetables, Decreasing sugar intake  [Report he has dcreased his carbs and sugars, trying to maintain a diabetic diet]              SDOH assessments and interventions completed:  No     Care Coordination Interventions:  Yes, provided   Follow up plan: Follow up call scheduled for 4/24 with F. McCray.     Encounter Outcome:  Patient Visit Completed   Rodney Langton, RN, MSN, CCM [place]  [facility], [facility] RN Care Coordinator Direct Dial: [phone] / Main [phone] Fax [phone] Email: [email or web address]

## 2024-01-05 NOTE — Patient Instructions (Signed)
Visit Information  Thank you for taking time to visit with me today. Please don't hesitate to contact me if I can be of assistance to you before our next scheduled telephone appointment.  Following are the goals we discussed today:  Continue to monitor blood sugars.  Notify MD if you have any seizures.  Follow diabetic diet and take meds as instructed  Our next appointment is by telephone on 4/24  Please call the care guide team at [phone] if you need to cancel or reschedule your appointment.   Please call the Suicide and Crisis Lifeline: 988 call the [place] National Suicide Prevention Lifeline: (629) 504-6370 or TTY: 908-614-2769 TTY (819)278-5168) to talk to a trained counselor call 1-800-273-TALK (toll free, 24 hour hotline) call 911 if you are experiencing a Mental Health or Behavioral Health Crisis or need someone to talk to.  Patient verbalizes understanding of instructions and care plan provided today and agrees to view in MyChart. Active MyChart status and patient understanding of how to access instructions and care plan via MyChart confirmed with patient.     The patient has been provided with contact information for the care management team and has been advised to call with any health related questions or concerns.   Rodney Langton, RN, MSN, CCM [facility], [facility] RN Care Coordinator Direct Dial: [phone] / Main [phone] Fax [phone] Email: [email or web address]: [email or web address]

## 2024-01-06 ENCOUNTER — Encounter: Payer: Self-pay | Admitting: Cardiovascular Disease

## 2024-01-11 NOTE — Progress Notes (Signed)
 Carelink Summary Report / Loop Recorder

## 2024-01-25 ENCOUNTER — Encounter: Payer: Self-pay | Admitting: Internal Medicine

## 2024-01-25 ENCOUNTER — Ambulatory Visit (INDEPENDENT_AMBULATORY_CARE_PROVIDER_SITE_OTHER): Admitting: Internal Medicine

## 2024-01-25 ENCOUNTER — Ambulatory Visit: Payer: Self-pay | Admitting: Internal Medicine

## 2024-01-25 VITALS — BP 124/68 | Ht 75.0 in | Wt 197.0 lb

## 2024-01-25 DIAGNOSIS — Z7984 Long term (current) use of oral hypoglycemic drugs: Secondary | ICD-10-CM

## 2024-01-25 DIAGNOSIS — E1165 Type 2 diabetes mellitus with hyperglycemia: Secondary | ICD-10-CM | POA: Diagnosis not present

## 2024-01-25 DIAGNOSIS — E119 Type 2 diabetes mellitus without complications: Secondary | ICD-10-CM | POA: Diagnosis not present

## 2024-01-25 MED ORDER — NURTEC 75 MG PO TBDP
ORAL_TABLET | ORAL | 0 refills | Status: AC
Start: 2024-01-25 — End: ?

## 2024-01-25 MED ORDER — METFORMIN HCL 1000 MG PO TABS: ORAL_TABLET | ORAL | 0 refills | Status: DC

## 2024-01-25 NOTE — Telephone Encounter (Signed)
Will discuss at upcoming appointment today 

## 2024-01-25 NOTE — Patient Instructions (Signed)

## 2024-01-25 NOTE — Progress Notes (Signed)
 Subjective:    Patient ID: Ralph Simpson, male    DOB: [birth year], 63 y.o.   MRN: [id number]  HPI  Patient presents to clinic today for follow-up DM 2.  DM2: His last A1c was 7.9 %, 11/2023.  He is not taking any oral diabetic medication at this time.  He sugars range 217-317  He does not check his feet routinely.  His last eye exam was >1-year ago.  Flu never.  Pneumovax never.  COVID never.  Review of Systems     Past Medical History:  Diagnosis Date   Allergy    Avascular necrosis of bones of both hips (HCC)    GERD (gastroesophageal reflux disease)    Migraine    Neutropenia (HCC)    Osteoarthritis of both hips    Plantar fasciitis    Seizures (HCC)    x 2 03/2021   Trochanteric bursitis of right hip     Current Outpatient Medications  Medication Sig Dispense Refill   aspirin 81 MG chewable tablet Chew 1 tablet (81 mg total) by mouth daily. 21 tablet 0   atorvastatin (LIPITOR) 40 MG tablet TAKE 1 TABLET BY MOUTH EVERY DAY 90 tablet 1   Blood Glucose Monitoring Suppl (ONETOUCH VERIO FLEX SYSTEM) w/Device KIT USED TO CHECK BLOOD SUGAR ONCE A DAY. DX E11.9 1 kit 0   clonazePAM (KLONOPIN) 1 MG tablet Take 1 mg by mouth 2 (two) times daily.     glucose blood (RELION GLUCOSE TEST STRIPS) test strip 1 each by Other route daily. One time daily 100 each 12   Lancet Devices (RELION LANCING DEVICE) MISC Use to check blood sugar one a day.  DX: E11.9 100 each 1   levETIRAcetam (KEPPRA) 500 MG tablet Take 1 tablet (500 mg total) by mouth 2 (two) times daily. (Patient taking differently: Take 500 mg by mouth 2 (two) times daily. Take 3 tablets by mouth 2 times daily) 60 tablet 6   midodrine (PROAMATINE) 5 MG tablet Take 1 tablet (5 mg total) by mouth 2 (two) times daily with a meal. 180 tablet 1   Rimegepant Sulfate (NURTEC) 75 MG TBDP TAKE 1 TAB AT ONSET OF MIGRAINE. MAY REPEAT IN 2 HRS, IF NEEDED. MAX DOSE: 2 TABS/DAY. 8 tablet 2   No current facility-administered medications for this  visit.    Allergies  Allergen Reactions   Aspirin     Causes brain bleed     Family History  Problem Relation Age of Onset   Arthritis Mother    Stroke Mother    Diabetes Father    Heart disease Father    Hyperlipidemia Father    Hypertension Father    Kidney disease Maternal Grandfather    Colon cancer Neg Hx    Stomach cancer Neg Hx    Rectal cancer Neg Hx     Social History   Socioeconomic History   Marital status: Single    Spouse name: Not on file   Number of children: 0   Years of education: Not on file   Highest education level: 10th grade  Occupational History    Comment: NA  Tobacco Use   Smoking status: Former    Current packs/day: 1.00    Types: Cigarettes    Passive exposure: Never   Smokeless tobacco: Former    Types: Chew   Tobacco comments:    quit 10-15 years ago  Vaping Use   Vaping status: Never Used  Substance and Sexual Activity  Alcohol use: Not Currently   Drug use: No   Sexual activity: Not Currently  Other Topics Concern   Not on file  Social History Narrative   Not on file   Social Drivers of Health   Financial Resource Strain: Low Risk  (07/31/2023)   Overall Financial Resource Strain (CARDIA)    Difficulty of Paying Living Expenses: Not very hard  Food Insecurity: No Food Insecurity (07/31/2023)   Hunger Vital Sign    Worried About Running Out of Food in the Last Year: Never true    Ran Out of Food in the Last Year: Never true  Transportation Needs: No Transportation Needs (07/31/2023)   PRAPARE - Administrator, Civil Service (Medical): No    Lack of Transportation (Non-Medical): No  Physical Activity: Insufficiently Active (07/31/2023)   Exercise Vital Sign    Days of Exercise per Week: 2 days    Minutes of Exercise per Session: 20 min  Stress: No Stress Concern Present (07/31/2023)   [employer] of Occupational Health - Occupational Stress Questionnaire    Feeling of Stress : Only a little  Social  Connections: Moderately Isolated (07/31/2023)   Social Connection and Isolation Panel [NHANES]    Frequency of Communication with Friends and Family: More than three times a week    Frequency of Social Gatherings with Friends and Family: Once a week    Attends Religious Services: Never    Database administrator or Organizations: Yes    Attends Banker Meetings: Never    Marital Status: Never married  Intimate Partner Violence: Not At Risk (07/31/2023)   Humiliation, Afraid, Rape, and Kick questionnaire    Fear of Current or Ex-Partner: No    Emotionally Abused: No    Physically Abused: No    Sexually Abused: No     Constitutional: Denies fever, malaise, fatigue, or abrupt weight changes.  HEENT: Denies eye pain, eye redness, ear pain, ringing in the ears, wax buildup, runny nose, nasal congestion, bloody nose, or sore throat. Respiratory: Denies difficulty breathing, shortness of breath, cough or sputum production.   Cardiovascular: Denies chest pain, chest tightness, palpitations or swelling in the hands or feet.  Gastrointestinal: Denies abdominal pain, bloating, constipation, diarrhea or blood in the stool.  GU: Pt reports urinary frequency. Denies urgency, frequency, pain with urination, burning sensation, blood in urine, odor or discharge. Musculoskeletal: Pt reports hip pain. Denies decrease in range of motion, difficulty with gait, muscle pain or joint swelling.  Skin: Denies redness, rashes, lesions or ulcercations.  Neurological: Denies dizziness, difficulty with memory, difficulty with speech or problems with balance and coordination.  Psych: Denies anxiety, depression, SI/HI.  No other specific complaints in a complete review of systems (except as listed in HPI above).  Objective:   Physical Exam BP 124/68 (BP Location: Left Arm, Patient Position: Sitting, Cuff Size: Normal)   Ht 6\' 3"  (1.905 m)   Wt 197 lb (89.4 kg)   BMI 24.62 kg/m    Wt Readings from  Last 3 Encounters:  12/08/23 202 lb 9.6 oz (91.9 kg)  09/07/23 199 lb (90.3 kg)  07/31/23 197 lb 9.6 oz (89.6 kg)    General: Appears his stated age, well developed, well nourished in NAD. Skin: Warm, dry and intact. No ulcerations noted. HEENT: Head: normal shape and size; Eyes: sclera white, no icterus, conjunctiva pink, PERRLA and EOMs intact;  Cardiovascular: Normal rate and rhythm. S1,S2 noted.  No murmur, rubs or gallops noted.  No JVD or BLE edema. No carotid bruits noted. Pulmonary/Chest: Normal effort and positive vesicular breath sounds. No respiratory distress. No wheezes, rales or ronchi noted.  Neurological: Alert and oriented. Sensation intact to BLE.  BMET    Component Value Date/Time   NA 138 09/07/2023 1548   NA 141 07/16/2021 0813   K 4.4 09/07/2023 1548   CL 104 09/07/2023 1548   CO2 25 09/07/2023 1548   GLUCOSE 135 09/07/2023 1548   BUN 29 (H) 09/07/2023 1548   BUN 20 07/16/2021 0813   CREATININE 0.75 09/07/2023 1548   CALCIUM 9.9 09/07/2023 1548   GFRNONAA 56 (L) 07/01/2023 1425   GFRAA >60 11/11/2015 1340    Lipid Panel     Component Value Date/Time   CHOL 139 09/07/2023 1548   TRIG 176 (H) 09/07/2023 1548   HDL 37 (L) 09/07/2023 1548   CHOLHDL 3.8 09/07/2023 1548   VLDL 16 02/11/2023 0643   LDLCALC 75 09/07/2023 1548    CBC    Component Value Date/Time   WBC 4.3 09/07/2023 1548   RBC 5.31 09/07/2023 1548   HGB 15.3 09/07/2023 1548   HGB 14.8 04/28/2022 1559   HCT 45.5 09/07/2023 1548   HCT 43.0 04/28/2022 1559   PLT 199 09/07/2023 1548   PLT 211 04/28/2022 1559   MCV 85.7 09/07/2023 1548   MCV 86 04/28/2022 1559   MCH 28.8 09/07/2023 1548   MCHC 33.6 09/07/2023 1548   RDW 12.9 09/07/2023 1548   RDW 12.6 04/28/2022 1559   LYMPHSABS 1.2 07/01/2023 1425   LYMPHSABS 1.1 04/28/2022 1559   MONOABS 0.5 07/01/2023 1425   EOSABS 0.1 07/01/2023 1425   EOSABS 0.1 04/28/2022 1559   BASOSABS 0.0 07/01/2023 1425   BASOSABS 0.0 04/28/2022 1559     Hgb A1C Lab Results  Component Value Date   HGBA1C 7.9 (A) 12/08/2023           Assessment & Plan:       RTC in 1 months, followup chronic conditions Nicki Reaper, NP

## 2024-01-25 NOTE — Telephone Encounter (Signed)
  Chief Complaint: elevated blood sugar Symptoms: elevated blood sugar Frequency: past two weeks Pertinent Negatives: Patient denies sob, n/v,  Disposition: [] ED /[] Urgent Care (no appt availability in office) / [x] Appointment(In office/virtual)/ []  [place] [facility]/ [] Home Care/ [] Refused Recommended Disposition /[] [place] [facility]/ []  Follow-up with PCP Additional Notes: Patient states that he has been taking his blood glucose daily and the it has been ranging between 217-317. He states that he has not had a reading less than 200.  He states that he has been controlling what he eats and it is still elevated.   Copied from CRM [id number]. Topic: Clinical - Red Word Triage >> Jan 25, 2024  8:59 AM Clayton Bibles wrote: Red Word that prompted transfer to Nurse Triage: Blood sugar has been between 217 - 317 in the last 2 weeks. He is watching what he eats and drinks and still high. Reason for Disposition  New-onset diabetes suspected (e.g., abnormally thirsty, frequent urination, weight loss)  Answer Assessment - Initial Assessment Questions 1. BLOOD GLUCOSE: "What is your blood glucose level?"    260 or 280 2. ONSET: "When did you check the blood glucose?"     This morning around 6:30 3. USUAL RANGE: "What is your glucose level usually?" (e.g., usual fasting morning value, usual evening value)     217-317 has been for past 2 weeks 4. KETONES: "Do you check for ketones (urine or blood test strips)?" If Yes, ask: "What does the test show now?"      no 5. TYPE 1 or 2:  "Do you know what type of diabetes you have?"  (e.g., Type 1, Type 2, Gestational; doesn't know)      Doesn't  6. INSULIN: "Do you take insulin?" "What type of insulin(s) do you use? What is the mode of delivery? (syringe, pen; injection or pump)?"      no 7. DIABETES PILLS: "Do you take any pills for your diabetes?" If Yes, ask: "Have you missed taking any pills recently?"     no 8. OTHER SYMPTOMS: "Do you have any  symptoms?" (e.g., fever, frequent urination, difficulty breathing, dizziness, weakness, vomiting)     Loss of balance, dizziness  Protocols used: Diabetes - High Blood Sugar-A-AH

## 2024-01-25 NOTE — Assessment & Plan Note (Signed)
 Will trial metformin 1000 mg twice daily Reinforced low-carb diet and exercise for weight loss Encourage routine eye exam Encouraged routine foot exam

## 2024-02-08 ENCOUNTER — Encounter: Payer: Self-pay | Admitting: Cardiovascular Disease

## 2024-02-08 ENCOUNTER — Ambulatory Visit: Payer: Medicare Other

## 2024-02-08 DIAGNOSIS — I639 Cerebral infarction, unspecified: Secondary | ICD-10-CM

## 2024-02-08 LAB — CUP PACEART REMOTE DEVICE CHECK: Date Time Interrogation Session: 20250323231829

## 2024-02-09 NOTE — Progress Notes (Signed)
 Carelink Summary Report / Loop Recorder

## 2024-02-09 NOTE — Addendum Note (Signed)
 Addended by: Geralyn Flash D on: 02/09/2024 01:58 PM   Modules accepted: Orders

## 2024-02-12 ENCOUNTER — Other Ambulatory Visit: Payer: Self-pay | Admitting: Internal Medicine

## 2024-02-15 NOTE — Telephone Encounter (Signed)
 Requested Prescriptions  Pending Prescriptions Disp Refills   atorvastatin (LIPITOR) 40 MG tablet [Pharmacy Med Name: ATORVASTATIN 40 MG TABLET] 90 tablet 1    Sig: TAKE 1 TABLET BY MOUTH EVERY DAY     Cardiovascular:  Antilipid - Statins Failed - 02/15/2024  1:29 PM      Failed - Valid encounter within last 12 months    Recent Outpatient Visits           3 weeks ago Type 2 diabetes mellitus with hyperglycemia, without long-term current use of insulin [facility])   [place] [facility] [place], [place] W, NP   7 years ago URI with cough and congestion   [facility] at [facility], Dema Severin, PA-C       Future Appointments             In 3 weeks Lorre Munroe, NP [place] [facility], [facility]            Failed - Lipid Panel in normal range within the last 12 months    Cholesterol  Date Value Ref Range Status  09/07/2023 139 <200 mg/dL Final   LDL Cholesterol (Calc)  Date Value Ref Range Status  09/07/2023 75 mg/dL (calc) Final    Comment:    Reference range: <100 . Desirable range <100 mg/dL for primary prevention;   <70 mg/dL for patients with CHD or diabetic patients  with > or = 2 CHD risk factors. Marland Kitchen LDL-C is now calculated using the Martin-Hopkins  calculation, which is a validated novel method providing  better accuracy than the Friedewald equation in the  estimation of LDL-C.  Horald Pollen et al. Lenox Ahr. 5784;696(29): 2061-2068  (http://education.[facility].com/faq/FAQ164)    HDL  Date Value Ref Range Status  09/07/2023 37 (L) > OR = 40 mg/dL Final   Triglycerides  Date Value Ref Range Status  09/07/2023 176 (H) <150 mg/dL Final         Passed - Patient is not pregnant

## 2024-03-05 ENCOUNTER — Other Ambulatory Visit: Payer: Self-pay | Admitting: Internal Medicine

## 2024-03-07 NOTE — Telephone Encounter (Signed)
 Requested medication (s) are due for refill today: yes  Requested medication (s) are on the active medication list: yes  Last refill:  09/07/23 #180 1 RF  Future visit scheduled: yes  Notes to clinic:  med not delegated to NT to RF   Requested Prescriptions  Pending Prescriptions Disp Refills   midodrine  (PROAMATINE ) 5 MG tablet [Pharmacy Med Name: MIDODRINE  HCL 5 MG TABLET] 180 tablet 1    Sig: TAKE 1 TABLET (5 MG TOTAL) BY MOUTH 2 (TWO) TIMES DAILY WITH A MEAL.     Not Delegated - Cardiovascular: Midodrine  Failed - 03/07/2024 11:46 AM      Failed - This refill cannot be delegated      Failed - Valid encounter within last 12 months    Recent Outpatient Visits           1 month ago Type 2 diabetes mellitus with hyperglycemia, without long-term current use of insulin [facility])   [place] [facility] [place], [place] W, NP   7 years ago URI with cough and congestion   Primary Care at [facility], Clance Crigler, PA-C       Future Appointments             Tomorrow Carollynn Cirri, NP [place] [facility], [facility]            Passed - Cr in normal range and within 360 days    Creat  Date Value Ref Range Status  09/07/2023 0.75 0.70 - 1.35 mg/dL Final   Creatinine, Urine  Date Value Ref Range Status  03/02/2023 202 20 - 320 mg/dL Final         Passed - ALT in normal range and within 360 days    ALT  Date Value Ref Range Status  09/07/2023 21 9 - 46 U/L Final         Passed - AST in normal range and within 360 days    AST  Date Value Ref Range Status  09/07/2023 15 10 - 35 U/L Final         Passed - Last BP in normal range    BP Readings from Last 1 Encounters:  01/25/24 124/68

## 2024-03-08 ENCOUNTER — Ambulatory Visit (INDEPENDENT_AMBULATORY_CARE_PROVIDER_SITE_OTHER): Payer: Self-pay | Admitting: Internal Medicine

## 2024-03-08 ENCOUNTER — Encounter: Payer: Self-pay | Admitting: Internal Medicine

## 2024-03-08 VITALS — BP 122/70 | Ht 75.0 in | Wt 192.6 lb

## 2024-03-08 DIAGNOSIS — I700 Atherosclerosis of aorta: Secondary | ICD-10-CM

## 2024-03-08 DIAGNOSIS — G40209 Localization-related (focal) (partial) symptomatic epilepsy and epileptic syndromes with complex partial seizures, not intractable, without status epilepticus: Secondary | ICD-10-CM

## 2024-03-08 DIAGNOSIS — E1169 Type 2 diabetes mellitus with other specified complication: Secondary | ICD-10-CM

## 2024-03-08 DIAGNOSIS — E1165 Type 2 diabetes mellitus with hyperglycemia: Secondary | ICD-10-CM

## 2024-03-08 DIAGNOSIS — K219 Gastro-esophageal reflux disease without esophagitis: Secondary | ICD-10-CM

## 2024-03-08 DIAGNOSIS — Z8673 Personal history of transient ischemic attack (TIA), and cerebral infarction without residual deficits: Secondary | ICD-10-CM

## 2024-03-08 DIAGNOSIS — Z7984 Long term (current) use of oral hypoglycemic drugs: Secondary | ICD-10-CM

## 2024-03-08 DIAGNOSIS — E785 Hyperlipidemia, unspecified: Secondary | ICD-10-CM

## 2024-03-08 DIAGNOSIS — G43709 Chronic migraine without aura, not intractable, without status migrainosus: Secondary | ICD-10-CM | POA: Diagnosis not present

## 2024-03-08 DIAGNOSIS — I951 Orthostatic hypotension: Secondary | ICD-10-CM

## 2024-03-08 DIAGNOSIS — M8700 Idiopathic aseptic necrosis of unspecified bone: Secondary | ICD-10-CM

## 2024-03-08 DIAGNOSIS — E119 Type 2 diabetes mellitus without complications: Secondary | ICD-10-CM

## 2024-03-08 NOTE — Assessment & Plan Note (Signed)
 Continue keppra  He will need to follow with neurology

## 2024-03-08 NOTE — Assessment & Plan Note (Signed)
 C-Met and lipid profile today Encouraged him to consume a low fat diet  Continue atorvastatin , ASA

## 2024-03-08 NOTE — Assessment & Plan Note (Signed)
 A1c and urine microalbumin today Reinforced low-carb diet and exercise for weight loss Continue metformin , will adjust if needed based on labs Encourage routine eye exam Encouraged routine foot exam He declines immunizations

## 2024-03-08 NOTE — Assessment & Plan Note (Signed)
 C-Met and lipid profile today Encouraged him to consume a low-fat diet Continue atorvastatin

## 2024-03-08 NOTE — Progress Notes (Signed)
 Subjective:    Patient ID: Ralph Simpson, male    DOB: 13-Jun-1961, 63 y.o.   MRN: [id number]  HPI  Patient presents to clinic today for 9-month follow-up of chronic conditions.  AVN/OA bilateral hips: Status post bone grafting.  He does not take any medications for this.  He follows with orthopedics.  GERD: Triggered by meat.  He is not taking any medication for this.  Upper GI from 07/2019 reviewed.  Migraines: Triggered by weather changes.  These occur rarely.  He takes nurtec as needed with some relief of symptoms.  He follows with neurology.  DM2: His last A1c was 7.9%, 11/2023.  He is taking metformin  as prescribed.  His sugar range 123-160.  He does not check his feet routinely.  His last eye exam was 12/2021.  Flu never.  Pneumovax never.  COVID never.  HLD with aortic atherosclerosis status post stroke: His last LDL was 75 triglycerides 176, 08/2023.  He denies myalgias on atorvastatin .  He is taking aspirin  as well.  He tries to consume a low-fat diet.  He follows with neurology.  Seizures: He denies recent seizures.  He is taking keppra  as prescribed.  He follows with neurology.  Orthostasis: Managed with midodrine .  ECG from 04/2023 reviewed.  Review of Systems     Past Medical History:  Diagnosis Date   Allergy    Avascular necrosis of bones of both hips (HCC)    GERD (gastroesophageal reflux disease)    Migraine    Neutropenia (HCC)    Osteoarthritis of both hips    Plantar fasciitis    Seizures (HCC)    x 2 03/2021   Trochanteric bursitis of right hip     Current Outpatient Medications  Medication Sig Dispense Refill   aspirin  81 MG chewable tablet Chew 1 tablet (81 mg total) by mouth daily. 21 tablet 0   atorvastatin  (LIPITOR) 40 MG tablet TAKE 1 TABLET BY MOUTH EVERY DAY 90 tablet 1   Blood Glucose Monitoring Suppl (ONETOUCH VERIO FLEX SYSTEM) w/Device KIT USED TO CHECK BLOOD SUGAR ONCE A DAY. DX E11.9 1 kit 0   clonazePAM (KLONOPIN) 1 MG tablet Take 1 mg by  mouth 2 (two) times daily.     glucose blood (RELION GLUCOSE TEST STRIPS) test strip 1 each by Other route daily. One time daily 100 each 12   Lancet Devices (RELION LANCING DEVICE) MISC Use to check blood sugar one a day.  DX: E11.9 100 each 1   levETIRAcetam  (KEPPRA ) 500 MG tablet Take 1 tablet (500 mg total) by mouth 2 (two) times daily. (Patient taking differently: Take 500 mg by mouth 2 (two) times daily. Take 3 tablets by mouth 2 times daily) 60 tablet 6   metFORMIN  (GLUCOPHAGE ) 1000 MG tablet Take 0.5 tablets (500 mg total) by mouth 2 (two) times daily with a meal for 10 days, THEN 1 tablet (1,000 mg total) 2 (two) times daily with a meal. 170 tablet 0   midodrine  (PROAMATINE ) 5 MG tablet TAKE 1 TABLET (5 MG TOTAL) BY MOUTH 2 (TWO) TIMES DAILY WITH A MEAL. 180 tablet 1   Rimegepant Sulfate (NURTEC) 75 MG TBDP TAKE 1 TAB AT ONSET OF MIGRAINE. MAY REPEAT IN 2 HRS, IF NEEDED. MAX DOSE: 2 TABS/DAY. 24 tablet 0   No current facility-administered medications for this visit.    Allergies  Allergen Reactions   Aspirin      Causes brain bleed     Family History  Problem Relation  Age of Onset   Arthritis Mother    Stroke Mother    Diabetes Father    Heart disease Father    Hyperlipidemia Father    Hypertension Father    Kidney disease Maternal Grandfather    Colon cancer Neg Hx    Stomach cancer Neg Hx    Rectal cancer Neg Hx     Social History   Socioeconomic History   Marital status: Single    Spouse name: Not on file   Number of children: 0   Years of education: Not on file   Highest education level: 10th grade  Occupational History    Comment: NA  Tobacco Use   Smoking status: Former    Current packs/day: 1.00    Types: Cigarettes    Passive exposure: Never   Smokeless tobacco: Former    Types: Chew   Tobacco comments:    quit 10-15 years ago  Vaping Use   Vaping status: Never Used  Substance and Sexual Activity   Alcohol use: Not Currently   Drug use: No    Sexual activity: Not Currently  Other Topics Concern   Not on file  Social History Narrative   Not on file   Social Drivers of Health   Financial Resource Strain: Low Risk  (07/31/2023)   Overall Financial Resource Strain (CARDIA)    Difficulty of Paying Living Expenses: Not very hard  Food Insecurity: Low Risk  (02/09/2024)   Received from [facility]   Hunger Vital Sign    Worried About Running Out of Food in the Last Year: Never true    Ran Out of Food in the Last Year: Never true  Transportation Needs: No Transportation Needs (02/09/2024)   Received from [facility]    In the past 12 months, has lack of reliable transportation kept you from medical appointments, meetings, work or from getting things needed for daily living? : No  Physical Activity: Insufficiently Active (07/31/2023)   Exercise Vital Sign    Days of Exercise per Week: 2 days    Minutes of Exercise per Session: 20 min  Stress: No Stress Concern Present (07/31/2023)   [employer] of Occupational Health - Occupational Stress Questionnaire    Feeling of Stress : Only a little  Social Connections: Moderately Isolated (07/31/2023)   Social Connection and Isolation Panel [NHANES]    Frequency of Communication with Friends and Family: More than three times a week    Frequency of Social Gatherings with Friends and Family: Once a week    Attends Religious Services: Never    Database administrator or Organizations: Yes    Attends Banker Meetings: Never    Marital Status: Never married  Intimate Partner Violence: Not At Risk (07/31/2023)   Humiliation, Afraid, Rape, and Kick questionnaire    Fear of Current or Ex-Partner: No    Emotionally Abused: No    Physically Abused: No    Sexually Abused: No     Constitutional: Pt reports intermittent headache. Denies fever, malaise, fatigue, or abrupt weight changes.  HEENT: Denies eye pain, eye redness, ear pain, ringing in the ears, wax  buildup, runny nose, nasal congestion, bloody nose, or sore throat. Respiratory: Denies difficulty breathing, shortness of breath, cough or sputum production.   Cardiovascular: Denies chest pain, chest tightness, palpitations or swelling in the hands or feet.  Gastrointestinal: Denies abdominal pain, bloating, constipation, diarrhea or blood in the stool.  GU: Denies urgency, frequency,  pain with urination, burning sensation, blood in urine, odor or discharge. Musculoskeletal: Pt reports hip pain. Denies decrease in range of motion, difficulty with gait, muscle pain or joint swelling.  Skin: Denies redness, rashes, lesions or ulcercations.  Neurological: Pt reports dizziness. Denies difficulty with memory, difficulty with speech or problems with balance and coordination.  Psych: Denies anxiety, depression, SI/HI.  No other specific complaints in a complete review of systems (except as listed in HPI above).  Objective:   Physical Exam BP 122/70 (BP Location: Left Arm, Patient Position: Sitting, Cuff Size: Normal)   Ht 6\' 3"  (1.905 m)   Wt 192 lb 9.6 oz (87.4 kg)   BMI 24.07 kg/m    Wt Readings from Last 3 Encounters:  01/25/24 197 lb (89.4 kg)  12/08/23 202 lb 9.6 oz (91.9 kg)  09/07/23 199 lb (90.3 kg)    General: Appears his stated age, well developed, well nourished in NAD. Skin: Warm, dry and intact.  HEENT: Head: normal shape and size; Eyes: sclera white, no icterus, conjunctiva pink, PERRLA and EOMs intact;  Cardiovascular: Normal rate and rhythm. S1,S2 noted.  No murmur, rubs or gallops noted. No JVD or BLE edema. No carotid bruits noted. Pulmonary/Chest: Normal effort and positive vesicular breath sounds. No respiratory distress. No wheezes, rales or ronchi noted.  Abdomen: Limping gait with use of cane.  Neurological: Alert and oriented. Cranial nerves II-XII grossly intact. Coordination normal.  Psychiatric: Mood and affect normal. Behavior is normal. Judgment and thought  content normal.   BMET    Component Value Date/Time   NA 138 09/07/2023 1548   NA 141 07/16/2021 0813   K 4.4 09/07/2023 1548   CL 104 09/07/2023 1548   CO2 25 09/07/2023 1548   GLUCOSE 135 09/07/2023 1548   BUN 29 (H) 09/07/2023 1548   BUN 20 07/16/2021 0813   CREATININE 0.75 09/07/2023 1548   CALCIUM  9.9 09/07/2023 1548   GFRNONAA 56 (L) 07/01/2023 1425   GFRAA >60 11/11/2015 1340    Lipid Panel     Component Value Date/Time   CHOL 139 09/07/2023 1548   TRIG 176 (H) 09/07/2023 1548   HDL 37 (L) 09/07/2023 1548   CHOLHDL 3.8 09/07/2023 1548   VLDL 16 02/11/2023 0643   LDLCALC 75 09/07/2023 1548    CBC    Component Value Date/Time   WBC 4.3 09/07/2023 1548   RBC 5.31 09/07/2023 1548   HGB 15.3 09/07/2023 1548   HGB 14.8 04/28/2022 1559   HCT 45.5 09/07/2023 1548   HCT 43.0 04/28/2022 1559   PLT 199 09/07/2023 1548   PLT 211 04/28/2022 1559   MCV 85.7 09/07/2023 1548   MCV 86 04/28/2022 1559   MCH 28.8 09/07/2023 1548   MCHC 33.6 09/07/2023 1548   RDW 12.9 09/07/2023 1548   RDW 12.6 04/28/2022 1559   LYMPHSABS 1.2 07/01/2023 1425   LYMPHSABS 1.1 04/28/2022 1559   MONOABS 0.5 07/01/2023 1425   EOSABS 0.1 07/01/2023 1425   EOSABS 0.1 04/28/2022 1559   BASOSABS 0.0 07/01/2023 1425   BASOSABS 0.0 04/28/2022 1559    Hgb A1C Lab Results  Component Value Date   HGBA1C 7.9 (A) 12/08/2023           Assessment & Plan:       RTC in 6 months, followup chronic conditions Helayne Lo, NP

## 2024-03-08 NOTE — Assessment & Plan Note (Signed)
 Try to avoid foods that trigger reflux Encourage weight loss as this can produce reflux symptoms Okay to take Tums or Pepcid OTC as needed if symptoms occur

## 2024-03-08 NOTE — Assessment & Plan Note (Signed)
CMET and lipid profile today Continue atorvastatin and aspirin Encouraged low fat diet

## 2024-03-08 NOTE — Patient Instructions (Signed)

## 2024-03-08 NOTE — Assessment & Plan Note (Signed)
 Continue nurtec as needed He will need to follow with neurology

## 2024-03-08 NOTE — Assessment & Plan Note (Signed)
He will continue to follow with [facility]

## 2024-03-08 NOTE — Assessment & Plan Note (Signed)
 Continue midodrine  Encouraged adequate water intake

## 2024-03-09 LAB — HEMOGLOBIN A1C
Hgb A1c MFr Bld: 7.8 % — ABNORMAL HIGH (ref <5.7)
Mean Plasma Glucose: 177 mg/dL
eAG (mmol/L): 9.8 mmol/L

## 2024-03-09 LAB — CBC
HCT: 43.8 % (ref 38.5–50.0)
Hemoglobin: 14.7 g/dL (ref 13.2–17.1)
MCH: 28.9 pg (ref 27.0–33.0)
MCHC: 33.6 g/dL (ref 32.0–36.0)
MCV: 86.1 fL (ref 80.0–100.0)
MPV: 10 fL (ref 7.5–12.5)
Platelets: 201 10*3/uL (ref 140–400)
RBC: 5.09 10*6/uL (ref 4.20–5.80)
RDW: 13.4 % (ref 11.0–15.0)
WBC: 4.2 10*3/uL (ref 3.8–10.8)

## 2024-03-09 LAB — COMPREHENSIVE METABOLIC PANEL WITH GFR
AG Ratio: 2.3 (calc) (ref 1.0–2.5)
ALT: 28 U/L (ref 9–46)
AST: 18 U/L (ref 10–35)
Albumin: 4.9 g/dL (ref 3.6–5.1)
Alkaline phosphatase (APISO): 68 U/L (ref 35–144)
BUN/Creatinine Ratio: 38 (calc) — ABNORMAL HIGH (ref 6–22)
BUN: 28 mg/dL — ABNORMAL HIGH (ref 7–25)
CO2: 27 mmol/L (ref 20–32)
Calcium: 10 mg/dL (ref 8.6–10.3)
Chloride: 104 mmol/L (ref 98–110)
Creat: 0.73 mg/dL (ref 0.70–1.35)
Globulin: 2.1 g/dL (ref 1.9–3.7)
Glucose, Bld: 112 mg/dL (ref 65–139)
Potassium: 4.6 mmol/L (ref 3.5–5.3)
Sodium: 139 mmol/L (ref 135–146)
Total Bilirubin: 0.6 mg/dL (ref 0.2–1.2)
Total Protein: 7 g/dL (ref 6.1–8.1)
eGFR: 101 mL/min/{1.73_m2} (ref >=60)

## 2024-03-09 LAB — LIPID PANEL
Cholesterol: 134 mg/dL (ref <200)
HDL: 38 mg/dL — ABNORMAL LOW (ref >=40)
LDL Cholesterol (Calc): 77 mg/dL
Non-HDL Cholesterol (Calc): 96 mg/dL (ref <130)
Total CHOL/HDL Ratio: 3.5 (calc) (ref <5.0)
Triglycerides: 109 mg/dL (ref <150)

## 2024-03-09 LAB — MICROALBUMIN / CREATININE URINE RATIO
Creatinine, Urine: 165 mg/dL (ref 20–320)
Microalb Creat Ratio: 13 mg/g{creat} (ref <30)
Microalb, Ur: 2.2 mg/dL

## 2024-03-09 MED ORDER — GLIPIZIDE ER 5 MG PO TB24: 5.0000 mg | ORAL_TABLET | Freq: Every day | ORAL | 1 refills | Status: DC

## 2024-03-09 NOTE — Addendum Note (Signed)
 Addended by: Carollynn Cirri on: 03/09/2024 09:10 AM   Modules accepted: Orders

## 2024-03-10 ENCOUNTER — Other Ambulatory Visit: Payer: Medicare Other

## 2024-03-14 ENCOUNTER — Ambulatory Visit: Payer: Medicare Other

## 2024-03-14 ENCOUNTER — Encounter: Payer: Self-pay | Admitting: Cardiovascular Disease

## 2024-03-14 DIAGNOSIS — I639 Cerebral infarction, unspecified: Secondary | ICD-10-CM | POA: Diagnosis not present

## 2024-03-14 LAB — CUP PACEART REMOTE DEVICE CHECK: Date Time Interrogation Session: 20250427232109

## 2024-03-18 ENCOUNTER — Ambulatory Visit (INDEPENDENT_AMBULATORY_CARE_PROVIDER_SITE_OTHER): Admitting: Family Medicine

## 2024-03-18 ENCOUNTER — Encounter: Payer: Self-pay | Admitting: Family Medicine

## 2024-03-18 ENCOUNTER — Ambulatory Visit: Payer: Self-pay

## 2024-03-18 VITALS — BP 110/68 | HR 89 | Ht 75.0 in | Wt 193.4 lb

## 2024-03-18 DIAGNOSIS — Z7984 Long term (current) use of oral hypoglycemic drugs: Secondary | ICD-10-CM

## 2024-03-18 DIAGNOSIS — R42 Dizziness and giddiness: Secondary | ICD-10-CM | POA: Diagnosis not present

## 2024-03-18 DIAGNOSIS — E1165 Type 2 diabetes mellitus with hyperglycemia: Secondary | ICD-10-CM | POA: Diagnosis not present

## 2024-03-18 MED ORDER — METFORMIN HCL ER 500 MG PO TB24: 500.0000 mg | ORAL_TABLET | Freq: Every day | ORAL | 0 refills | Status: DC

## 2024-03-18 MED ORDER — MECLIZINE HCL 25 MG PO TABS: 25.0000 mg | ORAL_TABLET | Freq: Three times a day (TID) | ORAL | 1 refills | Status: DC | PRN

## 2024-03-18 NOTE — Progress Notes (Signed)
 Subjective:    Patient ID: Ralph Simpson, male    DOB: [birth year], 63 y.o.   MRN: [id number]  Ralph Simpson is a 63 y.o. male presenting on 03/18/2024 for Dizziness and Diabetes  Patient presents for a same day appointment.  PCP Helayne Lo, FNP    HPI  Discussed the use of AI scribe software for clinical note transcription with the patient, who gave verbal consent to proceed.  History of Present Illness   Ralph Simpson is a 63 year old male with diabetes who presents with dizziness and concerns about medication side effects.  He has been experiencing dizziness for the past two weeks, initially attributing it to allergies but now suspecting it may be a side effect of his medications. He describes feeling 'out of it' and with symptoms improving slightly before his next medication dose. He experiences dizziness upon standing quickly and notes that his blood pressure was checked during a dizzy spell and was not too high or too low.  He has history of orthostasis and is on midodrine . He has this issue with position change, but says it is not related to the current, more persistent dizziness feeling now.  He recently started taking metformin  at a dose of 1000 mg twice daily for blood sugar management, having gradually increased from 500 mg twice daily. His blood sugar levels have been fluctuating between 130 and 161 mg/dL. He has not started taking glipizide , which was prescribed recently. He experiences gastrointestinal side effects from metformin , including stomach rumbling and diarrhea, which he believes may contribute to not feeling well and dizziness. He also reports a loss of appetite and tiredness since increasing his dose.  He has a history of seizures and is on medication for this condition, which has not changed recently. He mentions experiencing vertigo in the past and associates dizziness with his migraine episodes, although he states that this current dizziness feels different. He  has a history of migraines, which can cause dizziness and room spinning. He reports having a migraine a few days ago but does not believe it is related to his current dizziness.           03/08/2024    2:12 PM 01/25/2024   12:53 PM 07/31/2023    9:03 AM  Depression screen PHQ 2/9  Decreased Interest 0 3   Down, Depressed, Hopeless 2 1 1   PHQ - 2 Score 2 4 1   Altered sleeping 3 3   Tired, decreased energy 3    Change in appetite 2 0   Feeling bad or failure about yourself  1 0   Trouble concentrating 1 3   Moving slowly or fidgety/restless 0 1   Suicidal thoughts 0 0   PHQ-9 Score 12 11   Difficult doing work/chores Somewhat difficult Somewhat difficult        03/08/2024    2:13 PM 01/25/2024   12:53 PM 06/12/2022   11:10 AM  GAD 7 : Generalized Anxiety Score  Nervous, Anxious, on Edge 1 1 3   Control/stop worrying 3 3 3   Worry too much - different things 3 3 2   Trouble relaxing 2 2 2   Restless 1 1 3   Easily annoyed or irritable 2 2 1   Afraid - awful might happen 2 2 2   Total GAD 7 Score 14 14 16   Anxiety Difficulty Somewhat difficult Somewhat difficult     Social History   Tobacco Use   Smoking status: Former    Current packs/day:  1.00    Types: Cigarettes    Passive exposure: Never   Smokeless tobacco: Former    Types: Chew   Tobacco comments:    quit 10-15 years ago  Vaping Use   Vaping status: Never Used  Substance Use Topics   Alcohol use: Not Currently   Drug use: No    Review of Systems Per HPI unless specifically indicated above     Objective:    BP 110/68 (BP Location: Left Arm, Patient Position: Sitting, Cuff Size: Normal)   Pulse 89   Ht 6\' 3"  (1.905 m)   Wt 193 lb 6 oz (87.7 kg)   SpO2 95%   BMI 24.17 kg/m   Wt Readings from Last 3 Encounters:  03/18/24 193 lb 6 oz (87.7 kg)  03/08/24 192 lb 9.6 oz (87.4 kg)  01/25/24 197 lb (89.4 kg)    Physical Exam Vitals and nursing note reviewed.  Constitutional:      General: He is not in  acute distress.    Appearance: He is well-developed. He is not diaphoretic.     Comments: Well-appearing, comfortable, cooperative  HENT:     Head: Normocephalic and atraumatic.     Right Ear: Tympanic membrane, ear canal and external ear normal. There is no impacted cerumen.     Left Ear: Tympanic membrane, ear canal and external ear normal. There is no impacted cerumen.  Eyes:     General:        Right eye: No discharge.        Left eye: No discharge.     Conjunctiva/sclera: Conjunctivae normal.  Neck:     Thyroid: No thyromegaly.  Cardiovascular:     Rate and Rhythm: Normal rate and regular rhythm.     Pulses: Normal pulses.     Heart sounds: Normal heart sounds. No murmur heard. Pulmonary:     Effort: Pulmonary effort is normal. No respiratory distress.     Breath sounds: Normal breath sounds. No wheezing or rales.  Musculoskeletal:        General: Normal range of motion.     Cervical back: Normal range of motion and neck supple.  Lymphadenopathy:     Cervical: No cervical adenopathy.  Skin:    General: Skin is warm and dry.     Findings: No erythema or rash.  Neurological:     Mental Status: He is alert and oriented to person, place, and time. Mental status is at baseline.  Psychiatric:        Behavior: Behavior normal.     Comments: Well groomed, good eye contact, normal speech and thoughts     Results for orders placed or performed in visit on 03/14/24  CUP PACEART REMOTE DEVICE CHECK   Collection Time: 03/13/24 11:21 PM  Result Value Ref Range   Date Time Interrogation Session [phone]    Pulse Generator Manufacturer MERM    Pulse Gen Model LNQ22 LINQ II    Pulse Gen Serial Number [id number]    Clinic Name [facility]    Implantable Pulse Generator Type ICM/ILR       Assessment & Plan:   Problem List Items Addressed This Visit     Type 2 diabetes mellitus with hyperglycemia, without long-term current use of insulin (HCC) - Primary   Relevant  Medications   metFORMIN  (GLUCOPHAGE -XR) 500 MG 24 hr tablet   Other Visit Diagnoses       Dizziness       Relevant Medications  meclizine  (ANTIVERT ) 25 MG tablet     Long term current use of oral hypoglycemic drug           Dizziness Likely drug related side effect based on timing, related to metformin  use, especially after dose escalation. Differential includes medication side effects, vertigo, and hypotension. History of vertigo and migraines may contribute. He has known orthostatic, and is on midodrine , but that has been stable for >6 months, and seems inconsistent with this history  - Pause metformin  for one week to assess if dizziness resolves. - will rx Metformin  XR 500mg  once daily 30 day trial to use if the problem resolves and he prefers different version - Discontinue Glipizide  sulfonylurea due to caution with hypoglycemia more dizziness for now.  - Prescribe meclizine  for dizziness, to be taken up to three times daily as needed. - Report any changes in dizziness symptoms.  Type 2 Diabetes Mellitus Recent metformin  initiation causing dizziness and gastrointestinal side effects. Blood glucose levels fluctuate between 130-160 mg/dL. Dizziness suspected to be metformin -related, especially post-dose escalation. See above  Migraine Recent migraine reported but not associated with current dizziness.  Seizure Disorder Managed with current medication regimen. Concern about potential interactions with new medications.         No orders of the defined types were placed in this encounter.   Meds ordered this encounter  Medications   metFORMIN  (GLUCOPHAGE -XR) 500 MG 24 hr tablet    Sig: Take 1 tablet (500 mg total) by mouth daily with breakfast.    Dispense:  30 tablet    Refill:  0   meclizine  (ANTIVERT ) 25 MG tablet    Sig: Take 1 tablet (25 mg total) by mouth 3 (three) times daily as needed for dizziness.    Dispense:  90 tablet    Refill:  1    Follow up  plan: Return if symptoms worsen or fail to improve.   Domingo Friend, DO [facility] 03/18/2024, 3:12 PM

## 2024-03-18 NOTE — Patient Instructions (Addendum)
 Thank you for coming to the office today.  Dizzinses can be medication side effect.  STOP Glipizide  - don't take this one. It can run sugar lower.  PAUSE Metformin  immediate release 1000mg  - completely, stop taking for about 1 week, see if you feel better.  NEW order Metformin  XR 500mg  once daily with meal - once you feel better start this one to trial it out.  ----------------  If you do like the original one better, you can try the half pill. But maybe the new one works better  -------------------   1. You have symptoms of Vertigo (Benign Paroxysmal Positional Vertigo) - This is commonly caused by inner ear fluid imbalance, sometimes can be worsened by allergies and sinus symptoms, otherwise it can occur randomly sometimes and we may never discover the exact cause. - To treat this, try the Epley Manuever (see diagrams/instructions below) at home up to 3 times a day for 1-2 weeks or until symptoms resolve - You may take Meclizine  as needed up to 3 times a day for dizziness, this will not cure symptoms but may help. Caution may make you drowsy.  If you develop significant worsening episode with vertigo that does not improve and you get severe headache, loss of vision, arm or leg weakness, slurred speech, or other concerning symptoms please seek immediate medical attention at Emergency Department.     ----------------------------------------------------------------------------------------------------------------------         Please schedule a Follow-up Appointment to: Return if symptoms worsen or fail to improve.  If you have any other questions or concerns, please feel free to call the office or send a message through MyChart. You may also schedule an earlier appointment if necessary.  Additionally, you may be receiving a survey about your experience at our office within a few days to 1 week by e-mail or mail. We value your feedback.  Domingo Friend, DO [facility], [place]

## 2024-03-18 NOTE — Telephone Encounter (Signed)
 Copied from CRM [id number]. Topic: Clinical - Red Word Triage >> Mar 18, 2024  1:56 PM Ralph Simpson wrote: Patient says he's been dizzy and thinks it's the Metformin  for a couple weeks.   Chief Complaint: Dizziness  Symptoms: Dizziness, some fatigue  Pertinent Negatives: Patient denies any increased confusion  Disposition: [] ED /[] Urgent Care (no appt availability in office) / [x] Appointment(In office/virtual)/ []  [place] [facility]/ [] Home Care/ [] Refused Recommended Disposition /[] [facility]/ []  Follow-up with PCP Additional Notes: Patient reports he has been experiencing dizziness for the last 2 week. He states that he initially thought it was due to pollen but states his symptoms have been worsening and now believes it may be due to his Metformin . Patient states he is also experiencing some increased fatigue with his dizziness. HE denies any vertigo or increased confusion. Appointment made for the patient today for evaluation.     Reason for Disposition  [1] MODERATE dizziness (e.g., interferes with normal activities) AND [2] has NOT been evaluated by doctor (or NP/PA) for this  (Exception: Dizziness caused by heat exposure, sudden standing, or poor fluid intake.)  Answer Assessment - Initial Assessment Questions 1. DESCRIPTION: "Describe your dizziness."     Feels woozy and tried  2. LIGHTHEADED: "Do you feel lightheaded?" (e.g., somewhat faint, woozy, weak upon standing)     Yes 3. VERTIGO: "Do you feel like either you or the room is spinning or tilting?" (i.e. vertigo)     No 4. SEVERITY: "How bad is it?"  "Do you feel like you are going to faint?" "Can you stand and walk?"   - MILD: Feels slightly dizzy, but walking normally.   - MODERATE: Feels unsteady when walking, but not falling; interferes with normal activities (e.g., school, work).   - SEVERE: Unable to walk without falling, or requires assistance to walk without falling; feels like passing out now.      Moderate   5. ONSET:  "When did the dizziness begin?"     A couple of weeks  6. AGGRAVATING FACTORS: "Does anything make it worse?" (e.g., standing, change in head position)     Standing up worsens dizziness  7. HEART RATE: "Can you tell me your heart rate?" "How many beats in 15 seconds?"  (Note: not all patients can do this)       Orthostatic hypotension  8. CAUSE: "What do you think is causing the dizziness?"     Believes it may be due to Metformin   9. RECURRENT SYMPTOM: "Have you had dizziness before?" If Yes, ask: "When was the last time?" "What happened that time?"     Yes, but not this bad  10. OTHER SYMPTOMS: "Do you have any other symptoms?" (e.g., fever, chest pain, vomiting, diarrhea, bleeding)       Fatigue  Protocols used: Dizziness - Lightheadedness-A-AH

## 2024-03-24 NOTE — Progress Notes (Signed)
 Carelink Summary Report / Loop Recorder

## 2024-03-28 ENCOUNTER — Telehealth: Payer: Self-pay

## 2024-03-28 NOTE — Telephone Encounter (Signed)
 He saw Dr. Linnell Richardson recently. Metformin  was paused due to side effects and he was changed to Metformin  XR 500 mg daily and glipizide  d/c'd. Has he been having any dizziness on metformin  XR? If not, okay to take up to 2 tabs daily totaling 1000 mg XR

## 2024-03-28 NOTE — Telephone Encounter (Signed)
 Copied from CRM [id number]. Topic: Clinical - Medication Question >> Mar 28, 2024  3:46 PM Zipporah Him wrote: Reason for CRM:  [id number], patient is having high blood sugar, it has gradually increased over time was at 250 at the highest earlier today, now down to 195. Wants to ask Miss Leward Record if he is able to up his dosage of the metformin . Please call to advise asap per patients request.

## 2024-03-29 NOTE — Telephone Encounter (Signed)
 Patient notified if he is not having dizziness he can take 2 Metformin  xr. Verbal understanding.

## 2024-04-09 ENCOUNTER — Other Ambulatory Visit: Payer: Self-pay | Admitting: Family Medicine

## 2024-04-09 ENCOUNTER — Other Ambulatory Visit: Payer: Self-pay | Admitting: Internal Medicine

## 2024-04-09 DIAGNOSIS — E1165 Type 2 diabetes mellitus with hyperglycemia: Secondary | ICD-10-CM

## 2024-04-13 NOTE — Telephone Encounter (Signed)
 Requested Prescriptions  Pending Prescriptions Disp Refills   metFORMIN  (GLUCOPHAGE -XR) 500 MG 24 hr tablet [Pharmacy Med Name: METFORMIN  HCL ER 500 MG TABLET] 90 tablet 0    Sig: TAKE 1 TABLET BY MOUTH EVERY DAY WITH BREAKFAST     Endocrinology:  Diabetes - Biguanides Failed - 04/13/2024  9:48 AM      Failed - Valid encounter within last 6 months    Recent Outpatient Visits           3 weeks ago Type 2 diabetes mellitus with hyperglycemia, without long-term current use of insulin (HCC)   [place] [facility] [place], Kayleen Party, DO   1 month ago Type 2 diabetes mellitus with hyperglycemia, without long-term current use of insulin (HCC)   [place] [facility] [place], [place] W, NP   2 months ago Type 2 diabetes mellitus with hyperglycemia, without long-term current use of insulin [facility])   [place] [facility] [place], [place] W, NP   7 years ago URI with cough and congestion   [facility], Clance Crigler, PA-C              Passed - Cr in normal range and within 360 days    Creat  Date Value Ref Range Status  03/08/2024 0.73 0.70 - 1.35 mg/dL Final   Creatinine, Urine  Date Value Ref Range Status  03/08/2024 165 20 - 320 mg/dL Final         Passed - HBA1C is between 0 and 7.9 and within 180 days    Hgb A1c MFr Bld  Date Value Ref Range Status  03/08/2024 7.8 (H) <5.7 % Final    Comment:    For someone without known diabetes, a hemoglobin A1c value of 6.5% or greater indicates that they may have  diabetes and this should be confirmed with a follow-up  test. . For someone with known diabetes, a value <7% indicates  that their diabetes is well controlled and a value  greater than or equal to 7% indicates suboptimal  control. A1c targets should be individualized based on  duration of diabetes, age, comorbid conditions, and  other considerations. . Currently, no consensus exists regarding use of hemoglobin  A1c for diagnosis of diabetes for children. .          Passed - eGFR in normal range and within 360 days    GFR calc Af Amer  Date Value Ref Range Status  11/11/2015 >60 >60 mL/min Final    Comment:    (NOTE) The eGFR has been calculated using the CKD EPI equation. This calculation has not been validated in all clinical situations. eGFR's persistently <60 mL/min signify possible Chronic Kidney Disease.    GFR, Estimated  Date Value Ref Range Status  07/01/2023 56 (L) >60 mL/min Final    Comment:    (NOTE) Calculated using the CKD-EPI Creatinine Equation (2021)    GFR  Date Value Ref Range Status  03/05/2020 110.95 >60.00 mL/min Final   eGFR  Date Value Ref Range Status  03/08/2024 101 > OR = 60 mL/min/1.53m2 Final  11/05/2022 97 >59 mL/min/1.73 Final         Passed - B12 Level in normal range and within 720 days    Vitamin B-12  Date Value Ref Range Status  06/30/2022 850 180 - 914 pg/mL Final    Comment:    (NOTE) This assay is not validated for testing neonatal or myeloproliferative  syndrome specimens for Vitamin B12 levels. Performed at [facility], [address], [place], [place] [address]          Passed - CBC within normal limits and completed in the last 12 months    WBC  Date Value Ref Range Status  03/08/2024 4.2 3.8 - 10.8 Thousand/uL Final   RBC  Date Value Ref Range Status  03/08/2024 5.09 4.20 - 5.80 Million/uL Final   Hemoglobin  Date Value Ref Range Status  03/08/2024 14.7 13.2 - 17.1 g/dL Final  96/29/5284 13.2 13.0 - 17.7 g/dL Final   HCT  Date Value Ref Range Status  03/08/2024 43.8 38.5 - 50.0 % Final   Hematocrit  Date Value Ref Range Status  04/28/2022 43.0 37.5 - 51.0 % Final   MCHC  Date Value Ref Range Status  03/08/2024 33.6 32.0 - 36.0 g/dL Final    Comment:    For adults, a slight decrease in the calculated MCHC value (in the range of 30 to 32 g/dL) is most likely not clinically significant; however, it  should be interpreted with caution in correlation with other red cell parameters and the patient's clinical condition.    [facility]  Date Value Ref Range Status  03/08/2024 28.9 27.0 - 33.0 pg Final   MCV  Date Value Ref Range Status  03/08/2024 86.1 80.0 - 100.0 fL Final  04/28/2022 86 79 - 97 fL Final   No results found for: "PLTCOUNTKUC", "LABPLAT", "POCPLA" RDW  Date Value Ref Range Status  03/08/2024 13.4 11.0 - 15.0 % Final  04/28/2022 12.6 11.6 - 15.4 % Final

## 2024-04-13 NOTE — Telephone Encounter (Signed)
 Requested medication (s) are due for refill today: yes  Requested medication (s) are on the active medication list: yes  Last refill:  03/18/24  Future visit scheduled: yes  Notes to clinic:  patient trying metformin  as a trial for 30 days, routing for approval from PCP to continue refilling.     Requested Prescriptions  Pending Prescriptions Disp Refills   metFORMIN  (GLUCOPHAGE ) 1000 MG tablet [Pharmacy Med Name: METFORMIN  HCL 1,000 MG TABLET] 170 tablet 0    Sig: Take 0.5 tablets (500 mg total) by mouth 2 (two) times daily with a meal for 10 days, THEN 1 tablet (1,000 mg total) 2 (two) times daily with a meal.     Endocrinology:  Diabetes - Biguanides Failed - 04/13/2024  9:40 AM      Failed - Valid encounter within last 6 months    Recent Outpatient Visits           3 weeks ago Type 2 diabetes mellitus with hyperglycemia, without long-term current use of insulin (HCC)   [place] [facility] [place], Kayleen Party, DO   1 month ago Type 2 diabetes mellitus with hyperglycemia, without long-term current use of insulin (HCC)   [place] [facility] [place], [place] W, NP   2 months ago Type 2 diabetes mellitus with hyperglycemia, without long-term current use of insulin [facility])   [place] [facility] [place], [place] W, NP   7 years ago URI with cough and congestion   [facility], Clance Crigler, PA-C              Passed - Cr in normal range and within 360 days    Creat  Date Value Ref Range Status  03/08/2024 0.73 0.70 - 1.35 mg/dL Final   Creatinine, Urine  Date Value Ref Range Status  03/08/2024 165 20 - 320 mg/dL Final         Passed - HBA1C is between 0 and 7.9 and within 180 days    Hgb A1c MFr Bld  Date Value Ref Range Status  03/08/2024 7.8 (H) <5.7 % Final    Comment:    For someone without known diabetes, a hemoglobin A1c value of 6.5% or greater indicates that they may have  diabetes and this  should be confirmed with a follow-up  test. . For someone with known diabetes, a value <7% indicates  that their diabetes is well controlled and a value  greater than or equal to 7% indicates suboptimal  control. A1c targets should be individualized based on  duration of diabetes, age, comorbid conditions, and  other considerations. . Currently, no consensus exists regarding use of hemoglobin A1c for diagnosis of diabetes for children. .          Passed - eGFR in normal range and within 360 days    GFR calc Af Amer  Date Value Ref Range Status  11/11/2015 >60 >60 mL/min Final    Comment:    (NOTE) The eGFR has been calculated using the CKD EPI equation. This calculation has not been validated in all clinical situations. eGFR's persistently <60 mL/min signify possible Chronic Kidney Disease.    GFR, Estimated  Date Value Ref Range Status  07/01/2023 56 (L) >60 mL/min Final    Comment:    (NOTE) Calculated using the CKD-EPI Creatinine Equation (2021)    GFR  Date Value Ref Range Status  03/05/2020 110.95 >60.00 mL/min Final   eGFR  Date  Value Ref Range Status  03/08/2024 101 > OR = 60 mL/min/1.86m2 Final  11/05/2022 97 >59 mL/min/1.73 Final         Passed - B12 Level in normal range and within 720 days    Vitamin B-12  Date Value Ref Range Status  06/30/2022 850 180 - 914 pg/mL Final    Comment:    (NOTE) This assay is not validated for testing neonatal or myeloproliferative syndrome specimens for Vitamin B12 levels. Performed at [facility], [address], [place], [place] [address]          Passed - CBC within normal limits and completed in the last 12 months    WBC  Date Value Ref Range Status  03/08/2024 4.2 3.8 - 10.8 Thousand/uL Final   RBC  Date Value Ref Range Status  03/08/2024 5.09 4.20 - 5.80 Million/uL Final   Hemoglobin  Date Value Ref Range Status  03/08/2024 14.7 13.2 - 17.1 g/dL Final  95/62/1308 65.7 13.0 - 17.7 g/dL Final    HCT  Date Value Ref Range Status  03/08/2024 43.8 38.5 - 50.0 % Final   Hematocrit  Date Value Ref Range Status  04/28/2022 43.0 37.5 - 51.0 % Final   MCHC  Date Value Ref Range Status  03/08/2024 33.6 32.0 - 36.0 g/dL Final    Comment:    For adults, a slight decrease in the calculated MCHC value (in the range of 30 to 32 g/dL) is most likely not clinically significant; however, it should be interpreted with caution in correlation with other red cell parameters and the patient's clinical condition.    [facility]  Date Value Ref Range Status  03/08/2024 28.9 27.0 - 33.0 pg Final   MCV  Date Value Ref Range Status  03/08/2024 86.1 80.0 - 100.0 fL Final  04/28/2022 86 79 - 97 fL Final   No results found for: "PLTCOUNTKUC", "LABPLAT", "POCPLA" RDW  Date Value Ref Range Status  03/08/2024 13.4 11.0 - 15.0 % Final  04/28/2022 12.6 11.6 - 15.4 % Final

## 2024-04-18 ENCOUNTER — Ambulatory Visit (INDEPENDENT_AMBULATORY_CARE_PROVIDER_SITE_OTHER)

## 2024-04-18 ENCOUNTER — Ambulatory Visit: Payer: Self-pay | Admitting: Cardiovascular Disease

## 2024-04-18 DIAGNOSIS — I639 Cerebral infarction, unspecified: Secondary | ICD-10-CM

## 2024-04-18 LAB — CUP PACEART REMOTE DEVICE CHECK: Date Time Interrogation Session: 20250601233144

## 2024-04-22 ENCOUNTER — Other Ambulatory Visit: Payer: Self-pay

## 2024-04-28 NOTE — Progress Notes (Signed)
 Carelink Summary Report / Loop Recorder

## 2024-05-12 ENCOUNTER — Other Ambulatory Visit: Payer: Self-pay | Admitting: Family Medicine

## 2024-05-12 DIAGNOSIS — R42 Dizziness and giddiness: Secondary | ICD-10-CM

## 2024-05-13 NOTE — Telephone Encounter (Signed)
 Requested medication (s) are due for refill today-yes  Requested medication (s) are on the active medication list -yes  Future visit scheduled -no  Last refill: 03/18/24 #90 1RF  Notes to clinic: non delegated Rx  Requested Prescriptions  Pending Prescriptions Disp Refills   meclizine  (ANTIVERT ) 25 MG tablet [Pharmacy Med Name: MECLIZINE  25 MG TABLET] 90 tablet 1    Sig: TAKE 1 TABLET BY MOUTH 3 TIMES DAILY AS NEEDED FOR DIZZINESS.     Not Delegated - Gastroenterology: Antiemetics Failed - 05/13/2024 10:54 AM      Failed - This refill cannot be delegated      Failed - Valid encounter within last 6 months    Recent Outpatient Visits           1 month ago Type 2 diabetes mellitus with hyperglycemia, without long-term current use of insulin [facility])   [place] [facility] [place], Marsa PARAS, DO   2 months ago Type 2 diabetes mellitus with hyperglycemia, without long-term current use of insulin [facility] [place] [facility])   [place] [place], [place] W, NP   3 months ago Type 2 diabetes mellitus with hyperglycemia, without long-term current use of insulin [facility])   [place] [facility] [place], [place] W, NP   7 years ago URI with cough and congestion   [facility] [place] [facility], Lauraine CROME, PA-C                 Requested Prescriptions  Pending Prescriptions Disp Refills   meclizine  (ANTIVERT ) 25 MG tablet [Pharmacy Med Name: MECLIZINE  25 MG TABLET] 90 tablet 1    Sig: TAKE 1 TABLET BY MOUTH 3 TIMES DAILY AS NEEDED FOR DIZZINESS.     Not Delegated - Gastroenterology: Antiemetics Failed - 05/13/2024 10:54 AM      Failed - This refill cannot be delegated      Failed - Valid encounter within last 6 months    Recent Outpatient Visits           1 month ago Type 2 diabetes mellitus with hyperglycemia, without long-term current use of insulin [facility])   [place] [facility] [place], Marsa PARAS, DO   2 months ago  Type 2 diabetes mellitus with hyperglycemia, without long-term current use of insulin [facility])   [place] [facility], [place] W, NP   3 months ago Type 2 diabetes mellitus with hyperglycemia, without long-term current use of insulin [facility])   [place] [facility] [place], [place] W, NP   7 years ago URI with cough and congestion   [facility] [place] [facility], Lauraine CROME, PA-C

## 2024-05-19 ENCOUNTER — Ambulatory Visit (INDEPENDENT_AMBULATORY_CARE_PROVIDER_SITE_OTHER)

## 2024-05-19 ENCOUNTER — Ambulatory Visit: Payer: Self-pay | Admitting: Cardiovascular Disease

## 2024-05-19 DIAGNOSIS — I639 Cerebral infarction, unspecified: Secondary | ICD-10-CM | POA: Diagnosis not present

## 2024-05-19 LAB — CUP PACEART REMOTE DEVICE CHECK: Date Time Interrogation Session: 20250702234211

## 2024-05-24 ENCOUNTER — Other Ambulatory Visit: Payer: Self-pay | Admitting: Internal Medicine

## 2024-05-24 DIAGNOSIS — E1165 Type 2 diabetes mellitus with hyperglycemia: Secondary | ICD-10-CM

## 2024-05-24 NOTE — Telephone Encounter (Unsigned)
 Copied from CRM [id number]. Topic: Clinical - Medication Refill >> May 24, 2024  4:10 PM Ameerah G wrote: Medication: metFORMIN  (GLUCOPHAGE -XR) 500 MG 24 hr tablet [[id number]] patient stated the provider told him to take 2 pills a day.  Has the patient contacted their pharmacy? Yes (Agent: If no, request that the patient contact the pharmacy for the refill. If patient does not wish to contact the pharmacy document the reason why and proceed with request.) (Agent: If yes, when and what did the pharmacy advise?)  This is the patient's preferred pharmacy:  [facility] [phone] [facility] [place] [address] [place] [address] Phone: [phone] Fax: [phone]  Is this the correct pharmacy for this prescription? Yes If no, delete pharmacy and type the correct one.   Has the prescription been filled recently? No  Is the patient out of the medication? Yes  Has the patient been seen for an appointment in the last year OR does the patient have an upcoming appointment? Yes  Can we respond through MyChart? No  Agent: Please be advised that Rx refills may take up to 3 business days. We ask that you follow-up with your pharmacy.

## 2024-05-26 NOTE — Telephone Encounter (Signed)
 Requested Prescriptions  Pending Prescriptions Disp Refills   metFORMIN  (GLUCOPHAGE -XR) 500 MG 24 hr tablet [Pharmacy Med Name: METFORMIN  HCL ER 500 MG TABLET] 90 tablet 0    Sig: TAKE 1 TABLET BY MOUTH EVERY DAY WITH BREAKFAST     Endocrinology:  Diabetes - Biguanides Passed - 05/26/2024  3:08 PM      Passed - Cr in normal range and within 360 days    Creat  Date Value Ref Range Status  03/08/2024 0.73 0.70 - 1.35 mg/dL Final   Creatinine, Urine  Date Value Ref Range Status  03/08/2024 165 20 - 320 mg/dL Final         Passed - HBA1C is between 0 and 7.9 and within 180 days    Hgb A1c MFr Bld  Date Value Ref Range Status  03/08/2024 7.8 (H) <5.7 % Final    Comment:    For someone without known diabetes, a hemoglobin A1c value of 6.5% or greater indicates that they may have  diabetes and this should be confirmed with a follow-up  test. . For someone with known diabetes, a value <7% indicates  that their diabetes is well controlled and a value  greater than or equal to 7% indicates suboptimal  control. A1c targets should be individualized based on  duration of diabetes, age, comorbid conditions, and  other considerations. . Currently, no consensus exists regarding use of hemoglobin A1c for diagnosis of diabetes for children. .          Passed - eGFR in normal range and within 360 days    GFR calc Af Amer  Date Value Ref Range Status  11/11/2015 >60 >60 mL/min Final    Comment:    (NOTE) The eGFR has been calculated using the CKD EPI equation. This calculation has not been validated in all clinical situations. eGFR's persistently <60 mL/min signify possible Chronic Kidney Disease.    GFR, Estimated  Date Value Ref Range Status  07/01/2023 56 (L) >60 mL/min Final    Comment:    (NOTE) Calculated using the CKD-EPI Creatinine Equation (2021)    GFR  Date Value Ref Range Status  03/05/2020 110.95 >60.00 mL/min Final   eGFR  Date Value Ref Range Status   03/08/2024 101 > OR = 60 mL/min/1.48m2 Final  11/05/2022 97 >59 mL/min/1.73 Final         Passed - B12 Level in normal range and within 720 days    Vitamin B-12  Date Value Ref Range Status  06/30/2022 850 180 - 914 pg/mL Final    Comment:    (NOTE) This assay is not validated for testing neonatal or myeloproliferative syndrome specimens for Vitamin B12 levels. Performed at [facility], [address], [place], [place] [address]          Passed - Valid encounter within last 6 months    Recent Outpatient Visits           2 months ago Type 2 diabetes mellitus with hyperglycemia, without long-term current use of insulin [facility])   [place] [facility] [place], Marsa PARAS, DO   2 months ago Type 2 diabetes mellitus with hyperglycemia, without long-term current use of insulin [facility])   [place] [facility] [place], [place] W, NP   4 months ago Type 2 diabetes mellitus with hyperglycemia, without long-term current use of insulin [facility])   [place] [facility] [place], Angeline ORN, NP   7 years ago  URI with cough and congestion   [facility] [place] [facility], Lauraine CROME, PA-C              Passed - CBC within normal limits and completed in the last 12 months    WBC  Date Value Ref Range Status  03/08/2024 4.2 3.8 - 10.8 Thousand/uL Final   RBC  Date Value Ref Range Status  03/08/2024 5.09 4.20 - 5.80 Million/uL Final   Hemoglobin  Date Value Ref Range Status  03/08/2024 14.7 13.2 - 17.1 g/dL Final  93/87/7976 85.1 13.0 - 17.7 g/dL Final   HCT  Date Value Ref Range Status  03/08/2024 43.8 38.5 - 50.0 % Final   Hematocrit  Date Value Ref Range Status  04/28/2022 43.0 37.5 - 51.0 % Final   MCHC  Date Value Ref Range Status  03/08/2024 33.6 32.0 - 36.0 g/dL Final    Comment:    For adults, a slight decrease in the calculated MCHC value (in the range of 30 to 32 g/dL) is most likely not clinically significant;  however, it should be interpreted with caution in correlation with other red cell parameters and the patient's clinical condition.    [facility]  Date Value Ref Range Status  03/08/2024 28.9 27.0 - 33.0 pg Final   MCV  Date Value Ref Range Status  03/08/2024 86.1 80.0 - 100.0 fL Final  04/28/2022 86 79 - 97 fL Final   No results found for: PLTCOUNTKUC, LABPLAT, POCPLA RDW  Date Value Ref Range Status  03/08/2024 13.4 11.0 - 15.0 % Final  04/28/2022 12.6 11.6 - 15.4 % Final

## 2024-05-26 NOTE — Telephone Encounter (Signed)
 Requested medication (s) are due for refill today:   Yes  Per pt was told to take 2 pills a day by the provider  Requested medication (s) are on the active medication list:   Yes but as 1 pill a day  Future visit scheduled:   No  Seen 2 mo ago   Last ordered: 05/26/2024 #90, 0 refills  Clarification needed on dose   Requested Prescriptions  Pending Prescriptions Disp Refills   metFORMIN  (GLUCOPHAGE -XR) 500 MG 24 hr tablet 90 tablet 0     Endocrinology:  Diabetes - Biguanides Passed - 05/26/2024  3:26 PM      Passed - Cr in normal range and within 360 days    Creat  Date Value Ref Range Status  03/08/2024 0.73 0.70 - 1.35 mg/dL Final   Creatinine, Urine  Date Value Ref Range Status  03/08/2024 165 20 - 320 mg/dL Final         Passed - HBA1C is between 0 and 7.9 and within 180 days    Hgb A1c MFr Bld  Date Value Ref Range Status  03/08/2024 7.8 (H) <5.7 % Final    Comment:    For someone without known diabetes, a hemoglobin A1c value of 6.5% or greater indicates that they may have  diabetes and this should be confirmed with a follow-up  test. . For someone with known diabetes, a value <7% indicates  that their diabetes is well controlled and a value  greater than or equal to 7% indicates suboptimal  control. A1c targets should be individualized based on  duration of diabetes, age, comorbid conditions, and  other considerations. . Currently, no consensus exists regarding use of hemoglobin A1c for diagnosis of diabetes for children. .          Passed - eGFR in normal range and within 360 days    GFR calc Af Amer  Date Value Ref Range Status  11/11/2015 >60 >60 mL/min Final    Comment:    (NOTE) The eGFR has been calculated using the CKD EPI equation. This calculation has not been validated in all clinical situations. eGFR's persistently <60 mL/min signify possible Chronic Kidney Disease.    GFR, Estimated  Date Value Ref Range Status  07/01/2023 56 (L) >60  mL/min Final    Comment:    (NOTE) Calculated using the CKD-EPI Creatinine Equation (2021)    GFR  Date Value Ref Range Status  03/05/2020 110.95 >60.00 mL/min Final   eGFR  Date Value Ref Range Status  03/08/2024 101 > OR = 60 mL/min/1.34m2 Final  11/05/2022 97 >59 mL/min/1.73 Final         Passed - B12 Level in normal range and within 720 days    Vitamin B-12  Date Value Ref Range Status  06/30/2022 850 180 - 914 pg/mL Final    Comment:    (NOTE) This assay is not validated for testing neonatal or myeloproliferative syndrome specimens for Vitamin B12 levels. Performed at [facility], [address], [place], [place] [address]          Passed - Valid encounter within last 6 months    Recent Outpatient Visits           2 months ago Type 2 diabetes mellitus with hyperglycemia, without long-term current use of insulin [facility])   [place], Marsa PARAS, DO   2 months ago Type 2 diabetes mellitus with hyperglycemia, without long-term current use of  insulin [facility])   [place] [facility] [place], [place] W, NP   4 months ago Type 2 diabetes mellitus with hyperglycemia, without long-term current use of insulin [facility])   [place] [facility] [place], [place] W, NP   7 years ago URI with cough and congestion   [facility] [place] Patient, Lauraine CROME, PA-C              Passed - CBC within normal limits and completed in the last 12 months    WBC  Date Value Ref Range Status  03/08/2024 4.2 3.8 - 10.8 Thousand/uL Final   RBC  Date Value Ref Range Status  03/08/2024 5.09 4.20 - 5.80 Million/uL Final   Hemoglobin  Date Value Ref Range Status  03/08/2024 14.7 13.2 - 17.1 g/dL Final  93/87/7976 85.1 13.0 - 17.7 g/dL Final   HCT  Date Value Ref Range Status  03/08/2024 43.8 38.5 - 50.0 % Final   Hematocrit  Date Value Ref Range Status  04/28/2022 43.0 37.5 - 51.0 % Final   MCHC  Date Value  Ref Range Status  03/08/2024 33.6 32.0 - 36.0 g/dL Final    Comment:    For adults, a slight decrease in the calculated MCHC value (in the range of 30 to 32 g/dL) is most likely not clinically significant; however, it should be interpreted with caution in correlation with other red cell parameters and the patient's clinical condition.    [facility]  Date Value Ref Range Status  03/08/2024 28.9 27.0 - 33.0 pg Final   MCV  Date Value Ref Range Status  03/08/2024 86.1 80.0 - 100.0 fL Final  04/28/2022 86 79 - 97 fL Final   No results found for: PLTCOUNTKUC, LABPLAT, POCPLA RDW  Date Value Ref Range Status  03/08/2024 13.4 11.0 - 15.0 % Final  04/28/2022 12.6 11.6 - 15.4 % Final

## 2024-05-31 ENCOUNTER — Other Ambulatory Visit: Payer: Self-pay

## 2024-05-31 NOTE — Patient Instructions (Signed)
 Thank you for allowing the Complex Care Management team to participate in your care. It was great speaking with you!  We will follow up on June 14, 2024 at 1230 Please do not hesitate to contact me if you require assistance prior to our next outreach.    Jackson Acron [facility] Population Health RN Care Manager Direct Dial: [phone]  Fax: [phone] Website: [email or web address]

## 2024-05-31 NOTE — Patient Outreach (Unsigned)
 Complex Care Management   Visit Note  05/31/2024  Name:  Ralph Simpson MRN: [id number] DOB: Dec 25, 1960  Situation: Referral received for Complex Care Management related to {Criteria:32550} I obtained verbal consent from {CHL AMB Patient/Caregiver:[id number]}.  Visit completed with ***  {VISIT LOCATION:32553}  Background:   Past Medical History:  Diagnosis Date   Allergy    Avascular necrosis of bones of both hips (HCC)    GERD (gastroesophageal reflux disease)    Migraine    Neutropenia (HCC)    Osteoarthritis of both hips    Plantar fasciitis    Seizures (HCC)    x 2 03/2021   Trochanteric bursitis of right hip     Assessment: Patient Reported Symptoms:  Cognitive        Neurological      HEENT        Cardiovascular      Respiratory      Endocrine      Gastrointestinal        Genitourinary      Integumentary      Musculoskeletal          Psychosocial              03/08/2024    2:12 PM  Depression screen PHQ 2/9  Decreased Interest 0  Down, Depressed, Hopeless 2  PHQ - 2 Score 2  Altered sleeping 3  Tired, decreased energy 3  Change in appetite 2  Feeling bad or failure about yourself  1  Trouble concentrating 1  Moving slowly or fidgety/restless 0  Suicidal thoughts 0  PHQ-9 Score 12  Difficult doing work/chores Somewhat difficult    There were no vitals filed for this visit.  Medications Reviewed Today     Reviewed by Karoline Lima, RN (Registered Nurse) on 05/31/24 at 1306  Med List Status: <None>   Medication Order Taking? Sig Documenting Provider Last Dose Status Informant  aspirin  81 MG chewable tablet [id number]  Chew 1 tablet (81 mg total) by mouth daily. Judithe Rocky BROCKS, NP  Active   atorvastatin  (LIPITOR) 40 MG tablet [id number]  TAKE 1 TABLET BY MOUTH EVERY DAY Baity, Angeline ORN, NP  Active   Blood Glucose Monitoring Suppl (ONETOUCH VERIO FLEX SYSTEM) w/Device PRESSLEY [id number]  USED TO CHECK BLOOD SUGAR ONCE A DAY. DX E11.9 Antonette Angeline ORN, NP  Active   clonazePAM (KLONOPIN) 1 MG tablet [id number]  Take 1 mg by mouth 2 (two) times daily. [provider]  Active            Med Note HUMBERTO, JHONNIE RAMAN   Fri Jul 31, 2023  9:00 AM) Emily PRN for seizures  glucose blood (RELION GLUCOSE TEST STRIPS) test strip [id number]  1 each by Other route daily. One time daily Antonette Angeline ORN, NP  Active   Lancet Devices (RELION LANCING DEVICE) MISC [id number]  Use to check blood sugar one a day.  DX: E11.9 Antonette Angeline ORN, NP  Active Self  levETIRAcetam  (KEPPRA ) 500 MG tablet [id number]  Take 1 tablet (500 mg total) by mouth 2 (two) times daily.  Patient taking differently: Take 500 mg by mouth 2 (two) times daily. Take 3 tablets by mouth 2 times daily   Onita Duos, MD  Active Self  meclizine  (ANTIVERT ) 25 MG tablet [id number]  TAKE 1 TABLET BY MOUTH 3 TIMES DAILY AS NEEDED FOR DIZZINESS. Edman Marsa PARAS, DO  Active   metFORMIN  (GLUCOPHAGE -XR) 500 MG 24 hr tablet [id number] Yes TAKE 1 TABLET  BY MOUTH EVERY DAY WITH BREAKFAST Antonette Angeline ORN, NP  Active   midodrine  (PROAMATINE ) 5 MG tablet [id number]  TAKE 1 TABLET (5 MG TOTAL) BY MOUTH 2 (TWO) TIMES DAILY WITH A MEAL. Antonette Angeline ORN, NP  Active   Rimegepant Sulfate (NURTEC) 75 MG TBDP [id number]  TAKE 1 TAB AT ONSET OF MIGRAINE. MAY REPEAT IN 2 HRS, IF NEEDED. MAX DOSE: 2 TABS/DAY. Antonette Angeline ORN, NP  Active             Recommendation:   {RECOMMENDATONS:32554}  Follow Up Plan:   {FOLLOWUP:32559}  SIG ***

## 2024-06-06 NOTE — Progress Notes (Signed)
 Carelink Summary Report / Loop Recorder

## 2024-06-07 ENCOUNTER — Telehealth: Payer: Self-pay

## 2024-06-07 ENCOUNTER — Other Ambulatory Visit: Payer: Self-pay | Admitting: Internal Medicine

## 2024-06-07 DIAGNOSIS — E1165 Type 2 diabetes mellitus with hyperglycemia: Secondary | ICD-10-CM

## 2024-06-07 NOTE — Telephone Encounter (Signed)
 Copied from CRM #[id number]. Topic: Clinical - Medication Question >> Jun 07, 2024  4:21 PM Geneva B wrote: Reason for RMF:ejupzwu is calling in because he  takes  metFORMIN  (GLUCOPHAGE -XR) 500 MG 24 hr tablet and pt was told to take 2 a day and since he take 2 a day he is out faster because he takes double the amount and he went to pharmacy and they where giving metformin  of 1000 mg is the one the pharmacy is trying to give him patient needs metFORMIN  (GLUCOPHAGE -XR) 500 extended MG 24 hr tablet please call pt back [phone]

## 2024-06-08 ENCOUNTER — Telehealth: Payer: Self-pay

## 2024-06-08 MED ORDER — METFORMIN HCL ER 500 MG PO TB24: 1000.0000 mg | ORAL_TABLET | Freq: Two times a day (BID) | ORAL | 0 refills | Status: DC

## 2024-06-08 NOTE — Telephone Encounter (Signed)
 Metformin  XR 500 mg, change instructions to 2 tabs twice daily.  Rx sent to pharmacy

## 2024-06-08 NOTE — Telephone Encounter (Signed)
 Patient notified corrected instructions and prescription sent to pharmacy. Also notified of instructions on how to take Metformin 

## 2024-06-08 NOTE — Telephone Encounter (Signed)
 Copied from CRM [id number]. Topic: General - Other >> Jun 08, 2024  1:16 PM [place] B wrote: Reason for CRM: pt called in says received [facility] about getting a tettanus shot and another shot he couldn't recall the name of, He wants to know if he should get thise. Please cb

## 2024-06-08 NOTE — Addendum Note (Signed)
 Addended by: ANTONETTE ANGELINE ORN on: 06/08/2024 08:11 AM   Modules accepted: Orders

## 2024-06-09 ENCOUNTER — Telehealth: Payer: Self-pay

## 2024-06-09 NOTE — Telephone Encounter (Signed)
 Discontinued Change in therapy Ralph Marsa PARAS, DO 03/18/24 1534   Requested Prescriptions  Refused Prescriptions Disp Refills   metFORMIN  (GLUCOPHAGE ) 1000 MG tablet [Pharmacy Med Name: METFORMIN  HCL 1,000 MG TABLET] 170 tablet 0    Sig: TAKE 0.5 TABLETS (500 MG TOTAL) BY MOUTH 2 (TWO) TIMES DAILY WITH A MEAL FOR 10 DAYS, THEN 1 TABLET (1,000 MG TOTAL) 2 (TWO) TIMES DAILY WITH A MEAL.     Endocrinology:  Diabetes - Biguanides Passed - 06/09/2024  1:16 PM      Passed - Cr in normal range and within 360 days    Creat  Date Value Ref Range Status  03/08/2024 0.73 0.70 - 1.35 mg/dL Final   Creatinine, Urine  Date Value Ref Range Status  03/08/2024 165 20 - 320 mg/dL Final         Passed - HBA1C is between 0 and 7.9 and within 180 days    Hgb A1c MFr Bld  Date Value Ref Range Status  03/08/2024 7.8 (H) <5.7 % Final    Comment:    For someone without known diabetes, a hemoglobin A1c value of 6.5% or greater indicates that they may have  diabetes and this should be confirmed with a follow-up  test. . For someone with known diabetes, a value <7% indicates  that their diabetes is well controlled and a value  greater than or equal to 7% indicates suboptimal  control. A1c targets should be individualized based on  duration of diabetes, age, comorbid conditions, and  other considerations. . Currently, no consensus exists regarding use of hemoglobin A1c for diagnosis of diabetes for children. .          Passed - eGFR in normal range and within 360 days    GFR calc Af Amer  Date Value Ref Range Status  11/11/2015 >60 >60 mL/min Final    Comment:    (NOTE) The eGFR has been calculated using the CKD EPI equation. This calculation has not been validated in all clinical situations. eGFR's persistently <60 mL/min signify possible Chronic Kidney Disease.    GFR, Estimated  Date Value Ref Range Status  07/01/2023 56 (L) >60 mL/min Final    Comment:    (NOTE) Calculated  using the CKD-EPI Creatinine Equation (2021)    GFR  Date Value Ref Range Status  03/05/2020 110.95 >60.00 mL/min Final   eGFR  Date Value Ref Range Status  03/08/2024 101 > OR = 60 mL/min/1.63m2 Final  11/05/2022 97 >59 mL/min/1.73 Final         Passed - B12 Level in normal range and within 720 days    Vitamin B-12  Date Value Ref Range Status  06/30/2022 850 180 - 914 pg/mL Final    Comment:    (NOTE) This assay is not validated for testing neonatal or myeloproliferative syndrome specimens for Vitamin B12 levels. Performed at [facility], [address] [place], [place] [address]          Passed - Valid encounter within last 6 months    Recent Outpatient Visits           2 months ago Type 2 diabetes mellitus with hyperglycemia, without long-term current use of insulin [facility])   [place] [facility] [place], Marsa PARAS, DO   3 months ago Type 2 diabetes mellitus with hyperglycemia, without long-term current use of insulin [facility])   [place] [facility] [place], Angeline ORN, NP   4 months  ago Type 2 diabetes mellitus with hyperglycemia, without long-term current use of insulin [facility])    [place] [facility] [place], [place] W, NP   7 years ago URI with cough and congestion   [facility] [place] Patient, Lauraine CROME, PA-C              Passed - CBC within normal limits and completed in the last 12 months    WBC  Date Value Ref Range Status  03/08/2024 4.2 3.8 - 10.8 Thousand/uL Final   RBC  Date Value Ref Range Status  03/08/2024 5.09 4.20 - 5.80 Million/uL Final   Hemoglobin  Date Value Ref Range Status  03/08/2024 14.7 13.2 - 17.1 g/dL Final  93/87/7976 85.1 13.0 - 17.7 g/dL Final   HCT  Date Value Ref Range Status  03/08/2024 43.8 38.5 - 50.0 % Final   Hematocrit  Date Value Ref Range Status  04/28/2022 43.0 37.5 - 51.0 % Final   MCHC  Date Value Ref Range Status  03/08/2024 33.6 32.0 - 36.0 g/dL  Final    Comment:    For adults, a slight decrease in the calculated MCHC value (in the range of 30 to 32 g/dL) is most likely not clinically significant; however, it should be interpreted with caution in correlation with other red cell parameters and the patient's clinical condition.    [facility]  Date Value Ref Range Status  03/08/2024 28.9 27.0 - 33.0 pg Final   MCV  Date Value Ref Range Status  03/08/2024 86.1 80.0 - 100.0 fL Final  04/28/2022 86 79 - 97 fL Final   No results found for: PLTCOUNTKUC, LABPLAT, POCPLA RDW  Date Value Ref Range Status  03/08/2024 13.4 11.0 - 15.0 % Final  04/28/2022 12.6 11.6 - 15.4 % Final

## 2024-06-09 NOTE — Telephone Encounter (Signed)
 Copied from CRM [id number]. Topic: Clinical - Prescription Issue >> Jun 09, 2024  2:24 PM Tobias L wrote: Reason for CRM: [facility] stating for [facility] to get in contact with provider for the metFORMIN  (GLUCOPHAGE -XR) 500 MG 24 hr tablet.  Pharmacy informed him a couple of weeks ago that they would not be able to fill the prescription until 07/08/24 due to insurance.   Patient close to running out, only has two pills. Patient requesting assistance with getting prescription.   Please assist patient further, [phone]

## 2024-06-14 ENCOUNTER — Other Ambulatory Visit: Payer: Self-pay

## 2024-06-14 NOTE — Patient Outreach (Unsigned)
 Complex Care Management   Visit Note  06/14/2024  Name:  Ralph Simpson MRN: [id number] DOB: [birth year]  Situation: Referral received for Complex Care Management related to {Criteria:32550} I obtained verbal consent from {CHL AMB Patient/Caregiver:[id number]}.  Visit completed with ***  {VISIT LOCATION:32553}  Background:   Past Medical History:  Diagnosis Date   Allergy    Avascular necrosis of bones of both hips (HCC)    GERD (gastroesophageal reflux disease)    Migraine    Neutropenia (HCC)    Osteoarthritis of both hips    Plantar fasciitis    Seizures (HCC)    x 2 03/2021   Trochanteric bursitis of right hip     Assessment: Patient Reported Symptoms:  Cognitive        Neurological      HEENT        Cardiovascular      Respiratory      Endocrine      Gastrointestinal        Genitourinary      Integumentary      Musculoskeletal          Psychosocial              05/31/2024    1:58 PM  Depression screen PHQ 2/9  Decreased Interest 0  Down, Depressed, Hopeless 0  PHQ - 2 Score 0    There were no vitals filed for this visit.  Medications Reviewed Today     Reviewed by Karoline Lima, RN (Registered Nurse) on 06/14/24 at 1259  Med List Status: <None>   Medication Order Taking? Sig Documenting Provider Last Dose Status Informant  aspirin  81 MG chewable tablet [id number]  Chew 1 tablet (81 mg total) by mouth daily.  Patient not taking: Reported on 05/31/2024   Judithe Rocky BROCKS, NP  Active   atorvastatin  (LIPITOR) 40 MG tablet [id number]  TAKE 1 TABLET BY MOUTH EVERY DAY Antonette Angeline ORN, NP  Active   Blood Glucose Monitoring Suppl (ONETOUCH VERIO FLEX SYSTEM) w/Device KIT [id number] No USED TO CHECK BLOOD SUGAR ONCE A DAY. DX E11.9 Antonette Angeline ORN, NP Taking Active   clonazePAM (KLONOPIN) 1 MG tablet [id number]  Take 1 mg by mouth 2 (two) times daily. [provider]  Active            Med Note HUMBERTO, JHONNIE RAMAN   Fri Jul 31, 2023  9:00 AM) Emily  PRN for seizures  glucose blood (RELION GLUCOSE TEST STRIPS) test strip [id number] No 1 each by Other route daily. One time daily Antonette Angeline ORN, NP Taking Active   Lancet Devices (RELION LANCING DEVICE) MISC [id number] No Use to check blood sugar one a day.  DX: E11.9 Antonette Angeline ORN, NP Taking Active Self  levETIRAcetam  (KEPPRA ) 500 MG tablet [id number]  Take 1 tablet (500 mg total) by mouth 2 (two) times daily.  Patient taking differently: Take 1 tablet (500 mg total) by mouth 2 (two) times daily.   Onita Duos, MD  Active Self  meclizine  (ANTIVERT ) 25 MG tablet [id number]  TAKE 1 TABLET BY MOUTH 3 TIMES DAILY AS NEEDED FOR DIZZINESS.  Patient taking differently: TAKE 1 TABLET BY MOUTH 3 TIMES DAILY AS NEEDED FOR DIZZINESS.   Karamalegos, Marsa PARAS, DO  Active   metFORMIN  (GLUCOPHAGE -XR) 500 MG 24 hr tablet [id number]  Take 2 tablets (1,000 mg total) by mouth 2 (two) times daily with a meal. Baity, Angeline ORN, NP  Active   midodrine  (PROAMATINE ) 5 MG tablet  [id number]  TAKE 1 TABLET (5 MG TOTAL) BY MOUTH 2 (TWO) TIMES DAILY WITH A MEAL. Antonette Angeline ORN, NP  Active   Rimegepant Sulfate (NURTEC) 75 MG TBDP [id number]  TAKE 1 TAB AT ONSET OF MIGRAINE. MAY REPEAT IN 2 HRS, IF NEEDED. MAX DOSE: 2 TABS/DAY. Antonette Angeline ORN, NP  Active             Recommendation:   {RECOMMENDATONS:32554}  Follow Up Plan:   {FOLLOWUP:32559}  SIG ***

## 2024-06-14 NOTE — Patient Instructions (Signed)
Thank you for allowing the Chronic Care Management team to participate in your care.  

## 2024-06-20 ENCOUNTER — Ambulatory Visit (INDEPENDENT_AMBULATORY_CARE_PROVIDER_SITE_OTHER)

## 2024-06-20 DIAGNOSIS — I639 Cerebral infarction, unspecified: Secondary | ICD-10-CM

## 2024-06-20 LAB — CUP PACEART REMOTE DEVICE CHECK: Date Time Interrogation Session: 20250802232426

## 2024-06-21 ENCOUNTER — Ambulatory Visit: Payer: Self-pay | Admitting: Cardiovascular Disease

## 2024-07-01 ENCOUNTER — Telehealth: Payer: Self-pay

## 2024-07-01 NOTE — Patient Instructions (Signed)
 [place] KATHEE Savers - I am sorry I was unable to reach you today for our scheduled appointment. I work with Antonette Angeline ORN, NP and am calling to support your healthcare needs. Please contact me at [phone] at your earliest convenience. I look forward to speaking with you soon.   Thank you,   Jackson Acron [facility] RN Care Manager Direct Dial: [phone]  Fax: [phone] Website: [email or web address]

## 2024-07-20 ENCOUNTER — Other Ambulatory Visit: Payer: Self-pay | Admitting: Family Medicine

## 2024-07-20 DIAGNOSIS — R42 Dizziness and giddiness: Secondary | ICD-10-CM

## 2024-07-20 NOTE — Telephone Encounter (Signed)
 Requested medication (s) are due for refill today: yes  Requested medication (s) are on the active medication list: yes  Last refill:  05/13/24  Future visit scheduled: yes  Notes to clinic:  Unable to refill per protocol, cannot delegate.      Requested Prescriptions  Pending Prescriptions Disp Refills   meclizine  (ANTIVERT ) 25 MG tablet [Pharmacy Med Name: MECLIZINE  25 MG TABLET] 90 tablet 1    Sig: TAKE 1 TABLET BY MOUTH 3 TIMES DAILY AS NEEDED FOR DIZZINESS.     Not Delegated - Gastroenterology: Antiemetics Failed - 07/20/2024  2:17 PM      Failed - This refill cannot be delegated      Passed - Valid encounter within last 6 months    Recent Outpatient Visits           4 months ago Type 2 diabetes mellitus with hyperglycemia, without long-term current use of insulin [facility])   [place] [facility] [place], Marsa PARAS, DO   4 months ago Type 2 diabetes mellitus with hyperglycemia, without long-term current use of insulin [facility])   [place] [facility] [place], [place] W, NP   5 months ago Type 2 diabetes mellitus with hyperglycemia, without long-term current use of insulin [facility])   [place] [facility] [place], [place] W, NP   7 years ago URI with cough and congestion   [facility] [place] Patient, Lauraine CROME, PA-C

## 2024-07-21 ENCOUNTER — Ambulatory Visit

## 2024-07-21 DIAGNOSIS — I639 Cerebral infarction, unspecified: Secondary | ICD-10-CM

## 2024-07-21 LAB — CUP PACEART REMOTE DEVICE CHECK: Date Time Interrogation Session: 20250903233037

## 2024-07-22 ENCOUNTER — Ambulatory Visit: Payer: Self-pay | Admitting: Cardiovascular Disease

## 2024-07-30 NOTE — Progress Notes (Signed)
 Remote Loop Recorder Transmission

## 2024-08-05 ENCOUNTER — Ambulatory Visit: Payer: Medicare Other

## 2024-08-05 ENCOUNTER — Ambulatory Visit: Payer: Self-pay

## 2024-08-05 VITALS — Ht 75.0 in | Wt 193.0 lb

## 2024-08-05 DIAGNOSIS — Z0000 Encounter for general adult medical examination without abnormal findings: Secondary | ICD-10-CM | POA: Diagnosis not present

## 2024-08-05 NOTE — Progress Notes (Signed)
 Subjective:   Ralph Simpson is a 63 y.o. who presents for a Medicare Wellness preventive visit.  As a reminder, Annual Wellness Visits don't include a physical exam, and some assessments may be limited, especially if this visit is performed virtually. We may recommend an in-person follow-up visit with your provider if needed.  Visit Complete: Virtual I connected with  Ralph Simpson on 08/05/24 by a audio enabled telemedicine application and verified that I am speaking with the correct person using two identifiers.  Patient Location: Home  Provider Location: Office/Clinic  I discussed the limitations of evaluation and management by telemedicine. The patient expressed understanding and agreed to proceed.  Vital Signs: Because this visit was a virtual/telehealth visit, some criteria may be missing or patient reported. Any vitals not documented were not able to be obtained and vitals that have been documented are patient reported.  VideoDeclined- This patient declined Librarian, academic. Therefore the visit was completed with audio only.  Persons Participating in Visit: Patient.  AWV Questionnaire: No: Patient Medicare AWV questionnaire was not completed prior to this visit.  Cardiac Risk Factors include: advanced age (>22men, >33 women);diabetes mellitus;dyslipidemia;male gender     Objective:    Today's Vitals   08/05/24 0810  Weight: 193 lb (87.5 kg)  Height: 6' 3 (1.905 m)  PainSc: 10-Worst pain ever  PainLoc: Back   Body mass index is 24.12 kg/m.     08/05/2024    8:10 AM 05/31/2024    1:22 PM 07/31/2023    9:05 AM 07/01/2023    2:48 PM 02/10/2023    4:15 PM 11/15/2022   12:58 PM 07/25/2022    8:57 AM  Advanced Directives  Does Patient Have a Medical Advance Directive? No No No No No No No  Would patient like information on creating a medical advance directive? No - Patient declined No - Patient declined No - Patient declined No - Patient  declined No - Patient declined No - Patient declined No - Patient declined    Current Medications (verified) Outpatient Encounter Medications as of 08/05/2024  Medication Sig   aspirin  81 MG chewable tablet Chew 1 tablet (81 mg total) by mouth daily.   atorvastatin  (LIPITOR) 40 MG tablet TAKE 1 TABLET BY MOUTH EVERY DAY   Blood Glucose Monitoring Suppl (ONETOUCH VERIO FLEX SYSTEM) w/Device KIT USED TO CHECK BLOOD SUGAR ONCE A DAY. DX E11.9   clonazePAM (KLONOPIN) 1 MG tablet Take 1 mg by mouth 2 (two) times daily.   glucose blood (RELION GLUCOSE TEST STRIPS) test strip 1 each by Other route daily. One time daily   Lancet Devices (RELION LANCING DEVICE) MISC Use to check blood sugar one a day.  DX: E11.9   levETIRAcetam  (KEPPRA ) 500 MG tablet Take 1 tablet (500 mg total) by mouth 2 (two) times daily. (Patient taking differently: Take 1 tablet (500 mg total) by mouth 2 (two) times daily.)   meclizine  (ANTIVERT ) 25 MG tablet TAKE 1 TABLET BY MOUTH 3 TIMES DAILY AS NEEDED FOR DIZZINESS.   metFORMIN  (GLUCOPHAGE -XR) 500 MG 24 hr tablet Take 2 tablets (1,000 mg total) by mouth 2 (two) times daily with a meal.   midodrine  (PROAMATINE ) 5 MG tablet TAKE 1 TABLET (5 MG TOTAL) BY MOUTH 2 (TWO) TIMES DAILY WITH A MEAL.   Rimegepant Sulfate (NURTEC) 75 MG TBDP TAKE 1 TAB AT ONSET OF MIGRAINE. MAY REPEAT IN 2 HRS, IF NEEDED. MAX DOSE: 2 TABS/DAY.   No facility-administered encounter medications  on file as of 08/05/2024.    Allergies (verified) Aspirin    History: Past Medical History:  Diagnosis Date   Allergy    Avascular necrosis of bones of both hips (HCC)    GERD (gastroesophageal reflux disease)    Migraine    Neutropenia (HCC)    Osteoarthritis of both hips    Plantar fasciitis    Seizures (HCC)    x 2 03/2021   Trochanteric bursitis of right hip    Past Surgical History:  Procedure Laterality Date   HERNIA REPAIR     HIP SURGERY     IR ANGIO INTRA EXTRACRAN SEL COM CAROTID INNOMINATE  BILAT MOD SED  04/08/2021   IR ANGIO VERTEBRAL SEL SUBCLAVIAN INNOMINATE BILAT MOD SED  04/08/2021   IR ANGIO VERTEBRAL SEL VERTEBRAL UNI R MOD SED  02/10/2023   IR CT HEAD LTD  03/30/2023   IR PERCUTANEOUS ART THROMBECTOMY/INFUSION INTRACRANIAL INC DIAG ANGIO  02/10/2023   IR US  GUIDE VASC ACCESS RIGHT  04/08/2021   RADIOLOGY WITH ANESTHESIA N/A 02/10/2023   Procedure: IR WITH ANESTHESIA;  Surgeon: Radiologist, Medication, MD;  Location: [facility] OR;  Service: Radiology;  Laterality: N/A;   Family History  Problem Relation Age of Onset   Arthritis Mother    Stroke Mother    Diabetes Father    Heart disease Father    Hyperlipidemia Father    Hypertension Father    Kidney disease Maternal Grandfather    Colon cancer Neg Hx    Stomach cancer Neg Hx    Rectal cancer Neg Hx    Social History   Socioeconomic History   Marital status: Single    Spouse name: Not on file   Number of children: 0   Years of education: Not on file   Highest education level: 10th grade  Occupational History    Comment: NA  Tobacco Use   Smoking status: Former    Current packs/day: 0.00    Types: Cigarettes    Quit date: 11/17/1978    Years since quitting: 45.7    Passive exposure: Never   Smokeless tobacco: Former    Types: Chew   Tobacco comments:    quit 10-15 years ago  Vaping Use   Vaping status: Never Used  Substance and Sexual Activity   Alcohol use: Not Currently   Drug use: No   Sexual activity: Yes  Other Topics Concern   Not on file  Social History Narrative   Not on file   Social Drivers of Health   Financial Resource Strain: Low Risk  (08/05/2024)   Overall Financial Resource Strain (CARDIA)    Difficulty of Paying Living Expenses: Not hard at all  Food Insecurity: No Food Insecurity (08/05/2024)   Hunger Vital Sign    Worried About Running Out of Food in the Last Year: Never true    Ran Out of Food in the Last Year: Never true  Transportation Needs: No Transportation Needs (08/05/2024)    PRAPARE - Administrator, Civil Service (Medical): No    Lack of Transportation (Non-Medical): No  Physical Activity: Insufficiently Active (08/05/2024)   Exercise Vital Sign    Days of Exercise per Week: 5 days    Minutes of Exercise per Session: 20 min  Stress: No Stress Concern Present (08/05/2024)   [employer] of Occupational Health - Occupational Stress Questionnaire    Feeling of Stress: Not at all  Social Connections: Socially Isolated (08/05/2024)   Social Connection and  Isolation Panel    Frequency of Communication with Friends and Family: More than three times a week    Frequency of Social Gatherings with Friends and Family: Once a week    Attends Religious Services: Never    Database administrator or Organizations: No    Attends Engineer, structural: Never    Marital Status: Never married    Tobacco Counseling Counseling given: No Tobacco comments: quit 10-15 years ago    Clinical Intake:  Pre-visit preparation completed: Yes  Pain : No/denies pain Pain Score: 10-Worst pain ever     BMI - recorded: 24.12 Nutritional Status: BMI of 19-24  Normal Nutritional Risks: None Diabetes: Yes CBG done?: Yes CBG resulted in Enter/ Edit results?: Yes (on 08/04/24 - 180 (fasting)) Did pt. bring in CBG monitor from home?: No  Lab Results  Component Value Date   HGBA1C 7.8 (H) 03/08/2024   HGBA1C 7.9 (A) 12/08/2023   HGBA1C 8.2 (H) 09/07/2023     How often do you need to have someone help you when you read instructions, pamphlets, or other written materials from your doctor or pharmacy?: 1 - Never  Interpreter Needed?: No  Information entered by :: Verdie Saba, CMA   Activities of Daily Living     08/05/2024    8:14 AM  In your present state of health, do you have any difficulty performing the following activities:  Hearing? 0  Vision? 0  Difficulty concentrating or making decisions? 0  Walking or climbing stairs? 0  Dressing or  bathing? 0  Doing errands, shopping? 0  Preparing Food and eating ? N  Using the Toilet? N  In the past six months, have you accidently leaked urine? N  Do you have problems with loss of bowel control? N  Managing your Medications? N  Managing your Finances? N  Housekeeping or managing your Housekeeping? N    Patient Care Team: Antonette Angeline ORN, NP as PCP - General (Internal Medicine) Babara Call, MD as Consulting Physician (Hematology and Oncology) Karoline Lima, RN as Case Manager Octavia Bruckner, MD as Consulting Physician (Ophthalmology)  I have updated your Care Teams any recent Medical Services you may have received from other providers in the past year.     Assessment:   This is a routine wellness examination for Ralph Simpson.  Hearing/Vision screen Hearing Screening - Comments:: Denies hearing difficulties   Vision Screening - Comments:: Wears eyeglasses for reading only - up to date with routine eye exams with [facility]   Goals Addressed               This Visit's Progress     Patient Stated (pt-stated)        Patient stated he plans to continue living and manage back pain       Depression Screen     08/05/2024    8:17 AM 06/14/2024   12:41 PM 05/31/2024    1:58 PM 05/31/2024    1:47 PM 03/08/2024    2:12 PM 01/25/2024   12:53 PM 07/31/2023    9:03 AM  PHQ 2/9 Scores  PHQ - 2 Score 0 0 0 0 2 4 1   PHQ- 9 Score 0    12 11     Fall Risk     08/05/2024    8:15 AM 05/31/2024    1:56 PM 03/08/2024    2:09 PM 01/25/2024   12:53 PM 07/31/2023    9:06 AM  Fall Risk  Falls in the past year? 0 0 0 1 0  Number falls in past yr: 0 0  1 0  Injury with Fall? 0 0  0 0  Risk for fall due to : No Fall Risks Impaired balance/gait;Medication side effect;Orthopedic patient;Other (Comment)   No Fall Risks  Risk for fall due to: Comment  History of Stroke     Follow up Falls evaluation completed;Falls prevention discussed Falls prevention discussed   Falls prevention  discussed;Falls evaluation completed    MEDICARE RISK AT HOME:  Medicare Risk at Home Any stairs in or around the home?: Yes If so, are there any without handrails?: No Home free of loose throw rugs in walkways, pet beds, electrical cords, etc?: Yes Adequate lighting in your home to reduce risk of falls?: Yes Life alert?: No Use of a cane, walker or w/c?: Yes (cane) Grab bars in the bathroom?: No Shower chair or bench in shower?: No Elevated toilet seat or a handicapped toilet?: No  TIMED UP AND GO:  Was the test performed?  No  Cognitive Function: 6CIT completed      04/28/2022    3:00 PM  Montreal Cognitive Assessment   Visuospatial/ Executive (0/5) 4  Naming (0/3) 3  Attention: Read list of digits (0/2) 2  Attention: Read list of letters (0/1) 1  Attention: Serial 7 subtraction starting at 100 (0/3) 3  Language: Repeat phrase (0/2) 2  Language : Fluency (0/1) 0  Abstraction (0/2) 1  Delayed Recall (0/5) 1  Orientation (0/6) 6  Total 23      08/05/2024    8:20 AM 07/31/2023    9:08 AM  6CIT Screen  What Year? 0 points 0 points  What month? 0 points 0 points  What time? 0 points 0 points  Count back from 20 0 points 0 points  Months in reverse 4 points 4 points  Repeat phrase 0 points 4 points  Total Score 4 points 8 points    Immunizations Immunization History  Administered Date(s) Administered   Tdap 11/13/2021   Zoster Recombinant(Shingrix) 11/13/2021, 02/13/2022    Screening Tests Health Maintenance  Topic Date Due   OPHTHALMOLOGY EXAM  01/02/2023   Influenza Vaccine  Never done   Pneumococcal Vaccine: 50+ Years (1 of 2 - PCV) 03/08/2025 (Originally 01/28/1980)   FOOT EXAM  09/06/2024   HEMOGLOBIN A1C  09/07/2024   Diabetic kidney evaluation - eGFR measurement  03/08/2025   Diabetic kidney evaluation - Urine ACR  03/08/2025   Medicare Annual Wellness (AWV)  08/05/2025   Colonoscopy  07/29/2028   DTaP/Tdap/Td (2 - Td or Tdap) 11/14/2031    Hepatitis C Screening  Completed   HIV Screening  Completed   Zoster Vaccines- Shingrix  Completed   Hepatitis B Vaccines 19-59 Average Risk  Aged Out   HPV VACCINES  Aged Out   Meningococcal B Vaccine  Aged Out   COVID-19 Vaccine  Discontinued    Health Maintenance Items Addressed: 08/05/2024  Additional Screening:  Vision Screening: Recommended annual ophthalmology exams for early detection of glaucoma and other disorders of the eye. Is the patient up to date with their annual eye exam?  No  Who is the provider or what is the name of the office in which the patient attends annual eye exams? Dr Medford Gaudy of [facility]  Dental Screening: Recommended annual dental exams for proper oral hygiene  Community Resource Referral / Chronic Care Management: CRR required this visit?  No  CCM required this visit?  No   Plan:    I have personally reviewed and noted the following in the patient's chart:   Medical and social history Use of alcohol, tobacco or illicit drugs  Current medications and supplements including opioid prescriptions. Patient is not currently taking opioid prescriptions. Functional ability and status Nutritional status Physical activity Advanced directives List of other physicians Hospitalizations, surgeries, and ER visits in previous 12 months Vitals Screenings to include cognitive, depression, and falls Referrals and appointments  In addition, I have reviewed and discussed with patient certain preventive protocols, quality metrics, and best practice recommendations. A written personalized care plan for preventive services as well as general preventive health recommendations were provided to patient.   Verdie CHRISTELLA Saba, CMA   08/05/2024   After Visit Summary: (MyChart) Due to this being a telephonic visit, the after visit summary with patients personalized plan was offered to patient via MyChart   Notes: Will send a msg to PCP's nurse to contact  the pt regarding an appt (& triage) regarding back pain.

## 2024-08-05 NOTE — Patient Instructions (Addendum)
 Mr. Rathert,  Thank you for taking the time for your Medicare Wellness Visit. I appreciate your continued commitment to your health goals. Please review the care plan we discussed, and feel free to reach out if I can assist you further.  Medicare recommends these wellness visits once per year to help you and your care team stay ahead of potential health issues. These visits are designed to focus on prevention, allowing your provider to concentrate on managing your acute and chronic conditions during your regular appointments.  Please note that Annual Wellness Visits do not include a physical exam. Some assessments may be limited, especially if the visit was conducted virtually. If needed, we may recommend a separate in-person follow-up with your provider.  Ongoing Care Seeing your primary care provider every 3 to 6 months helps us  monitor your health and provide consistent, personalized care.   Referrals If a referral was made during today's visit and you haven't received any updates within two weeks, please contact the referred provider directly to check on the status.  Recommended Screenings:  Health Maintenance  Topic Date Due   Eye exam for diabetics  01/02/2023   Flu Shot  Never done   Pneumococcal Vaccine for age over 32 (1 of 2 - PCV) 03/08/2025*   Complete foot exam   09/06/2024   Hemoglobin A1C  09/07/2024   Yearly kidney function blood test for diabetes  03/08/2025   Yearly kidney health urinalysis for diabetes  03/08/2025   Medicare Annual Wellness Visit  08/05/2025   Colon Cancer Screening  07/29/2028   DTaP/Tdap/Td vaccine (2 - Td or Tdap) 11/14/2031   Hepatitis C Screening  Completed   HIV Screening  Completed   Zoster (Shingles) Vaccine  Completed   Hepatitis B Vaccine  Aged Out   HPV Vaccine  Aged Out   Meningitis B Vaccine  Aged Out   COVID-19 Vaccine  Discontinued  *Topic was postponed. The date shown is not the original due date.       08/05/2024    8:10 AM   Advanced Directives  Does Patient Have a Medical Advance Directive? No  Would patient like information on creating a medical advance directive? No - Patient declined   Advance Care Planning is important because it: Ensures you receive medical care that aligns with your values, goals, and preferences. Provides guidance to your family and loved ones, reducing the emotional burden of decision-making during critical moments.  Vision: Annual vision screenings are recommended for early detection of glaucoma, cataracts, and diabetic retinopathy. These exams can also reveal signs of chronic conditions such as diabetes and high blood pressure.  Dental: Annual dental screenings help detect early signs of oral cancer, gum disease, and other conditions linked to overall health, including heart disease and diabetes.  Please see the attached documents for additional preventive care recommendations.

## 2024-08-05 NOTE — Telephone Encounter (Signed)
  FYI Only or Action Required?: Action required by provider: request for appointment and update on patient condition. Will go to UC if no appointment available   Patient was last seen in primary care on 03/18/2024 by Edman Marsa PARAS, DO.  Called Nurse Triage reporting Back Pain.  Symptoms began yesterday.  Interventions attempted: Ice/heat application.  Symptoms are: unchanged.  Triage Disposition: See HCP Within 4 Hours (Or PCP Triage)  Patient/caregiver understands and will follow disposition?: Yes  Copied from CRM [id number]. Topic: Clinical - Red Word Triage >> Aug 05, 2024  1:31 PM Emylou G wrote: [facility] that prompted transfer to Nurse Triage: patients back went out yesterday.. can barely move Reason for Disposition  [1] SEVERE back pain (e.g., excruciating, unable to do any normal activities) AND [2] not improved 2 hours after pain medicine  Answer Assessment - Initial Assessment Questions 1. ONSET: When did the pain begin? (e.g., minutes, hours, days)     One day ago 2. LOCATION: Where does it hurt? (upper, mid or lower back)     Belt line on both sides of back 3. SEVERITY: How bad is the pain?  (e.g., Scale 1-10; mild, moderate, or severe)     Feels like it is giving way when he tries to stand, feels like a strain 4. PATTERN: Is the pain constant? (e.g., yes, no; constant, intermittent)      Paint with standing and walking 5. RADIATION: Does the pain shoot into your legs or somewhere else?     denies 6. CAUSE:  What do you think is causing the back pain?      States that he was pouring water out of a five gallon bucket and turned wrong  8. MEDICINES: What have you taken so far for the pain? (e.g., nothing, acetaminophen , NSAIDS)     Has not taken anything for fear that it would interact with is scheduled meds 9. NEUROLOGIC SYMPTOMS: Do you have any weakness, numbness, or problems with bowel/bladder control?     N/a  11. PREGNANCY: Is there any  chance you are pregnant? When was your last menstrual period?       N/a  Protocols used: Back Pain-A-AH

## 2024-08-05 NOTE — Telephone Encounter (Signed)
 Patient advised we did not have any appointments available today. Advised urgent care/ED this weekend. Otherwise, call back next week if anything further needed. Patient verbalized understanding.

## 2024-08-11 ENCOUNTER — Other Ambulatory Visit: Payer: Self-pay | Admitting: Internal Medicine

## 2024-08-12 NOTE — Telephone Encounter (Signed)
 Requested Prescriptions  Pending Prescriptions Disp Refills   atorvastatin  (LIPITOR) 40 MG tablet [Pharmacy Med Name: ATORVASTATIN  40 MG TABLET] 90 tablet 0    Sig: TAKE 1 TABLET BY MOUTH EVERY DAY     Cardiovascular:  Antilipid - Statins Failed - 08/12/2024 11:17 AM      Failed - Lipid Panel in normal range within the last 12 months    Cholesterol  Date Value Ref Range Status  03/08/2024 134 <200 mg/dL Final   LDL Cholesterol (Calc)  Date Value Ref Range Status  03/08/2024 77 mg/dL (calc) Final    Comment:    Reference range: <100 . Desirable range <100 mg/dL for primary prevention;   <70 mg/dL for patients with CHD or diabetic patients  with > or = 2 CHD risk factors. SABRA LDL-C is now calculated using the Martin-Hopkins  calculation, which is a validated novel method providing  better accuracy than the Friedewald equation in the  estimation of LDL-C.  Gladis APPLETHWAITE et al. SANDREA. 7986;689(80): 2061-2068  (http://education.[facility].com/faq/FAQ164)    HDL  Date Value Ref Range Status  03/08/2024 38 (L) > OR = 40 mg/dL Final   Triglycerides  Date Value Ref Range Status  03/08/2024 109 <150 mg/dL Final         Passed - Patient is not pregnant      Passed - Valid encounter within last 12 months    Recent Outpatient Visits           4 months ago Type 2 diabetes mellitus with hyperglycemia, without long-term current use of insulin [facility])   [facility] Mark, Marsa PARAS, DO   5 months ago Type 2 diabetes mellitus with hyperglycemia, without long-term current use of insulin [facility])   [facility], [place] W, NP   6 months ago Type 2 diabetes mellitus with hyperglycemia, without long-term current use of insulin [facility])   [facility] [place], [place] W, NP   7 years ago URI with cough and congestion   [facility] [place] Patient, Lauraine CROME, PA-C

## 2024-08-15 NOTE — Progress Notes (Signed)
 Remote Loop Recorder Transmission

## 2024-08-21 ENCOUNTER — Encounter

## 2024-08-22 ENCOUNTER — Ambulatory Visit

## 2024-08-22 DIAGNOSIS — I639 Cerebral infarction, unspecified: Secondary | ICD-10-CM

## 2024-08-25 ENCOUNTER — Ambulatory Visit: Payer: Self-pay | Admitting: Cardiovascular Disease

## 2024-08-25 LAB — CUP PACEART REMOTE DEVICE CHECK: Date Time Interrogation Session: 20251007232244

## 2024-08-25 NOTE — Progress Notes (Signed)
 Remote Loop Recorder Transmission

## 2024-08-29 NOTE — Progress Notes (Signed)
 Remote Loop Recorder Transmission

## 2024-09-01 ENCOUNTER — Other Ambulatory Visit: Payer: Self-pay | Admitting: Internal Medicine

## 2024-09-01 DIAGNOSIS — E1165 Type 2 diabetes mellitus with hyperglycemia: Secondary | ICD-10-CM

## 2024-09-02 NOTE — Telephone Encounter (Signed)
 Requested Prescriptions  Pending Prescriptions Disp Refills   midodrine  (PROAMATINE ) 5 MG tablet [Pharmacy Med Name: MIDODRINE  HCL 5 MG TABLET] 180 tablet 1    Sig: TAKE 1 TABLET (5 MG TOTAL) BY MOUTH 2 (TWO) TIMES DAILY WITH A MEAL.     Not Delegated - Cardiovascular: Midodrine  Failed - 09/02/2024  1:26 PM      Failed - This refill cannot be delegated      Passed - Cr in normal range and within 360 days    Creat  Date Value Ref Range Status  03/08/2024 0.73 0.70 - 1.35 mg/dL Final   Creatinine, Urine  Date Value Ref Range Status  03/08/2024 165 20 - 320 mg/dL Final         Passed - ALT in normal range and within 360 days    ALT  Date Value Ref Range Status  03/08/2024 28 9 - 46 U/L Final         Passed - AST in normal range and within 360 days    AST  Date Value Ref Range Status  03/08/2024 18 10 - 35 U/L Final         Passed - Last BP in normal range    BP Readings from Last 1 Encounters:  03/18/24 110/68         Passed - Valid encounter within last 12 months    Recent Outpatient Visits           5 months ago Type 2 diabetes mellitus with hyperglycemia, without long-term current use of insulin (HCC)   [place] [facility] [place], Marsa PARAS, DO   5 months ago Type 2 diabetes mellitus with hyperglycemia, without long-term current use of insulin [facility])   [place] [facility] [place], [place] W, NP   7 months ago Type 2 diabetes mellitus with hyperglycemia, without long-term current use of insulin [facility])   [place] [facility], [place] W, NP   7 years ago URI with cough and congestion   [facility] [place] [facility], Lauraine CROME, PA-C               metFORMIN  (GLUCOPHAGE -XR) 500 MG 24 hr tablet [Pharmacy Med Name: METFORMIN  HCL ER 500 MG TABLET] 360 tablet 0    Sig: TAKE 2 TABLETS (1,000 MG TOTAL) BY MOUTH 2 (TWO) TIMES DAILY WITH A MEAL     Endocrinology:  Diabetes - Biguanides Failed - 09/02/2024  1:26  PM      Failed - B12 Level in normal range and within 720 days    Vitamin B-12  Date Value Ref Range Status  06/30/2022 850 180 - 914 pg/mL Final    Comment:    (NOTE) This assay is not validated for testing neonatal or myeloproliferative syndrome specimens for Vitamin B12 levels. Performed at [facility], [address], [place], [place] [address]          Failed - CBC within normal limits and completed in the last 12 months    WBC  Date Value Ref Range Status  03/08/2024 4.2 3.8 - 10.8 Thousand/uL Final   RBC  Date Value Ref Range Status  03/08/2024 5.09 4.20 - 5.80 Million/uL Final   Hemoglobin  Date Value Ref Range Status  03/08/2024 14.7 13.2 - 17.1 g/dL Final  93/87/7976 85.1 13.0 - 17.7 g/dL Final   HCT  Date Value Ref Range Status  03/08/2024 43.8 38.5 - 50.0 % Final  Hematocrit  Date Value Ref Range Status  04/28/2022 43.0 37.5 - 51.0 % Final   MCHC  Date Value Ref Range Status  03/08/2024 33.6 32.0 - 36.0 g/dL Final    Comment:    For adults, a slight decrease in the calculated MCHC value (in the range of 30 to 32 g/dL) is most likely not clinically significant; however, it should be interpreted with caution in correlation with other red cell parameters and the patient's clinical condition.    [facility]  Date Value Ref Range Status  03/08/2024 28.9 27.0 - 33.0 pg Final   MCV  Date Value Ref Range Status  03/08/2024 86.1 80.0 - 100.0 fL Final  04/28/2022 86 79 - 97 fL Final   No results found for: PLTCOUNTKUC, LABPLAT, POCPLA RDW  Date Value Ref Range Status  03/08/2024 13.4 11.0 - 15.0 % Final  04/28/2022 12.6 11.6 - 15.4 % Final         Passed - Cr in normal range and within 360 days    Creat  Date Value Ref Range Status  03/08/2024 0.73 0.70 - 1.35 mg/dL Final   Creatinine, Urine  Date Value Ref Range Status  03/08/2024 165 20 - 320 mg/dL Final         Passed - HBA1C is between 0 and 7.9 and within 180 days    Hgb A1c  MFr Bld  Date Value Ref Range Status  03/08/2024 7.8 (H) <5.7 % Final    Comment:    For someone without known diabetes, a hemoglobin A1c value of 6.5% or greater indicates that they may have  diabetes and this should be confirmed with a follow-up  test. . For someone with known diabetes, a value <7% indicates  that their diabetes is well controlled and a value  greater than or equal to 7% indicates suboptimal  control. A1c targets should be individualized based on  duration of diabetes, age, comorbid conditions, and  other considerations. . Currently, no consensus exists regarding use of hemoglobin A1c for diagnosis of diabetes for children. .          Passed - eGFR in normal range and within 360 days    GFR calc Af Amer  Date Value Ref Range Status  11/11/2015 >60 >60 mL/min Final    Comment:    (NOTE) The eGFR has been calculated using the CKD EPI equation. This calculation has not been validated in all clinical situations. eGFR's persistently <60 mL/min signify possible Chronic Kidney Disease.    GFR, Estimated  Date Value Ref Range Status  07/01/2023 56 (L) >60 mL/min Final    Comment:    (NOTE) Calculated using the CKD-EPI Creatinine Equation (2021)    GFR  Date Value Ref Range Status  03/05/2020 110.95 >60.00 mL/min Final   eGFR  Date Value Ref Range Status  03/08/2024 101 > OR = 60 mL/min/1.27m2 Final  11/05/2022 97 >59 mL/min/1.73 Final         Passed - Valid encounter within last 6 months    Recent Outpatient Visits           5 months ago Type 2 diabetes mellitus with hyperglycemia, without long-term current use of insulin [facility])   [place] [facility] [place], Marsa PARAS, DO   5 months ago Type 2 diabetes mellitus with hyperglycemia, without long-term current use of insulin [facility])   [place] [facility] [place], [place] W, NP   7 months ago Type 2  diabetes mellitus with hyperglycemia, without long-term  current use of insulin [facility])   [place] [facility] [place], [place] W, NP   7 years ago URI with cough and congestion   [facility] [place] [facility], Lauraine CROME, PA-C              Refused Prescriptions Disp Refills   glipiZIDE  (GLUCOTROL  XL) 5 MG 24 hr tablet [Pharmacy Med Name: GLIPIZIDE  ER 5 MG TABLET] 90 tablet 1    Sig: TAKE 1 TABLET BY MOUTH EVERY DAY WITH BREAKFAST     Endocrinology:  Diabetes - Sulfonylureas Passed - 09/02/2024  1:26 PM      Passed - HBA1C is between 0 and 7.9 and within 180 days    Hgb A1c MFr Bld  Date Value Ref Range Status  03/08/2024 7.8 (H) <5.7 % Final    Comment:    For someone without known diabetes, a hemoglobin A1c value of 6.5% or greater indicates that they may have  diabetes and this should be confirmed with a follow-up  test. . For someone with known diabetes, a value <7% indicates  that their diabetes is well controlled and a value  greater than or equal to 7% indicates suboptimal  control. A1c targets should be individualized based on  duration of diabetes, age, comorbid conditions, and  other considerations. . Currently, no consensus exists regarding use of hemoglobin A1c for diagnosis of diabetes for children. .          Passed - Cr in normal range and within 360 days    Creat  Date Value Ref Range Status  03/08/2024 0.73 0.70 - 1.35 mg/dL Final   Creatinine, Urine  Date Value Ref Range Status  03/08/2024 165 20 - 320 mg/dL Final         Passed - Valid encounter within last 6 months    Recent Outpatient Visits           5 months ago Type 2 diabetes mellitus with hyperglycemia, without long-term current use of insulin [facility])   [place] [facility] [place], Marsa PARAS, DO   5 months ago Type 2 diabetes mellitus with hyperglycemia, without long-term current use of insulin (HCC)   [place] [facility] [place], [place] W, NP   7 months ago Type 2 diabetes mellitus with  hyperglycemia, without long-term current use of insulin [facility])   [place] [facility] [place], [place] W, NP   7 years ago URI with cough and congestion   [facility] [employer], Lauraine CROME, PA-C               atorvastatin  (LIPITOR) 40 MG tablet [Pharmacy Med Name: ATORVASTATIN  40 MG TABLET] 90 tablet 0    Sig: TAKE 1 TABLET BY MOUTH EVERY DAY     Cardiovascular:  Antilipid - Statins Failed - 09/02/2024  1:26 PM      Failed - Lipid Panel in normal range within the last 12 months    Cholesterol  Date Value Ref Range Status  03/08/2024 134 <200 mg/dL Final   LDL Cholesterol (Calc)  Date Value Ref Range Status  03/08/2024 77 mg/dL (calc) Final    Comment:    Reference range: <100 . Desirable range <100 mg/dL for primary prevention;   <70 mg/dL for patients with CHD or diabetic patients  with > or = 2 CHD risk factors. SABRA LDL-C is now calculated using the Martin-Hopkins  calculation, which  is a validated novel method providing  better accuracy than the Friedewald equation in the  estimation of LDL-C.  Gladis APPLETHWAITE et al. SANDREA. 7986;689(80): 2061-2068  (http://education.[employer].com/faq/FAQ164)    HDL  Date Value Ref Range Status  03/08/2024 38 (L) > OR = 40 mg/dL Final   Triglycerides  Date Value Ref Range Status  03/08/2024 109 <150 mg/dL Final         Passed - Patient is not pregnant      Passed - Valid encounter within last 12 months    Recent Outpatient Visits           5 months ago Type 2 diabetes mellitus with hyperglycemia, without long-term current use of insulin [facility])   [place] [facility] [place], Marsa PARAS, DO   5 months ago Type 2 diabetes mellitus with hyperglycemia, without long-term current use of insulin [facility])   [place] [facility] [place], [place] W, NP   7 months ago Type 2 diabetes mellitus with hyperglycemia, without long-term current use of insulin [facility])   [place] [facility] [place], [place] W, NP   7 years ago URI with cough and congestion   [facility] [place] [facility], Lauraine CROME, PA-C

## 2024-09-02 NOTE — Telephone Encounter (Signed)
 Requested medications are due for refill today.  yes  Requested medications are on the active medications list.  yes  Last refill. 03/07/2024 #180 1 rf  Future visit scheduled.   yes  Notes to clinic.  Refill not delegated.    Requested Prescriptions  Pending Prescriptions Disp Refills   midodrine  (PROAMATINE ) 5 MG tablet [Pharmacy Med Name: MIDODRINE  HCL 5 MG TABLET] 180 tablet 1    Sig: TAKE 1 TABLET (5 MG TOTAL) BY MOUTH 2 (TWO) TIMES DAILY WITH A MEAL.     Not Delegated - Cardiovascular: Midodrine  Failed - 09/02/2024  1:26 PM      Failed - This refill cannot be delegated      Passed - Cr in normal range and within 360 days    Creat  Date Value Ref Range Status  03/08/2024 0.73 0.70 - 1.35 mg/dL Final   Creatinine, Urine  Date Value Ref Range Status  03/08/2024 165 20 - 320 mg/dL Final         Passed - ALT in normal range and within 360 days    ALT  Date Value Ref Range Status  03/08/2024 28 9 - 46 U/L Final         Passed - AST in normal range and within 360 days    AST  Date Value Ref Range Status  03/08/2024 18 10 - 35 U/L Final         Passed - Last BP in normal range    BP Readings from Last 1 Encounters:  03/18/24 110/68         Passed - Valid encounter within last 12 months    Recent Outpatient Visits           5 months ago Type 2 diabetes mellitus with hyperglycemia, without long-term current use of insulin (HCC)   [place] [facility] [place], Marsa PARAS, DO   5 months ago Type 2 diabetes mellitus with hyperglycemia, without long-term current use of insulin [facility])   [place] [facility] [place], [place] W, NP   7 months ago Type 2 diabetes mellitus with hyperglycemia, without long-term current use of insulin [facility])   [place] [facility] [place], [place] W, NP   7 years ago URI with cough and congestion   [facility] [place] Patient, Lauraine CROME, PA-C              Signed Prescriptions Disp  Refills   metFORMIN  (GLUCOPHAGE -XR) 500 MG 24 hr tablet 360 tablet 0    Sig: TAKE 2 TABLETS (1,000 MG TOTAL) BY MOUTH 2 (TWO) TIMES DAILY WITH A MEAL     Endocrinology:  Diabetes - Biguanides Failed - 09/02/2024  1:26 PM      Failed - B12 Level in normal range and within 720 days    Vitamin B-12  Date Value Ref Range Status  06/30/2022 850 180 - 914 pg/mL Final    Comment:    (NOTE) This assay is not validated for testing neonatal or myeloproliferative syndrome specimens for Vitamin B12 levels. Performed at [facility], [address] [place], [place] [address]          Failed - CBC within normal limits and completed in the last 12 months    WBC  Date Value Ref Range Status  03/08/2024 4.2 3.8 - 10.8 Thousand/uL Final   RBC  Date Value Ref Range Status  03/08/2024 5.09 4.20 - 5.80 Million/uL Final  Hemoglobin  Date Value Ref Range Status  03/08/2024 14.7 13.2 - 17.1 g/dL Final  93/87/7976 85.1 13.0 - 17.7 g/dL Final   HCT  Date Value Ref Range Status  03/08/2024 43.8 38.5 - 50.0 % Final   Hematocrit  Date Value Ref Range Status  04/28/2022 43.0 37.5 - 51.0 % Final   MCHC  Date Value Ref Range Status  03/08/2024 33.6 32.0 - 36.0 g/dL Final    Comment:    For adults, a slight decrease in the calculated MCHC value (in the range of 30 to 32 g/dL) is most likely not clinically significant; however, it should be interpreted with caution in correlation with other red cell parameters and the patient's clinical condition.    [facility]  Date Value Ref Range Status  03/08/2024 28.9 27.0 - 33.0 pg Final   MCV  Date Value Ref Range Status  03/08/2024 86.1 80.0 - 100.0 fL Final  04/28/2022 86 79 - 97 fL Final   No results found for: PLTCOUNTKUC, LABPLAT, POCPLA RDW  Date Value Ref Range Status  03/08/2024 13.4 11.0 - 15.0 % Final  04/28/2022 12.6 11.6 - 15.4 % Final         Passed - Cr in normal range and within 360 days    Creat  Date Value Ref  Range Status  03/08/2024 0.73 0.70 - 1.35 mg/dL Final   Creatinine, Urine  Date Value Ref Range Status  03/08/2024 165 20 - 320 mg/dL Final         Passed - HBA1C is between 0 and 7.9 and within 180 days    Hgb A1c MFr Bld  Date Value Ref Range Status  03/08/2024 7.8 (H) <5.7 % Final    Comment:    For someone without known diabetes, a hemoglobin A1c value of 6.5% or greater indicates that they may have  diabetes and this should be confirmed with a follow-up  test. . For someone with known diabetes, a value <7% indicates  that their diabetes is well controlled and a value  greater than or equal to 7% indicates suboptimal  control. A1c targets should be individualized based on  duration of diabetes, age, comorbid conditions, and  other considerations. . Currently, no consensus exists regarding use of hemoglobin A1c for diagnosis of diabetes for children. .          Passed - eGFR in normal range and within 360 days    GFR calc Af Amer  Date Value Ref Range Status  11/11/2015 >60 >60 mL/min Final    Comment:    (NOTE) The eGFR has been calculated using the CKD EPI equation. This calculation has not been validated in all clinical situations. eGFR's persistently <60 mL/min signify possible Chronic Kidney Disease.    GFR, Estimated  Date Value Ref Range Status  07/01/2023 56 (L) >60 mL/min Final    Comment:    (NOTE) Calculated using the CKD-EPI Creatinine Equation (2021)    GFR  Date Value Ref Range Status  03/05/2020 110.95 >60.00 mL/min Final   eGFR  Date Value Ref Range Status  03/08/2024 101 > OR = 60 mL/min/1.13m2 Final  11/05/2022 97 >59 mL/min/1.73 Final         Passed - Valid encounter within last 6 months    Recent Outpatient Visits           5 months ago Type 2 diabetes mellitus with hyperglycemia, without long-term current use of insulin [facility])   [place] [place], Marsa PARAS, DO   5 months ago Type 2 diabetes  mellitus with hyperglycemia, without long-term current use of insulin [facility])   [place] [facility] [place], [place], NP   7 months ago Type 2 diabetes mellitus with hyperglycemia, without long-term current use of insulin [facility])   [place] [facility] [place], [place], NP   7 years ago URI with cough and congestion   [facility] [place] [facility], Lauraine CROME, PA-C              Refused Prescriptions Disp Refills   glipiZIDE  (GLUCOTROL  XL) 5 MG 24 hr tablet [Pharmacy Med Name: GLIPIZIDE  ER 5 MG TABLET] 90 tablet 1    Sig: TAKE 1 TABLET BY MOUTH EVERY DAY WITH BREAKFAST     Endocrinology:  Diabetes - Sulfonylureas Passed - 09/02/2024  1:26 PM      Passed - HBA1C is between 0 and 7.9 and within 180 days    Hgb A1c MFr Bld  Date Value Ref Range Status  03/08/2024 7.8 (H) <5.7 % Final    Comment:    For someone without known diabetes, a hemoglobin A1c value of 6.5% or greater indicates that they may have  diabetes and this should be confirmed with a follow-up  test. . For someone with known diabetes, a value <7% indicates  that their diabetes is well controlled and a value  greater than or equal to 7% indicates suboptimal  control. A1c targets should be individualized based on  duration of diabetes, age, comorbid conditions, and  other considerations. . Currently, no consensus exists regarding use of hemoglobin A1c for diagnosis of diabetes for children. .          Passed - Cr in normal range and within 360 days    Creat  Date Value Ref Range Status  03/08/2024 0.73 0.70 - 1.35 mg/dL Final   Creatinine, Urine  Date Value Ref Range Status  03/08/2024 165 20 - 320 mg/dL Final         Passed - Valid encounter within last 6 months    Recent Outpatient Visits           5 months ago Type 2 diabetes mellitus with hyperglycemia, without long-term current use of insulin [facility])   [place] [facility] [place], Marsa PARAS, DO    5 months ago Type 2 diabetes mellitus with hyperglycemia, without long-term current use of insulin [facility])   [place] [facility] [place], [place] W, NP   7 months ago Type 2 diabetes mellitus with hyperglycemia, without long-term current use of insulin [facility])   [place] [facility] [place], [place] W, NP   7 years ago URI with cough and congestion   [facility] [employer], Lauraine CROME, PA-C               atorvastatin  (LIPITOR) 40 MG tablet [Pharmacy Med Name: ATORVASTATIN  40 MG TABLET] 90 tablet 0    Sig: TAKE 1 TABLET BY MOUTH EVERY DAY     Cardiovascular:  Antilipid - Statins Failed - 09/02/2024  1:26 PM      Failed - Lipid Panel in normal range within the last 12 months    Cholesterol  Date Value Ref Range Status  03/08/2024 134 <200 mg/dL Final   LDL Cholesterol (Calc)  Date Value Ref Range Status  03/08/2024 77 mg/dL (calc) Final    Comment:  Reference range: <100 . Desirable range <100 mg/dL for primary prevention;   <70 mg/dL for patients with CHD or diabetic patients  with > or = 2 CHD risk factors. SABRA LDL-C is now calculated using the Martin-Hopkins  calculation, which is a validated novel method providing  better accuracy than the Friedewald equation in the  estimation of LDL-C.  Gladis APPLETHWAITE et al. SANDREA. 7986;689(80): 2061-2068  (http://education.[employer].com/faq/FAQ164)    HDL  Date Value Ref Range Status  03/08/2024 38 (L) > OR = 40 mg/dL Final   Triglycerides  Date Value Ref Range Status  03/08/2024 109 <150 mg/dL Final         Passed - Patient is not pregnant      Passed - Valid encounter within last 12 months    Recent Outpatient Visits           5 months ago Type 2 diabetes mellitus with hyperglycemia, without long-term current use of insulin [facility]   [place] [facility] [place], Marsa PARAS, DO   5 months ago Type 2 diabetes mellitus with hyperglycemia, without long-term  current use of insulin [facility])   [place] [facility] [place], [place] W, NP   7 months ago Type 2 diabetes mellitus with hyperglycemia, without long-term current use of insulin [facility])   [place] [facility] [place], [place] W, NP   7 years ago URI with cough and congestion   [facility] [place] Patient, Lauraine CROME, PA-C

## 2024-09-08 ENCOUNTER — Ambulatory Visit: Admitting: Internal Medicine

## 2024-09-09 ENCOUNTER — Encounter: Payer: Self-pay | Admitting: Internal Medicine

## 2024-09-09 ENCOUNTER — Ambulatory Visit (INDEPENDENT_AMBULATORY_CARE_PROVIDER_SITE_OTHER): Admitting: Internal Medicine

## 2024-09-09 VITALS — BP 98/62 | Ht 75.0 in | Wt 193.8 lb

## 2024-09-09 DIAGNOSIS — Z8673 Personal history of transient ischemic attack (TIA), and cerebral infarction without residual deficits: Secondary | ICD-10-CM

## 2024-09-09 DIAGNOSIS — I700 Atherosclerosis of aorta: Secondary | ICD-10-CM

## 2024-09-09 DIAGNOSIS — M8700 Idiopathic aseptic necrosis of unspecified bone: Secondary | ICD-10-CM

## 2024-09-09 DIAGNOSIS — E785 Hyperlipidemia, unspecified: Secondary | ICD-10-CM

## 2024-09-09 DIAGNOSIS — E1169 Type 2 diabetes mellitus with other specified complication: Secondary | ICD-10-CM

## 2024-09-09 DIAGNOSIS — Z125 Encounter for screening for malignant neoplasm of prostate: Secondary | ICD-10-CM

## 2024-09-09 DIAGNOSIS — G40209 Localization-related (focal) (partial) symptomatic epilepsy and epileptic syndromes with complex partial seizures, not intractable, without status epilepticus: Secondary | ICD-10-CM | POA: Diagnosis not present

## 2024-09-09 DIAGNOSIS — E1165 Type 2 diabetes mellitus with hyperglycemia: Secondary | ICD-10-CM

## 2024-09-09 DIAGNOSIS — N400 Enlarged prostate without lower urinary tract symptoms: Secondary | ICD-10-CM

## 2024-09-09 DIAGNOSIS — K219 Gastro-esophageal reflux disease without esophagitis: Secondary | ICD-10-CM

## 2024-09-09 DIAGNOSIS — G43709 Chronic migraine without aura, not intractable, without status migrainosus: Secondary | ICD-10-CM

## 2024-09-09 DIAGNOSIS — I951 Orthostatic hypotension: Secondary | ICD-10-CM

## 2024-09-09 DIAGNOSIS — Z7984 Long term (current) use of oral hypoglycemic drugs: Secondary | ICD-10-CM

## 2024-09-09 DIAGNOSIS — I69354 Hemiplegia and hemiparesis following cerebral infarction affecting left non-dominant side: Secondary | ICD-10-CM

## 2024-09-09 LAB — HEMOGLOBIN A1C
Hgb A1c MFr Bld: 6.8 % — ABNORMAL HIGH (ref <5.7)
Mean Plasma Glucose: 148 mg/dL
eAG (mmol/L): 8.2 mmol/L

## 2024-09-09 LAB — CBC
HCT: 42.3 % (ref 38.5–50.0)
Hemoglobin: 14.3 g/dL (ref 13.2–17.1)
MCH: 28.8 pg (ref 27.0–33.0)
MCHC: 33.8 g/dL (ref 32.0–36.0)
MCV: 85.3 fL (ref 80.0–100.0)
MPV: 9.7 fL (ref 7.5–12.5)
Platelets: 207 Thousand/uL (ref 140–400)
RBC: 4.96 Million/uL (ref 4.20–5.80)
RDW: 13.2 % (ref 11.0–15.0)
WBC: 4.3 Thousand/uL (ref 3.8–10.8)

## 2024-09-09 LAB — PSA: PSA: 4.32 ng/mL — ABNORMAL HIGH (ref <=4.00)

## 2024-09-09 LAB — COMPREHENSIVE METABOLIC PANEL WITH GFR
AG Ratio: 2.6 (calc) — ABNORMAL HIGH (ref 1.0–2.5)
ALT: 20 U/L (ref 9–46)
AST: 16 U/L (ref 10–35)
Albumin: 5 g/dL (ref 3.6–5.1)
Alkaline phosphatase (APISO): 63 U/L (ref 35–144)
BUN/Creatinine Ratio: 24 (calc) — ABNORMAL HIGH (ref 6–22)
BUN: 37 mg/dL — ABNORMAL HIGH (ref 7–25)
CO2: 25 mmol/L (ref 20–32)
Calcium: 9.8 mg/dL (ref 8.6–10.3)
Chloride: 104 mmol/L (ref 98–110)
Creat: 1.54 mg/dL — ABNORMAL HIGH (ref 0.70–1.35)
Globulin: 1.9 g/dL (ref 1.9–3.7)
Glucose, Bld: 123 mg/dL — ABNORMAL HIGH (ref 65–99)
Potassium: 4.6 mmol/L (ref 3.5–5.3)
Sodium: 137 mmol/L (ref 135–146)
Total Bilirubin: 0.5 mg/dL (ref 0.2–1.2)
Total Protein: 6.9 g/dL (ref 6.1–8.1)
eGFR: 50 mL/min/1.73m2 — ABNORMAL LOW (ref >=60)

## 2024-09-09 LAB — LIPID PANEL
Cholesterol: 115 mg/dL (ref <200)
HDL: 32 mg/dL — ABNORMAL LOW (ref >=40)
LDL Cholesterol (Calc): 63 mg/dL
Non-HDL Cholesterol (Calc): 83 mg/dL (ref <130)
Total CHOL/HDL Ratio: 3.6 (calc) (ref <5.0)
Triglycerides: 122 mg/dL (ref <150)

## 2024-09-09 NOTE — Assessment & Plan Note (Signed)
 C-Met and lipid profile today Encouraged him to consume a low-fat diet Continue atorvastatin  40 mg daily

## 2024-09-09 NOTE — Assessment & Plan Note (Signed)
Walks with cane

## 2024-09-09 NOTE — Patient Instructions (Signed)

## 2024-09-09 NOTE — Assessment & Plan Note (Addendum)
 Continue keppra  750 mg at bedtime He will need to follow with neurology

## 2024-09-09 NOTE — Assessment & Plan Note (Signed)
 Try to avoid foods that trigger reflux Okay to take tums or pepcid OTC as needed if symptoms occur

## 2024-09-09 NOTE — Assessment & Plan Note (Signed)
 C-Met and lipid profile today Encouraged him to consume a low fat diet  Continue atorvastatin  40 mg, ASA 81 mg daily

## 2024-09-09 NOTE — Assessment & Plan Note (Signed)
 Continue nurtec 75 mg daily as needed He will need to follow with neurology

## 2024-09-09 NOTE — Assessment & Plan Note (Signed)
 CMET and lipid profile today Continue atorvastatin  40 mg and aspirin  81 mg daily Encouraged low fat diet

## 2024-09-09 NOTE — Progress Notes (Signed)
 Subjective:    Patient ID: Ralph Simpson, male    DOB: 02/01/1961, 63 y.o.   MRN: [id number]  HPI  Patient presents to clinic today for 25-month follow-up of chronic conditions.  AVN/OA bilateral hips: Status post bone grafting.  He does not take any medications for this.  He follows with orthopedics.  GERD: Triggered by meat.  He is not taking any medication for this.  Upper GI from 07/2021 reviewed.  Migraines: Triggered by weather changes.  These occur rarely.  He takes nurtec as needed with some relief of symptoms.  He follows with neurology.  DM2: His last A1c was 7.8%, 02/2024.  He is taking metformin  as prescribed.  His sugar range 150-200  He does not check his feet routinely.  His last eye exam was 12/2021.  Flu never.  Pneumovax never.  COVID never.  HLD with aortic atherosclerosis status post stroke: His last LDL was 77 triglycerides 109, 02/2024.  He denies myalgias on atorvastatin .  He is taking aspirin  as well.  He tries to consume a low-fat diet.  He follows with neurology.  Seizures: He denies recent seizures.  He is taking keppra  as prescribed.  He follows with neurology.  Orthostasis: Managed with midodrine .  ECG from 04/2023 reviewed.  Review of Systems     Past Medical History:  Diagnosis Date  . Allergy   . Avascular necrosis of bones of both hips (HCC)   . GERD (gastroesophageal reflux disease)   . Migraine   . Neutropenia   . Osteoarthritis of both hips   . Plantar fasciitis   . Seizures (HCC)    x 2 03/2021  . Trochanteric bursitis of right hip     Current Outpatient Medications  Medication Sig Dispense Refill  . aspirin  81 MG chewable tablet Chew 1 tablet (81 mg total) by mouth daily. 21 tablet 0  . atorvastatin  (LIPITOR) 40 MG tablet TAKE 1 TABLET BY MOUTH EVERY DAY 90 tablet 0  . Blood Glucose Monitoring Suppl (ONETOUCH VERIO FLEX SYSTEM) w/Device KIT USED TO CHECK BLOOD SUGAR ONCE A DAY. DX E11.9 1 kit 0  . clonazePAM (KLONOPIN) 1 MG tablet Take 1  mg by mouth 2 (two) times daily.    SABRA glucose blood (RELION GLUCOSE TEST STRIPS) test strip 1 each by Other route daily. One time daily 100 each 12  . Lancet Devices (RELION LANCING DEVICE) MISC Use to check blood sugar one a day.  DX: E11.9 100 each 1  . levETIRAcetam  (KEPPRA ) 500 MG tablet Take 1 tablet (500 mg total) by mouth 2 (two) times daily. (Patient taking differently: Take 1 tablet (500 mg total) by mouth 2 (two) times daily.) 60 tablet 6  . meclizine  (ANTIVERT ) 25 MG tablet TAKE 1 TABLET BY MOUTH 3 TIMES DAILY AS NEEDED FOR DIZZINESS. 90 tablet 1  . metFORMIN  (GLUCOPHAGE -XR) 500 MG 24 hr tablet TAKE 2 TABLETS (1,000 MG TOTAL) BY MOUTH 2 (TWO) TIMES DAILY WITH A MEAL 360 tablet 0  . midodrine  (PROAMATINE ) 5 MG tablet TAKE 1 TABLET (5 MG TOTAL) BY MOUTH 2 (TWO) TIMES DAILY WITH A MEAL. 180 tablet 1  . Rimegepant Sulfate (NURTEC) 75 MG TBDP TAKE 1 TAB AT ONSET OF MIGRAINE. MAY REPEAT IN 2 HRS, IF NEEDED. MAX DOSE: 2 TABS/DAY. 24 tablet 0   No current facility-administered medications for this visit.    Allergies  Allergen Reactions  . Aspirin      Causes brain bleed     Family History  Problem Relation Age of Onset  . Arthritis Mother   . Stroke Mother   . Diabetes Father   . Heart disease Father   . Hyperlipidemia Father   . Hypertension Father   . Kidney disease Maternal Grandfather   . Colon cancer Neg Hx   . Stomach cancer Neg Hx   . Rectal cancer Neg Hx     Social History   Socioeconomic History  . Marital status: Single    Spouse name: Not on file  . Number of children: 0  . Years of education: Not on file  . Highest education level: 10th grade  Occupational History    Comment: NA  Tobacco Use  . Smoking status: Former    Current packs/day: 0.00    Types: Cigarettes    Quit date: 11/17/1978    Years since quitting: 45.8    Passive exposure: Never  . Smokeless tobacco: Former    Types: Chew  . Tobacco comments:    quit 10-15 years ago  Vaping Use  .  Vaping status: Never Used  Substance and Sexual Activity  . Alcohol use: Not Currently  . Drug use: No  . Sexual activity: Yes  Other Topics Concern  . Not on file  Social History Narrative  . Not on file   Social Drivers of Health   Financial Resource Strain: Low Risk  (08/05/2024)   Overall Financial Resource Strain (CARDIA)   . Difficulty of Paying Living Expenses: Not hard at all  Food Insecurity: No Food Insecurity (08/05/2024)   Hunger Vital Sign   . Worried About Programme researcher, broadcasting/film/video in the Last Year: Never true   . Ran Out of Food in the Last Year: Never true  Transportation Needs: No Transportation Needs (08/05/2024)   PRAPARE - Transportation   . Lack of Transportation (Medical): No   . Lack of Transportation (Non-Medical): No  Physical Activity: Insufficiently Active (08/05/2024)   Exercise Vital Sign   . Days of Exercise per Week: 5 days   . Minutes of Exercise per Session: 20 min  Stress: No Stress Concern Present (08/05/2024)   [employer] of Occupational Health - Occupational Stress Questionnaire   . Feeling of Stress: Not at all  Social Connections: Socially Isolated (08/05/2024)   Social Connection and Isolation Panel   . Frequency of Communication with Friends and Family: More than three times a week   . Frequency of Social Gatherings with Friends and Family: Once a week   . Attends Religious Services: Never   . Active Member of Clubs or Organizations: No   . Attends Banker Meetings: Never   . Marital Status: Never married  Intimate Partner Violence: Not At Risk (08/05/2024)   Humiliation, Afraid, Rape, and Kick questionnaire   . Fear of Current or Ex-Partner: No   . Emotionally Abused: No   . Physically Abused: No   . Sexually Abused: No     Constitutional: Pt reports intermittent headache. Denies fever, malaise, fatigue, or abrupt weight changes.  HEENT: Denies eye pain, eye redness, ear pain, ringing in the ears, wax buildup, runny  nose, nasal congestion, bloody nose, or sore throat. Respiratory: Denies difficulty breathing, shortness of breath, cough or sputum production.   Cardiovascular: Denies chest pain, chest tightness, palpitations or swelling in the hands or feet.  Gastrointestinal: Denies abdominal pain, bloating, constipation, diarrhea or blood in the stool.  GU: Denies urgency, frequency, pain with urination, burning sensation, blood in urine, odor or discharge.  Musculoskeletal: Pt reports hip pain, difficulty with gait. Denies decrease in range of motion, muscle pain or joint swelling.  Skin: Denies redness, rashes, lesions or ulcercations.  Neurological: Denies dizziness, difficulty with memory, difficulty with speech or problems with balance and coordination.  Psych: Denies anxiety, depression, SI/HI.  No other specific complaints in a complete review of systems (except as listed in HPI above).  Objective:   Physical Exam BP 98/62 (BP Location: Right Arm, Patient Position: Sitting, Cuff Size: Normal)   Ht 6' 3 (1.905 m)   Wt 193 lb 12.8 oz (87.9 kg)   BMI 24.22 kg/m     Wt Readings from Last 3 Encounters:  08/05/24 193 lb (87.5 kg)  03/18/24 193 lb 6 oz (87.7 kg)  03/08/24 192 lb 9.6 oz (87.4 kg)    General: Appears his stated age, well developed, well nourished in NAD. Skin: Warm, dry and intact.  HEENT: Head: normal shape and size; Eyes: sclera white, no icterus, conjunctiva pink, PERRLA and EOMs intact;  Cardiovascular: Normal rate and rhythm. S1,S2 noted.  No murmur, rubs or gallops noted. No JVD or BLE edema. No carotid bruits noted. Pulmonary/Chest: Normal effort and positive vesicular breath sounds. No respiratory distress. No wheezes, rales or ronchi noted.  Abdomen: Limping gait with use of cane.  Neurological: Alert and oriented. Cranial nerves II-XII grossly intact. Coordination normal.  Psychiatric: Mood and affect normal. Behavior is normal. Judgment and thought content normal.    BMET    Component Value Date/Time   NA 139 03/08/2024 1410   NA 141 07/16/2021 0813   K 4.6 03/08/2024 1410   CL 104 03/08/2024 1410   CO2 27 03/08/2024 1410   GLUCOSE 112 03/08/2024 1410   BUN 28 (H) 03/08/2024 1410   BUN 20 07/16/2021 0813   CREATININE 0.73 03/08/2024 1410   CALCIUM  10.0 03/08/2024 1410   GFRNONAA 56 (L) 07/01/2023 1425   GFRAA >60 11/11/2015 1340    Lipid Panel     Component Value Date/Time   CHOL 134 03/08/2024 1410   TRIG 109 03/08/2024 1410   HDL 38 (L) 03/08/2024 1410   CHOLHDL 3.5 03/08/2024 1410   VLDL 16 02/11/2023 0643   LDLCALC 77 03/08/2024 1410    CBC    Component Value Date/Time   WBC 4.2 03/08/2024 1410   RBC 5.09 03/08/2024 1410   HGB 14.7 03/08/2024 1410   HGB 14.8 04/28/2022 1559   HCT 43.8 03/08/2024 1410   HCT 43.0 04/28/2022 1559   PLT 201 03/08/2024 1410   PLT 211 04/28/2022 1559   MCV 86.1 03/08/2024 1410   MCV 86 04/28/2022 1559   MCH 28.9 03/08/2024 1410   MCHC 33.6 03/08/2024 1410   RDW 13.4 03/08/2024 1410   RDW 12.6 04/28/2022 1559   LYMPHSABS 1.2 07/01/2023 1425   LYMPHSABS 1.1 04/28/2022 1559   MONOABS 0.5 07/01/2023 1425   EOSABS 0.1 07/01/2023 1425   EOSABS 0.1 04/28/2022 1559   BASOSABS 0.0 07/01/2023 1425   BASOSABS 0.0 04/28/2022 1559    Hgb A1C Lab Results  Component Value Date   HGBA1C 7.8 (H) 03/08/2024           Assessment & Plan:       RTC in 6 months, followup chronic conditions Angeline Laura, NP

## 2024-09-09 NOTE — Assessment & Plan Note (Signed)
 Continue midodrine  5 mg twice daily Encouraged adequate water intake Make position changes slowly

## 2024-09-09 NOTE — Assessment & Plan Note (Signed)
He will continue to follow with [facility]

## 2024-09-09 NOTE — Assessment & Plan Note (Signed)
 A1c today Urine microalbumin has been checked within the last year Reinforced low-carb diet and exercise for weight loss Continue metformin  1000 mg XR twice daily Encourage routine eye exam Encouraged routine foot exam He declines immunizations

## 2024-09-12 ENCOUNTER — Ambulatory Visit: Payer: Self-pay | Admitting: Internal Medicine

## 2024-09-12 DIAGNOSIS — R972 Elevated prostate specific antigen [PSA]: Secondary | ICD-10-CM

## 2024-09-12 DIAGNOSIS — R7989 Other specified abnormal findings of blood chemistry: Secondary | ICD-10-CM

## 2024-09-21 ENCOUNTER — Encounter

## 2024-09-22 ENCOUNTER — Encounter

## 2024-09-24 ENCOUNTER — Ambulatory Visit (INDEPENDENT_AMBULATORY_CARE_PROVIDER_SITE_OTHER)

## 2024-09-24 DIAGNOSIS — I639 Cerebral infarction, unspecified: Secondary | ICD-10-CM | POA: Diagnosis not present

## 2024-09-25 LAB — CUP PACEART REMOTE DEVICE CHECK: Date Time Interrogation Session: 20251107231600

## 2024-09-26 ENCOUNTER — Ambulatory Visit: Payer: Self-pay | Admitting: Cardiovascular Disease

## 2024-09-28 NOTE — Progress Notes (Signed)
 Remote Loop Recorder Transmission

## 2024-10-03 ENCOUNTER — Other Ambulatory Visit: Payer: Self-pay

## 2024-10-03 NOTE — Patient Outreach (Unsigned)
 Complex Care Management   Visit Note  10/03/2024  Name:  Ralph Simpson MRN: [id number] DOB: [birth year]  Situation: Referral received for Complex Care Management related to {Criteria:32550} I obtained verbal consent from {CHL AMB Patient/Caregiver:28184}.  Visit completed with {CHL AMB Patient/Caregiver:28184}  {VISIT LOCATION:32553}  Background:   Past Medical History:  Diagnosis Date   Allergy    Avascular necrosis of bones of both hips (HCC)    GERD (gastroesophageal reflux disease)    Migraine    Neutropenia    Osteoarthritis of both hips    Plantar fasciitis    Seizures (HCC)    x 2 03/2021   Trochanteric bursitis of right hip     Assessment: Patient Reported Symptoms:  Cognitive Cognitive Status: Alert and oriented to person, place, and time, Normal speech and language skills Cognitive/Intellectual Conditions Management [RPT]: None reported or documented in medical history or problem list   Health Maintenance Behaviors: Annual physical exam, Exercise, Healthy diet, Hobbies, Sleep adequate Healing Pattern: Average Health Facilitated by: Healthy diet, Rest  Neurological Neurological Review of Symptoms: No symptoms reported Neurological Management Strategies: Routine screening Neurological Self-Management Outcome: 5 (very good) Neurological Comment: History of seizures. Denies recent incidents. Reports taking medications as prescribed  HEENT HEENT Symptoms Reported: No symptoms reported HEENT Management Strategies: Routine screening HEENT Self-Management Outcome: 4 (good)    Cardiovascular      Respiratory      Endocrine      Gastrointestinal        Genitourinary      Integumentary      Musculoskeletal          Psychosocial       Quality of Family Relationships: supportive, helpful Do you feel physically threatened by others?: No    10/03/2024    PHQ2-9 Depression Screening   Little interest or pleasure in doing things    Feeling down, depressed,  or hopeless    PHQ-2 - Total Score    Trouble falling or staying asleep, or sleeping too much    Feeling tired or having little energy    Poor appetite or overeating     Feeling bad about yourself - or that you are a failure or have let yourself or your family down    Trouble concentrating on things, such as reading the newspaper or watching television    Moving or speaking so slowly that other people could have noticed.  Or the opposite - being so fidgety or restless that you have been moving around a lot more than usual    Thoughts that you would be better off dead, or hurting yourself in some way    PHQ2-9 Total Score    If you checked off any problems, how difficult have these problems made it for you to do your work, take care of things at home, or get along with other people    Depression Interventions/Treatment      There were no vitals filed for this visit.    Medications Reviewed Today     Reviewed by Karoline Lima, RN (Registered Nurse) on 10/03/24 at 1304  Med List Status: <None>   Medication Order Taking? Sig Documenting Provider Last Dose Status Informant  aspirin  81 MG chewable tablet [id number]  Chew 1 tablet (81 mg total) by mouth daily. Judithe Rocky BROCKS, NP  Active   atorvastatin  (LIPITOR) 40 MG tablet [id number]  TAKE 1 TABLET BY MOUTH EVERY DAY Baity, Angeline ORN, NP  Active   Blood Glucose  Monitoring Suppl [facility] VERIO FLEX SYSTEM) w/Device KIT [id number]  USED TO CHECK BLOOD SUGAR ONCE A DAY. DX E11.9 Antonette Angeline ORN, NP  Active   clonazePAM (KLONOPIN) 1 MG tablet [id number]  Take 1 mg by mouth 2 (two) times daily. [provider]  Active            Med Note HUMBERTO, JHONNIE RAMAN   Fri Jul 31, 2023  9:00 AM) Emily PRN for seizures  glucose blood (RELION GLUCOSE TEST STRIPS) test strip [id number]  1 each by Other route daily. One time daily Antonette Angeline ORN, NP  Active   Lancet Devices (RELION LANCING DEVICE) MISC [id number]  Use to check blood sugar one a day.  DX:  E11.9 Antonette Angeline ORN, NP  Active Self  levETIRAcetam  (KEPPRA ) 500 MG tablet [id number]  Take 1 tablet (500 mg total) by mouth 2 (two) times daily.  Patient taking differently: Take 500 mg by mouth 2 (two) times daily. 5 tabs at bedtime   Onita Duos, MD  Active Self  meclizine  (ANTIVERT ) 25 MG tablet [id number]  TAKE 1 TABLET BY MOUTH 3 TIMES DAILY AS NEEDED FOR DIZZINESS. Antonette Angeline ORN, NP  Active   metFORMIN  (GLUCOPHAGE -XR) 500 MG 24 hr tablet [id number]  TAKE 2 TABLETS (1,000 MG TOTAL) BY MOUTH 2 (TWO) TIMES DAILY WITH A MEAL Baity, Angeline ORN, NP  Active   midodrine  (PROAMATINE ) 5 MG tablet [id number]  TAKE 1 TABLET (5 MG TOTAL) BY MOUTH 2 (TWO) TIMES DAILY WITH A MEAL. Antonette Angeline ORN, NP  Active   Rimegepant Sulfate (NURTEC) 75 MG TBDP [id number]  TAKE 1 TAB AT ONSET OF MIGRAINE. MAY REPEAT IN 2 HRS, IF NEEDED. MAX DOSE: 2 TABS/DAY. Antonette Angeline ORN, NP  Active             Recommendation:   {RECOMMENDATONS:32554}  Follow Up Plan:   {FOLLOWUP:32559}  SIG ***

## 2024-10-04 NOTE — Patient Instructions (Signed)
 Thank you for allowing the Complex Care Management team to participate in your care. It was great speaking with you!  Congratulations! You have met your care management goals. Keep up the great work managing your care. Please do not hesitate to contact your PCP if your health needs change and you require additional outreach. The care team will gladly assist.    Jackson Acron [facility] RN Care Manager Direct Dial: [phone]  Fax: [phone] Website: [email or web address]

## 2024-10-22 ENCOUNTER — Encounter

## 2024-10-24 ENCOUNTER — Encounter

## 2024-10-25 ENCOUNTER — Ambulatory Visit (INDEPENDENT_AMBULATORY_CARE_PROVIDER_SITE_OTHER)

## 2024-10-26 ENCOUNTER — Ambulatory Visit: Payer: Self-pay | Admitting: Cardiovascular Disease

## 2024-10-26 LAB — CUP PACEART REMOTE DEVICE CHECK: Date Time Interrogation Session: 20251208232754

## 2024-10-26 NOTE — Progress Notes (Signed)
 Remote Loop Recorder Transmission

## 2024-11-01 ENCOUNTER — Encounter: Payer: Self-pay | Admitting: Emergency Medicine

## 2024-11-01 ENCOUNTER — Emergency Department
Admission: EM | Admit: 2024-11-01 | Discharge: 2024-11-01 | Disposition: A | Discharge status: Home / Self Care | Attending: Emergency Medicine | Admitting: Emergency Medicine

## 2024-11-01 ENCOUNTER — Other Ambulatory Visit: Payer: Self-pay

## 2024-11-01 ENCOUNTER — Ambulatory Visit: Payer: Self-pay

## 2024-11-01 DIAGNOSIS — R202 Paresthesia of skin: Secondary | ICD-10-CM | POA: Diagnosis present

## 2024-11-01 DIAGNOSIS — D72819 Decreased white blood cell count, unspecified: Secondary | ICD-10-CM | POA: Diagnosis not present

## 2024-11-01 DIAGNOSIS — E119 Type 2 diabetes mellitus without complications: Secondary | ICD-10-CM | POA: Diagnosis not present

## 2024-11-01 HISTORY — DX: Cerebral infarction, unspecified: I63.9

## 2024-11-01 LAB — CBC
HCT: 43.8 % (ref 39.0–52.0)
Hemoglobin: 14.8 g/dL (ref 13.0–17.0)
MCH: 28.4 pg (ref 26.0–34.0)
MCHC: 33.8 g/dL (ref 30.0–36.0)
MCV: 83.9 fL (ref 80.0–100.0)
Platelets: 200 K/uL (ref 150–400)
RBC: 5.22 MIL/uL (ref 4.22–5.81)
RDW: 13 % (ref 11.5–15.5)
WBC: 3.8 K/uL — ABNORMAL LOW (ref 4.0–10.5)
nRBC: 0 % (ref 0.0–0.2)

## 2024-11-01 LAB — COMPREHENSIVE METABOLIC PANEL WITH GFR
ALT: 21 U/L (ref 0–44)
AST: 22 U/L (ref 15–41)
Albumin: 4.7 g/dL (ref 3.5–5.0)
Alkaline Phosphatase: 72 U/L (ref 38–126)
Anion gap: 11 (ref 5–15)
BUN: 27 mg/dL — ABNORMAL HIGH (ref 8–23)
CO2: 26 mmol/L (ref 22–32)
Calcium: 10 mg/dL (ref 8.9–10.3)
Chloride: 100 mmol/L (ref 98–111)
Creatinine, Ser: 0.97 mg/dL (ref 0.61–1.24)
GFR, Estimated: >60 mL/min (ref >60)
Glucose, Bld: 286 mg/dL — ABNORMAL HIGH (ref 70–99)
Potassium: 4.8 mmol/L (ref 3.5–5.1)
Sodium: 137 mmol/L (ref 135–145)
Total Bilirubin: 0.4 mg/dL (ref 0.0–1.2)
Total Protein: 7.2 g/dL (ref 6.5–8.1)

## 2024-11-01 LAB — CBG MONITORING, ED: Glucose-Capillary: 233 mg/dL — ABNORMAL HIGH (ref 70–99)

## 2024-11-01 NOTE — ED Provider Notes (Signed)
 [facility] Provider Note    Event Date/Time   First MD Initiated Contact with Patient 11/01/24 1616     (approximate)   History   Chief Complaint: Weakness   HPI  Ralph Simpson is a 63 y.o. male with a history of migraine, seizures, stroke, diabetes who comes ED complaining of paresthesia starting in the right leg, migrating up the right leg to the right torso and right arm, lasted about 20 minutes and then resolved, no motor weakness or change in coordination or balance.  No falls or trauma, no fever, no vision change.  Describes having some mild headache.  He took all of his usual medications including 1 mg clonazepam, and symptoms resolved.  He has been back to baseline now for several hours.  Reports normal oral intake, no pain        Past Medical History:  Diagnosis Date   Allergy    Avascular necrosis of bones of both hips (HCC)    GERD (gastroesophageal reflux disease)    Migraine    Neutropenia    Osteoarthritis of both hips    Plantar fasciitis    Seizures (HCC)    x 2 03/2021   Stroke [facility])    Trochanteric bursitis of right hip     Current Outpatient Rx   Order #: [id number] Class: Normal   Order #: [id number] Class: Normal   Order #: [id number] Class: Normal   Order #: [id number] Class: Historical Med   Order #: [id number] Class: Normal   Order #: [id number] Class: Normal   Order #: [id number] Class: Normal   Order #: [id number] Class: Normal   Order #: [id number] Class: Normal   Order #: [id number] Class: Normal   Order #: [id number] Class: Historical Med   Order #: [id number] Class: Normal   Order #: [id number] Class: Historical Med    Past Surgical History:  Procedure Laterality Date   HERNIA REPAIR     HIP SURGERY     IR ANGIO INTRA EXTRACRAN SEL COM CAROTID INNOMINATE BILAT MOD SED  04/08/2021   IR ANGIO VERTEBRAL SEL SUBCLAVIAN INNOMINATE BILAT MOD SED  04/08/2021   IR ANGIO VERTEBRAL SEL VERTEBRAL UNI R MOD SED  02/10/2023   IR CT HEAD LTD   03/30/2023   IR PERCUTANEOUS ART THROMBECTOMY/INFUSION INTRACRANIAL INC DIAG ANGIO  02/10/2023   IR US  GUIDE VASC ACCESS RIGHT  04/08/2021   RADIOLOGY WITH ANESTHESIA N/A 02/10/2023   Procedure: IR WITH ANESTHESIA;  Surgeon: Radiologist, Medication, MD;  Location: [facility] OR;  Service: Radiology;  Laterality: N/A;    Physical Exam   Triage Vital Signs: ED Triage Vitals  Encounter Vitals Group     BP 11/01/24 1456 124/74     Girls Systolic BP Percentile --      Girls Diastolic BP Percentile --      Boys Systolic BP Percentile --      Boys Diastolic BP Percentile --      Pulse --      Resp 11/01/24 1456 16     Temp 11/01/24 1456 97.9 F (36.6 C)     Temp Source 11/01/24 1456 Oral     SpO2 11/01/24 1456 95 %     Weight 11/01/24 1458 193 lb 12.6 oz (87.9 kg)     Height --      Head Circumference --      Peak Flow --      Pain Score 11/01/24 1457 8     Pain Loc --      Pain Education --  Exclude from Growth Chart --     Most recent vital signs: Vitals:   11/01/24 1456  BP: 124/74  Resp: 16  Temp: 97.9 F (36.6 C)  SpO2: 95%    General: Awake, no distress.  CV:  Good peripheral perfusion.  Regular rate rhythm Resp:  Normal effort.  Clear lungs Abd:  No distention.  Soft nontender Other:  Cranial nerves III through XII intact.  No drift, normal sensation, normal speech and language, normal coordination and finger-to-nose.  Stroke scale 0   ED Results / Procedures / Treatments   Labs (all labs ordered are listed, but only abnormal results are displayed) Labs Reviewed  COMPREHENSIVE METABOLIC PANEL WITH GFR - Abnormal; Notable for the following components:      Result Value   Glucose, Bld 286 (*)    BUN 27 (*)    All other components within normal limits  CBC - Abnormal; Notable for the following components:   WBC 3.8 (*)    All other components within normal limits  CBG MONITORING, ED - Abnormal; Notable for the following components:   Glucose-Capillary 233 (*)     All other components within normal limits  URINALYSIS, ROUTINE W REFLEX MICROSCOPIC     EKG Interpreted by me Normal sinus rhythm rate of 71.  Normal axis and intervals.  Poor R wave progression.  No acute ischemic changes.   RADIOLOGY    PROCEDURES:  Procedures   MEDICATIONS ORDERED IN ED: Medications - No data to display   IMPRESSION / MDM / ASSESSMENT AND PLAN / ED COURSE  I reviewed the triage vital signs and the nursing notes.  DDx: Electrolyte derangement, AKI, anemia, focal seizure, migraine, anxiety.  Doubt intracranial hemorrhage or stroke or ACS  Patient's presentation is most consistent with acute presentation with potential threat to life or bodily function.  Patient presents with brief episode of paresthesia of the right side of trunk and extremities.  Symptoms resolved and he has remained back to normal baseline throughout the day.  Doubt infection, trauma, stroke.  Recommend continuing his medication, following up with his neurologist.       FINAL CLINICAL IMPRESSION(S) / ED DIAGNOSES   Final diagnoses:  Paresthesia     Rx / DC Orders   ED Discharge Orders     None        Note:  This document was prepared using Dragon voice recognition software and may include unintentional dictation errors.   Viviann Pastor, MD 11/01/24 [phone]

## 2024-11-01 NOTE — ED Triage Notes (Signed)
 STates has history of seizures.  This morning at 0600 states tingling came up right leg, up right side of body to right shoulder.  Denies current symptoms, feels wiped out.  AAOx3. Skin warm and dry. NAD

## 2024-11-01 NOTE — Discharge Instructions (Addendum)
 Your lab tests and examination are reassuring today.  Continue taking all of your medications as usual, and make an appointment to follow-up with your neurologist.

## 2024-11-01 NOTE — Telephone Encounter (Signed)
 FYI Only or Action Required?: FYI only for provider: ED advised.  Patient was last seen in primary care on 09/09/2024 by Antonette Angeline ORN, NP.  Called Nurse Triage reporting Neurologic Problem.  Symptoms began today.  Interventions attempted: OTC medications: ASA and Prescription medications: seizure pill.  Symptoms are: stable.  Triage Disposition: Go to ED Now (Notify PCP)  Patient/caregiver understands and will follow disposition?: Yes                                  1. SYMPTOM: What is the main symptom you are concerned about? (e.g., weakness, numbness)     Numbness/pins and needles sensation throughout entire right side of body from leg up through arm Reports that he experiences numbness whenever he has a migraine, but states symptoms are typically localized to one part of the body when this happens 2. ONSET: When did this start? (e.g., minutes, hours, days; while sleeping)     This morning, lasted 20 minutes, denies numbness at this time 7. OTHER SYMPTOMS: Do you have any other symptoms?     Generalized weakness/fatigue at this time, states he feels wiped out at this time, reports taking a nap in between episode of numbness and now   This RN advised ED. Patient and wife agreed to go.   Copied from CRM #[id number]. Topic: Clinical - Red Word Triage >> Nov 01, 2024  1:17 PM Teressa P wrote: Red Word that prompted transfer to Nurse Triage: pins and needles feeling on rt side of body, since then he feels fatigue.  Reason for Disposition  [1] Numbness (i.e., loss of sensation) of the face, arm / hand, or leg / foot on one side of the body AND [2] sudden onset AND [3] brief (now gone)  Protocols used: Neurologic Deficit-A-AH

## 2024-11-01 NOTE — Telephone Encounter (Signed)
 Agree with advice given.  Will look for ED note once completed

## 2024-11-22 ENCOUNTER — Encounter

## 2024-11-23 ENCOUNTER — Ambulatory Visit (INDEPENDENT_AMBULATORY_CARE_PROVIDER_SITE_OTHER)

## 2024-11-23 VITALS — BP 142/90 | HR 102 | Ht 75.0 in | Wt 188.6 lb

## 2024-11-23 DIAGNOSIS — G40209 Localization-related (focal) (partial) symptomatic epilepsy and epileptic syndromes with complex partial seizures, not intractable, without status epilepticus: Secondary | ICD-10-CM

## 2024-11-23 DIAGNOSIS — J069 Acute upper respiratory infection, unspecified: Secondary | ICD-10-CM | POA: Insufficient documentation

## 2024-11-23 DIAGNOSIS — I951 Orthostatic hypotension: Secondary | ICD-10-CM

## 2024-11-23 NOTE — Progress Notes (Signed)
 */-*////   / +    Acute Patient Visit  Physician: Gerrett Loman A Bayan Kushnir, MD  Patient: Ralph Simpson MRN: [id number] DOB: 02-03-1961 PCP: Antonette Angeline ORN, NP   This patient is not established with this clinician as his/her primary care physician. The visit was scheduled in a standard 20-minute slot that was not appropriate for the patients clinical complexity. Evaluation and management were performed to address prioritized concerns based on information available at the time of the encounter, with recommendations for appropriate follow-up and further evaluation.  The patient is to seek additional or emergency care if any change in their condition.    Subjective:   Chief Complaint  Patient presents with   Headache    Patient states he has a headache started 7 days ago. Sore throat and coughing.     HPI: The patient is a 64 y.o. male who presents today for:   Discussed the use of AI scribe software for clinical note transcription with the patient, who gave verbal consent to proceed.  History of Present Illness   Ralph Simpson is a 64 year old male with diabetes and seizure disorder who presents with cough and shortness of breath.  Respiratory symptoms - Persistent cough, frequent and sometimes severe, but not worsening - Shortness of breath with coughing only - Soreness in chest associated with coughing - No fever - No use of over-the-counter medications for cough  Glycemic control and diabetes symptoms - Diabetes managed with metformin , two tablets in the morning and two at night - Increased urination and thirst lately - Last blood sugar reading 286 mg/dL on more recent labs.  - Hemoglobin A1c was 6.8% in October  Seizure disorder - Seizures began around age 64, prior to a stroke - Currently taking Keppra  and Klonopin for seizure management - Experienced a seizure about one month ago  - Ongoing neurology f/u   Hypotension - 5 mg midodrine  BID taking  ROS:   As noted  in the HPI    ASSESMENT/PLAN:  Encounter Diagnoses  Name Primary?   URTI (acute upper respiratory infection) Yes    No orders of the defined types were placed in this encounter.   Assessment and Plan    Acute bronchitis Likely viral etiology with persistent cough and chest soreness. Lungs clear, no pneumonia. Antibiotics not indicated. SaO2 98% - Recommended over-the-counter cough medicine such as Delsym or Robitussin  - Advised use of honey for cough relief - but this may further elevate sugars  Type 2 diabetes mellitus with hyperglycemia Recent hyperglycemia with increased urination and thirst. Last hemoglobin A1c was 6.8 in October. Current blood sugar 286. Illness may exacerbate hyperglycemia. - Follow up with nurse practitioner to check hemoglobin A1c. - Monitor blood sugar levels - Consider additional diabetes medication if sugars remain high.  Epilepsy Recent seizure about a month ago. Currently on Keppra  and Klonopin as needed. Anxiety may be related to seizure medication. Neurology managing condition. - Continue current seizure medications. - Discuss anxiety and medication use with neurologist. - Avoid CBD oil due to potential interaction with seizure medications.  Hypertension Elevated blood pressure possibly due to illness and anxiety. Midodrine  used for low blood pressure. - Monitor blood pressure at home when relaxed. - Contact clinic if blood pressure remains high to discuss holding midodrine .        OBJECTIVE: Vitals:   11/23/24 1557  BP: (!) 142/90  Pulse: (!) 102  SpO2: 99%  Weight: 188 lb 9.6 oz (85.5 kg)  Height: 6' 3 (1.905 m)    Body mass index is 23.57 kg/m.   Physical Exam Vitals reviewed.  Constitutional:      Appearance: Normal appearance. Well-developed with normal weight.  Cardiovascular:     Rate and Rhythm: Normal rate and regular rhythm. Normal heart sounds. Normal peripheral pulses Pulmonary:     Normal breath sounds with  normal effort Skin:    General: Skin is warm and dry without noticeable rash. Neurological:     General: No focal deficit present.  Psychiatric:        Mood and Affect: Mood, behavior and cognition normal       Allergies Patient is allergic to aspirin .  Past Medical History Patient  has a past medical history of Allergy, Avascular necrosis of bones of both hips (HCC), GERD (gastroesophageal reflux disease), Migraine, Neutropenia, Osteoarthritis of both hips, Plantar fasciitis, Seizures (HCC), Stroke (HCC), and Trochanteric bursitis of right hip.  Surgical History Patient  has a past surgical history that includes Hip surgery; Hernia repair; IR ANGIO INTRA EXTRACRAN SEL COM CAROTID INNOMINATE BILAT MOD SED (04/08/2021); IR US  Guide Vasc Access Right (04/08/2021); IR ANGIO VERTEBRAL SEL SUBCLAVIAN INNOMINATE BILAT MOD SED (04/08/2021); Radiology with anesthesia (N/A, 02/10/2023); IR PERCUTANEOUS ART THROMBECTOMY/INFUSION INTRACRANIAL INC DIAG ANGIO (02/10/2023); IR ANGIO VERTEBRAL SEL VERTEBRAL UNI R MOD SED (02/10/2023); and IR CT Head Ltd (03/30/2023).  Family History Pateint's family history includes Arthritis in his mother; Diabetes in his father; Heart disease in his father; Hyperlipidemia in his father; Hypertension in his father; Kidney disease in his maternal grandfather; Stroke in his mother.  Social History Patient  reports that he quit smoking about 46 years ago. His smoking use included cigarettes. He has never been exposed to tobacco smoke. He has quit using smokeless tobacco.  His smokeless tobacco use included chew. He reports that he does not currently use alcohol. He reports that he does not use drugs.    11/23/2024

## 2024-11-24 ENCOUNTER — Encounter

## 2024-11-25 ENCOUNTER — Ambulatory Visit

## 2024-11-25 DIAGNOSIS — I639 Cerebral infarction, unspecified: Secondary | ICD-10-CM | POA: Diagnosis not present

## 2024-11-27 ENCOUNTER — Other Ambulatory Visit: Payer: Self-pay | Admitting: Internal Medicine

## 2024-11-27 DIAGNOSIS — E1165 Type 2 diabetes mellitus with hyperglycemia: Secondary | ICD-10-CM

## 2024-11-27 LAB — CUP PACEART REMOTE DEVICE CHECK: Date Time Interrogation Session: 20260108232321

## 2024-11-28 NOTE — Telephone Encounter (Signed)
 Requested medications are due for refill today.  unsure  Requested medications are on the active medications list.  yes  Last refill. 09/02/2024 #360 0 rf  Future visit scheduled.   yes  Notes to clinic.  Please review. Sig on med list differs from that of rx request.    Requested Prescriptions  Pending Prescriptions Disp Refills   metFORMIN  (GLUCOPHAGE -XR) 500 MG 24 hr tablet [Pharmacy Med Name: METFORMIN  HCL ER 500 MG TABLET] 90 tablet     Sig: TAKE 1 TABLET BY MOUTH EVERY DAY WITH BREAKFAST     Endocrinology:  Diabetes - Biguanides Failed - 11/28/2024  5:54 PM      Failed - B12 Level in normal range and within 720 days    Vitamin B-12  Date Value Ref Range Status  06/30/2022 850 180 - 914 pg/mL Final    Comment:    (NOTE) This assay is not validated for testing neonatal or myeloproliferative syndrome specimens for Vitamin B12 levels. Performed at [facility], [address] [place], [place] [address]          Failed - CBC within normal limits and completed in the last 12 months    WBC  Date Value Ref Range Status  11/01/2024 3.8 (L) 4.0 - 10.5 K/uL Final   RBC  Date Value Ref Range Status  11/01/2024 5.22 4.22 - 5.81 MIL/uL Final   Hemoglobin  Date Value Ref Range Status  11/01/2024 14.8 13.0 - 17.0 g/dL Final  93/87/7976 85.1 13.0 - 17.7 g/dL Final   HCT  Date Value Ref Range Status  11/01/2024 43.8 39.0 - 52.0 % Final   Hematocrit  Date Value Ref Range Status  04/28/2022 43.0 37.5 - 51.0 % Final   MCHC  Date Value Ref Range Status  11/01/2024 33.8 30.0 - 36.0 g/dL Final   [facility]  Date Value Ref Range Status  11/01/2024 28.4 26.0 - 34.0 pg Final   MCV  Date Value Ref Range Status  11/01/2024 83.9 80.0 - 100.0 fL Final  04/28/2022 86 79 - 97 fL Final   No results found for: PLTCOUNTKUC, LABPLAT, POCPLA RDW  Date Value Ref Range Status  11/01/2024 13.0 11.5 - 15.5 % Final  04/28/2022 12.6 11.6 - 15.4 % Final         Passed - Cr in  normal range and within 360 days    Creat  Date Value Ref Range Status  09/09/2024 1.54 (H) 0.70 - 1.35 mg/dL Final   Creatinine, Ser  Date Value Ref Range Status  11/01/2024 0.97 0.61 - 1.24 mg/dL Final   Creatinine, Urine  Date Value Ref Range Status  03/08/2024 165 20 - 320 mg/dL Final         Passed - HBA1C is between 0 and 7.9 and within 180 days    Hgb A1c MFr Bld  Date Value Ref Range Status  09/09/2024 6.8 (H) <5.7 % Final    Comment:    For someone without known diabetes, a hemoglobin A1c value of 6.5% or greater indicates that they may have  diabetes and this should be confirmed with a follow-up  test. . For someone with known diabetes, a value <7% indicates  that their diabetes is well controlled and a value  greater than or equal to 7% indicates suboptimal  control. A1c targets should be individualized based on  duration of diabetes, age, comorbid conditions, and  other considerations. . Currently, no consensus exists regarding use of hemoglobin A1c  for diagnosis of diabetes for children. .          Passed - eGFR in normal range and within 360 days    GFR calc Af Amer  Date Value Ref Range Status  11/11/2015 >60 >60 mL/min Final    Comment:    (NOTE) The eGFR has been calculated using the CKD EPI equation. This calculation has not been validated in all clinical situations. eGFR's persistently <60 mL/min signify possible Chronic Kidney Disease.    GFR, Estimated  Date Value Ref Range Status  11/01/2024 >60 >60 mL/min Final    Comment:    (NOTE) Calculated using the CKD-EPI Creatinine Equation (2021)    GFR  Date Value Ref Range Status  03/05/2020 110.95 >60.00 mL/min Final   eGFR  Date Value Ref Range Status  09/09/2024 50 (L) > OR = 60 mL/min/1.42m2 Final  11/05/2022 97 >59 mL/min/1.73 Final         Passed - Valid encounter within last 6 months    Recent Outpatient Visits           5 days ago URTI (acute upper respiratory infection)    [place] [facility], [place] A, MD   2 months ago Type 2 diabetes mellitus with hyperglycemia, without long-term current use of insulin [facility])   [place] [facility] [place], [place] W, NP   8 months ago Type 2 diabetes mellitus with hyperglycemia, without long-term current use of insulin [facility])   [place] [facility] [place], [place] PARAS, DO   8 months ago Type 2 diabetes mellitus with hyperglycemia, without long-term current use of insulin [facility])   [place] [facility] [place], [place] W, NP   10 months ago Type 2 diabetes mellitus with hyperglycemia, without long-term current use of insulin [facility])   [place] [facility] [place], Angeline ORN, [place]

## 2024-11-29 ENCOUNTER — Ambulatory Visit: Payer: Self-pay | Admitting: Cardiovascular Disease

## 2024-11-29 NOTE — Telephone Encounter (Signed)
 Requested by interface  surescripts. Last OV 2 months ago . Requested Prescriptions  Pending Prescriptions Disp Refills   metFORMIN  (GLUCOPHAGE -XR) 500 MG 24 hr tablet [Pharmacy Med Name: METFORMIN  HCL ER 500 MG TABLET] 360 tablet 0    Sig: TAKE 2 TABLETS (1,000 MG TOTAL) BY MOUTH 2 (TWO) TIMES DAILY WITH A MEAL     Endocrinology:  Diabetes - Biguanides Failed - 11/29/2024  7:55 AM      Failed - B12 Level in normal range and within 720 days    Vitamin B-12  Date Value Ref Range Status  06/30/2022 850 180 - 914 pg/mL Final    Comment:    (NOTE) This assay is not validated for testing neonatal or myeloproliferative syndrome specimens for Vitamin B12 levels. Performed at [facility], [address], [place], [place] [address]          Failed - CBC within normal limits and completed in the last 12 months    WBC  Date Value Ref Range Status  11/01/2024 3.8 (L) 4.0 - 10.5 K/uL Final   RBC  Date Value Ref Range Status  11/01/2024 5.22 4.22 - 5.81 MIL/uL Final   Hemoglobin  Date Value Ref Range Status  11/01/2024 14.8 13.0 - 17.0 g/dL Final  93/87/7976 85.1 13.0 - 17.7 g/dL Final   HCT  Date Value Ref Range Status  11/01/2024 43.8 39.0 - 52.0 % Final   Hematocrit  Date Value Ref Range Status  04/28/2022 43.0 37.5 - 51.0 % Final   MCHC  Date Value Ref Range Status  11/01/2024 33.8 30.0 - 36.0 g/dL Final   [facility]  Date Value Ref Range Status  11/01/2024 28.4 26.0 - 34.0 pg Final   MCV  Date Value Ref Range Status  11/01/2024 83.9 80.0 - 100.0 fL Final  04/28/2022 86 79 - 97 fL Final   No results found for: PLTCOUNTKUC, LABPLAT, POCPLA RDW  Date Value Ref Range Status  11/01/2024 13.0 11.5 - 15.5 % Final  04/28/2022 12.6 11.6 - 15.4 % Final         Passed - Cr in normal range and within 360 days    Creat  Date Value Ref Range Status  09/09/2024 1.54 (H) 0.70 - 1.35 mg/dL Final   Creatinine, Ser  Date Value Ref Range Status  11/01/2024 0.97 0.61 -  1.24 mg/dL Final   Creatinine, Urine  Date Value Ref Range Status  03/08/2024 165 20 - 320 mg/dL Final         Passed - HBA1C is between 0 and 7.9 and within 180 days    Hgb A1c MFr Bld  Date Value Ref Range Status  09/09/2024 6.8 (H) <5.7 % Final    Comment:    For someone without known diabetes, a hemoglobin A1c value of 6.5% or greater indicates that they may have  diabetes and this should be confirmed with a follow-up  test. . For someone with known diabetes, a value <7% indicates  that their diabetes is well controlled and a value  greater than or equal to 7% indicates suboptimal  control. A1c targets should be individualized based on  duration of diabetes, age, comorbid conditions, and  other considerations. . Currently, no consensus exists regarding use of hemoglobin A1c for diagnosis of diabetes for children. .          Passed - eGFR in normal range and within 360 days    GFR calc Af [place]  Date Value Ref  Range Status  11/11/2015 >60 >60 mL/min Final    Comment:    (NOTE) The eGFR has been calculated using the CKD EPI equation. This calculation has not been validated in all clinical situations. eGFR's persistently <60 mL/min signify possible Chronic Kidney Disease.    GFR, Estimated  Date Value Ref Range Status  11/01/2024 >60 >60 mL/min Final    Comment:    (NOTE) Calculated using the CKD-EPI Creatinine Equation (2021)    GFR  Date Value Ref Range Status  03/05/2020 110.95 >60.00 mL/min Final   eGFR  Date Value Ref Range Status  09/09/2024 50 (L) > OR = 60 mL/min/1.67m2 Final  11/05/2022 97 >59 mL/min/1.73 Final         Passed - Valid encounter within last 6 months    Recent Outpatient Visits           6 days ago URTI (acute upper respiratory infection)   [place] [facility], [place] A, MD   2 months ago Type 2 diabetes mellitus with hyperglycemia, without long-term current use of insulin [facility])   [place] [facility] [place], [place] W, NP   8 months ago Type 2 diabetes mellitus with hyperglycemia, without long-term current use of insulin [facility])   [place] [facility] [place], Marsa PARAS, DO   8 months ago Type 2 diabetes mellitus with hyperglycemia, without long-term current use of insulin [facility])   [place] [facility] [place], [place] W, NP   10 months ago Type 2 diabetes mellitus with hyperglycemia, without long-term current use of insulin [facility])   [place] [facility] [place], Angeline ORN, [place]

## 2024-11-29 NOTE — Progress Notes (Signed)
 Remote Loop Recorder Transmission

## 2024-12-09 ENCOUNTER — Telehealth: Payer: Self-pay | Admitting: Internal Medicine

## 2024-12-09 ENCOUNTER — Other Ambulatory Visit: Payer: Self-pay | Admitting: Internal Medicine

## 2024-12-09 NOTE — Telephone Encounter (Signed)
 Appears to  have new order placed today, 12/09/24 for [id number].  Please call to advise patient.    Copied from CRM [id number]. Topic: Clinical - Medication Refill >> Dec 09, 2024  4:06 PM Alfonso ORN wrote: Medication:  atorvastatin  (LIPITOR) 40 MG tablet   Has the patient contacted their pharmacy? Yes  This is the patient's preferred pharmacy:  [facility] [address] [place] - [address] [place] [address] Phone: [phone] Fax: [phone]   Is this the correct pharmacy for this prescription? Yes If no, delete pharmacy and type the correct one.   Has the prescription been filled recently? No  Is the patient out of the medication? no  Has the patient been seen for an appointment in the last year OR does the patient have an upcoming appointment? Yes  Can we respond through MyChart? No

## 2024-12-09 NOTE — Telephone Encounter (Signed)
 Requested Prescriptions  Pending Prescriptions Disp Refills   atorvastatin  (LIPITOR) 40 MG tablet [Pharmacy Med Name: ATORVASTATIN  40 MG TABLET] 90 tablet 0    Sig: TAKE 1 TABLET BY MOUTH EVERY DAY     Cardiovascular:  Antilipid - Statins Failed - 12/09/2024  3:38 PM      Failed - Lipid Panel in normal range within the last 12 months    Cholesterol  Date Value Ref Range Status  09/09/2024 115 <200 mg/dL Final   LDL Cholesterol (Calc)  Date Value Ref Range Status  09/09/2024 63 mg/dL (calc) Final    Comment:    Reference range: <100 . Desirable range <100 mg/dL for primary prevention;   <70 mg/dL for patients with CHD or diabetic patients  with > or = 2 CHD risk factors. SABRA LDL-C is now calculated using the Martin-Hopkins  calculation, which is a validated novel method providing  better accuracy than the Friedewald equation in the  estimation of LDL-C.  Gladis APPLETHWAITE et al. SANDREA. 7986;689(80): 2061-2068  (http://education.[employer].com/faq/FAQ164)    HDL  Date Value Ref Range Status  09/09/2024 32 (L) > OR = 40 mg/dL Final   Triglycerides  Date Value Ref Range Status  09/09/2024 122 <150 mg/dL Final         Passed - Patient is not pregnant      Passed - Valid encounter within last 12 months    Recent Outpatient Visits           2 weeks ago URTI (acute upper respiratory infection)   [place] [facility], [place] A, MD   3 months ago Type 2 diabetes mellitus with hyperglycemia, without long-term current use of insulin [facility])   [place] [facility] [place], [place] W, NP   8 months ago Type 2 diabetes mellitus with hyperglycemia, without long-term current use of insulin [facility])   [place] [facility] [place], Marsa PARAS, DO   9 months ago Type 2 diabetes mellitus with hyperglycemia, without long-term current use of insulin [facility])   [place] [facility] [place], [place] W, NP   10 months  ago Type 2 diabetes mellitus with hyperglycemia, without long-term current use of insulin [facility])    [place] [facility] [place], Angeline ORN, [place]

## 2024-12-13 ENCOUNTER — Other Ambulatory Visit

## 2024-12-13 MED ORDER — ATORVASTATIN CALCIUM 40 MG PO TABS: 40.0000 mg | ORAL_TABLET | Freq: Every day | ORAL | 0 refills | Status: AC

## 2024-12-13 NOTE — Telephone Encounter (Signed)
 Rx sent to pharmacy

## 2024-12-15 ENCOUNTER — Other Ambulatory Visit

## 2024-12-15 DIAGNOSIS — R972 Elevated prostate specific antigen [PSA]: Secondary | ICD-10-CM

## 2024-12-15 DIAGNOSIS — R7989 Other specified abnormal findings of blood chemistry: Secondary | ICD-10-CM

## 2024-12-16 ENCOUNTER — Ambulatory Visit: Payer: Self-pay | Admitting: Internal Medicine

## 2024-12-16 DIAGNOSIS — R972 Elevated prostate specific antigen [PSA]: Secondary | ICD-10-CM

## 2024-12-16 DIAGNOSIS — N1832 Chronic kidney disease, stage 3b: Secondary | ICD-10-CM

## 2024-12-16 LAB — BASIC METABOLIC PANEL WITH GFR
BUN/Creatinine Ratio: 17 (calc) (ref 6–22)
BUN: 29 mg/dL — ABNORMAL HIGH (ref 7–25)
CO2: 29 mmol/L (ref 20–32)
Calcium: 9.7 mg/dL (ref 8.6–10.3)
Chloride: 102 mmol/L (ref 98–110)
Creat: 1.7 mg/dL — ABNORMAL HIGH (ref 0.70–1.35)
Glucose, Bld: 113 mg/dL — ABNORMAL HIGH (ref 65–99)
Potassium: 4.5 mmol/L (ref 3.5–5.3)
Sodium: 140 mmol/L (ref 135–146)
eGFR: 44 mL/min/{1.73_m2} — ABNORMAL LOW

## 2024-12-16 LAB — PSA: PSA: 5.8 ng/mL — ABNORMAL HIGH

## 2024-12-16 MED ORDER — GLIPIZIDE ER 5 MG PO TB24: 5.0000 mg | ORAL_TABLET | Freq: Every day | ORAL | 3 refills | Status: AC

## 2024-12-17 ENCOUNTER — Observation Stay (HOSPITAL_COMMUNITY)

## 2024-12-17 ENCOUNTER — Emergency Department (HOSPITAL_COMMUNITY)

## 2024-12-17 ENCOUNTER — Observation Stay (HOSPITAL_COMMUNITY)
Admission: EM | Admit: 2024-12-17 | Discharge: 2024-12-17 | Disposition: A | Discharge status: Home / Self Care | Attending: Emergency Medicine | Admitting: Emergency Medicine

## 2024-12-17 ENCOUNTER — Other Ambulatory Visit: Payer: Self-pay

## 2024-12-17 DIAGNOSIS — E785 Hyperlipidemia, unspecified: Secondary | ICD-10-CM | POA: Diagnosis not present

## 2024-12-17 DIAGNOSIS — G40209 Localization-related (focal) (partial) symptomatic epilepsy and epileptic syndromes with complex partial seizures, not intractable, without status epilepticus: Secondary | ICD-10-CM | POA: Diagnosis not present

## 2024-12-17 DIAGNOSIS — Z79899 Other long term (current) drug therapy: Secondary | ICD-10-CM | POA: Insufficient documentation

## 2024-12-17 DIAGNOSIS — Z7984 Long term (current) use of oral hypoglycemic drugs: Secondary | ICD-10-CM | POA: Insufficient documentation

## 2024-12-17 DIAGNOSIS — E1169 Type 2 diabetes mellitus with other specified complication: Secondary | ICD-10-CM | POA: Diagnosis present

## 2024-12-17 DIAGNOSIS — I951 Orthostatic hypotension: Secondary | ICD-10-CM | POA: Diagnosis not present

## 2024-12-17 DIAGNOSIS — R202 Paresthesia of skin: Secondary | ICD-10-CM | POA: Diagnosis not present

## 2024-12-17 DIAGNOSIS — E1165 Type 2 diabetes mellitus with hyperglycemia: Secondary | ICD-10-CM | POA: Diagnosis not present

## 2024-12-17 DIAGNOSIS — Z87891 Personal history of nicotine dependence: Secondary | ICD-10-CM | POA: Insufficient documentation

## 2024-12-17 DIAGNOSIS — R200 Anesthesia of skin: Secondary | ICD-10-CM | POA: Diagnosis not present

## 2024-12-17 DIAGNOSIS — Z7982 Long term (current) use of aspirin: Secondary | ICD-10-CM | POA: Insufficient documentation

## 2024-12-17 DIAGNOSIS — Z8673 Personal history of transient ischemic attack (TIA), and cerebral infarction without residual deficits: Secondary | ICD-10-CM | POA: Insufficient documentation

## 2024-12-17 LAB — DIFFERENTIAL
Abs Immature Granulocytes: 0.01 10*3/uL (ref 0.00–0.07)
Basophils Absolute: 0 10*3/uL (ref 0.0–0.1)
Basophils Relative: 1 %
Eosinophils Absolute: 0.1 10*3/uL (ref 0.0–0.5)
Eosinophils Relative: 2 %
Immature Granulocytes: 0 %
Lymphocytes Relative: 32 %
Lymphs Abs: 1.3 10*3/uL (ref 0.7–4.0)
Monocytes Absolute: 0.4 10*3/uL (ref 0.1–1.0)
Monocytes Relative: 10 %
Neutro Abs: 2.3 10*3/uL (ref 1.7–7.7)
Neutrophils Relative %: 55 %

## 2024-12-17 LAB — I-STAT CHEM 8, ED
BUN: 24 mg/dL — ABNORMAL HIGH (ref 8–23)
Calcium, Ion: 1.17 mmol/L (ref 1.15–1.40)
Chloride: 102 mmol/L (ref 98–111)
Creatinine, Ser: 0.9 mg/dL (ref 0.61–1.24)
Glucose, Bld: 168 mg/dL — ABNORMAL HIGH (ref 70–99)
HCT: 41 % (ref 39.0–52.0)
Hemoglobin: 13.9 g/dL (ref 13.0–17.0)
Potassium: 4.6 mmol/L (ref 3.5–5.1)
Sodium: 138 mmol/L (ref 135–145)
TCO2: 23 mmol/L (ref 22–32)

## 2024-12-17 LAB — COMPREHENSIVE METABOLIC PANEL WITH GFR
ALT: 18 U/L (ref 0–44)
AST: 18 U/L (ref 15–41)
Albumin: 4.5 g/dL (ref 3.5–5.0)
Alkaline Phosphatase: 78 U/L (ref 38–126)
Anion gap: 9 (ref 5–15)
BUN: 22 mg/dL (ref 8–23)
CO2: 27 mmol/L (ref 22–32)
Calcium: 9.9 mg/dL (ref 8.9–10.3)
Chloride: 103 mmol/L (ref 98–111)
Creatinine, Ser: 0.84 mg/dL (ref 0.61–1.24)
GFR, Estimated: >60 mL/min
Glucose, Bld: 170 mg/dL — ABNORMAL HIGH (ref 70–99)
Potassium: 4.8 mmol/L (ref 3.5–5.1)
Sodium: 139 mmol/L (ref 135–145)
Total Bilirubin: 0.5 mg/dL (ref 0.0–1.2)
Total Protein: 7 g/dL (ref 6.5–8.1)

## 2024-12-17 LAB — ETHANOL: Alcohol, Ethyl (B): <15 mg/dL

## 2024-12-17 LAB — APTT: aPTT: 32 s (ref 24–36)

## 2024-12-17 LAB — CBC
HCT: 41.5 % (ref 39.0–52.0)
Hemoglobin: 14.5 g/dL (ref 13.0–17.0)
MCH: 29.5 pg (ref 26.0–34.0)
MCHC: 34.9 g/dL (ref 30.0–36.0)
MCV: 84.5 fL (ref 80.0–100.0)
Platelets: 177 10*3/uL (ref 150–400)
RBC: 4.91 MIL/uL (ref 4.22–5.81)
RDW: 12.9 % (ref 11.5–15.5)
WBC: 4.2 10*3/uL (ref 4.0–10.5)
nRBC: 0 % (ref 0.0–0.2)

## 2024-12-17 LAB — PROTIME-INR
INR: 1 (ref 0.8–1.2)
Prothrombin Time: 13.5 s (ref 11.4–15.2)

## 2024-12-17 LAB — CBG MONITORING, ED: Glucose-Capillary: 177 mg/dL — ABNORMAL HIGH (ref 70–99)

## 2024-12-17 MED ORDER — IOHEXOL 350 MG/ML SOLN
75.0000 mL | Freq: Once | INTRAVENOUS | Status: AC | PRN
  Administered 2024-12-17: 75 mL via INTRAVENOUS

## 2024-12-17 NOTE — ED Notes (Signed)
 Patient transported to CT

## 2024-12-17 NOTE — Assessment & Plan Note (Signed)
 12/17/24 continue with midodrine . Hold if SBP >140

## 2024-12-17 NOTE — Plan of Care (Signed)
 I was called in to weigh in on the imaging findings for this patient who came in with transient right arm numbness/weakness that lasted less than an hour this morning.  CT head was done which had some abnormal findings of hyperdensity within the posterior left frontal and parietal cortex with associated sulcal effacement most concerning for acute infarct with hemorrhagic transformation and a long list of differential diagnosis.  An MRI and CT angio head and neck was done. I compared his imaging from this year to his prior imaging and those left frontal findings are old and persistent-left frontal sulcal hyperdensity on FLAIR images that has been present at least since 2024. There are no new findings. He has a low ABCD2 score. I do not think he needs any inpatient workup from a stroke standpoint He should follow-up with outpatient neurology This plan was discussed with the ED providers and the hospitalist team.   -- Eligio Lav, MD Neurologist [facility]

## 2024-12-17 NOTE — ED Triage Notes (Addendum)
 Patient reports brief right arm numbness/weakness at 0515 this morning , symptoms resolved at arrival , speech clear with no facial asymmetry , no arm drift with equal strong grips . Alert and oriented . Denies pain /respirations unlabored. History of Stroke /Seizures.

## 2024-12-17 NOTE — Assessment & Plan Note (Signed)
 12/17/24 continue lipitor. Send lipid panel.

## 2024-12-17 NOTE — Assessment & Plan Note (Signed)
 12/17/24 check A1c. Add SSI.

## 2024-12-17 NOTE — Assessment & Plan Note (Addendum)
 12/17/24 Admit to Observation telemetry bed. Neurology consulted. MRI brian ordered. Check echo. Hold on ASA/lovenox /heparin /plavix  due to possible hemorrhagic conversion. Defer antiplatelet and chemical DVT prophylaxis decision to neurology.  *update. Had neurology review all pt's imagining included CT head, CTA head/neck and MRI brain. Conclusion is that there is no hemorrhagic conversion or new CVA. All his findings are old and not new. Pt does not require any further hospitalization and can be discharged to home.

## 2024-12-17 NOTE — H&P (Signed)
 " History and Physical    Ralph Simpson FMW:[id number] DOB: 31-Oct-1961 DOA: 12/17/2024  DOS: the patient was seen and examined on 12/17/2024  PCP: Antonette Angeline ORN, NP   Patient coming from: Home  I have personally briefly reviewed patient's old medical records in [facility]  CC: numbness right hand, right UE, right face HPI: 64 year old male with a history of prior stroke, seizure disorder, type 2 diabetes, history of complex migraines, chronic orthostatic hypotension, presents to the ER today with onset of right arm, hand, facial numbness starting about 5 or 530.  He got up around 4-4 30 this morning to use the bathroom.  He noticed while he was up, he started having sudden onset of numbness in his right upper extremity.  This moved up his arm to his face.  Patient went to bed without incident last night.  He took his Keppra  without incident.  He has had feelings like this before and sometimes it is related to a migraine headache.  He did take a Klonopin this morning.  After his numbness did not resolve, he came to the ER for evaluation.  On arrival temp 98 heart rate 77 blood pressure 103/71 satting 99% on room air.  White count 4.2, hemoglobin 14.5, platelets of 177  Sodium of 139, potassium 4.8, chloride 103, bicarb 27, BUN 22, creatinine 0.84, glucose 170  Total protein 7.0, albumin 4.5, AST of 18, ALT of 18, alk phos of 78, total 0.5  Alcohol less than 15  CT head without contrast demonstrated hyperdensity in the posterior left frontal and parietal cortex with associated subtle sulcal effacement most concerning for acute infarct with hemorrhagic conversion.  EDP has consulted neurology.  MRI has been ordered of the brain.  [facility] hospital[facility] consulted for admission. Significant Events: Admitted 12/17/2024   Admission Labs:   Admission Imaging Studies:   Significant Labs:   Significant Imaging Studies:   Antibiotic Therapy: Anti-infectives (From admission,  onward)    None       Procedures:   Consultants:     ED Course: CT head ?? Left frontal/parietal CVA with hemorrhagic transformation??  Review of Systems:  Review of Systems  Constitutional: Negative.   HENT: Negative.    Eyes: Negative.   Respiratory: Negative.    Cardiovascular: Negative.   Gastrointestinal: Negative.   Genitourinary: Negative.   Musculoskeletal: Negative.   Skin: Negative.   Neurological:  Positive for tingling.       Numbness started in right hand. Spread upwards into right UE and then into right side of face  Endo/Heme/Allergies: Negative.   Psychiatric/Behavioral: Negative.    All other systems reviewed and are negative.   Past Medical History:  Diagnosis Date   Allergy    Avascular necrosis of bones of both hips (HCC)    GERD (gastroesophageal reflux disease)    Migraine    Neutropenia    Osteoarthritis of both hips    Plantar fasciitis    Seizures (HCC)    x 2 03/2021   Stroke (HCC)    Trochanteric bursitis of right hip     Past Surgical History:  Procedure Laterality Date   HERNIA REPAIR     HIP SURGERY     IR ANGIO INTRA EXTRACRAN SEL COM CAROTID INNOMINATE BILAT MOD SED  04/08/2021   IR ANGIO VERTEBRAL SEL SUBCLAVIAN INNOMINATE BILAT MOD SED  04/08/2021   IR ANGIO VERTEBRAL SEL VERTEBRAL UNI R MOD SED  02/10/2023   IR CT HEAD LTD  03/30/2023   IR PERCUTANEOUS ART THROMBECTOMY/INFUSION INTRACRANIAL INC DIAG ANGIO  02/10/2023   IR US  GUIDE VASC ACCESS RIGHT  04/08/2021   RADIOLOGY WITH ANESTHESIA N/A 02/10/2023   Procedure: IR WITH ANESTHESIA;  Surgeon: Radiologist, Medication, MD;  Location: [facility] OR;  Service: Radiology;  Laterality: N/A;     reports that he quit smoking about 46 years ago. His smoking use included cigarettes. He has never been exposed to tobacco smoke. He has quit using smokeless tobacco.  His smokeless tobacco use included chew. He reports that he does not currently use alcohol. He reports that he does not use  drugs.  Allergies[1]  Family History  Problem Relation Age of Onset   Arthritis Mother    Stroke Mother    Diabetes Father    Heart disease Father    Hyperlipidemia Father    Hypertension Father    Kidney disease Maternal Grandfather    Colon cancer Neg Hx    Stomach cancer Neg Hx    Rectal cancer Neg Hx     Prior to Admission medications  Medication Sig Start Date End Date Taking? Authorizing Provider  aspirin  81 MG chewable tablet Chew 1 tablet (81 mg total) by mouth daily. 02/13/23   Judithe Rocky BROCKS, NP  atorvastatin  (LIPITOR) 40 MG tablet Take 1 tablet (40 mg total) by mouth daily. 12/13/24   Antonette Angeline ORN, NP  Blood Glucose Monitoring Suppl (ONETOUCH VERIO FLEX SYSTEM) w/Device KIT USED TO CHECK BLOOD SUGAR ONCE A DAY. DX E11.9 08/26/23   Antonette Angeline ORN, NP  clonazePAM (KLONOPIN) 1 MG tablet Take 1 mg by mouth 2 (two) times daily.    [provider]  glipiZIDE  (GLUCOTROL  XL) 5 MG 24 hr tablet Take 1 tablet (5 mg total) by mouth daily with breakfast. 12/16/24   Antonette Angeline ORN, NP  glucose blood (RELION GLUCOSE TEST STRIPS) test strip 1 each by Other route daily. One time daily 01/01/24   Antonette Angeline ORN, NP  Lancet Devices (RELION LANCING DEVICE) MISC Use to check blood sugar one a day.  DX: E11.9 08/22/22   Antonette Angeline ORN, NP  levETIRAcetam  (KEPPRA  XR) 750 MG 24 hr tablet Take 3,750 mg by mouth at bedtime. 10/07/24   [provider]  midodrine  (PROAMATINE ) 5 MG tablet TAKE 1 TABLET (5 MG TOTAL) BY MOUTH 2 (TWO) TIMES DAILY WITH A MEAL. 09/02/24   Antonette Angeline ORN, NP  multivitamin (RENA-VIT) TABS tablet Take 1 tablet by mouth daily.    [provider]  Rimegepant Sulfate (NURTEC) 75 MG TBDP TAKE 1 TAB AT ONSET OF MIGRAINE. MAY REPEAT IN 2 HRS, IF NEEDED. MAX DOSE: 2 TABS/DAY. 01/25/24   Antonette Angeline ORN, NP  Turmeric (QC TUMERIC COMPLEX PO) Take by mouth.    [provider]    Physical Exam: Vitals:   12/17/24 0624 12/17/24 0730 12/17/24 0731   BP: 103/71 102/66   Pulse: 77 (!) 58   Resp: 19 17   Temp:   98 F (36.7 C)  TempSrc:   Oral  SpO2: 99% 97%     Physical Exam Vitals and nursing note reviewed.  Constitutional:      General: He is not in acute distress.    Appearance: He is normal weight. He is not toxic-appearing or diaphoretic.  HENT:     Head: Normocephalic and atraumatic.     Nose: Nose normal.  Eyes:     General: No scleral icterus. Cardiovascular:     Rate and  Rhythm: Normal rate and regular rhythm.  Pulmonary:     Effort: Pulmonary effort is normal.     Breath sounds: Normal breath sounds.  Abdominal:     General: Abdomen is flat. Bowel sounds are normal. There is no distension.     Palpations: Abdomen is soft.  Musculoskeletal:     Right lower leg: No edema.     Left lower leg: No edema.  Skin:    General: Skin is warm and dry.     Capillary Refill: Capillary refill takes less than 2 seconds.  Neurological:     Mental Status: He is alert and oriented to person, place, and time.     Comments: Normal hand grip strength bilaterally  Able to lift legs off bed without difficulty and hold against gravity.      Labs on Admission: I have personally reviewed following labs and imaging studies  CBC: Recent Labs  Lab 12/17/24 0636 12/17/24 0641  WBC 4.2  --   NEUTROABS 2.3  --   HGB 14.5 13.9  HCT 41.5 41.0  MCV 84.5  --   PLT 177  --    Basic Metabolic Panel: Recent Labs  Lab 12/15/24 1611 12/17/24 0636 12/17/24 0641  NA 140 139 138  K 4.5 4.8 4.6  CL 102 103 102  CO2 29 27  --   GLUCOSE 113* 170* 168*  BUN 29* 22 24*  CREATININE 1.70* 0.84 0.90  CALCIUM  9.7 9.9  --    GFR: CrCl cannot be calculated (Unknown ideal weight.). Liver Function Tests: Recent Labs  Lab 12/17/24 0636  AST 18  ALT 18  ALKPHOS 78  BILITOT 0.5  PROT 7.0  ALBUMIN 4.5   Coagulation Profile: Recent Labs  Lab 12/17/24 0636  INR 1.0   CBG: Recent Labs  Lab 12/17/24 0641  GLUCAP 177*     Radiological Exams on Admission: I have personally reviewed images CT HEAD WO CONTRAST Result Date: 12/17/2024 EXAM: CT HEAD WITHOUT CONTRAST 12/17/2024 06:52:00 AM TECHNIQUE: CT of the head was performed without the administration of intravenous contrast. Automated exposure control, iterative reconstruction, and/or weight based adjustment of the mA/kV was utilized to reduce the radiation dose to as low as reasonably achievable. COMPARISON: CT angiogram of the head dated 04/29/2023. CLINICAL HISTORY: Acute neurological deficit; stroke suspected. FINDINGS: BRAIN AND VENTRICLES: Hyperdensity within posterior left frontal and parietal lobes, which appears to be involving the cerebral cortex. There is associated effacement of the cerebral sulci. No hydrocephalus. No extra-axial collection. ORBITS: No acute abnormality. SINUSES: No acute abnormality. SOFT TISSUES AND SKULL: No acute soft tissue abnormality. No skull fracture. IMPRESSION: 1. Hyperdensity within the posterior left frontal and parietal cortex with associated sulcal effacement, most concerning for acute infarct with hemorrhagic transformation; differential includes cortical subarachnoid hemorrhage, cortical venous thrombosis, and less likely contrast staining. Recommend correlation with MRI brain and CTA/CTV head/neck. Electronically signed by: Evalene Coho MD 12/17/2024 07:34 AM EST RP Workstation: [id number]    EKG: My personal interpretation of EKG shows: NSR      Assessment/Plan Principal Problem:   Numbness and tingling of right arm Active Problems:   Partial symptomatic epilepsy with complex partial seizures, not intractable, without status epilepticus (HCC)   Hyperlipidemia associated with type 2 diabetes mellitus (HCC)   Type 2 diabetes mellitus with hyperglycemia, without long-term current use of insulin (HCC)   Orthostatic hypotension    Assessment and Plan: * Numbness and tingling of right arm 12/17/24 Admit to  Observation telemetry  bed. Neurology consulted. MRI brian ordered. Check echo. Hold on ASA/lovenox /heparin /plavix  due to possible hemorrhagic conversion. Defer antiplatelet and chemical DVT prophylaxis decision to neurology.  Orthostatic hypotension 12/17/24 continue with midodrine . Hold if SBP >140   Type 2 diabetes mellitus with hyperglycemia, without long-term current use of insulin (HCC) 12/17/24 check A1c. Add SSI.   Hyperlipidemia associated with type 2 diabetes mellitus (HCC) 12/17/24 continue lipitor. Send lipid panel.   Partial symptomatic epilepsy with complex partial seizures, not intractable, without status epilepticus (HCC) 12/17/24 continue with keppra  3750 po at bedtime. Pt follow with outpatient [facility] neurology.    DVT prophylaxis: SCDs Code Status: Full Code Family Communication: discussed with pt and significant other [name]  Disposition Plan: return home  Consults called: neurology - [facility]  Admission status: Observation, Telemetry bed   Camellia Door, DO [facility] 12/17/2024, 8:57 AM       [1] No Active Allergies  "

## 2024-12-17 NOTE — Discharge Instructions (Signed)
 Follow up with PCP in 1-2 weeks.

## 2024-12-17 NOTE — ED Notes (Signed)
 Patient back form CT

## 2024-12-17 NOTE — Hospital Course (Addendum)
 CC: numbness right hand, right UE, right face HPI: 64 year old male with a history of prior stroke, seizure disorder, type 2 diabetes, history of complex migraines, chronic orthostatic hypotension, presents to the ER today with onset of right arm, hand, facial numbness starting about 5 or 530.  He got up around 4-4 30 this morning to use the bathroom.  He noticed while he was up, he started having sudden onset of numbness in his right upper extremity.  This moved up his arm to his face.  Patient went to bed without incident last night.  He took his Keppra  without incident.  He has had feelings like this before and sometimes it is related to a migraine headache.  He did take a Klonopin this morning.  After his numbness did not resolve, he came to the ER for evaluation.  On arrival temp 98 heart rate 77 blood pressure 103/71 satting 99% on room air.  White count 4.2, hemoglobin 14.5, platelets of 177  Sodium of 139, potassium 4.8, chloride 103, bicarb 27, BUN 22, creatinine 0.84, glucose 170  Total protein 7.0, albumin 4.5, AST of 18, ALT of 18, alk phos of 78, total 0.5  Alcohol less than 15  CT head without contrast demonstrated hyperdensity in the posterior left frontal and parietal cortex with associated subtle sulcal effacement most concerning for acute infarct with hemorrhagic conversion.  EDP has consulted neurology.  MRI has been ordered of the brain.  [facility] hospitalist consulted for admission. Significant Events: Admitted 12/17/2024   Admission Labs: White count 4.2, hemoglobin 14.5, platelets of 177  Sodium of 139, potassium 4.8, chloride 103, bicarb 27, BUN 22, creatinine 0.84, glucose 170  Total protein 7.0, albumin 4.5, AST of 18, ALT of 18, alk phos of 78, total 0.5  Alcohol less than 15  Admission Imaging Studies: CT head without contrast demonstrated hyperdensity in the posterior left frontal and parietal cortex with associated subtle sulcal effacement most  concerning for acute infarct with hemorrhagic conversion.  Significant Labs:   Significant Imaging Studies: CTA head/neck Prominent sulcal enhancement within the left frontal vertex, suggesting luxury perfusion. 2. Cerebral swelling with effacement of the left frontal sulci at the vertex. 3. No large vessel occlusion. MRI brain No restricted diffusion to indicate acute or recent infarction. 2. Moderate periventricular and deep cerebral white matter disease. 3. Status post left lens replacement  Antibiotic Therapy: Anti-infectives (From admission, onward)    None       Procedures:   Consultants:

## 2024-12-19 ENCOUNTER — Telehealth: Payer: Self-pay

## 2024-12-19 NOTE — Transitions of Care (Post Inpatient/ED Visit) (Signed)
 "  12/19/2024  Name: Ralph Simpson MRN: [id number] DOB: Apr 04, 1961  Today's TOC FU Call Status: Today's TOC FU Call Status:: Successful TOC FU Call Completed TOC FU Call Complete Date: 12/19/24  Patient's Name and Date of Birth confirmed. DOB, Name  Transition Care Management Follow-up Telephone Call Date of Discharge: 12/17/24 Discharge Facility: [facility]) Type of Discharge: Inpatient Admission Primary Inpatient Discharge Diagnosis:: Right Arm Numbness How have you been since you were released from the hospital?: Better Any questions or concerns?: No  Items Reviewed: Did you receive and understand the discharge instructions provided?: Yes Medications obtained,verified, and reconciled?: Yes (Medications Reviewed) Any new allergies since your discharge?: No Dietary orders reviewed?: NA Do you have support at home?: No  Medications Reviewed Today: Medications Reviewed Today     Reviewed by Lavelle Charmaine KATHEE, LPN (Licensed Practical Nurse) on 12/19/24 at 1013  Med List Status: <None>   Medication Order Taking? Sig Documenting Provider Last Dose Status Informant  aspirin  81 MG chewable tablet [id number] No Chew 1 tablet (81 mg total) by mouth daily. Judithe Rocky BROCKS, NP 12/17/2024 Morning Active Self, Pharmacy Records  atorvastatin  (LIPITOR) 40 MG tablet [id number] No Take 1 tablet (40 mg total) by mouth daily. Antonette Angeline ORN, NP 12/17/2024 Morning Active Self, Pharmacy Records  Blood Glucose Monitoring Suppl (ONETOUCH VERIO FLEX SYSTEM) w/Device KIT [id number]  USED TO CHECK BLOOD SUGAR ONCE A DAY. DX E11.9 Antonette Angeline ORN, NP  Active Self, Pharmacy Records  clonazePAM (KLONOPIN) 1 MG tablet [id number] No Take 1 mg by mouth 2 (two) times daily. [provider] 12/17/2024 Morning Active Self, Pharmacy Records           Med Note HUMBERTO, JHONNIE RAMAN   Fri Jul 31, 2023  9:00 AM) Takes PRN for seizures  COENZYME Q10 PO [id number] No Take 1 tablet by mouth daily. [provider] 12/17/2024 Morning Active Self, Pharmacy Records  glipiZIDE  (GLUCOTROL  XL) 5 MG 24 hr tablet [id number] No Take 1 tablet (5 mg total) by mouth daily with breakfast.  Patient not taking: Reported on 12/17/2024   Antonette Angeline ORN, NP Not Taking Active Self, Pharmacy Records           Med Note SANDIE KINDER J   Sat Dec 17, 2024  9:35 AM) Hasn't picked up   glucose blood (RELION GLUCOSE TEST STRIPS) test strip [id number]  1 each by Other route daily. One time daily Antonette Angeline ORN, NP  Active Self, Pharmacy Records  guaifenesin (ROBITUSSIN) 100 MG/5ML syrup [id number] No Take 200 mg by mouth 3 (three) times daily as needed for cough. [provider] Past Week Active Self, Pharmacy Records  Lancet Devices (RELION LANCING DEVICE) MISC [id number]  Use to check blood sugar one a day.  DX: E11.9 Antonette Angeline ORN, NP  Active Self, Pharmacy Records  levETIRAcetam  (KEPPRA  XR) 750 MG 24 hr tablet [id number] No Take 3,750 mg by mouth at bedtime. [provider] 12/16/2024 Active Self, Pharmacy Records  metFORMIN  (GLUCOPHAGE -XR) 500 MG 24 hr tablet [id number] No Take 1,000 mg by mouth 2 (two) times daily. [provider] 12/17/2024 Morning Active Self, Pharmacy Records  midodrine  (PROAMATINE ) 5 MG tablet [id number] No TAKE 1 TABLET (5 MG TOTAL) BY MOUTH 2 (TWO) TIMES DAILY WITH A MEAL.  Patient taking differently: Take 5 mg by mouth daily.   Antonette Angeline ORN, NP 12/17/2024 Morning Active Self, Pharmacy Records  OVER THE COUNTER MEDICATION [id number] No Take 1 tablet by mouth daily. Renafood  by Standard Process [provider] 12/17/2024 Morning Active Self, Pharmacy Records  Rimegepant Sulfate (NURTEC) 75 MG TBDP [id number] No TAKE 1 TAB AT ONSET OF MIGRAINE. MAY REPEAT IN 2 HRS, IF NEEDED. MAX DOSE: 2 TABS/DAY. Antonette Angeline ORN, NP Unknown Active Self, Pharmacy Records  Turmeric (QC TUMERIC COMPLEX PO) [id number] No Take 1 tablet by mouth daily as needed (inflammation). [provider] Past Week Active Self, Pharmacy Records  Vitamin D-Vitamin K (VITAMIN K2-VITAMIN D3 PO) [id number] No Take 1 tablet by mouth daily. [provider] 12/17/2024 Morning Active Self, Pharmacy Records            Home Care and Equipment/Supplies: Were Home Health Services Ordered?: NA Any new equipment or medical supplies ordered?: NA  Functional Questionnaire: Do you need assistance with bathing/showering or dressing?: No Do you need assistance with meal preparation?: No Do you need assistance with eating?: No Do you have difficulty maintaining continence: No Do you need assistance with getting out of bed/getting out of a chair/moving?: No Do you have difficulty managing or taking your medications?: No  Follow up appointments reviewed: PCP Follow-up appointment confirmed?: No (patient declined) Specialist Hospital Follow-up appointment confirmed?: NA Do you need transportation to your follow-up appointment?: No Do you understand care options if your condition(s) worsen?: Yes-patient verbalized understanding    SIGNATURE Charmaine Bloodgood, LPN [facility] Health Advisor [facility] You Are. We Are. [facility] Direct Dial [phone]  "

## 2024-12-19 NOTE — Telephone Encounter (Signed)
 Will discuss at upcoming appt.

## 2024-12-22 ENCOUNTER — Telehealth: Payer: Self-pay

## 2024-12-22 NOTE — Telephone Encounter (Signed)
 LMTCB to schedule appt with Angeline. Okay to schedule if patient calls back.

## 2024-12-22 NOTE — Telephone Encounter (Signed)
 Please have him schedule an appointment for ED follow-up

## 2024-12-22 NOTE — Telephone Encounter (Signed)
 Copied from CRM [id number]. Topic: Clinical - Lab/Test Results >> Dec 21, 2024  3:45 PM Edsel HERO wrote: Patient would like to discuss his lab results further. Please advise.

## 2024-12-23 ENCOUNTER — Encounter

## 2024-12-26 ENCOUNTER — Encounter

## 2025-01-23 ENCOUNTER — Encounter

## 2025-01-26 ENCOUNTER — Encounter

## 2025-02-23 ENCOUNTER — Encounter

## 2025-02-26 ENCOUNTER — Encounter

## 2025-03-13 ENCOUNTER — Ambulatory Visit: Admitting: Internal Medicine

## 2025-03-17 ENCOUNTER — Ambulatory Visit: Admitting: Internal Medicine

## 2025-08-18 ENCOUNTER — Ambulatory Visit
